# Patient Record
Sex: Female | Born: 1955 | Race: White | Hispanic: No | Smoking: Current some day smoker
Health system: Southern US, Community
[De-identification: ages and names within clinical notes are randomized; demographics above are authoritative.]

## PROBLEM LIST (undated history)

## (undated) DIAGNOSIS — I480 Paroxysmal atrial fibrillation: Secondary | ICD-10-CM

## (undated) DIAGNOSIS — K000 Anodontia: Secondary | ICD-10-CM

## (undated) DIAGNOSIS — I214 Non-ST elevation (NSTEMI) myocardial infarction: Secondary | ICD-10-CM

## (undated) DIAGNOSIS — J432 Centrilobular emphysema: Secondary | ICD-10-CM

## (undated) DIAGNOSIS — I10 Essential (primary) hypertension: Secondary | ICD-10-CM

## (undated) DIAGNOSIS — I255 Ischemic cardiomyopathy: Secondary | ICD-10-CM

## (undated) DIAGNOSIS — E785 Hyperlipidemia, unspecified: Secondary | ICD-10-CM

## (undated) DIAGNOSIS — IMO0002 Reserved for concepts with insufficient information to code with codable children: Secondary | ICD-10-CM

## (undated) DIAGNOSIS — K08109 Complete loss of teeth, unspecified cause, unspecified class: Secondary | ICD-10-CM

## (undated) DIAGNOSIS — M1990 Unspecified osteoarthritis, unspecified site: Secondary | ICD-10-CM

## (undated) HISTORY — PX: KNEE ARTHROSCOPY: SUR90

## (undated) HISTORY — DX: Non-ST elevation (NSTEMI) myocardial infarction: I21.4

## (undated) HISTORY — PX: TONSILLECTOMY: SUR1361

## (undated) HISTORY — DX: Paroxysmal atrial fibrillation: I48.0

## (undated) HISTORY — DX: Hyperlipidemia, unspecified: E78.5

## (undated) HISTORY — DX: Ischemic cardiomyopathy: I25.5

---

## 2015-11-15 ENCOUNTER — Other Ambulatory Visit (HOSPITAL_COMMUNITY): Payer: Self-pay | Admitting: *Deleted

## 2015-11-15 DIAGNOSIS — R221 Localized swelling, mass and lump, neck: Secondary | ICD-10-CM

## 2015-11-18 ENCOUNTER — Ambulatory Visit (HOSPITAL_COMMUNITY)
Admission: RE | Admit: 2015-11-18 | Discharge: 2015-11-18 | Disposition: A | Discharge status: Home / Self Care | Payer: Self-pay | Source: Ambulatory Visit | Attending: *Deleted | Admitting: *Deleted

## 2015-11-18 DIAGNOSIS — R221 Localized swelling, mass and lump, neck: Secondary | ICD-10-CM | POA: Insufficient documentation

## 2015-12-09 ENCOUNTER — Other Ambulatory Visit (INDEPENDENT_AMBULATORY_CARE_PROVIDER_SITE_OTHER): Payer: Self-pay | Admitting: Otolaryngology

## 2015-12-09 ENCOUNTER — Other Ambulatory Visit (HOSPITAL_COMMUNITY)
Admission: RE | Admit: 2015-12-09 | Discharge: 2015-12-09 | Disposition: A | Discharge status: Home / Self Care | Payer: Medicaid Other | Source: Ambulatory Visit | Attending: Otolaryngology | Admitting: Otolaryngology

## 2015-12-09 ENCOUNTER — Ambulatory Visit (INDEPENDENT_AMBULATORY_CARE_PROVIDER_SITE_OTHER): Payer: Medicaid Other | Admitting: Otolaryngology

## 2015-12-09 DIAGNOSIS — R221 Localized swelling, mass and lump, neck: Secondary | ICD-10-CM

## 2015-12-09 DIAGNOSIS — D487 Neoplasm of uncertain behavior of other specified sites: Secondary | ICD-10-CM

## 2015-12-16 ENCOUNTER — Ambulatory Visit (HOSPITAL_COMMUNITY): Payer: Self-pay

## 2015-12-17 ENCOUNTER — Encounter (HOSPITAL_BASED_OUTPATIENT_CLINIC_OR_DEPARTMENT_OTHER): Payer: Self-pay | Admitting: *Deleted

## 2015-12-17 ENCOUNTER — Other Ambulatory Visit: Payer: Self-pay | Admitting: Otolaryngology

## 2015-12-17 ENCOUNTER — Ambulatory Visit (HOSPITAL_COMMUNITY)
Admission: RE | Admit: 2015-12-17 | Discharge: 2015-12-17 | Disposition: A | Discharge status: Home / Self Care | Payer: Medicaid Other | Source: Ambulatory Visit | Attending: Otolaryngology | Admitting: Otolaryngology

## 2015-12-17 DIAGNOSIS — J432 Centrilobular emphysema: Secondary | ICD-10-CM | POA: Diagnosis not present

## 2015-12-17 DIAGNOSIS — R221 Localized swelling, mass and lump, neck: Secondary | ICD-10-CM | POA: Insufficient documentation

## 2015-12-17 DIAGNOSIS — R918 Other nonspecific abnormal finding of lung field: Secondary | ICD-10-CM | POA: Insufficient documentation

## 2015-12-17 DIAGNOSIS — I82C11 Acute embolism and thrombosis of right internal jugular vein: Secondary | ICD-10-CM | POA: Diagnosis not present

## 2015-12-17 MED ORDER — IOHEXOL 300 MG/ML  SOLN
75.0000 mL | Freq: Once | INTRAMUSCULAR | Status: AC | PRN
  Administered 2015-12-17: 75 mL via INTRAVENOUS

## 2015-12-20 ENCOUNTER — Ambulatory Visit (HOSPITAL_COMMUNITY): Payer: Self-pay

## 2015-12-21 ENCOUNTER — Encounter (HOSPITAL_BASED_OUTPATIENT_CLINIC_OR_DEPARTMENT_OTHER)
Admission: RE | Disposition: A | Discharge status: Home / Self Care | Payer: Self-pay | Source: Ambulatory Visit | Attending: Otolaryngology

## 2015-12-21 ENCOUNTER — Ambulatory Visit (HOSPITAL_BASED_OUTPATIENT_CLINIC_OR_DEPARTMENT_OTHER): Payer: Medicaid Other | Admitting: Anesthesiology

## 2015-12-21 ENCOUNTER — Encounter (HOSPITAL_BASED_OUTPATIENT_CLINIC_OR_DEPARTMENT_OTHER): Payer: Self-pay | Admitting: *Deleted

## 2015-12-21 ENCOUNTER — Ambulatory Visit (HOSPITAL_BASED_OUTPATIENT_CLINIC_OR_DEPARTMENT_OTHER)
Admission: RE | Admit: 2015-12-21 | Discharge: 2015-12-21 | Disposition: A | Discharge status: Home / Self Care | Payer: Medicaid Other | Source: Ambulatory Visit | Attending: Otolaryngology | Admitting: Otolaryngology

## 2015-12-21 DIAGNOSIS — F17210 Nicotine dependence, cigarettes, uncomplicated: Secondary | ICD-10-CM | POA: Insufficient documentation

## 2015-12-21 DIAGNOSIS — I10 Essential (primary) hypertension: Secondary | ICD-10-CM | POA: Diagnosis not present

## 2015-12-21 DIAGNOSIS — C760 Malignant neoplasm of head, face and neck: Secondary | ICD-10-CM | POA: Diagnosis not present

## 2015-12-21 DIAGNOSIS — C770 Secondary and unspecified malignant neoplasm of lymph nodes of head, face and neck: Secondary | ICD-10-CM | POA: Diagnosis not present

## 2015-12-21 DIAGNOSIS — E041 Nontoxic single thyroid nodule: Secondary | ICD-10-CM | POA: Insufficient documentation

## 2015-12-21 DIAGNOSIS — R221 Localized swelling, mass and lump, neck: Secondary | ICD-10-CM | POA: Diagnosis present

## 2015-12-21 HISTORY — DX: Unspecified osteoarthritis, unspecified site: M19.90

## 2015-12-21 HISTORY — PX: PANENDOSCOPY: SHX2159

## 2015-12-21 HISTORY — PX: MASS BIOPSY: SHX5445

## 2015-12-21 SURGERY — BIOPSY, MASS, NECK
Anesthesia: General | Site: Throat | Laterality: Right

## 2015-12-21 MED ORDER — SUCCINYLCHOLINE CHLORIDE 20 MG/ML IJ SOLN
INTRAMUSCULAR | Status: DC | PRN
  Administered 2015-12-21: 100 mg via INTRAVENOUS

## 2015-12-21 MED ORDER — EPINEPHRINE HCL 1 MG/ML IJ SOLN
INTRAMUSCULAR | Status: AC
  Filled 2015-12-21: qty 1

## 2015-12-21 MED ORDER — LIDOCAINE-EPINEPHRINE 1 %-1:100000 IJ SOLN
INTRAMUSCULAR | Status: DC | PRN
  Administered 2015-12-21: 2 mL

## 2015-12-21 MED ORDER — LACTATED RINGERS IV SOLN
INTRAVENOUS | Status: DC
  Administered 2015-12-21 (×2): via INTRAVENOUS

## 2015-12-21 MED ORDER — DEXAMETHASONE SODIUM PHOSPHATE 4 MG/ML IJ SOLN
INTRAMUSCULAR | Status: DC | PRN
  Administered 2015-12-21 (×2): 10 mg via INTRAVENOUS

## 2015-12-21 MED ORDER — PROPOFOL 10 MG/ML IV BOLUS
INTRAVENOUS | Status: AC
  Filled 2015-12-21: qty 20

## 2015-12-21 MED ORDER — HEMOSTATIC AGENTS (NO CHARGE) OPTIME
TOPICAL | Status: DC | PRN
  Administered 2015-12-21: 1 via TOPICAL

## 2015-12-21 MED ORDER — ONDANSETRON HCL 4 MG/2ML IJ SOLN
INTRAMUSCULAR | Status: AC
  Filled 2015-12-21: qty 2

## 2015-12-21 MED ORDER — ONDANSETRON HCL 4 MG/2ML IJ SOLN
INTRAMUSCULAR | Status: DC | PRN
  Administered 2015-12-21: 4 mg via INTRAVENOUS

## 2015-12-21 MED ORDER — LIDOCAINE HCL (CARDIAC) 20 MG/ML IV SOLN
INTRAVENOUS | Status: AC
  Filled 2015-12-21: qty 10

## 2015-12-21 MED ORDER — FENTANYL CITRATE (PF) 100 MCG/2ML IJ SOLN
50.0000 ug | INTRAMUSCULAR | Status: DC | PRN
  Administered 2015-12-21: 100 ug via INTRAVENOUS

## 2015-12-21 MED ORDER — CEFAZOLIN SODIUM-DEXTROSE 2-3 GM-% IV SOLR
INTRAVENOUS | Status: DC | PRN
  Administered 2015-12-21: 2 g via INTRAVENOUS

## 2015-12-21 MED ORDER — PROPOFOL 10 MG/ML IV BOLUS
INTRAVENOUS | Status: DC | PRN
  Administered 2015-12-21: 150 mg via INTRAVENOUS
  Administered 2015-12-21: 50 mg via INTRAVENOUS

## 2015-12-21 MED ORDER — FENTANYL CITRATE (PF) 100 MCG/2ML IJ SOLN
INTRAMUSCULAR | Status: AC
  Filled 2015-12-21: qty 2

## 2015-12-21 MED ORDER — LIDOCAINE HCL (CARDIAC) 20 MG/ML IV SOLN
INTRAVENOUS | Status: DC | PRN
  Administered 2015-12-21: 80 mg via INTRAVENOUS

## 2015-12-21 MED ORDER — HYDROCODONE-ACETAMINOPHEN 7.5-325 MG PO TABS: 1.0000 | ORAL_TABLET | Freq: Once | ORAL | Status: DC | PRN

## 2015-12-21 MED ORDER — DEXAMETHASONE SODIUM PHOSPHATE 10 MG/ML IJ SOLN
INTRAMUSCULAR | Status: AC
  Filled 2015-12-21: qty 1

## 2015-12-21 MED ORDER — PHENYLEPHRINE HCL 10 MG/ML IJ SOLN
INTRAMUSCULAR | Status: DC | PRN
  Administered 2015-12-21 (×4): 80 ug via INTRAVENOUS

## 2015-12-21 MED ORDER — MIDAZOLAM HCL 2 MG/2ML IJ SOLN
INTRAMUSCULAR | Status: AC
  Filled 2015-12-21: qty 2

## 2015-12-21 MED ORDER — OXYCODONE-ACETAMINOPHEN 5-325 MG PO TABS: 1.0000 | ORAL_TABLET | ORAL | Status: DC | PRN

## 2015-12-21 MED ORDER — GLYCOPYRROLATE 0.2 MG/ML IJ SOLN: 0.2000 mg | Freq: Once | INTRAMUSCULAR | Status: DC | PRN

## 2015-12-21 MED ORDER — EPHEDRINE SULFATE 50 MG/ML IJ SOLN
INTRAMUSCULAR | Status: DC | PRN
  Administered 2015-12-21 (×2): 10 mg via INTRAVENOUS

## 2015-12-21 MED ORDER — PROMETHAZINE HCL 25 MG/ML IJ SOLN: 6.2500 mg | INTRAMUSCULAR | Status: DC | PRN

## 2015-12-21 MED ORDER — SCOPOLAMINE 1 MG/3DAYS TD PT72: 1.0000 | MEDICATED_PATCH | Freq: Once | TRANSDERMAL | Status: DC | PRN

## 2015-12-21 MED ORDER — FENTANYL CITRATE (PF) 100 MCG/2ML IJ SOLN: 25.0000 ug | INTRAMUSCULAR | Status: DC | PRN

## 2015-12-21 MED ORDER — MIDAZOLAM HCL 2 MG/2ML IJ SOLN
1.0000 mg | INTRAMUSCULAR | Status: DC | PRN
  Administered 2015-12-21: 2 mg via INTRAVENOUS

## 2015-12-21 SURGICAL SUPPLY — 78 items
ADAPTER TUBE FLEX ULTRASET (MISCELLANEOUS) ×4 IMPLANT
BENZOIN TINCTURE PRP APPL 2/3 (GAUZE/BANDAGES/DRESSINGS) IMPLANT
BLADE SURG 15 STRL LF DISP TIS (BLADE) ×2 IMPLANT
BLADE SURG 15 STRL SS (BLADE) ×2
CANISTER SUCT 1200ML W/VALVE (MISCELLANEOUS) IMPLANT
CLEANER CAUTERY TIP 5X5 PAD (MISCELLANEOUS) IMPLANT
CLIP TI MEDIUM 6 (CLIP) IMPLANT
CLIP TI WIDE RED SMALL 6 (CLIP) IMPLANT
CLOSURE WOUND 1/4X4 (GAUZE/BANDAGES/DRESSINGS)
CORDS BIPOLAR (ELECTRODE) IMPLANT
COVER BACK TABLE 60X90IN (DRAPES) ×4 IMPLANT
COVER MAYO STAND STRL (DRAPES) ×4 IMPLANT
DECANTER SPIKE VIAL GLASS SM (MISCELLANEOUS) IMPLANT
DRAIN JACKSON RD 7FR 3/32 (WOUND CARE) IMPLANT
DRAIN PENROSE 1/4X12 LTX STRL (WOUND CARE) IMPLANT
DRAIN TLS ROUND 10FR (DRAIN) IMPLANT
DRAPE U-SHAPE 76X120 STRL (DRAPES) ×4 IMPLANT
ELECT COATED BLADE 2.86 ST (ELECTRODE) ×4 IMPLANT
ELECT NEEDLE BLADE 2-5/6 (NEEDLE) IMPLANT
ELECT PAIRED SUBDERMAL (MISCELLANEOUS)
ELECT REM PT RETURN 9FT ADLT (ELECTROSURGICAL) ×4
ELECTRODE PAIRED SUBDERMAL (MISCELLANEOUS) IMPLANT
ELECTRODE REM PT RTRN 9FT ADLT (ELECTROSURGICAL) ×2 IMPLANT
EVACUATOR SILICONE 100CC (DRAIN) IMPLANT
FORCEPS BIPOLAR SPETZLER 8 1.0 (NEUROSURGERY SUPPLIES) IMPLANT
GAUZE SPONGE 4X4 16PLY XRAY LF (GAUZE/BANDAGES/DRESSINGS) IMPLANT
GLOVE BIO SURGEON STRL SZ7.5 (GLOVE) ×4 IMPLANT
GLOVE BIOGEL PI IND STRL 7.5 (GLOVE) ×2 IMPLANT
GLOVE BIOGEL PI INDICATOR 7.5 (GLOVE) ×2
GLOVE SURG SS PI 7.0 STRL IVOR (GLOVE) ×12 IMPLANT
GLOVE SURG SS PI 7.5 STRL IVOR (GLOVE) ×4 IMPLANT
GOWN STRL REUS W/ TWL LRG LVL3 (GOWN DISPOSABLE) ×8 IMPLANT
GOWN STRL REUS W/TWL LRG LVL3 (GOWN DISPOSABLE) ×8
GUARD TEETH (MISCELLANEOUS) ×4 IMPLANT
HEMOSTAT SURGICEL .5X2 ABSORB (HEMOSTASIS) ×4 IMPLANT
LIQUID BAND (GAUZE/BANDAGES/DRESSINGS) ×4 IMPLANT
LOCATOR NERVE 3 VOLT (DISPOSABLE) IMPLANT
MARKER SKIN DUAL TIP RULER LAB (MISCELLANEOUS) ×4 IMPLANT
NEEDLE HYPO 18GX1.5 BLUNT FILL (NEEDLE) IMPLANT
NEEDLE HYPO 25X1 1.5 SAFETY (NEEDLE) ×4 IMPLANT
NEEDLE PRECISIONGLIDE 27X1.5 (NEEDLE) IMPLANT
NS IRRIG 1000ML POUR BTL (IV SOLUTION) ×4 IMPLANT
PACK BASIN DAY SURGERY FS (CUSTOM PROCEDURE TRAY) ×4 IMPLANT
PAD CLEANER CAUTERY TIP 5X5 (MISCELLANEOUS)
PATTIES SURGICAL .5 X3 (DISPOSABLE) IMPLANT
PENCIL BUTTON HOLSTER BLD 10FT (ELECTRODE) ×4 IMPLANT
PIN SAFETY STERILE (MISCELLANEOUS) IMPLANT
PROBE NERVBE PRASS .33 (MISCELLANEOUS) IMPLANT
SHEARS HARMONIC 9CM CVD (BLADE) IMPLANT
SHEET MEDIUM DRAPE 40X70 STRL (DRAPES) ×4 IMPLANT
SLEEVE SCD COMPRESS KNEE MED (MISCELLANEOUS) ×4 IMPLANT
SOLUTION BUTLER CLEAR DIP (MISCELLANEOUS) ×4 IMPLANT
SPONGE GAUZE 2X2 8PLY STER LF (GAUZE/BANDAGES/DRESSINGS)
SPONGE GAUZE 2X2 8PLY STRL LF (GAUZE/BANDAGES/DRESSINGS) IMPLANT
SPONGE GAUZE 4X4 12PLY STER LF (GAUZE/BANDAGES/DRESSINGS) ×4 IMPLANT
STAPLER VISISTAT 35W (STAPLE) IMPLANT
STRIP CLOSURE SKIN 1/4X4 (GAUZE/BANDAGES/DRESSINGS) IMPLANT
SUCTION FRAZIER HANDLE 10FR (MISCELLANEOUS)
SUCTION TUBE FRAZIER 10FR DISP (MISCELLANEOUS) IMPLANT
SUT ETHILON 3 0 PS 1 (SUTURE) IMPLANT
SUT ETHILON 4 0 PS 2 18 (SUTURE) IMPLANT
SUT PROLENE 5 0 P 3 (SUTURE) IMPLANT
SUT SILK 3 0 TIES 17X18 (SUTURE)
SUT SILK 3-0 18XBRD TIE BLK (SUTURE) IMPLANT
SUT SILK 4 0 TIES 17X18 (SUTURE) IMPLANT
SUT VIC AB 3-0 FS2 27 (SUTURE) IMPLANT
SUT VIC AB 4-0 P-3 18XBRD (SUTURE) IMPLANT
SUT VIC AB 4-0 P3 18 (SUTURE)
SUT VIC AB 4-0 RB1 27 (SUTURE)
SUT VIC AB 4-0 RB1 27X BRD (SUTURE) IMPLANT
SUT VICRYL 4-0 PS2 18IN ABS (SUTURE) ×4 IMPLANT
SYR BULB 3OZ (MISCELLANEOUS) ×4 IMPLANT
SYR CONTROL 10ML LL (SYRINGE) ×4 IMPLANT
SYR TB 1ML LL NO SAFETY (SYRINGE) IMPLANT
TOWEL OR 17X24 6PK STRL BLUE (TOWEL DISPOSABLE) ×8 IMPLANT
TRAY DSU PREP LF (CUSTOM PROCEDURE TRAY) ×4 IMPLANT
TUBE CONNECTING 20'X1/4 (TUBING) ×1
TUBE CONNECTING 20X1/4 (TUBING) ×3 IMPLANT

## 2015-12-21 NOTE — Anesthesia Postprocedure Evaluation (Signed)
Anesthesia Post Note  Patient: Gina Frederick  Procedure(s) Performed: Procedure(s) (LRB): NECK MASS BIOPSY (Right) PANENDOSCOPY (N/A)  Patient location during evaluation: PACU Anesthesia Type: General Level of consciousness: awake and alert Pain management: pain level controlled Vital Signs Assessment: post-procedure vital signs reviewed and stable Respiratory status: spontaneous breathing, nonlabored ventilation and respiratory function stable Cardiovascular status: blood pressure returned to baseline and stable Postop Assessment: no signs of nausea or vomiting Anesthetic complications: no    Last Vitals:  Filed Vitals:   12/21/15 1225 12/21/15 1248  BP:  140/86  Pulse: 99 109  Temp:  36.5 C  Resp: 22 20    Last Pain: There were no vitals filed for this visit.               Korah Hufstedler A

## 2015-12-21 NOTE — H&P (Signed)
Cc: Large neck mass  HPI: The patient is a 60 y/o female who presents today for evaluation of a large right neck mass. The patient is seen in consultation requested by [facility] Department. The patient first noticed swelling along the right side of neck in November. The area has continued to increase in size since that time. The patient denies any pain but states the area tingles. She notes extreme fatigue but denies any weight loss. She is eating and drinking without difficulty. The patient has a 10 year history of tobacco use. She previously had a tonsillectomy. The patient had a neck ultrasound which showed a multilobulated soft tissue mass measuring 7.3cm.  The patient's review of systems (constitutional, eyes, ENT, cardiovascular, respiratory, GI, musculoskeletal, skin, neurologic, psychiatric, endocrine, hematologic, allergic) is noted in the ROS questionnaire.  It is reviewed with the patient.    Family health history: None.   Major events: Tonsillectomy, C-section,    .   Ongoing medical problems: Thyroid nodule, hypertension.   Social history: The patient is single. She denies the use of alcohol or illegal drug.She smokes 3 cigarettes a day.  Exam General: Communicates without difficulty, well nourished, no acute distress.. Head: Normocephalic, no evidence injury, no tenderness, facial buttresses intact without stepoff.. Eyes: No scleral icterus, conjunctivae clear.. Neuro: CN II exam reveals vision grossly intact.  No nystagmus at any point of gaze.. Ears: Auricles well formed without lesions.  Ear canals are intact without mass or lesion.  No erythema or edema is appreciated.  The TM is intact without fluid.. Nose: External evaluation reveals normal support and skin without lesions.  Dorsum is intact.  Anterior rhinoscopy reveals healthy pink mucosa over anterior aspect of inferior turbinates and intact septum.  No purulence noted. Oral:  Oral cavity and oropharynx are  intact, symmetric, without erythema or edema. Multiple dental caries. Mucosa is moist without lesions. Neck: Full range of motion without pain.  Large firm right neck mass is noted. Thyroid bed within normal limits to palpation.  Parotid glands and submandibular glands equal bilaterally without mass.  Trachea is midline.Neuro:  CN 2-12 grossly intact. Gait normal. Vestibular: No nystagmus at any point of gaze. The cerebellar examination is unremarkable.   Procedure:  Flexible Fiberoptic Laryngoscopy -- Risks, benefits, and alternatives of flexible endoscopy were explained to the patient.  Specific mention was made of the risk of throat numbness with difficulty swallowing, possible bleeding from the nose and mouth, and pain from the procedure.  The patient gave oral consent to proceed.  The nasal cavities were decongested and anesthetised with a combination of oxymetazoline and 4% lidocaine solution.  The flexible scope was inserted into the right nasal cavity and advanced towards the nasopharynx.  Visualized mucosa over the turbinates and septum were as described above.  Prominence was noted along the right side of the submucosa of the pharynx.  Oropharyngeal walls were without lesion, ulcer, or edema.  Hypopharynx was also without  lesion or edema.  Larynx was mobile without lesions. Supraglottic structures were free of edema, mass, and asymmetry.  True vocal folds were white without mass or lesion.  Base of tongue was within normal limits.  The patient tolerated the procedure well.   Procedure: Fine needle aspiration biopsy of the right neck mass. Indication: To obtain biopsy of the patient's large right neck mass Anesthesia: Local anesthesia with 1% lidocaine. Description: The patient is placed supine on the operating table. An approximately  firm mass is noted at the  right lateral neck. 1% lidocaine with 1:100,000 epinephrine is infused subcutaneously over the mass. After adequate local anesthesia is  achieved, a 18 gauge needle is used to make multiple passes through the mass.  The aspirated specimen is spread over four glass slides.  The slides are sent to the pathology department for histologic identification. The patient tolerated the procedure well.  Assessment 1.  Large firm right neck mass  which is noted to extend into the submucosa of the pharynx on laryngoscopy exam.  2.  No other suspicious mass or lesion is noted on today's fiberoptic laryngoscopy exam.  Plan  1.  The physical exam and laryngoscopy findings are reviewed with the patient.  2.  Fine needle aspiration is performed without difficulty.  3.  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 Plan open biopsy for definitive tissue diagnosis. Will also perform pan-endoscopy in OR.

## 2015-12-21 NOTE — Anesthesia Procedure Notes (Signed)
Procedure Name: Intubation Date/Time: 12/21/2015 10:58 AM Performed by: Maryella Shivers Pre-anesthesia Checklist: Patient identified, Emergency Drugs available, Suction available and Patient being monitored Patient Re-evaluated:Patient Re-evaluated prior to inductionOxygen Delivery Method: Circle System Utilized Preoxygenation: Pre-oxygenation with 100% oxygen Intubation Type: IV induction Ventilation: Mask ventilation without difficulty Grade View: Grade III Tube type: Oral Tube size: 6.5 mm Number of attempts: 1 Airway Equipment and Method: Stylet and Oral airway Placement Confirmation: ETT inserted through vocal cords under direct vision,  positive ETCO2 and breath sounds checked- equal and bilateral Secured at: 20 cm Tube secured with: Tape Dental Injury: Teeth and Oropharynx as per pre-operative assessment  Comments: Trachea deviated left

## 2015-12-21 NOTE — Discharge Instructions (Addendum)
The patient may resume her previous activities and diet. The patient will follow up in my [place] office in one week.    Post Anesthesia Home Care Instructions  Activity: Get plenty of rest for the remainder of the day. A responsible adult should stay with you for 24 hours following the procedure.  For the next 24 hours, DO NOT: -Drive a car -Paediatric nurse -Drink alcoholic beverages -Take any medication unless instructed by your physician -Make any legal decisions or sign important papers.  Meals: Start with liquid foods such as gelatin or soup. Progress to regular foods as tolerated. Avoid greasy, spicy, heavy foods. If nausea and/or vomiting occur, drink only clear liquids until the nausea and/or vomiting subsides. Call your physician if vomiting continues.  Special Instructions/Symptoms: Your throat may feel dry or sore from the anesthesia or the breathing tube placed in your throat during surgery. If this causes discomfort, gargle with warm salt water. The discomfort should disappear within 24 hours.  If you had a scopolamine patch placed behind your ear for the management of post- operative nausea and/or vomiting:  1. The medication in the patch is effective for 72 hours, after which it should be removed.  Wrap patch in a tissue and discard in the trash. Wash hands thoroughly with soap and water. 2. You may remove the patch earlier than 72 hours if you experience unpleasant side effects which may include dry mouth, dizziness or visual disturbances. 3. Avoid touching the patch. Wash your hands with soap and water after contact with the patch.

## 2015-12-21 NOTE — Transfer of Care (Signed)
Immediate Anesthesia Transfer of Care Note  Patient: Gina Frederick  Procedure(s) Performed: Procedure(s): NECK MASS BIOPSY (Right) PANENDOSCOPY (N/A)  Patient Location: PACU  Anesthesia Type:General  Level of Consciousness: awake, alert  and oriented  Airway & Oxygen Therapy: Patient Spontanous Breathing and Patient connected to face mask oxygen  Post-op Assessment: Report given to RN and Post -op Vital signs reviewed and stable  Post vital signs: Reviewed and stable  Last Vitals:  Filed Vitals:   12/21/15 0932  BP: 132/76  Pulse: 107  Temp: 36.7 C  Resp: 20    Complications: No apparent anesthesia complications

## 2015-12-21 NOTE — Anesthesia Preprocedure Evaluation (Addendum)
Anesthesia Evaluation  Patient identified by MRN, date of birth, ID band Patient awake    Reviewed: Allergy & Precautions, H&P , NPO status , Patient's Chart, lab work & pertinent test results  History of Anesthesia Complications Negative for: history of anesthetic complications  Airway Mallampati: II  TM Distance: >3 FB Neck ROM: full    Dental no notable dental hx. (+) Poor Dentition, Dental Advisory Given   Pulmonary Current Smoker,    Pulmonary exam normal breath sounds clear to auscultation       Cardiovascular hypertension, Pt. on medications Normal cardiovascular exam Rhythm:regular Rate:Normal     Neuro/Psych negative neurological ROS     GI/Hepatic negative GI ROS, Neg liver ROS,   Endo/Other  negative endocrine ROS  Renal/GU negative Renal ROS     Musculoskeletal  (+) Arthritis ,   Abdominal   Peds  Hematology negative hematology ROS (+)   Anesthesia Other Findings   Reproductive/Obstetrics negative OB ROS                            Anesthesia Physical Anesthesia Plan  ASA: II  Anesthesia Plan: General   Post-op Pain Management:    Induction: Intravenous  Airway Management Planned: Oral ETT  Additional Equipment:   Intra-op Plan:   Post-operative Plan: Extubation in OR  Informed Consent: I have reviewed the patients History and Physical, chart, labs and discussed the procedure including the risks, benefits and alternatives for the proposed anesthesia with the patient or authorized representative who has indicated his/her understanding and acceptance.   Dental Advisory Given  Plan Discussed with: Anesthesiologist, CRNA and Surgeon  Anesthesia Plan Comments:         Anesthesia Quick Evaluation

## 2015-12-21 NOTE — Op Note (Signed)
DATE OF PROCEDURE:  12/21/2015                              OPERATIVE REPORT  SURGEON:  Leta Baptist, MD  PREOPERATIVE DIAGNOSES: 1. Malignant right neck mass  POSTOPERATIVE DIAGNOSES: 1. Malignant right neck mass  PROCEDURE PERFORMED:   1. Open biopsy of a large right malignant neck mass 2. Direct laryngoscopy. 3. Rigid esophagoscopy 4. Bronchoscopy                                                     ANESTHESIA:  General endotracheal tube anesthesia.  COMPLICATIONS:  None.  ESTIMATED BLOOD LOSS:  Minimal.  INDICATION FOR PROCEDURE:  Candence Berges is a 60 y.o. female with a history of a rapidly enlarging right neck mass. She underwent fine-needle aspiration biopsy in my office. The result was consistent with malignancy. Open biopsy was recommended to obtain tissue diagnosis. Her CT scan showed no obvious primary lesion. Based on the above findings, the decision was made for patient to undergo the above-stated procedures. The risks, benefits, alternatives, and details of the procedure were discussed with the mother.  Questions were invited and answered.  Informed consent was obtained.  DESCRIPTION:  The patient was taken to the operating room and placed supine on the operating table.  General endotracheal tube anesthesia was administered by the anesthesiologist.  The patient was positioned and prepped and draped in a standard fashion for panendoscopy.   A Dedo laryngoscope was used for the laryngoscopy examination. The laryngoscope was inserted via the oral cavity into the pharynx. The vallecula, piriform sinuses, arytenoids, and true and false vocal cords were all free of lesions or ulceration. Moderate diffuse edema was noted within the hypopharynx and larynx. The Dedo laryngoscope was withdrawn.  A rigid esophagoscope was then inserted via the oral cavity into the esophageal inlet. Examination of the esophageal mucosa showed no obvious lesion. The rigid esophagoscope was removed.  A  flexible bronchoscope was inserted through the endotracheal tube into the trachea. Examination of the trachea, carina, mainstem bronchi, and segmental takeoffs were all normal.  Attention was then focused on the large right neck mass. 1% lidocaine with 1-100,000 epinephrine was infiltrated at the planned site of incision. A transverse incision was made over the right level II neck mass. The incision was carried down to the level of the large neck mass. 3 large specimens were obtained. The specimens were sent to the pathology department for histologic identification. The surgical site was copiously irrigated. Hemostasis was achieved. The incision was closed in layers with 4-0 Vicryl and Dermabond.  The care of the patient was turned over to the anesthesiologist.  The patient was awakened from anesthesia without difficulty.  She was extubated and transferred to the recovery room in good condition.  OPERATIVE FINDINGS:  A large 7 cm right neck mass was noted. No obvious primary lesion was noted on today's panendoscopy examination.  SPECIMEN:  Right neck mass  FOLLOWUP CARE:  The patient will be discharged home once awake and alert. The patient will follow up in my office in approximately 1 week.  Ascencion Dike 12/21/2015 11:55 AM

## 2015-12-22 ENCOUNTER — Encounter (HOSPITAL_BASED_OUTPATIENT_CLINIC_OR_DEPARTMENT_OTHER): Payer: Self-pay | Admitting: Otolaryngology

## 2015-12-28 ENCOUNTER — Encounter (HOSPITAL_COMMUNITY): Payer: Medicaid Other | Attending: Hematology & Oncology | Admitting: Hematology & Oncology

## 2015-12-28 ENCOUNTER — Encounter (HOSPITAL_COMMUNITY): Payer: Self-pay | Admitting: Hematology & Oncology

## 2015-12-28 VITALS — BP 133/80 | HR 81 | Temp 98.4°F | Resp 18 | Ht 64.0 in | Wt 153.6 lb

## 2015-12-28 DIAGNOSIS — Z23 Encounter for immunization: Secondary | ICD-10-CM

## 2015-12-28 DIAGNOSIS — I82C11 Acute embolism and thrombosis of right internal jugular vein: Secondary | ICD-10-CM

## 2015-12-28 DIAGNOSIS — J432 Centrilobular emphysema: Secondary | ICD-10-CM | POA: Diagnosis not present

## 2015-12-28 DIAGNOSIS — C760 Malignant neoplasm of head, face and neck: Secondary | ICD-10-CM | POA: Diagnosis not present

## 2015-12-28 DIAGNOSIS — Z720 Tobacco use: Secondary | ICD-10-CM | POA: Insufficient documentation

## 2015-12-28 DIAGNOSIS — I10 Essential (primary) hypertension: Secondary | ICD-10-CM | POA: Insufficient documentation

## 2015-12-28 DIAGNOSIS — C4442 Squamous cell carcinoma of skin of scalp and neck: Secondary | ICD-10-CM | POA: Insufficient documentation

## 2015-12-28 MED ORDER — INFLUENZA VAC SPLIT QUAD 0.5 ML IM SUSY
PREFILLED_SYRINGE | INTRAMUSCULAR | Status: AC
  Filled 2015-12-28: qty 0.5

## 2015-12-28 MED ORDER — INFLUENZA VAC SPLIT QUAD 0.5 ML IM SUSY
0.5000 mL | PREFILLED_SYRINGE | Freq: Once | INTRAMUSCULAR | Status: AC
  Administered 2015-12-28: 0.5 mL via INTRAMUSCULAR

## 2015-12-28 NOTE — Progress Notes (Signed)
Doral Njoku had flu vaccine today

## 2015-12-28 NOTE — Progress Notes (Signed)
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 Benjamine Mola 12/21/2015 No obvious primary lesion noted on panendoscopy examination CT neck 12/17/2015 large infiltrative tumor mass extends to skin surface, infiltrates R patorid, R submandibular, R carotid sheath,Thrombosis of R IJ vein, multiple satellite nodules Centrilobular emphysema  HISTORY OF PRESENTING ILLNESS:  Gina Frederick 60 y.o. female is here on referral from Dr. 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   She is here today with a man named Herbie Baltimore. 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 Ms. Perlberg lives with the family she cares for.  She notes that she was sent to Dr. Benjamine Mola on [facility].  Ms. Usrey comments that Dr. Benjamine Mola "had taken tissue samples." After that, she had her CT done, and had "day surgery" at [facility] in [place]. She is aware that she had scopes done, and is aware that she has cancer. She notes that Dr. Benjamine Mola "basically said she'd be coming here," to [facility], and that "he thought it would be radiology or radiation type treatment." He did tell her it was cancer. She also notes that Dr. Benjamine Mola "did mention something about a lymph node," and knows that her mass can't be cut out because it has wrapped itself around vital structures in her neck.  When asked about recent symptoms, she says she feels like she's lost weight, though "there are no scales in the house." She says that ever since before Christmas, she feels she's dropped about ten pounds, gesturing to herself and noting "I can just tell it through here." Regarding appetite and eating habits, she notes that she has bad teeth, which makes it hard  for her to eat. She adds that she's also been nauseous, and often doesn't have an appetite, describing this as "one day I will have an appetite, but then the next couple of days, I won't want to eat."  In terms of pain in her neck mass, she remarks "It's really not that painful, it's just more awkward," commenting "it's just so hard to sleep" because "it's just awkward, and it's just there."  She denies noticing any unusual pain. When asked about any new cough, she notes a "light cough," which she says she was advised would happen with her blood pressure medication. This cough began in January.  Ms. Vanhoozer denies any trouble urinating, denies any trouble with her bowels, and denies any problems with tingling or numbness in her hands or feet.  In terms of headaches and dizziness, she says that she was experiencing some light-headedness back around when she discovered that her blood pressure was elevated.  She says she called the [facility] at the first of December, but they couldn't see any new patients until January 9th, which was the day she finally got in. She confirms that she hasn't had long-term healthcare in a while, indicating that this is due to insurance issues. She cannot remember the last time she had a mammogram.  When asked how she's handling everything, she confirms she's overwhelmed; that "it hits her sometimes," but she's trying to keep her chin up. She says "I'd do better if my sister wouldn't keep crying every time she talks to me."  She confirms that she  is okay with her sister or her daughter calling up to the clinic and wanting to talk to anyone here.  She presents today for additional discussion, recommendations and treatment options.   MEDICAL HISTORY:  Past Medical History  Diagnosis Date  . Hypertension   . Arthritis     hands  . Neck mass     right    SURGICAL HISTORY: Past Surgical History  Procedure Laterality Date  . Tonsillectomy    . Knee  arthroscopy      right  . Cesarean section    . Mass biopsy Right 12/21/2015    Procedure: NECK MASS BIOPSY;  Surgeon: Leta Baptist, MD;  Location: [place];  Service: ENT;  Laterality: Right;  . Panendoscopy N/A 12/21/2015    Procedure: PANENDOSCOPY;  Surgeon: Leta Baptist, MD;  Location: [place];  Service: ENT;  Laterality: N/A;    SOCIAL HISTORY: Social History   Social History  . Marital Status: Divorced    Spouse Name: N/A  . Number of Children: N/A  . Years of Education: N/A   Occupational History  . Not on file.   Social History Main Topics  . Smoking status: Current Every Day Smoker -- 0.25 packs/day    Types: Cigarettes  . Smokeless tobacco: Not on file  . Alcohol Use: No  . Drug Use: No  . Sexual Activity: No   Other Topics Concern  . Not on file   Social History Narrative   She is divorced. One daughter, no grandchildren Smokes, notes that she is down to 3 a day. Began smoking "probably in her 74's." She says she has quit "on and off through the years." Denies problems with EtOH. 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 Quit working when her daughter was "of age to go to school," then stayed home with her daughter. For hobbies, she says she likes to read.  Is currently the caregiver for a lady with diabetes.  She does light housekeeping and keeps her on her medications, and cooks. The son of the woman she cares for, Herbie Baltimore, notes that her good cooking has brought his momma's blood pressure and blood sugar down.  She notes she has a good support system with her sister, and with the son of the woman she takes care of.  FAMILY HISTORY: Family History  Problem Relation Age of Onset  . Alzheimer's disease Mother   . Congestive Heart Failure Father    indicated that her mother is deceased. She indicated that her father is deceased.   Mother and father are  deceased. Mother was 50; died of Alzheimer's. Father was 66; congestive heart failure. Has a sister, age 23 this year. Had a brother, deceased. He took his own life. 4 nieces and nephews.  ALLERGIES:  is allergic to codeine.  MEDICATIONS:  Current Outpatient Prescriptions  Medication Sig Dispense Refill  . lisinopril (PRINIVIL,ZESTRIL) 20 MG tablet Take 20 mg by mouth daily.    Marland Kitchen oxyCODONE-acetaminophen (ROXICET) 5-325 MG tablet Take 1 tablet by mouth every 4 (four) hours as needed for severe pain. 20 tablet 0   No current facility-administered medications for this visit.    Review of Systems  Constitutional: Positive for weight loss.       Lost about 10 pounds since Christmas, by her reckoning.  HENT: Negative.   Eyes: Negative.   Respiratory: Positive for cough.        "  Light cough" since starting her blood pressure medication.  Cardiovascular: Negative.   Gastrointestinal: Negative.   Genitourinary: Negative.   Musculoskeletal: Negative.   Skin: Negative.   Neurological: Negative.   Endo/Heme/Allergies: Negative.   Psychiatric/Behavioral: Negative.   All other systems reviewed and are negative.  14 point ROS was done and is otherwise as detailed above or in HPI    PHYSICAL EXAMINATION: ECOG PERFORMANCE STATUS: 1 - Symptomatic but completely ambulatory  Filed Vitals:   12/28/15 1435  BP: 133/80  Pulse: 81  Temp: 98.4 F (36.9 C)  Resp: 18   Filed Weights   12/28/15 1435  Weight: 153 lb 9.6 oz (69.673 kg)    Physical Exam  Constitutional: She is oriented to person, place, and time and well-developed, well-nourished, and in no distress.  HENT:  Head: Normocephalic and atraumatic.  Nose: Nose normal.  Mouth/Throat: Oropharynx is clear and moist. No oropharyngeal exudate.  Limited as to how far she can open her mouth, due to her mass.  Eyes: Conjunctivae and EOM are normal. Pupils are equal, round, and reactive to light. Right eye exhibits no discharge.  Left eye exhibits no discharge. No scleral icterus.  Neck: Normal range of motion. Neck supple. No tracheal deviation present. No thyromegaly present.  Large R sided neck mass that begins just lateral to the thyroid cartilage, approximately 8 cm in greatest dimension, small tumor extension through the skin, well healed surgical incision site noted. Tumor extends to the base of the mandible  Cardiovascular: Normal rate, regular rhythm and normal heart sounds.  Exam reveals no gallop and no friction rub.   No murmur heard. Pulmonary/Chest: Effort normal and breath sounds normal. She has no wheezes. She has no rales.  Abdominal: Soft. Bowel sounds are normal. She exhibits no distension and no mass. There is no tenderness. There is no rebound and no guarding.  Musculoskeletal: Normal range of motion. She exhibits no edema.  Lymphadenopathy:    She has no cervical adenopathy.  Neurological: She is alert and oriented to person, place, and time. She has normal reflexes. No cranial nerve deficit. Gait normal. Coordination normal.  Skin: Skin is warm and dry. No rash noted.  Psychiatric: Mood, memory, affect and judgment normal.  Nursing note and vitals reviewed.   LABORATORY DATA:  I have reviewed the data as listed  No results found for: WBC, HGB, HCT, MCV, PLT CMP  No results found for: NA, K, CL, CO2, GLUCOSE, BUN, CREATININE, CALCIUM, PROT, ALBUMIN, AST, ALT, ALKPHOS, BILITOT, GFRNONAA, GFRAA   RADIOGRAPHIC STUDIES: I have personally reviewed the radiological images as listed and agreed with the findings in the report.  Ct Soft Tissue Neck W Contrast  12/17/2015  CLINICAL DATA:  Right-sided neck mass. Poorly differentiated cancer. EXAM: CT NECK WITH CONTRAST TECHNIQUE: Multidetector CT imaging of the neck was performed using the standard protocol following the bolus administration of intravenous contrast. CONTRAST:  6mL OMNIPAQUE IOHEXOL 300 MG/ML  SOLN COMPARISON:  Thyroid ultrasound  11/18/2015. FINDINGS: Pharynx and larynx: Mild diffuse edema is present within the hypo pharyngeal mucosa. No discrete mass lesion is evident. The tongue base is within normal limits. The vocal cords are midline and symmetric. Salivary glands: The left submandibular and parotid gland are within normal limits. The right submandibular and parotid glands are inflated by a very large heterogeneous in peripherally enhancing infiltrative mass lesion of the right neck. Thyroid: A multinodular goiter is again seen. Lymph nodes: There is a peripherally enhancing infiltrative right neck mass  measures 7.2 x 5.4 x 7.1 cm. The right internal jugular vein is thrombosed. Multiple additional satellite nodules are noted. A right level 4 nodule measures 1.7 cm. A supra clavicular nodule on the right measures 1.6 cm. A lateral right level 2 node measures 1.1 cm. A right sub mandibular nodule measures 1.8 cm. Bilateral submental rounded nodes are present. There is a hyperdense left submandibular node measuring 1.7 x 7 1.7 x 0.7 cm. A benign appearing jugulodigastric lymph node is present on the left. A rounded left level 4 node measures 11 mm. Vascular: The right internal jugular vein is thrombosed below the level of the mass. The right internal carotid artery is deviated medially by the mass. There is tumor infiltration of the carotid sheath. Atherosclerotic changes are present bilaterally without significant stenosis. Limited intracranial: Unremarkable Visualized orbits: Within normal limits. Mastoids and visualized paranasal sinuses: Mild mucosal thickening is present in the inferior maxillary sinuses bilaterally, right greater than left. The remaining paranasal sinuses and the mastoid air cells are clear. Skeleton: Typical degenerative changes are present in the cervical spine from C3-4 through C6-7. Uncovertebral spurring is present bilaterally, greatest at C5-6. No focal lytic or blastic lesions are present. Upper chest:  Centrilobular emphysematous changes are noted at the lung apices bilaterally. An ill-defined nodule at the right lung apex measures 8 mm the left lung is clear. The upper mediastinum is unremarkable. IMPRESSION: 1. Large infiltrative tumor mass extends to the skin surface with dermal thickening. The lesion infiltrates the right parotid gland, the right submandibular gland, the right carotid sheath, the right submandibular space, and extends to the thyroid cartilage on the right. 2. Thrombosis of the right internal jugular vein. 3. Multiple satellite nodules extend from the supraclavicular space to the right submandibular space. 4. Bilateral submental lymph nodes are concerning for tumor. 5. Rounded left level 4 lymph node is concerning for tumor. 6. Centrilobular emphysema. 7. Indeterminate 8 mm ground-glass nodule at the right lung apex. CT the chest with contrast could be utilized for evaluation of additional lung nodules if clinically indicated. Electronically Signed   By: San Morelle M.D.   On: 12/17/2015 21:03     11/18/2015   Study Result     CLINICAL DATA: RIGHT neck mass enlarging since November 2016  EXAM: ULTRASOUND OF HEAD/NECK SOFT TISSUES  TECHNIQUE: Ultrasound examination of the head and neck soft tissues was performed in the area of clinical concern.  COMPARISON: None  FINDINGS: RIGHT thyroid lobe 4.2 x 2.4 x 1.3 cm.  LEFT thyroid lobe 4.6 x 1.6 x 1.4 cm.  Both thyroid lobes demonstrate heterogeneous echogenicity.  Several tiny nodules are identified within both thyroid lobes, largest 11 x 10 x 6 mm at upper pole LEFT lobe.  Thyroid lobes appear unrelated to the large palpable mass at the superolateral RIGHT neck.  At the site of clinical concern, a multilobulated soft tissue mass is identified measuring 7.3 x 5.2 x 4.7 cm in size, located laterally anterior to the RIGHT carotid artery and jugular vein.  Mass demonstrates heterogeneous internal  echogenicity, lobulated irregular borders, and lacks definite calcifications.  Internal blood flow present within the mass on color Doppler imaging.  Multiple additional hypoechoic nodules are seen regionally in the upper lateral RIGHT neck most consistent with adenopathy, measuring up to 17 mm short axis.  Several of these nodes contain fatty hila but additional nodules lack normal nodal architecture.  IMPRESSION: Multilobulated irregular mass in the superolateral RIGHT neck corresponding to palpable abnormality, 7.3  x 5.2 x 4.7 cm in size with additional adjacent enlarged lymph nodes.  Finding may represent tumor, enlarged abnormal lymph nodes such as from lymphoma or metastatic disease, less likely be related to a salivary origin.  Tissue diagnosis recommended.  Nonspecific thyroid lobes with small thyroid nodules, recommendation below.  Findings do not meet current SRU consensus criteria for biopsy. Follow-up by clinical exam is recommended. If patient has known risk factors for thyroid carcinoma, consider follow-up ultrasound in 12 months. If patient is clinically hyperthyroid, consider nuclear medicine thyroid uptake and scan.Reference: Management of Thyroid Nodules Detected at [place]: Society of Radiologists in [place]. Radiology 2005; [id number].   Electronically Signed  By: Lavonia Dana M.D.  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 Benjamine Mola 12/21/2015 No obvious primary lesion noted on panendoscopy examination CT neck 12/17/2015 large infiltrative tumor mass extends to skin surface, infiltrates R patorid, R submandibular, R carotid sheath,Thrombosis of R IJ vein, multiple satellite nodules Centrilobular emphysema  Today, the patient was advised about her squamous cell cancer.  I have recommended additional imaging given the advanced nature of her disease including a CT of the chest/abdomen/pelvis and a bone scan.  Transportation is an issue and we will have to address this moving forward as she will need palliative XRT to the neck mass. I have contacted Dr. Isidore Moos and will have her review the patient's CT scan and arrange for consultation.  I suspect this is a head and neck primary, if additional imaging confuses the issue I may consider Cancer Type ID or Next Gen testing.  We will have Ms. Totten meet with Hildred Alamin today for teaching. She will meet Abby Potash next time she's in. At some point, she will need to meet Ovid Curd to consult about nutrition.  She will return as soon as the results of her imaging are back. We will schedule her follow-up appointment for mid-morning, as per her transportation's request.  Additional recommendations to follow.   Orders Placed This Encounter  Procedures  . CT Abdomen Pelvis W Contrast    Standing Status: Future     Number of Occurrences:      Standing Expiration Date: 12/27/2016    Order Specific Question:  If indicated for the ordered procedure, I authorize the administration of contrast media per Radiology protocol    Answer:  Yes    Order Specific Question:  Reason for Exam (SYMPTOM  OR DIAGNOSIS REQUIRED)    Answer:  squamous cell carcinoma staging    Order Specific Question:  Is the patient pregnant?    Answer:  No    Order Specific Question:  Preferred imaging location?    Answer:  [facility]  . CT Chest W Contrast    Standing Status: Future     Number of Occurrences:      Standing Expiration Date: 12/27/2016    Order Specific Question:  If indicated for the ordered procedure, I authorize the administration of contrast media per Radiology protocol    Answer:  Yes    Order Specific Question:  Reason for Exam (SYMPTOM  OR DIAGNOSIS REQUIRED)    Answer:  squamous cell carcinoma staging    Order Specific Question:  Is  the patient pregnant?    Answer:  No    Order Specific Question:  Preferred imaging location?    Answer:  [facility] [address] [address]  .  NM Bone Scan Whole Body    Standing Status: Future     Number of Occurrences:      Standing Expiration Date: 12/27/2016    Order Specific Question:  Reason for Exam (SYMPTOM  OR DIAGNOSIS REQUIRED)    Answer:  squamous cell carcinoma staging    Order Specific Question:  Is the patient pregnant?    Answer:  No    Order Specific Question:  Preferred imaging location?    Answer:  [facility]    Order Specific Question:  If indicated for the ordered procedure, I authorize the administration of a radiopharmaceutical per Radiology protocol    Answer:  Yes   All questions were answered. The patient knows to call the clinic with any problems, questions or concerns.  This document serves as a record of services personally performed by Ancil Linsey, MD. It was created on her behalf by Toni Amend, a trained medical scribe. The creation of this record is based on the scribe's personal observations and the provider's statements to them. This document has been checked and approved by the attending provider.  I have reviewed the above documentation for accuracy and completeness, and I agree with the above.  This note was electronically signed.    Molli Hazard, MD  12/28/2015 4:18 PM

## 2015-12-28 NOTE — Patient Instructions (Addendum)
[place] at [facility] Discharge Instructions  RECOMMENDATIONS MADE BY THE CONSULTANT AND ANY TEST RESULTS WILL BE SENT TO YOUR REFERRING PHYSICIAN.   Exam and discussion by Dr Whitney Muse today. Squamous cell cancer.  We need to more pictures to get a better look at your body. We need to make sure that this is not anywhere else. You will need to be seen by radiation oncology.  Dr Isidore Moos is the radiation oncologist that does the head/neck region.  Loren Racer is our Education officer, museum. She is here on Tuesdays. Burtis Junes is our nutritionist here at [facility]. Lupita Raider is our nurse navigator.  She is a good resource if you have any questions when you start your journey here in our dept.  Her number is [phone]. Return to see the doctor in after your scans Please call the clinic if you have any questions or concerns     Thank you for choosing [place] at [facility] to provide your oncology and hematology care.  To afford each patient quality time with our provider, please arrive at least 15 minutes before your scheduled appointment time.   Beginning January 23rd 2017 lab work for the [name] will be done in the  Main lab at [facility] on 1st floor. If you have a lab appointment with the [facility] please come in thru the  Main Entrance and check in at the main information desk  You need to re-schedule your appointment should you arrive 10 or more minutes late.  We strive to give you quality time with our providers, and arriving late affects you and other patients whose appointments are after yours.  Also, if you no show three or more times for appointments you may be dismissed from the clinic at the providers discretion.     Again, thank you for choosing [facility].  Our hope is that these requests will decrease the amount of time that you wait before being seen by our physicians.        _____________________________________________________________  Should you have questions after your visit to [facility], please contact our office at [phone] between the hours of 8:30 a.m. and 4:30 p.m.  Voicemails left after 4:30 p.m. will not be returned until the following business day.  For prescription refill requests, have your pharmacy contact our office.

## 2015-12-30 ENCOUNTER — Ambulatory Visit (INDEPENDENT_AMBULATORY_CARE_PROVIDER_SITE_OTHER): Payer: Self-pay | Admitting: Otolaryngology

## 2016-01-03 ENCOUNTER — Ambulatory Visit (HOSPITAL_COMMUNITY)
Admission: RE | Admit: 2016-01-03 | Discharge: 2016-01-03 | Disposition: A | Discharge status: Home / Self Care | Payer: Medicaid Other | Source: Ambulatory Visit | Attending: Hematology & Oncology | Admitting: Hematology & Oncology

## 2016-01-03 DIAGNOSIS — K8020 Calculus of gallbladder without cholecystitis without obstruction: Secondary | ICD-10-CM | POA: Insufficient documentation

## 2016-01-03 DIAGNOSIS — C760 Malignant neoplasm of head, face and neck: Secondary | ICD-10-CM | POA: Insufficient documentation

## 2016-01-03 DIAGNOSIS — J439 Emphysema, unspecified: Secondary | ICD-10-CM | POA: Diagnosis not present

## 2016-01-03 DIAGNOSIS — R221 Localized swelling, mass and lump, neck: Secondary | ICD-10-CM | POA: Insufficient documentation

## 2016-01-03 DIAGNOSIS — I700 Atherosclerosis of aorta: Secondary | ICD-10-CM | POA: Insufficient documentation

## 2016-01-03 DIAGNOSIS — R911 Solitary pulmonary nodule: Secondary | ICD-10-CM | POA: Diagnosis not present

## 2016-01-03 MED ORDER — IOHEXOL 300 MG/ML  SOLN
100.0000 mL | Freq: Once | INTRAMUSCULAR | Status: AC | PRN
  Administered 2016-01-03: 100 mL via INTRAVENOUS

## 2016-01-04 ENCOUNTER — Encounter (HOSPITAL_COMMUNITY)
Admission: RE | Admit: 2016-01-04 | Discharge: 2016-01-04 | Disposition: A | Discharge status: Home / Self Care | Payer: Medicaid Other | Source: Ambulatory Visit | Attending: Hematology & Oncology | Admitting: Hematology & Oncology

## 2016-01-04 ENCOUNTER — Encounter: Payer: Self-pay | Admitting: *Deleted

## 2016-01-04 ENCOUNTER — Encounter (HOSPITAL_COMMUNITY): Payer: Self-pay

## 2016-01-04 DIAGNOSIS — C760 Malignant neoplasm of head, face and neck: Secondary | ICD-10-CM | POA: Diagnosis not present

## 2016-01-04 DIAGNOSIS — C4442 Squamous cell carcinoma of skin of scalp and neck: Secondary | ICD-10-CM

## 2016-01-04 MED ORDER — TECHNETIUM TC 99M MEDRONATE IV KIT
25.0000 | PACK | Freq: Once | INTRAVENOUS | Status: AC | PRN
  Administered 2016-01-04: 25 via INTRAVENOUS

## 2016-01-04 NOTE — Progress Notes (Signed)
[place] Clinical Social Work  Clinical Social Work was referred by Futures trader for assessment of psychosocial needs due to possible transportation concerns. Pt also does not appear to have insurance and will need assistance with connecting to community resources. Clinical Social Worker left a message for pt to return her call. Pt really needs to see financial counselor at next appointment.    Loren Racer, [place] Tuesdays   [phone]

## 2016-01-05 ENCOUNTER — Encounter (HOSPITAL_COMMUNITY): Payer: Self-pay | Admitting: Hematology & Oncology

## 2016-01-05 ENCOUNTER — Encounter (HOSPITAL_COMMUNITY): Payer: Self-pay | Admitting: Lab

## 2016-01-05 ENCOUNTER — Encounter (HOSPITAL_COMMUNITY): Payer: Medicaid Other | Attending: Hematology & Oncology | Admitting: Hematology & Oncology

## 2016-01-05 ENCOUNTER — Ambulatory Visit (HOSPITAL_COMMUNITY): Payer: Self-pay

## 2016-01-05 VITALS — BP 142/95 | HR 82 | Temp 98.6°F | Resp 20 | Wt 153.0 lb

## 2016-01-05 DIAGNOSIS — I82C11 Acute embolism and thrombosis of right internal jugular vein: Secondary | ICD-10-CM | POA: Diagnosis not present

## 2016-01-05 DIAGNOSIS — C760 Malignant neoplasm of head, face and neck: Secondary | ICD-10-CM

## 2016-01-05 DIAGNOSIS — C539 Malignant neoplasm of cervix uteri, unspecified: Secondary | ICD-10-CM | POA: Insufficient documentation

## 2016-01-05 DIAGNOSIS — I10 Essential (primary) hypertension: Secondary | ICD-10-CM | POA: Insufficient documentation

## 2016-01-05 DIAGNOSIS — R221 Localized swelling, mass and lump, neck: Secondary | ICD-10-CM | POA: Insufficient documentation

## 2016-01-05 DIAGNOSIS — Z9889 Other specified postprocedural states: Secondary | ICD-10-CM | POA: Insufficient documentation

## 2016-01-05 DIAGNOSIS — F17210 Nicotine dependence, cigarettes, uncomplicated: Secondary | ICD-10-CM | POA: Insufficient documentation

## 2016-01-05 DIAGNOSIS — Z720 Tobacco use: Secondary | ICD-10-CM | POA: Diagnosis not present

## 2016-01-05 DIAGNOSIS — E785 Hyperlipidemia, unspecified: Secondary | ICD-10-CM | POA: Insufficient documentation

## 2016-01-05 DIAGNOSIS — Z79899 Other long term (current) drug therapy: Secondary | ICD-10-CM | POA: Insufficient documentation

## 2016-01-05 DIAGNOSIS — M1990 Unspecified osteoarthritis, unspecified site: Secondary | ICD-10-CM | POA: Insufficient documentation

## 2016-01-05 DIAGNOSIS — Z8489 Family history of other specified conditions: Secondary | ICD-10-CM | POA: Insufficient documentation

## 2016-01-05 DIAGNOSIS — M542 Cervicalgia: Secondary | ICD-10-CM | POA: Insufficient documentation

## 2016-01-05 DIAGNOSIS — J432 Centrilobular emphysema: Secondary | ICD-10-CM | POA: Insufficient documentation

## 2016-01-05 NOTE — Progress Notes (Signed)
[facility] Progress Note  Patient Care Team: Noel Journey. 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 Benjamine Mola 12/21/2015 No obvious primary lesion noted on panendoscopy examination CT neck 12/17/2015 large infiltrative tumor mass extends to skin surface, infiltrates R patorid, R submandibular, R carotid sheath,Thrombosis of R IJ vein, multiple satellite nodules Centrilobular emphysema P16 negative Tobacco Abuse  HISTORY OF PRESENTING ILLNESS:  Gina Frederick 60 y.o. female is here on referral from Dr. 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   She is here today with a man named Herbie Baltimore. 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 Ms. Kreisel lives with the family she cares for.  She notes that she was sent to Dr. Benjamine Mola on [facility]  I have personally reviewed and went over imaging studies with the patient.  She is concerned that she will lose her hair with chemotherapy treatment. Herbie Baltimore is concerned if Ms. Lamia would be able to continue to act as caretaker for his mother while receiving treatment. He is willing to help her with transportation to and from treatment.  She presents today for discussion of recent imaging results and recommendations for treatment options.  She denies pain. She continues to smoke although she notes she is cutting back. She is anxious to proceed with therapy.    MEDICAL HISTORY:  Past Medical History  Diagnosis Date  . Hypertension   . Arthritis     hands  . Neck mass     right    SURGICAL HISTORY: Past Surgical History  Procedure Laterality Date  . Tonsillectomy    . Knee arthroscopy      right  . Cesarean section    . Mass biopsy Right 12/21/2015    Procedure: NECK MASS BIOPSY;  Surgeon: Leta Baptist, MD;   Location: [place];  Service: ENT;  Laterality: Right;  . Panendoscopy N/A 12/21/2015    Procedure: PANENDOSCOPY;  Surgeon: Leta Baptist, MD;  Location: [place];  Service: ENT;  Laterality: N/A;    SOCIAL HISTORY: Social History   Social History  . Marital Status: Divorced    Spouse Name: N/A  . Number of Children: N/A  . Years of Education: N/A   Occupational History  . Not on file.   Social History Main Topics  . Smoking status: Current Every Day Smoker -- 0.25 packs/day    Types: Cigarettes  . Smokeless tobacco: Not on file  . Alcohol Use: No  . Drug Use: No  . Sexual Activity: No   Other Topics Concern  . Not on file   Social History Narrative   She is divorced. One daughter, no grandchildren Smokes, notes that she is down to 3 a day. Began smoking "probably in her 70's." She says she has quit "on and off through the years." Denies problems with EtOH. 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 Quit working when her daughter was "of age to go to school," then stayed home with her daughter. For hobbies, she says she likes to read.  Is currently the caregiver for a lady with diabetes.  She does light housekeeping and keeps her on her medications, and cooks. The son of the woman she cares for, Herbie Baltimore, notes  that her good cooking has brought his momma's blood pressure and blood sugar down.  She notes she has a good support system with her sister, and with the son of the woman she takes care of.  FAMILY HISTORY: Family History  Problem Relation Age of Onset  . Alzheimer's disease Mother   . Congestive Heart Failure Father    indicated that her mother is deceased. She indicated that her father is deceased.   Mother and father are deceased. Mother was 77; died of Alzheimer's. Father was 1; congestive heart failure. Has a sister, age 78 this year. Had a  brother, deceased. He took his own life. 4 nieces and nephews.  ALLERGIES:  is allergic to codeine.  MEDICATIONS:  Current Outpatient Prescriptions  Medication Sig Dispense Refill  . lisinopril-hydrochlorothiazide (PRINZIDE,ZESTORETIC) 20-12.5 MG tablet Take 1 tablet by mouth daily.    Marland Kitchen lovastatin (MEVACOR) 20 MG tablet Take 20 mg by mouth at bedtime.    Marland Kitchen oxyCODONE-acetaminophen (ROXICET) 5-325 MG tablet Take 1 tablet by mouth every 4 (four) hours as needed for severe pain. 20 tablet 0   No current facility-administered medications for this visit.    Review of Systems  Constitutional: Positive for weight loss.       Lost about 10 pounds since Christmas, by her reckoning.  HENT: Negative.   Eyes: Negative.   Respiratory: Positive for cough.        "Light cough" since starting her blood pressure medication.  Cardiovascular: Negative.   Gastrointestinal: Negative.   Genitourinary: Negative.   Musculoskeletal: Negative.   Skin: Negative.   Neurological: Negative.   Endo/Heme/Allergies: Negative.   Psychiatric/Behavioral: Negative.   All other systems reviewed and are negative.  14 point ROS was done and is otherwise as detailed above or in HPI   PHYSICAL EXAMINATION: ECOG PERFORMANCE STATUS: 1 - Symptomatic but completely ambulatory  Filed Vitals:   01/05/16 1016  BP: 142/95  Pulse: 82  Temp: 98.6 F (37 C)  Resp: 20   Filed Weights   01/05/16 1016  Weight: 153 lb (69.4 kg)    Physical Exam  Constitutional: She is oriented to person, place, and time and well-developed, well-nourished, and in no distress.  Wears glasses  HENT:  Head: Normocephalic and atraumatic.  Nose: Nose normal.  Mouth/Throat: Oropharynx is clear and moist. No oropharyngeal exudate.  Limited as to how far she can open her mouth, due to her mass.  Eyes: Conjunctivae and EOM are normal. Pupils are equal, round, and reactive to light. Right eye exhibits no discharge. Left eye exhibits no  discharge. No scleral icterus.  Neck: Normal range of motion. Neck supple. No tracheal deviation present. No thyromegaly present.  Large R sided neck mass that begins just lateral to the thyroid cartilage, approximately 8 cm in greatest dimension, small tumor extension through the skin, well healed surgical incision site noted. Tumor extends to the base of the mandible  Cardiovascular: Normal rate, regular rhythm and normal heart sounds.  Exam reveals no gallop and no friction rub.   No murmur heard. Pulmonary/Chest: Effort normal and breath sounds normal. She has no wheezes. She has no rales.  Abdominal: Soft. Bowel sounds are normal. She exhibits no distension and no mass. There is no tenderness. There is no rebound and no guarding.  Musculoskeletal: Normal range of motion. She exhibits no edema.  Lymphadenopathy:    She has no cervical adenopathy.  Neurological: She is alert and oriented to person, place, and time. She has  normal reflexes. No cranial nerve deficit. Gait normal. Coordination normal.  Skin: Skin is warm and dry. No rash noted.  Psychiatric: Mood, memory, affect and judgment normal.  Nursing note and vitals reviewed.   LABORATORY DATA:  I have reviewed the data as listed  No results found for: WBC, HGB, HCT, MCV, PLT CMP  No results found for: NA, K, CL, CO2, GLUCOSE, BUN, CREATININE, CALCIUM, PROT, ALBUMIN, AST, ALT, ALKPHOS, BILITOT, GFRNONAA, GFRAA   RADIOGRAPHIC STUDIES: I have personally reviewed the radiological images as listed and agreed with the findings in the report.  Ct Soft Tissue Neck W Contrast  12/17/2015  CLINICAL DATA:  Right-sided neck mass. Poorly differentiated cancer. EXAM: CT NECK WITH CONTRAST TECHNIQUE: Multidetector CT imaging of the neck was performed using the standard protocol following the bolus administration of intravenous contrast. CONTRAST:  49mL OMNIPAQUE IOHEXOL 300 MG/ML  SOLN COMPARISON:  Thyroid ultrasound 11/18/2015. FINDINGS:  Pharynx and larynx: Mild diffuse edema is present within the hypo pharyngeal mucosa. No discrete mass lesion is evident. The tongue base is within normal limits. The vocal cords are midline and symmetric. Salivary glands: The left submandibular and parotid gland are within normal limits. The right submandibular and parotid glands are inflated by a very large heterogeneous in peripherally enhancing infiltrative mass lesion of the right neck. Thyroid: A multinodular goiter is again seen. Lymph nodes: There is a peripherally enhancing infiltrative right neck mass measures 7.2 x 5.4 x 7.1 cm. The right internal jugular vein is thrombosed. Multiple additional satellite nodules are noted. A right level 4 nodule measures 1.7 cm. A supra clavicular nodule on the right measures 1.6 cm. A lateral right level 2 node measures 1.1 cm. A right sub mandibular nodule measures 1.8 cm. Bilateral submental rounded nodes are present. There is a hyperdense left submandibular node measuring 1.7 x 7 1.7 x 0.7 cm. A benign appearing jugulodigastric lymph node is present on the left. A rounded left level 4 node measures 11 mm. Vascular: The right internal jugular vein is thrombosed below the level of the mass. The right internal carotid artery is deviated medially by the mass. There is tumor infiltration of the carotid sheath. Atherosclerotic changes are present bilaterally without significant stenosis. Limited intracranial: Unremarkable Visualized orbits: Within normal limits. Mastoids and visualized paranasal sinuses: Mild mucosal thickening is present in the inferior maxillary sinuses bilaterally, right greater than left. The remaining paranasal sinuses and the mastoid air cells are clear. Skeleton: Typical degenerative changes are present in the cervical spine from C3-4 through C6-7. Uncovertebral spurring is present bilaterally, greatest at C5-6. No focal lytic or blastic lesions are present. Upper chest: Centrilobular emphysematous  changes are noted at the lung apices bilaterally. An ill-defined nodule at the right lung apex measures 8 mm the left lung is clear. The upper mediastinum is unremarkable. IMPRESSION: 1. Large infiltrative tumor mass extends to the skin surface with dermal thickening. The lesion infiltrates the right parotid gland, the right submandibular gland, the right carotid sheath, the right submandibular space, and extends to the thyroid cartilage on the right. 2. Thrombosis of the right internal jugular vein. 3. Multiple satellite nodules extend from the supraclavicular space to the right submandibular space. 4. Bilateral submental lymph nodes are concerning for tumor. 5. Rounded left level 4 lymph node is concerning for tumor. 6. Centrilobular emphysema. 7. Indeterminate 8 mm ground-glass nodule at the right lung apex. CT the chest with contrast could be utilized for evaluation of additional lung nodules if clinically indicated.  Electronically Signed   By: San Morelle M.D.   On: 12/17/2015 21:03   Ct Chest W Contrast  01/03/2016  CLINICAL DATA:  Squamous cell cancer the head and neck. EXAM: CT CHEST, ABDOMEN, AND PELVIS WITH CONTRAST TECHNIQUE: Multidetector CT imaging of the chest, abdomen and pelvis was performed following the standard protocol during bolus administration of intravenous contrast. CONTRAST:  118mL OMNIPAQUE IOHEXOL 300 MG/ML  SOLN COMPARISON:  None. FINDINGS: CT CHEST FINDINGS Mediastinum/Lymph Nodes: There is no axillary lymphadenopathy. There is no hilar lymphadenopathy. No mediastinal lymphadenopathy. Heart size is normal The esophagus has normal imaging features. Lungs/Pleura: Centrilobular emphysema noted in the lung apices. 9 mm ground-glass nodule posterior right lung apex likely related to scarring. Musculoskeletal: 6.4 x 8.1 cm right cervical mass noted in the lower neck. Bone windows reveal no worrisome lytic or sclerotic osseous lesions. CT ABDOMEN PELVIS FINDINGS Hepatobiliary: No  focal abnormality within the liver parenchyma. Numerous gallstones measure up to 5 mm diameter. Common bile duct is 9 mm in diameter at the level of the pancreatic head. Pancreas: No focal mass lesion. No dilatation of the main duct. No intraparenchymal cyst. No peripancreatic edema. Spleen: No splenomegaly. No focal mass lesion. Adrenals/Urinary Tract: No adrenal nodule or mass. Adrenal thickening is noted bilaterally. Kidneys are unremarkable. No evidence for hydroureter. The urinary bladder appears normal for the degree of distention. Stomach/Bowel: Stomach is nondistended. No gastric wall thickening. No evidence of outlet obstruction. Duodenum is normally positioned as is the ligament of Treitz. No small bowel wall thickening. No small bowel dilatation. The terminal ileum is normal. The appendix is normal. Diverticular changes are noted in the left colon without evidence of diverticulitis. Vascular/Lymphatic: There is abdominal aortic atherosclerosis without aneurysm. There is no gastrohepatic or hepatoduodenal ligament lymphadenopathy. No intraperitoneal or retroperitoneal lymphadenopathy. No pelvic sidewall lymphadenopathy. Reproductive: Uterus is surgically absent. There is no adnexal mass. Other: No intraperitoneal free fluid. Musculoskeletal: Bone windows reveal no worrisome lytic or sclerotic osseous lesions. IMPRESSION: 1. Large right-sided neck mass. 2. No evidence for lymphadenopathy or metastatic disease in the chest, abdomen, or pelvis. 3. 9 mm ground-glass nodule in the posterior right lung apex is probably related to scarring given the underlying emphysema. Attention on follow-up is recommended. 4. Cholelithiasis. 5. Abdominal aortic atherosclerosis. Electronically Signed   By: Misty Stanley M.D.   On: 01/03/2016 08:45   Nm Bone Scan Whole Body  01/04/2016  CLINICAL DATA:  Head neck cancer. EXAM: NUCLEAR MEDICINE WHOLE BODY BONE SCAN TECHNIQUE: Whole body anterior and posterior images were  obtained approximately 3 hours after intravenous injection of radiopharmaceutical. RADIOPHARMACEUTICALS:  CTs 01/03/2016 . MCi Technetium-47m MDP IV COMPARISON:  None. FINDINGS: Bilateral renal function and excretion. No acute bony abnormality identified. Increased activity noted over the right foot, clinical correlation suggested. These changes could be degenerative. No evidence of metastatic disease. IMPRESSION: 1. No evidence of metastatic disease. 2. Increased activity noted over the right foot, clinical correlation suggested. These changes may be degenerative . Electronically Signed   By: Marcello Moores  Register   On: 01/04/2016 12:16   Ct Abdomen Pelvis W Contrast  01/03/2016  CLINICAL DATA:  Squamous cell cancer the head and neck. EXAM: CT CHEST, ABDOMEN, AND PELVIS WITH CONTRAST TECHNIQUE: Multidetector CT imaging of the chest, abdomen and pelvis was performed following the standard protocol during bolus administration of intravenous contrast. CONTRAST:  165mL OMNIPAQUE IOHEXOL 300 MG/ML  SOLN COMPARISON:  None. FINDINGS: CT CHEST FINDINGS Mediastinum/Lymph Nodes: There is no axillary lymphadenopathy. There  is no hilar lymphadenopathy. No mediastinal lymphadenopathy. Heart size is normal The esophagus has normal imaging features. Lungs/Pleura: Centrilobular emphysema noted in the lung apices. 9 mm ground-glass nodule posterior right lung apex likely related to scarring. Musculoskeletal: 6.4 x 8.1 cm right cervical mass noted in the lower neck. Bone windows reveal no worrisome lytic or sclerotic osseous lesions. CT ABDOMEN PELVIS FINDINGS Hepatobiliary: No focal abnormality within the liver parenchyma. Numerous gallstones measure up to 5 mm diameter. Common bile duct is 9 mm in diameter at the level of the pancreatic head. Pancreas: No focal mass lesion. No dilatation of the main duct. No intraparenchymal cyst. No peripancreatic edema. Spleen: No splenomegaly. No focal mass lesion. Adrenals/Urinary Tract: No  adrenal nodule or mass. Adrenal thickening is noted bilaterally. Kidneys are unremarkable. No evidence for hydroureter. The urinary bladder appears normal for the degree of distention. Stomach/Bowel: Stomach is nondistended. No gastric wall thickening. No evidence of outlet obstruction. Duodenum is normally positioned as is the ligament of Treitz. No small bowel wall thickening. No small bowel dilatation. The terminal ileum is normal. The appendix is normal. Diverticular changes are noted in the left colon without evidence of diverticulitis. Vascular/Lymphatic: There is abdominal aortic atherosclerosis without aneurysm. There is no gastrohepatic or hepatoduodenal ligament lymphadenopathy. No intraperitoneal or retroperitoneal lymphadenopathy. No pelvic sidewall lymphadenopathy. Reproductive: Uterus is surgically absent. There is no adnexal mass. Other: No intraperitoneal free fluid. Musculoskeletal: Bone windows reveal no worrisome lytic or sclerotic osseous lesions. IMPRESSION: 1. Large right-sided neck mass. 2. No evidence for lymphadenopathy or metastatic disease in the chest, abdomen, or pelvis. 3. 9 mm ground-glass nodule in the posterior right lung apex is probably related to scarring given the underlying emphysema. Attention on follow-up is recommended. 4. Cholelithiasis. 5. Abdominal aortic atherosclerosis. Electronically Signed   By: Misty Stanley M.D.   On: 01/03/2016 08:45   Study Result     CLINICAL DATA: Head neck cancer.  EXAM: NUCLEAR MEDICINE WHOLE BODY BONE SCAN  TECHNIQUE: Whole body anterior and posterior images were obtained approximately 3 hours after intravenous injection of radiopharmaceutical.  RADIOPHARMACEUTICALS: CTs 01/03/2016 . MCi Technetium-51m MDP IV  COMPARISON: None.  FINDINGS: Bilateral renal function and excretion. No acute bony abnormality identified. Increased activity noted over the right foot, clinical correlation suggested. These changes could be  degenerative. No evidence of metastatic disease.  IMPRESSION: 1. No evidence of metastatic disease.  2. Increased activity noted over the right foot, clinical correlation suggested. These changes may be degenerative .   Electronically Signed  By: Trimble  On: 01/04/2016 12:16   Study Result     CLINICAL DATA: Squamous cell cancer the head and neck.  EXAM: CT CHEST, ABDOMEN, AND PELVIS WITH CONTRAST  TECHNIQUE: Multidetector CT imaging of the chest, abdomen and pelvis was performed following the standard protocol during bolus administration of intravenous contrast.  CONTRAST: 182mL OMNIPAQUE IOHEXOL 300 MG/ML SOLN  COMPARISON: None.  FINDINGS: CT CHEST FINDINGS  Mediastinum/Lymph Nodes: There is no axillary lymphadenopathy. There is no hilar lymphadenopathy. No mediastinal lymphadenopathy. Heart size is normal The esophagus has normal imaging features.  Lungs/Pleura: Centrilobular emphysema noted in the lung apices. 9 mm ground-glass nodule posterior right lung apex likely related to scarring.  Musculoskeletal: 6.4 x 8.1 cm right cervical mass noted in the lower neck. Bone windows reveal no worrisome lytic or sclerotic osseous lesions.  CT ABDOMEN PELVIS FINDINGS  Hepatobiliary: No focal abnormality within the liver parenchyma. Numerous gallstones measure up to 5 mm diameter. Common bile  duct is 9 mm in diameter at the level of the pancreatic head.  Pancreas: No focal mass lesion. No dilatation of the main duct. No intraparenchymal cyst. No peripancreatic edema.  Spleen: No splenomegaly. No focal mass lesion.  Adrenals/Urinary Tract: No adrenal nodule or mass. Adrenal thickening is noted bilaterally. Kidneys are unremarkable. No evidence for hydroureter. The urinary bladder appears normal for the degree of distention.  Stomach/Bowel: Stomach is nondistended. No gastric wall thickening. No evidence of outlet obstruction.  Duodenum is normally positioned as is the ligament of Treitz. No small bowel wall thickening. No small bowel dilatation. The terminal ileum is normal. The appendix is normal. Diverticular changes are noted in the left colon without evidence of diverticulitis.  Vascular/Lymphatic: There is abdominal aortic atherosclerosis without aneurysm. There is no gastrohepatic or hepatoduodenal ligament lymphadenopathy. No intraperitoneal or retroperitoneal lymphadenopathy. No pelvic sidewall lymphadenopathy.  Reproductive: Uterus is surgically absent. There is no adnexal mass.  Other: No intraperitoneal free fluid.  Musculoskeletal: Bone windows reveal no worrisome lytic or sclerotic osseous lesions.  IMPRESSION: 1. Large right-sided neck mass. 2. No evidence for lymphadenopathy or metastatic disease in the chest, abdomen, or pelvis. 3. 9 mm ground-glass nodule in the posterior right lung apex is probably related to scarring given the underlying emphysema. Attention on follow-up is recommended. 4. Cholelithiasis. 5. Abdominal aortic atherosclerosis.   Electronically Signed  By: Misty Stanley M.D.  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 Benjamine Mola 12/21/2015 No obvious primary lesion noted on panendoscopy examination CT neck 12/17/2015 large infiltrative tumor mass extends to skin surface, infiltrates R patorid, R submandibular, R carotid sheath,Thrombosis of R IJ vein, multiple satellite nodules Centrilobular emphysema P16 negative Tobacco Use  I have personally reviewed and went over imaging studies and treatment options with the patient at length, including chemotherapy, port placement, radiotherapy, temporary feeding tube, nutrition, and speech therapy.  Our social worker, Loren Racer, contacted the patient, however she  was unable to get to the phone at the time. We will have Abby Potash contact the patient again in regards to her needs. I have spoken with Dr. Isidore Moos of radiation oncology about future radiation treatment. The patient will consult with Dr. Isidore Moos in [place]. The patient will meet with [facility] dentist before receiving radiotherapy.  The patient will receive a PET scan and discuss these results with Dr. Isidore Moos and myself.  She will receive chemotherapy teaching with Hildred Alamin, our patient navigator.  She will return for follow up after her port and FT placement.  Orders Placed This Encounter  Procedures  . NM PET Image Initial (PI) Skull Base To Thigh    Standing Status: Future     Number of Occurrences:      Standing Expiration Date: 01/04/2017    Order Specific Question:  Reason for Exam (SYMPTOM  OR DIAGNOSIS REQUIRED)    Answer:  head and neck cancer unknown primary staging    Order Specific Question:  Is the patient pregnant?    Answer:  No    Order Specific Question:  Preferred imaging location?    Answer:  [facility]    Order Specific Question:  If indicated for the ordered procedure, I authorize the administration of a radiopharmaceutical per Radiology protocol    Answer:  Yes  . IR Gastrostomy Tube    Standing Status: Future     Number of  Occurrences:      Standing Expiration Date: 03/06/2017    Order Specific Question:  Reason for Exam (SYMPTOM  OR DIAGNOSIS REQUIRED)    Answer:  head and neck cancer, please consider doing same day as port, patient with transportation difficulties    Order Specific Question:  Is the patient pregnant?    Answer:  No    Order Specific Question:  Preferred Imaging Location?    Answer:  [facility]  . IR Fluoro Guide CV Line Left    Standing Status: Future     Number of Occurrences:      Standing Expiration Date: 03/06/2017    Order Specific Question:  Reason for Exam (SYMPTOM  OR DIAGNOSIS REQUIRED)    Answer:  chemotherapy for head  and neck cancer, please consider doing same day as FT, patient with transportation difficulties    Order Specific Question:  Preferred Imaging Location?    Answer:  [facility]    Order Specific Question:  Is the patient pregnant?    Answer:  No    All questions were answered. The patient knows to call the clinic with any problems, questions or concerns.  This document serves as a record of services personally performed by Ancil Linsey, MD. It was created on her behalf by Arlyce Harman, a trained medical scribe. The creation of this record is based on the scribe's personal observations and the provider's statements to them. This document has been checked and approved by the attending provider.  I have reviewed the above documentation for accuracy and completeness, and I agree with the above.  This note was electronically signed.    Molli Hazard, MD  01/05/2016 10:50 AM

## 2016-01-05 NOTE — Progress Notes (Signed)
Referral sent to [facility].  Records faxed on 3/1

## 2016-01-05 NOTE — Patient Instructions (Addendum)
[place] at [facility] Discharge Instructions  RECOMMENDATIONS MADE BY THE CONSULTANT AND ANY TEST RESULTS WILL BE SENT TO YOUR REFERRING PHYSICIAN.   Exam and discussion by Dr Whitney Muse today Your scans just shows that you have a mass on the right side, small area in the lung that we will continue to watch. PET scan  See Dr Enrique Sack, dentist hopefully same day as your PET scan Need a feeding tube placed, and port-a-cath placed hopefully we can do that on the same day. You will meet with Dr. Isidore Moos (radiation oncology) after your PET scan  Chemotherapy is once every 3 weeks Radiation is every day for about 6-7 weeks. Burtis Junes is our nutritionist, he will be in contact with you throughout your treatment   Return to see the doctor after you have your port-a-cath placed Please call the clinic if you have any questions or concerns    Thank you for choosing [place] at [facility] to provide your oncology and hematology care.  To afford each patient quality time with our provider, please arrive at least 15 minutes before your scheduled appointment time.   Beginning January 23rd 2017 lab work for the [name] will be done in the  Main lab at [facility] on 1st floor. If you have a lab appointment with the [facility] please come in thru the  Main Entrance and check in at the main information desk  You need to re-schedule your appointment should you arrive 10 or more minutes late.  We strive to give you quality time with our providers, and arriving late affects you and other patients whose appointments are after yours.  Also, if you no show three or more times for appointments you may be dismissed from the clinic at the providers discretion.     Again, thank you for choosing [facility].  Our hope is that these requests will decrease the amount of time that you wait before being seen by our physicians.        _____________________________________________________________  Should you have questions after your visit to [facility], please contact our office at [phone] between the hours of 8:30 a.m. and 4:30 p.m.  Voicemails left after 4:30 p.m. will not be returned until the following business day.  For prescription refill requests, have your pharmacy contact our office.

## 2016-01-06 ENCOUNTER — Ambulatory Visit (INDEPENDENT_AMBULATORY_CARE_PROVIDER_SITE_OTHER): Payer: Medicaid Other | Admitting: Otolaryngology

## 2016-01-06 DIAGNOSIS — C760 Malignant neoplasm of head, face and neck: Secondary | ICD-10-CM

## 2016-01-07 ENCOUNTER — Ambulatory Visit (HOSPITAL_COMMUNITY): Payer: Self-pay | Admitting: Dentistry

## 2016-01-07 ENCOUNTER — Encounter (INDEPENDENT_AMBULATORY_CARE_PROVIDER_SITE_OTHER): Payer: Self-pay

## 2016-01-07 ENCOUNTER — Encounter (HOSPITAL_COMMUNITY): Payer: Self-pay | Admitting: Dentistry

## 2016-01-07 VITALS — BP 111/79 | HR 89 | Temp 98.5°F

## 2016-01-07 DIAGNOSIS — K029 Dental caries, unspecified: Secondary | ICD-10-CM

## 2016-01-07 DIAGNOSIS — K0530 Chronic periodontitis, unspecified: Secondary | ICD-10-CM

## 2016-01-07 DIAGNOSIS — M264 Malocclusion, unspecified: Secondary | ICD-10-CM

## 2016-01-07 DIAGNOSIS — K060 Gingival recession: Secondary | ICD-10-CM

## 2016-01-07 DIAGNOSIS — M2607 Excessive tuberosity of jaw: Secondary | ICD-10-CM

## 2016-01-07 DIAGNOSIS — K036 Deposits [accretions] on teeth: Secondary | ICD-10-CM

## 2016-01-07 DIAGNOSIS — Z01818 Encounter for other preprocedural examination: Secondary | ICD-10-CM | POA: Insufficient documentation

## 2016-01-07 DIAGNOSIS — K045 Chronic apical periodontitis: Secondary | ICD-10-CM

## 2016-01-07 DIAGNOSIS — K083 Retained dental root: Secondary | ICD-10-CM

## 2016-01-07 DIAGNOSIS — K0889 Other specified disorders of teeth and supporting structures: Secondary | ICD-10-CM | POA: Insufficient documentation

## 2016-01-07 DIAGNOSIS — C760 Malignant neoplasm of head, face and neck: Secondary | ICD-10-CM

## 2016-01-07 DIAGNOSIS — IMO0002 Reserved for concepts with insufficient information to code with codable children: Secondary | ICD-10-CM

## 2016-01-07 NOTE — Progress Notes (Signed)
DENTAL CONSULTATION  Date of Consultation:  01/07/2016 Patient Name:   Gina Frederick Date of Birth:   [birth year] Medical Record Number: [id number]  VITALS: BP 111/79 mmHg  Pulse 89  Temp(Src) 98.5 F (36.9 C) (Oral)  CHIEF COMPLAINT: Patient was referred by Dr. Whitney Muse for a medically necessary dental consultation.   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  Patient with anticipated chemoradiation therapy. Patient is now seen as part of a medically necessary pre-chemoradiation therapy dental protocol examination.  Patient currently denies any acute toothaches, swellings, or abscesses. Patient has not seen a dentist since approximately 2004. Patient had a filling placed at that time by Dr. Garwin Brothers in [place], [place]. Patient denies having any partial dentures. Patient is interested in having all remaining teeth extracted at this time with subsequent fabrication of upper and lower complete dentures.  PROBLEM LIST: Patient Active Problem List   Diagnosis Date Noted  . Squamous cell carcinoma of head and neck ([place]) 12/28/2015    Priority: High  . Tobacco use 12/28/2015  . HTN (hypertension) 12/28/2015    PMH: Past Medical History  Diagnosis Date  . Hypertension   . Arthritis     hands  . Neck mass     right  . Hyperlipidemia     PSH: Past Surgical History  Procedure Laterality Date  . Tonsillectomy    . Knee arthroscopy      right  . Cesarean section    . Mass biopsy Right 12/21/2015    Procedure: NECK MASS BIOPSY;  Surgeon: Leta Baptist, MD;  Location: [facility];  Service: ENT;  Laterality: Right;  . Panendoscopy N/A 12/21/2015    Procedure: PANENDOSCOPY;  Surgeon: Leta Baptist, MD;  Location: [facility];  Service: ENT;  Laterality: N/A;    ALLERGIES: Allergies  Allergen Reactions  . Codeine Nausea Only    MEDICATIONS: Current Outpatient Prescriptions   Medication Sig Dispense Refill  . lisinopril-hydrochlorothiazide (PRINZIDE,ZESTORETIC) 20-12.5 MG tablet Take 1 tablet by mouth daily.    Marland Kitchen lovastatin (MEVACOR) 20 MG tablet Take 20 mg by mouth at bedtime.    Marland Kitchen oxyCODONE-acetaminophen (ROXICET) 5-325 MG tablet Take 1 tablet by mouth every 4 (four) hours as needed for severe pain. (Patient not taking: Reported on 01/07/2016) 20 tablet 0   No current facility-administered medications for this visit.    LABS: No results found for: WBC, HGB, HCT, MCV, PLT No results found for: NA, K, CL, CO2, GLUCOSE, BUN, CREATININE, CALCIUM, GFRNONAA, GFRAA No results found for: INR, PROTIME No results found for: PTT  SOCIAL HISTORY: Social History   Social History  . Marital Status: Divorced    Spouse Name: N/A  . Number of Children: 1  . Years of Education: N/A   Occupational History  . Not on file.   Social History Main Topics  . Smoking status: Current Every Day Smoker -- 0.25 packs/day    Types: Cigarettes  . Smokeless tobacco: Never Used  . Alcohol Use: No  . Drug Use: No  . Sexual Activity: No   Other Topics Concern  . Not on file   Social History Narrative    FAMILY HISTORY: Family History  Problem Relation Age of Onset  . Alzheimer's disease Mother   . Congestive Heart Failure Father     REVIEW OF SYSTEMS: Reviewed the ROS below from Dr. Donald Pore note on 01/05/16 with the patient. No new changes noted. Constitutional:  Positive for weight loss.  Lost about 10 pounds since Christmas, by her reckoning.  HENT: Negative.  Eyes: Negative.  Respiratory: Positive for cough."Light cough" since starting her blood pressure medication.  Cardiovascular: Negative.  Gastrointestinal: Negative.  Genitourinary: Negative.  Musculoskeletal: Negative.  Skin: Negative.  Neurological: Negative.  Endo/Heme/Allergies: Negative.  Psychiatric/Behavioral: Negative. Denies dental phobia. All other systems reviewed and are  negative.  DENTAL HISTORY: CHIEF COMPLAINT: Patient was referred by Dr. Whitney Muse for a medically necessary dental consultation.   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  Patient with anticipated chemoradiation therapy. Patient is now seen as part of a medically necessary pre-chemoradiation there be dental protocol examination.  Patient currently denies any acute toothaches, swellings, or abscesses. Patient has not seen a dentist since approximately 2004. Patient had a filling placed at that time by Dr. Garwin Brothers in [place], [place]. Patient denies having any partial dentures. Patient is interested in having all remaining teeth extracted at this time with subsequent fabrication of upper and lower complete dentures.  DENTAL EXAMINATION: GENERAL: The patient is a well-developed, well-nourished female in no acute distress. HEAD AND NECK: There is a significant right neck mass consistent with squamous cell carcinoma diagnosis of the head and neck area. Patient denies acute TMJ symptoms. INTRAORAL EXAM: Patient has normal saliva. There is no evidence of oral abscess formation. Patient has bilateral excessive maxillary tuberosities. There is atrophy of the edentulous alveolar ridges. DENTITION: Patient is missing tooth numbers 1 through 5, 13, 14, 16, 17, 18, 29, 31, and 32. There are retained root segments in the area tooth numbers 6, 7, 8, 9, 11, 15, 22, 23, 24, 25, 26, 27, 28. PERIODONTAL: Patient has chronic, advanced periodontal disease with plaque and calculus accumulations, generalized gingival recession, and generalized tooth mobility.  DENTAL CARIES/SUBOPTIMAL RESTORATIONS: There are multiple dental caries noted as per dental charting form.  ENDODONTIC: Patient currently denies acute pulpitis symptoms. Patient has periapical pathology associated with tooth numbers 6, 7, 8, 10, 15, 19, 27, 28, and 30. CROWN AND  BRIDGE: Patient has PFM crowns on tooth numbers 10, 12, 19, 20, 21, and 30. There are recurrent caries associated with all crown restorations. PROSTHODONTIC: Patient denies having any partial dentures.  OCCLUSION: Patient has a poor occlusal scheme secondary to multiple missing teeth, multiple retained root segments, supra-eruption and drifting of the unopposed teeth into the edentulous areas, and lack of replacement of missing teeth with dental prostheses.  RADIOGRAPHIC INTERPRETATION: An orthopantogram was taken and supplemented with 14 periapical radiographs. There are multiple missing teeth. There are multiple retained root segments. There is supra-eruption and drifting of the unopposed teeth into the edentulous areas. There is moderate to severe bone loss. There multiple areas of periapical pathology and radiolucency. Multiple dental caries are noted. Multiple diastemas are noted. There is radiographic appearance of excessive maxillary osseous tuberosities.  ASSESSMENTS: 1. Squamous cell carcinoma of the right neck.  2. Pre-chemoradiation therapy dental protocol 3. Chronic apical periodontitis 4. Rampant dental caries 5. Multiple retained root segments 6. Chronic periodontitis with bone loss 7. Gingival recession 8. Accretions 9. Tooth mobility 10. Multiple missing teeth 11. Supra-eruption and drifting of the unopposed teeth into the edentulous areas 12. Bilateral excessive maxillary tuberosities 13. Malocclusion  PLAN/RECOMMENDATIONS: 1. I discussed the risks, benefits, and complications of various treatment options with the patient in relationship to her medical and dental conditions, anticipated chemoradiation therapy, and chemoradiation therapy side effects to include  xerostomia, radiation caries, trismus, mucositis, taste changes, gum and jawbone changes, and risk for infection, bleeding, and osteoradionecrosis. We discussed various treatment options to include no treatment,  multiple extractions with alveoloplasty, pre-prosthetic surgery as indicated, periodontal therapy, dental restorations, root canal therapy, crown and bridge therapy, implant therapy, and replacement of missing teeth as indicated. The patient currently wishes to proceed with extraction of all remaining teeth with alveoloplasty and pre-prosthetic surgery as indicated in the operating room with general anesthesia. This has been scheduled for Thursday, 01/13/2016 at 10 AM at [facility] in the operating room. Patient has been previously scheduled for placement of a feeding tube and Port-A-Cath on Wednesday, however, this most likely will need to be canceled to prevent infection of the Port-A-Cath from the anticipated dental extraction bacteremia. The patient will then proceed with chemoradiation therapy after adequate healing which is approximately 2-3 weeks. Patient will then follow-up with the general dentist of her choice for fabrication of upper lower complete dentures approximately 3 months after the last radiation therapy has been provided.   2. Discussion of findings with medical team and coordination of future medical and dental care as needed.  I spent in excess of  120 minutes during the conduct of this consultation and >50% of this time involved direct face-to-face encounter for counseling and/or coordination of the patient's care.    Lenn Cal, DDS

## 2016-01-07 NOTE — Patient Instructions (Signed)

## 2016-01-10 ENCOUNTER — Encounter (HOSPITAL_COMMUNITY): Payer: Self-pay

## 2016-01-10 ENCOUNTER — Encounter (HOSPITAL_COMMUNITY)
Admission: RE | Admit: 2016-01-10 | Discharge: 2016-01-10 | Disposition: A | Discharge status: Home / Self Care | Payer: Medicaid Other | Source: Ambulatory Visit | Attending: Dentistry | Admitting: Dentistry

## 2016-01-10 ENCOUNTER — Encounter (INDEPENDENT_AMBULATORY_CARE_PROVIDER_SITE_OTHER): Payer: Self-pay

## 2016-01-10 DIAGNOSIS — C760 Malignant neoplasm of head, face and neck: Secondary | ICD-10-CM | POA: Diagnosis not present

## 2016-01-10 DIAGNOSIS — Z01812 Encounter for preprocedural laboratory examination: Secondary | ICD-10-CM | POA: Insufficient documentation

## 2016-01-10 DIAGNOSIS — K045 Chronic apical periodontitis: Secondary | ICD-10-CM | POA: Insufficient documentation

## 2016-01-10 DIAGNOSIS — K029 Dental caries, unspecified: Secondary | ICD-10-CM | POA: Diagnosis not present

## 2016-01-10 DIAGNOSIS — K0889 Other specified disorders of teeth and supporting structures: Secondary | ICD-10-CM | POA: Diagnosis not present

## 2016-01-10 LAB — CBC
HCT: 37.4 % (ref 36.0–46.0)
HEMOGLOBIN: 12.2 g/dL (ref 12.0–15.0)
MCH: 30.1 pg (ref 26.0–34.0)
MCHC: 32.6 g/dL (ref 30.0–36.0)
MCV: 92.3 fL (ref 78.0–100.0)
Platelets: 435 10*3/uL — ABNORMAL HIGH (ref 150–400)
RBC: 4.05 MIL/uL (ref 3.87–5.11)
RDW: 13.2 % (ref 11.5–15.5)
WBC: 8.3 10*3/uL (ref 4.0–10.5)

## 2016-01-10 NOTE — Pre-Procedure Instructions (Deleted)
Gina Frederick  01/10/2016   Your procedure is scheduled on: 01-13-16  Report to [facility] Main  Entrance take [facility]  elevators to 3rd floor to  [place] at  0800 AM.  Call this number if you have problems the morning of surgery [phone]   Remember: ONLY 1 PERSON MAY GO WITH YOU TO SHORT STAY TO GET  READY MORNING OF [place].  Do not eat food or drink liquids :After Midnight.     Take these medicines the morning of surgery with A SIP OF WATER:  DO NOT TAKE ANY DIABETIC MEDICATIONS DAY OF YOUR SURGERY                               You may not have any metal on your body including hair pins and              piercings  Do not wear jewelry, make-up, lotions, powders or perfumes, deodorant             Do not wear nail polish.  Do not shave  48 hours prior to surgery.              Men may shave face and neck.   Do not bring valuables to the hospital. [place].  Contacts, dentures or bridgework may not be worn into surgery.  Leave suitcase in the car. After surgery it may be brought to your room.     Patients discharged the day of surgery will not be allowed to drive home.  Name and phone number of your driver:  Special Instructions: N/A              Please read over the following fact sheets you were given: _____________________________________________________________________             [facility] - Preparing for Surgery Before surgery, you can play an important role.  Because skin is not sterile, your skin needs to be as free of germs as possible.  You can reduce the number of germs on your skin by washing with CHG (chlorahexidine gluconate) soap before surgery.  CHG is an antiseptic cleaner which kills germs and bonds with the skin to continue killing germs even after washing. Please DO NOT use if you have an allergy to CHG or antibacterial soaps.  If your skin becomes reddened/irritated  stop using the CHG and inform your nurse when you arrive at Short Stay. Do not shave (including legs and underarms) for at least 48 hours prior to the first CHG shower.  You may shave your face/neck. Please follow these instructions carefully:  1.  Shower with CHG Soap the night before surgery and the  morning of Surgery.  2.  If you choose to wash your hair, wash your hair first as usual with your  normal  shampoo.  3.  After you shampoo, rinse your hair and body thoroughly to remove the  shampoo.                           4.  Use CHG as you would any other liquid soap.  You can apply chg directly  to the skin and wash  Gently with a scrungie or clean washcloth.  5.  Apply the CHG Soap to your body ONLY FROM THE NECK DOWN.   Do not use on face/ open                           Wound or open sores. Avoid contact with eyes, ears mouth and genitals (private parts).                       Wash face,  Genitals (private parts) with your normal soap.             6.  Wash thoroughly, paying special attention to the area where your surgery  will be performed.  7.  Thoroughly rinse your body with warm water from the neck down.  8.  DO NOT shower/wash with your normal soap after using and rinsing off  the CHG Soap.                9.  Pat yourself dry with a clean towel.            10.  Wear clean pajamas.            11.  Place clean sheets on your bed the night of your first shower and do not  sleep with pets. Day of Surgery : Do not apply any lotions/deodorants the morning of surgery.  Please wear clean clothes to the hospital/surgery center.  FAILURE TO FOLLOW THESE INSTRUCTIONS MAY RESULT IN THE CANCELLATION OF YOUR SURGERY PATIENT SIGNATURE_________________________________  NURSE SIGNATURE__________________________________  ________________________________________________________________________

## 2016-01-10 NOTE — Patient Instructions (Signed)
Gina Frederick  01/10/2016   Your procedure is scheduled on: 01-13-16  Report to [facility] Main  Entrance take [facility]  elevators to 3rd floor to  [place] at   0800 AM.  Call this number if you have problems the morning of surgery [phone]   Remember: ONLY 1 PERSON MAY GO WITH YOU TO SHORT STAY TO GET  READY MORNING OF [place].  Do not eat food or drink liquids :After Midnight.     Take these medicines the morning of surgery with A SIP OF WATER: None. DO NOT TAKE ANY DIABETIC MEDICATIONS DAY OF YOUR SURGERY                               You may not have any metal on your body including hair pins and              piercings  Do not wear jewelry, make-up, lotions, powders or perfumes, deodorant             Do not wear nail polish.  Do not shave  48 hours prior to surgery.              Men may shave face and neck.   Do not bring valuables to the hospital. [place].  Contacts, dentures or bridgework may not be worn into surgery.  Leave suitcase in the car. After surgery it may be brought to your room.     Patients discharged the day of surgery will not be allowed to drive home.  Name and phone number of your driver:Jamie Trinna Balloon , friend- [phone]   Special Instructions: N/A              Please read over the following fact sheets you were given: _____________________________________________________________________             [facility] - Preparing for Surgery Before surgery, you can play an important role.  Because skin is not sterile, your skin needs to be as free of germs as possible.  You can reduce the number of germs on your skin by washing with CHG (chlorahexidine gluconate) soap before surgery.  CHG is an antiseptic cleaner which kills germs and bonds with the skin to continue killing germs even after washing. Please DO NOT use if you have an allergy to CHG or antibacterial soaps.   If your skin becomes reddened/irritated stop using the CHG and inform your nurse when you arrive at Short Stay. Do not shave (including legs and underarms) for at least 48 hours prior to the first CHG shower.  You may shave your face/neck. Please follow these instructions carefully:  1.  Shower with CHG Soap the night before surgery and the  morning of Surgery.  2.  If you choose to wash your hair, wash your hair first as usual with your  normal  shampoo.  3.  After you shampoo, rinse your hair and body thoroughly to remove the  shampoo.                           4.  Use CHG as you would any other liquid soap.  You can apply chg directly  to  the skin and wash                       Gently with a scrungie or clean washcloth.  5.  Apply the CHG Soap to your body ONLY FROM THE NECK DOWN.   Do not use on face/ open                           Wound or open sores. Avoid contact with eyes, ears mouth and genitals (private parts).                       Wash face,  Genitals (private parts) with your normal soap.             6.  Wash thoroughly, paying special attention to the area where your surgery  will be performed.  7.  Thoroughly rinse your body with warm water from the neck down.  8.  DO NOT shower/wash with your normal soap after using and rinsing off  the CHG Soap.                9.  Pat yourself dry with a clean towel.            10.  Wear clean pajamas.            11.  Place clean sheets on your bed the night of your first shower and do not  sleep with pets. Day of Surgery : Do not apply any lotions/deodorants the morning of surgery.  Please wear clean clothes to the hospital/surgery center.  FAILURE TO FOLLOW THESE INSTRUCTIONS MAY RESULT IN THE CANCELLATION OF YOUR SURGERY PATIENT SIGNATURE_________________________________  NURSE SIGNATURE__________________________________  ________________________________________________________________________

## 2016-01-10 NOTE — Pre-Procedure Instructions (Signed)
01-10-16 [id number] Spoke with Dr. Ermalene Postin- "will see pt preop AM of  -for anesthesia consult"

## 2016-01-11 ENCOUNTER — Ambulatory Visit (HOSPITAL_COMMUNITY): Payer: Self-pay

## 2016-01-11 ENCOUNTER — Encounter: Payer: Self-pay | Admitting: *Deleted

## 2016-01-11 ENCOUNTER — Other Ambulatory Visit (HOSPITAL_COMMUNITY): Payer: Self-pay | Admitting: *Deleted

## 2016-01-11 ENCOUNTER — Telehealth (HOSPITAL_COMMUNITY): Payer: Self-pay | Admitting: Hematology & Oncology

## 2016-01-11 NOTE — Progress Notes (Signed)
[facility]  Clinical Social Work was referred by Futures trader for assessment of psychosocial needs due to possible support/resource needs. Pt had returned CSW call. Clinical Social Worker contacted patient at home to offer support and assess for needs.  CSW introduced self, explained role of CSW and shared upcoming support programs. Pt shared things were going well currently and denied concerns. Pt stated she was busy with all her appointments, was doing well currently. She agreed to reach out if needs changed.     Clinical Social Work interventions: Supportive listening Resource education  Loren Racer, [place] Tuesdays   [phone]

## 2016-01-11 NOTE — Telephone Encounter (Signed)
Pc to pt to introduce myself and to see if she needs help applying for medicaid. Pt applied for medicaid About two weeks ago and seems to be aware of her status. She stated she is not having any transportation issues at This time. Advised her to contact me if she needed my assistance with anything.  [facility] [phone]

## 2016-01-12 ENCOUNTER — Ambulatory Visit (HOSPITAL_COMMUNITY): Payer: Self-pay

## 2016-01-12 ENCOUNTER — Other Ambulatory Visit (HOSPITAL_COMMUNITY): Payer: Self-pay

## 2016-01-13 ENCOUNTER — Ambulatory Visit (HOSPITAL_COMMUNITY): Payer: Medicaid Other | Admitting: Anesthesiology

## 2016-01-13 ENCOUNTER — Ambulatory Visit (HOSPITAL_COMMUNITY)
Admission: RE | Admit: 2016-01-13 | Discharge: 2016-01-13 | Disposition: A | Discharge status: Home / Self Care | Payer: Medicaid Other | Source: Ambulatory Visit | Attending: Dentistry | Admitting: Dentistry

## 2016-01-13 ENCOUNTER — Encounter (HOSPITAL_COMMUNITY): Payer: Self-pay | Admitting: *Deleted

## 2016-01-13 ENCOUNTER — Encounter (HOSPITAL_COMMUNITY)
Admission: RE | Disposition: A | Discharge status: Home / Self Care | Payer: Self-pay | Source: Ambulatory Visit | Attending: Dentistry

## 2016-01-13 DIAGNOSIS — Z79891 Long term (current) use of opiate analgesic: Secondary | ICD-10-CM | POA: Diagnosis not present

## 2016-01-13 DIAGNOSIS — K0889 Other specified disorders of teeth and supporting structures: Secondary | ICD-10-CM | POA: Insufficient documentation

## 2016-01-13 DIAGNOSIS — M264 Malocclusion, unspecified: Secondary | ICD-10-CM | POA: Diagnosis not present

## 2016-01-13 DIAGNOSIS — C760 Malignant neoplasm of head, face and neck: Secondary | ICD-10-CM | POA: Insufficient documentation

## 2016-01-13 DIAGNOSIS — K045 Chronic apical periodontitis: Secondary | ICD-10-CM | POA: Insufficient documentation

## 2016-01-13 DIAGNOSIS — R911 Solitary pulmonary nodule: Secondary | ICD-10-CM | POA: Insufficient documentation

## 2016-01-13 DIAGNOSIS — M19041 Primary osteoarthritis, right hand: Secondary | ICD-10-CM | POA: Diagnosis not present

## 2016-01-13 DIAGNOSIS — M2607 Excessive tuberosity of jaw: Secondary | ICD-10-CM | POA: Insufficient documentation

## 2016-01-13 DIAGNOSIS — K029 Dental caries, unspecified: Secondary | ICD-10-CM | POA: Diagnosis not present

## 2016-01-13 DIAGNOSIS — I10 Essential (primary) hypertension: Secondary | ICD-10-CM | POA: Diagnosis not present

## 2016-01-13 DIAGNOSIS — Z79899 Other long term (current) drug therapy: Secondary | ICD-10-CM | POA: Diagnosis not present

## 2016-01-13 DIAGNOSIS — K0530 Chronic periodontitis, unspecified: Secondary | ICD-10-CM

## 2016-01-13 DIAGNOSIS — K083 Retained dental root: Secondary | ICD-10-CM | POA: Insufficient documentation

## 2016-01-13 DIAGNOSIS — M19042 Primary osteoarthritis, left hand: Secondary | ICD-10-CM | POA: Insufficient documentation

## 2016-01-13 DIAGNOSIS — F17210 Nicotine dependence, cigarettes, uncomplicated: Secondary | ICD-10-CM | POA: Diagnosis not present

## 2016-01-13 DIAGNOSIS — I700 Atherosclerosis of aorta: Secondary | ICD-10-CM | POA: Diagnosis not present

## 2016-01-13 DIAGNOSIS — J432 Centrilobular emphysema: Secondary | ICD-10-CM | POA: Diagnosis not present

## 2016-01-13 HISTORY — PX: MULTIPLE EXTRACTIONS WITH ALVEOLOPLASTY: SHX5342

## 2016-01-13 SURGERY — MULTIPLE EXTRACTION WITH ALVEOLOPLASTY
Anesthesia: General | Site: Mouth

## 2016-01-13 MED ORDER — BUPIVACAINE-EPINEPHRINE (PF) 0.5% -1:200000 IJ SOLN
INTRAMUSCULAR | Status: AC
  Filled 2016-01-13: qty 3.6

## 2016-01-13 MED ORDER — LACTATED RINGERS IV SOLN: INTRAVENOUS | Status: DC

## 2016-01-13 MED ORDER — 0.9 % SODIUM CHLORIDE (POUR BTL) OPTIME
TOPICAL | Status: DC | PRN
  Administered 2016-01-13: 1000 mL

## 2016-01-13 MED ORDER — CEFAZOLIN SODIUM-DEXTROSE 2-3 GM-% IV SOLR
INTRAVENOUS | Status: AC
  Filled 2016-01-13: qty 50

## 2016-01-13 MED ORDER — LIDOCAINE HCL (CARDIAC) 20 MG/ML IV SOLN
INTRAVENOUS | Status: AC
  Filled 2016-01-13: qty 5

## 2016-01-13 MED ORDER — SUCCINYLCHOLINE CHLORIDE 20 MG/ML IJ SOLN
INTRAMUSCULAR | Status: DC | PRN
  Administered 2016-01-13: 100 mg via INTRAVENOUS

## 2016-01-13 MED ORDER — PHENYLEPHRINE HCL 10 MG/ML IJ SOLN
INTRAMUSCULAR | Status: DC | PRN
  Administered 2016-01-13 (×2): 80 ug via INTRAVENOUS
  Administered 2016-01-13: 40 ug via INTRAVENOUS
  Administered 2016-01-13: 80 ug via INTRAVENOUS

## 2016-01-13 MED ORDER — OXYCODONE-ACETAMINOPHEN 5-325 MG PO TABS
1.0000 | ORAL_TABLET | Freq: Four times a day (QID) | ORAL | Status: DC | PRN
  Administered 2016-01-13: 1 via ORAL
  Filled 2016-01-13: qty 1

## 2016-01-13 MED ORDER — ONDANSETRON HCL 4 MG/2ML IJ SOLN
INTRAMUSCULAR | Status: AC
  Filled 2016-01-13: qty 2

## 2016-01-13 MED ORDER — LIDOCAINE-EPINEPHRINE 2 %-1:100000 IJ SOLN
INTRAMUSCULAR | Status: DC | PRN
  Administered 2016-01-13: 10.2 mL

## 2016-01-13 MED ORDER — PROMETHAZINE HCL 25 MG/ML IJ SOLN: 6.2500 mg | INTRAMUSCULAR | Status: DC | PRN

## 2016-01-13 MED ORDER — LACTATED RINGERS IV SOLN
INTRAVENOUS | Status: DC
  Administered 2016-01-13 (×2): 1000 mL via INTRAVENOUS

## 2016-01-13 MED ORDER — DEXAMETHASONE SODIUM PHOSPHATE 10 MG/ML IJ SOLN
INTRAMUSCULAR | Status: AC
  Filled 2016-01-13: qty 1

## 2016-01-13 MED ORDER — OXYMETAZOLINE HCL 0.05 % NA SOLN
NASAL | Status: AC
  Filled 2016-01-13: qty 15

## 2016-01-13 MED ORDER — OXYMETAZOLINE HCL 0.05 % NA SOLN
NASAL | Status: DC | PRN
  Administered 2016-01-13: 1 via NASAL

## 2016-01-13 MED ORDER — SODIUM CHLORIDE 0.9 % IJ SOLN
INTRAMUSCULAR | Status: AC
  Filled 2016-01-13: qty 10

## 2016-01-13 MED ORDER — EPHEDRINE SULFATE 50 MG/ML IJ SOLN
INTRAMUSCULAR | Status: AC
  Filled 2016-01-13: qty 1

## 2016-01-13 MED ORDER — SUGAMMADEX SODIUM 200 MG/2ML IV SOLN
INTRAVENOUS | Status: AC
  Filled 2016-01-13: qty 2

## 2016-01-13 MED ORDER — LIDOCAINE HCL (CARDIAC) 20 MG/ML IV SOLN
INTRAVENOUS | Status: DC | PRN
  Administered 2016-01-13: 60 mg via INTRAVENOUS

## 2016-01-13 MED ORDER — PROPOFOL 10 MG/ML IV BOLUS
INTRAVENOUS | Status: DC | PRN
  Administered 2016-01-13: 140 mg via INTRAVENOUS
  Administered 2016-01-13: 60 mg via INTRAVENOUS

## 2016-01-13 MED ORDER — CEFAZOLIN SODIUM-DEXTROSE 2-3 GM-% IV SOLR
2.0000 g | Freq: Once | INTRAVENOUS | Status: AC
  Administered 2016-01-13: 2 g via INTRAVENOUS

## 2016-01-13 MED ORDER — LIDOCAINE-EPINEPHRINE 2 %-1:100000 IJ SOLN
INTRAMUSCULAR | Status: AC
  Filled 2016-01-13: qty 10.2

## 2016-01-13 MED ORDER — ISOPROPYL ALCOHOL 70 % SOLN
Status: DC | PRN
  Administered 2016-01-13: 1 via TOPICAL

## 2016-01-13 MED ORDER — MIDAZOLAM HCL 2 MG/2ML IJ SOLN
INTRAMUSCULAR | Status: AC
  Filled 2016-01-13: qty 2

## 2016-01-13 MED ORDER — BUPIVACAINE-EPINEPHRINE (PF) 0.5% -1:200000 IJ SOLN
INTRAMUSCULAR | Status: DC | PRN
  Administered 2016-01-13: 3.6 mL

## 2016-01-13 MED ORDER — STERILE WATER FOR IRRIGATION IR SOLN
Status: DC | PRN
  Administered 2016-01-13: 1000 mL

## 2016-01-13 MED ORDER — HYDROMORPHONE HCL 1 MG/ML IJ SOLN: 0.2500 mg | INTRAMUSCULAR | Status: DC | PRN

## 2016-01-13 MED ORDER — ROCURONIUM BROMIDE 100 MG/10ML IV SOLN
INTRAVENOUS | Status: AC
  Filled 2016-01-13: qty 1

## 2016-01-13 MED ORDER — PROPOFOL 10 MG/ML IV BOLUS
INTRAVENOUS | Status: AC
  Filled 2016-01-13: qty 20

## 2016-01-13 MED ORDER — ROCURONIUM BROMIDE 100 MG/10ML IV SOLN
INTRAVENOUS | Status: DC | PRN
  Administered 2016-01-13 (×2): 10 mg via INTRAVENOUS
  Administered 2016-01-13: 20 mg via INTRAVENOUS

## 2016-01-13 MED ORDER — OXYCODONE-ACETAMINOPHEN 5-325 MG PO TABS: ORAL_TABLET | ORAL | Status: DC

## 2016-01-13 MED ORDER — ONDANSETRON HCL 4 MG/2ML IJ SOLN
INTRAMUSCULAR | Status: DC | PRN
  Administered 2016-01-13: 4 mg via INTRAVENOUS

## 2016-01-13 MED ORDER — DEXAMETHASONE SODIUM PHOSPHATE 10 MG/ML IJ SOLN
INTRAMUSCULAR | Status: DC | PRN
  Administered 2016-01-13: 10 mg via INTRAVENOUS

## 2016-01-13 MED ORDER — FENTANYL CITRATE (PF) 250 MCG/5ML IJ SOLN
INTRAMUSCULAR | Status: AC
  Filled 2016-01-13: qty 5

## 2016-01-13 MED ORDER — ISOPROPYL ALCOHOL 70 % SOLN
Status: AC
  Filled 2016-01-13: qty 480

## 2016-01-13 MED ORDER — MIDAZOLAM HCL 5 MG/5ML IJ SOLN
INTRAMUSCULAR | Status: DC | PRN
  Administered 2016-01-13: 2 mg via INTRAVENOUS

## 2016-01-13 MED ORDER — SUGAMMADEX SODIUM 200 MG/2ML IV SOLN
INTRAVENOUS | Status: DC | PRN
  Administered 2016-01-13: 150 mg via INTRAVENOUS

## 2016-01-13 MED ORDER — FENTANYL CITRATE (PF) 100 MCG/2ML IJ SOLN
INTRAMUSCULAR | Status: DC | PRN
  Administered 2016-01-13: 25 ug via INTRAVENOUS
  Administered 2016-01-13: 50 ug via INTRAVENOUS
  Administered 2016-01-13: 25 ug via INTRAVENOUS

## 2016-01-13 SURGICAL SUPPLY — 25 items
ATTRACTOMAT 16X20 MAGNETIC DRP (DRAPES) ×3 IMPLANT
BLADE SURG 15 STRL LF DISP TIS (BLADE) ×2 IMPLANT
BLADE SURG 15 STRL SS (BLADE) ×4
CANNULA VESSEL W/WING WO/VALVE (CANNULA) ×3 IMPLANT
GAUZE SPONGE 4X4 12PLY STRL (GAUZE/BANDAGES/DRESSINGS) ×9 IMPLANT
GAUZE SPONGE 4X4 16PLY XRAY LF (GAUZE/BANDAGES/DRESSINGS) ×3 IMPLANT
GLOVE BIOGEL PI IND STRL 6 (GLOVE) ×1 IMPLANT
GLOVE BIOGEL PI INDICATOR 6 (GLOVE) ×2
GLOVE SURG ORTHO 8.0 STRL STRW (GLOVE) ×3 IMPLANT
GLOVE SURG SS PI 6.0 STRL IVOR (GLOVE) ×3 IMPLANT
GOWN STRL REUS W/TWL 2XL LVL3 (GOWN DISPOSABLE) ×3 IMPLANT
GOWN STRL REUS W/TWL LRG LVL3 (GOWN DISPOSABLE) ×3 IMPLANT
KIT BASIN OR (CUSTOM PROCEDURE TRAY) ×3 IMPLANT
NEEDLE DENTAL 27 LONG (NEEDLE) ×6 IMPLANT
PACK EENT SPLIT (PACKS) ×3 IMPLANT
PACKING VAGINAL (PACKING) ×3 IMPLANT
SUCTION FRAZIER HANDLE 12FR (TUBING) ×2
SUCTION TUBE FRAZIER 12FR DISP (TUBING) ×1 IMPLANT
SUT CHROMIC 3 0 PS 2 (SUTURE) ×9 IMPLANT
SUT CHROMIC 4 0 P 3 18 (SUTURE) IMPLANT
SYR 50ML LL SCALE MARK (SYRINGE) ×3 IMPLANT
TOWEL OR 17X26 10 PK STRL BLUE (TOWEL DISPOSABLE) ×3 IMPLANT
TUBING CONNECTING 10 (TUBING) ×2 IMPLANT
TUBING CONNECTING 10' (TUBING) ×1
YANKAUER SUCT BULB TIP NO VENT (SUCTIONS) ×3 IMPLANT

## 2016-01-13 NOTE — Discharge Instructions (Signed)
MOUTH CARE AFTER SURGERY ° °FACTS: °· Ice used in ice bag helps keep the swelling down, and can help lessen the pain. °· It is easier to treat pain BEFORE it happens. °· Spitting disturbs the clot and may cause bleeding to start again, or to get worse. °· Smoking delays healing and can cause complications. °· Sharing prescriptions can be dangerous.  Do not take medications not recently prescribed for you. °· Antibiotics may stop birth control pills from working.  Use other means of birth control while on antibiotics. °· Warm salt water rinses after the first 24 hours will help lessen the swelling:  Use 1/2 teaspoonful of table salt per oz.of water. ° °DO NOT: °· Do not spit.  Do not drink through a straw. °· Strongly advised not to smoke, dip snuff or chew tobacco at least for 3 days. °· Do not eat sharp or crunchy foods.  Avoid the area of surgery when chewing. °· Do not stop your antibiotics before your instructions say to do so. °· Do not eat hot foods until bleeding has stopped.  If you need to, let your food cool down to room temperature. ° °EXPECT: °· Some swelling, especially first 2-3 days. °· Soreness or discomfort in varying degrees.  Follow your dentist's instructions about how to handle pain before it starts. °· Pinkish saliva or light blood in saliva, or on your pillow in the morning.  This can last around 24 hours. °· Bruising inside or outside the mouth.  This may not show up until 2-3 days after surgery.  Don't worry, it will go away in time. °· Pieces of "bone" may work themselves loose.  It's OK.  If they bother you, let us know. ° °WHAT TO DO IMMEDIATELY AFTER SURGERY: °· Bite on the gauze with steady pressure for 1-2 hours.  Don't chew on the gauze. °· Do not lie down flat.  Raise your head support especially for the first 24 hours. °· Apply ice to your face on the side of the surgery.  You may apply it 20 minutes on and a few minutes off.  Ice for 8-12 hours.  You may use ice up to 24  hours. °· Before the numbness wears off, take a pain pill as instructed. °· Prescription pain medication is not always required. ° °SWELLING: °· Expect swelling for the first couple of days.  It should get better after that. °· If swelling increases 3 days or so after surgery; let us know as soon as possible. ° °FEVER: °· Take Tylenol every 4 hours if needed to lower your temperature, especially if it is at 100F or higher. °· Drink lots of fluids. °· If the fever does not go away, let us know. ° °BREATHING TROUBLE: °· Any unusual difficulty breathing means you have to have someone bring you to the emergency room ASAP ° °BLEEDING: °· Light oozing is expected for 24 hours or so. °· Prop head up with pillows °· Avoid spitting °· Do not confuse bright red fresh flowing blood with lots of saliva colored with a little bit of blood. °· If you notice some bleeding, place gauze or a tea bag where it is bleeding and apply CONSTANT pressure by biting down for 1 hour.  Avoid talking during this time.  Do not remove the gauze or tea bag during this hour to "check" the bleeding. °· If you notice bright RED bleeding FLOWING out of particular area, and filling the floor of your mouth, put   a wad of gauze on that area, bite down firmly and constantly.  Call us immediately.  If we're closed, have someone bring you to the emergency room. ° °ORAL HYGIENE: °· Brush your teeth as usual after meals and before bedtime. °· Use a soft toothbrush around the area of surgery. °· DO NOT AVOID BRUSHING.  Otherwise bacteria(germs) will grow and may delay healing or encourage infection. °· Since you cannot spit, just gently rinse and let the water flow out of your mouth. °· DO NOT SWISH HARD. ° °EATING: °· Cool liquids are a good point to start.  Increase to soft foods as tolerated. ° °PRESCRIPTIONS: °· Follow the directions for your prescriptions exactly as written. °· If Dr. Kulinski gave you a narcotic pain medication, do not drive, operate  machinery or drink alcohol when on that medication. ° °QUESTIONS: °Call our office during office hours [phone] or call the Emergency Room at [phone]. ° ° ° ° ° ° °General Anesthesia, Adult, Care After °Refer to this sheet in the next few weeks. These instructions provide you with information on caring for yourself after your procedure. Your health care provider may also give you more specific instructions. Your treatment has been planned according to current medical practices, but problems sometimes occur. Call your health care provider if you have any problems or questions after your procedure. °WHAT TO EXPECT AFTER THE PROCEDURE °After the procedure, it is typical to experience: °Sleepiness. °Nausea and vomiting. °HOME CARE INSTRUCTIONS °For the first 24 hours after general anesthesia: °Have a responsible person with you. °Do not drive a car. If you are alone, do not take public transportation. °Do not drink alcohol. °Do not take medicine that has not been prescribed by your health care provider. °Do not sign important papers or make important decisions. °You may resume a normal diet and activities as directed by your health care provider. °Change bandages (dressings) as directed. °If you have questions or problems that seem related to general anesthesia, call the hospital and ask for the anesthetist or anesthesiologist on call. °SEEK MEDICAL CARE IF: °You have nausea and vomiting that continue the day after anesthesia. °You develop a rash. °SEEK IMMEDIATE MEDICAL CARE IF:  °You have difficulty breathing. °You have chest pain. °You have any allergic problems. °  °This information is not intended to replace advice given to you by your health care provider. Make sure you discuss any questions you have with your health care provider. °  °Document Released: 01/29/2001 Document Revised: 11/13/2014 Document Reviewed: 02/21/2012 °[employer] Interactive Patient Education ©2016 [employer] °·  °

## 2016-01-13 NOTE — Anesthesia Preprocedure Evaluation (Addendum)
Anesthesia Evaluation  Patient identified by MRN, date of birth, ID band Patient awake  General Assessment Comment:dx. squamous cell carcinoma-right neck  Reviewed: Allergy & Precautions, NPO status , Patient's Chart, lab work & pertinent test results  Airway Mallampati: III  TM Distance: <3 FB Neck ROM: Full  Mouth opening: Limited Mouth Opening Comment: Large mass R neck, impairs ability to open mouth Dental no notable dental hx.    Pulmonary Current Smoker,  Centrilobular emphysematous changes are noted at the lung apices bilaterally. An ill-defined nodule at the right lung apex measures 8 mm the left lung is clear. The upper mediastinum is unremarkable.   Pulmonary exam normal breath sounds clear to auscultation       Cardiovascular hypertension, Pt. on medications Normal cardiovascular exam Rhythm:Regular Rate:Normal     Neuro/Psych negative neurological ROS  negative psych ROS   GI/Hepatic negative GI ROS, Neg liver ROS,   Endo/Other  negative endocrine ROS  Renal/GU negative Renal ROS  negative genitourinary   Musculoskeletal negative musculoskeletal ROS (+)   Abdominal   Peds negative pediatric ROS (+)  Hematology negative hematology ROS (+)   Anesthesia Other Findings   Reproductive/Obstetrics negative OB ROS                            Anesthesia Physical Anesthesia Plan  ASA: III  Anesthesia Plan: General   Post-op Pain Management:    Induction: Intravenous  Airway Management Planned: Nasal ETT and Video Laryngoscope Planned  Additional Equipment:   Intra-op Plan:   Post-operative Plan: Extubation in OR  Informed Consent: I have reviewed the patients History and Physical, chart, labs and discussed the procedure including the risks, benefits and alternatives for the proposed anesthesia with the patient or authorized representative who has indicated his/her  understanding and acceptance.   Dental advisory given  Plan Discussed with: CRNA and Surgeon  Anesthesia Plan Comments:         Anesthesia Quick Evaluation

## 2016-01-13 NOTE — Anesthesia Postprocedure Evaluation (Signed)
Anesthesia Post Note  Patient: Gina Frederick  Procedure(s) Performed: Procedure(s) (LRB): EXTRACTION OF TOOTH #'S 6-12,15, 19-28, 30 WITH ALVEOLOPLASTY AND BILATERAL MAXILLARY TUBEROSITY REDUCTIONS (N/A)  Patient location during evaluation: PACU Anesthesia Type: General Level of consciousness: awake and alert Pain management: pain level controlled Vital Signs Assessment: post-procedure vital signs reviewed and stable Respiratory status: spontaneous breathing, nonlabored ventilation, respiratory function stable and patient connected to nasal cannula oxygen Cardiovascular status: blood pressure returned to baseline and stable Postop Assessment: no signs of nausea or vomiting Anesthetic complications: no    Last Vitals:  Filed Vitals:   01/13/16 1330 01/13/16 1400  BP: 127/84 140/74  Pulse: 84 93  Temp: 37.2 C 36.5 C  Resp: 16 16    Last Pain:  Filed Vitals:   01/13/16 1401  PainSc: 0-No pain                 Caelynn Marshman S

## 2016-01-13 NOTE — H&P (Signed)
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 Patient was seen as part of a medically necessary pre-chemoradiation there be dental protocol examination. Patient now presents for extraction remaining teeth with alveoloplasty and pre-prosthetic surgery as needed in the operating room with general anesthesia. Patient denies having any acute medical or dental changes. Please see note of Dr. Mcneil Sober dated 01/05/2016 to use as the H&P for the dental operating room procedure.  Lenn Cal, [place] at [facility] Progress Note  Patient Care Team: Noel Journey. 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 Benjamine Mola 12/21/2015 No obvious primary lesion noted on panendoscopy examination CT neck 12/17/2015 large infiltrative tumor mass extends to skin surface, infiltrates R patorid, R submandibular, R carotid sheath,Thrombosis of R IJ vein, multiple satellite nodules Centrilobular emphysema P16 negative Tobacco Abuse  HISTORY OF PRESENTING ILLNESS:  [name] 60 y.o. female is here on referral from Dr. 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   She is here today with a man named Herbie Baltimore. 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 Ms. Lunday lives with the family she cares for.  She notes that she was sent to Dr. Benjamine Mola on [facility]  I have personally reviewed and went over imaging studies with the patient.  She is concerned that she will lose her hair with chemotherapy treatment. Herbie Baltimore is concerned if [name] would be able to continue to act as caretaker for his mother while receiving  treatment. He is willing to help her with transportation to and from treatment.  She presents today for discussion of recent imaging results and recommendations for treatment options.  She denies pain. She continues to smoke although she notes she is cutting back. She is anxious to proceed with therapy.    MEDICAL HISTORY:  Past Medical History  Diagnosis Date  . Hypertension   . Arthritis     hands  . Neck mass     right    SURGICAL HISTORY: Past Surgical History  Procedure Laterality Date  . Tonsillectomy    . Knee arthroscopy      right  . Cesarean section    . Mass biopsy Right 12/21/2015    Procedure: NECK MASS BIOPSY; Surgeon: Leta Baptist, MD; Location: [place]; Service: ENT; Laterality: Right;  . Panendoscopy N/A 12/21/2015    Procedure: PANENDOSCOPY; Surgeon: Leta Baptist, MD; Location: [place]; Service: ENT; Laterality: N/A;    SOCIAL HISTORY: Social History   Social History  . Marital Status: Divorced    Spouse Name: N/A  . Number of Children: N/A  . Years of Education: N/A   Occupational History  . Not on file.   Social History Main Topics  . Smoking status: Current Every Day Smoker -- 0.25 packs/day    Types: Cigarettes  . Smokeless tobacco: Not on file  . Alcohol Use: No  . Drug Use: No  . Sexual Activity: No   Other Topics Concern  . Not on file   Social History Narrative  She is divorced. One daughter, no grandchildren Smokes, notes that she is down to 3 a day. Began smoking "probably in her 55's." She says she has quit "on and off through the years." Denies problems with EtOH. 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 Quit working when her daughter was "of age to go to school," then stayed home with her daughter. For hobbies,  she says she likes to read.  Is currently the caregiver for a lady with diabetes.  She does light housekeeping and keeps her on her medications, and cooks. The son of the woman she cares for, Herbie Baltimore, notes that her good cooking has brought his momma's blood pressure and blood sugar down.  She notes she has a good support system with her sister, and with the son of the woman she takes care of.  FAMILY HISTORY: Family History  Problem Relation Age of Onset  . Alzheimer's disease Mother   . Congestive Heart Failure Father    indicated that her mother is deceased. She indicated that her father is deceased.   Mother and father are deceased. Mother was 53; died of Alzheimer's. Father was 75; congestive heart failure. Has a sister, age 67 this year. Had a brother, deceased. He took his own life. 4 nieces and nephews.  ALLERGIES: is allergic to codeine.  MEDICATIONS:  Current Outpatient Prescriptions  Medication Sig Dispense Refill  . lisinopril-hydrochlorothiazide (PRINZIDE,ZESTORETIC) 20-12.5 MG tablet Take 1 tablet by mouth daily.    Marland Kitchen lovastatin (MEVACOR) 20 MG tablet Take 20 mg by mouth at bedtime.    Marland Kitchen oxyCODONE-acetaminophen (ROXICET) 5-325 MG tablet Take 1 tablet by mouth every 4 (four) hours as needed for severe pain. 20 tablet 0   No current facility-administered medications for this visit.    Review of Systems  Constitutional: Positive for weight loss.   Lost about 10 pounds since Christmas, by her reckoning.  HENT: Negative.  Eyes: Negative.  Respiratory: Positive for cough.   "Light cough" since starting her blood pressure medication.  Cardiovascular: Negative.  Gastrointestinal: Negative.  Genitourinary: Negative.  Musculoskeletal: Negative.  Skin: Negative.  Neurological: Negative.  Endo/Heme/Allergies: Negative.  Psychiatric/Behavioral: Negative.  All other systems reviewed and are negative.  14  point ROS was done and is otherwise as detailed above or in HPI   PHYSICAL EXAMINATION: ECOG PERFORMANCE STATUS: 1 - Symptomatic but completely ambulatory  Filed Vitals:   01/05/16 1016  BP: 142/95  Pulse: 82  Temp: 98.6 F (37 C)  Resp: 20   Filed Weights   01/05/16 1016  Weight: 153 lb (69.4 kg)    Physical Exam  Constitutional: She is oriented to person, place, and time and well-developed, well-nourished, and in no distress.  Wears glasses  HENT:  Head: Normocephalic and atraumatic.  Nose: Nose normal.  Mouth/Throat: Oropharynx is clear and moist. No oropharyngeal exudate.  Limited as to how far she can open her mouth, due to her mass.  Eyes: Conjunctivae and EOM are normal. Pupils are equal, round, and reactive to light. Right eye exhibits no discharge. Left eye exhibits no discharge. No scleral icterus.  Neck: Normal range of motion. Neck supple. No tracheal deviation present. No thyromegaly present.  Large R sided neck mass that begins just lateral to the thyroid cartilage, approximately 8 cm in greatest dimension, small tumor extension through the skin, well healed surgical incision site noted. Tumor extends to the base of the mandible  Cardiovascular:  Normal rate, regular rhythm and normal heart sounds. Exam reveals no gallop and no friction rub.  No murmur heard. Pulmonary/Chest: Effort normal and breath sounds normal. She has no wheezes. She has no rales.  Abdominal: Soft. Bowel sounds are normal. She exhibits no distension and no mass. There is no tenderness. There is no rebound and no guarding.  Musculoskeletal: Normal range of motion. She exhibits no edema.  Lymphadenopathy:   She has no cervical adenopathy.  Neurological: She is alert and oriented to person, place, and time. She has normal reflexes. No cranial nerve deficit. Gait normal. Coordination normal.  Skin: Skin is warm and dry. No rash noted.  Psychiatric: Mood, memory, affect and  judgment normal.  Nursing note and vitals reviewed.   LABORATORY DATA:  I have reviewed the data as listed   Recent Labs    No results found for: WBC, HGB, HCT, MCV, PLT   CMP  Labs (Brief)    No results found for: NA, K, CL, CO2, GLUCOSE, BUN, CREATININE, CALCIUM, PROT, ALBUMIN, AST, ALT, ALKPHOS, BILITOT, GFRNONAA, GFRAA     RADIOGRAPHIC STUDIES: I have personally reviewed the radiological images as listed and agreed with the findings in the report.   Imaging Results    Ct Soft Tissue Neck W Contrast  12/17/2015 CLINICAL DATA: Right-sided neck mass. Poorly differentiated cancer. EXAM: CT NECK WITH CONTRAST TECHNIQUE: Multidetector CT imaging of the neck was performed using the standard protocol following the bolus administration of intravenous contrast. CONTRAST: 45mL OMNIPAQUE IOHEXOL 300 MG/ML SOLN COMPARISON: Thyroid ultrasound 11/18/2015. FINDINGS: Pharynx and larynx: Mild diffuse edema is present within the hypo pharyngeal mucosa. No discrete mass lesion is evident. The tongue base is within normal limits. The vocal cords are midline and symmetric. Salivary glands: The left submandibular and parotid gland are within normal limits. The right submandibular and parotid glands are inflated by a very large heterogeneous in peripherally enhancing infiltrative mass lesion of the right neck. Thyroid: A multinodular goiter is again seen. Lymph nodes: There is a peripherally enhancing infiltrative right neck mass measures 7.2 x 5.4 x 7.1 cm. The right internal jugular vein is thrombosed. Multiple additional satellite nodules are noted. A right level 4 nodule measures 1.7 cm. A supra clavicular nodule on the right measures 1.6 cm. A lateral right level 2 node measures 1.1 cm. A right sub mandibular nodule measures 1.8 cm. Bilateral submental rounded nodes are present. There is a hyperdense left submandibular node measuring 1.7 x 7 1.7 x 0.7 cm. A benign appearing jugulodigastric lymph node  is present on the left. A rounded left level 4 node measures 11 mm. Vascular: The right internal jugular vein is thrombosed below the level of the mass. The right internal carotid artery is deviated medially by the mass. There is tumor infiltration of the carotid sheath. Atherosclerotic changes are present bilaterally without significant stenosis. Limited intracranial: Unremarkable Visualized orbits: Within normal limits. Mastoids and visualized paranasal sinuses: Mild mucosal thickening is present in the inferior maxillary sinuses bilaterally, right greater than left. The remaining paranasal sinuses and the mastoid air cells are clear. Skeleton: Typical degenerative changes are present in the cervical spine from C3-4 through C6-7. Uncovertebral spurring is present bilaterally, greatest at C5-6. No focal lytic or blastic lesions are present. Upper chest: Centrilobular emphysematous changes are noted at the lung apices bilaterally. An ill-defined nodule at the right lung apex measures 8 mm the left lung is clear. The upper mediastinum is unremarkable. IMPRESSION: 1. Large infiltrative tumor mass  extends to the skin surface with dermal thickening. The lesion infiltrates the right parotid gland, the right submandibular gland, the right carotid sheath, the right submandibular space, and extends to the thyroid cartilage on the right. 2. Thrombosis of the right internal jugular vein. 3. Multiple satellite nodules extend from the supraclavicular space to the right submandibular space. 4. Bilateral submental lymph nodes are concerning for tumor. 5. Rounded left level 4 lymph node is concerning for tumor. 6. Centrilobular emphysema. 7. Indeterminate 8 mm ground-glass nodule at the right lung apex. CT the chest with contrast could be utilized for evaluation of additional lung nodules if clinically indicated. Electronically Signed By: San Morelle M.D. On: 12/17/2015 21:03   Ct Chest W Contrast  01/03/2016  CLINICAL DATA: Squamous cell cancer the head and neck. EXAM: CT CHEST, ABDOMEN, AND PELVIS WITH CONTRAST TECHNIQUE: Multidetector CT imaging of the chest, abdomen and pelvis was performed following the standard protocol during bolus administration of intravenous contrast. CONTRAST: 172mL OMNIPAQUE IOHEXOL 300 MG/ML SOLN COMPARISON: None. FINDINGS: CT CHEST FINDINGS Mediastinum/Lymph Nodes: There is no axillary lymphadenopathy. There is no hilar lymphadenopathy. No mediastinal lymphadenopathy. Heart size is normal The esophagus has normal imaging features. Lungs/Pleura: Centrilobular emphysema noted in the lung apices. 9 mm ground-glass nodule posterior right lung apex likely related to scarring. Musculoskeletal: 6.4 x 8.1 cm right cervical mass noted in the lower neck. Bone windows reveal no worrisome lytic or sclerotic osseous lesions. CT ABDOMEN PELVIS FINDINGS Hepatobiliary: No focal abnormality within the liver parenchyma. Numerous gallstones measure up to 5 mm diameter. Common bile duct is 9 mm in diameter at the level of the pancreatic head. Pancreas: No focal mass lesion. No dilatation of the main duct. No intraparenchymal cyst. No peripancreatic edema. Spleen: No splenomegaly. No focal mass lesion. Adrenals/Urinary Tract: No adrenal nodule or mass. Adrenal thickening is noted bilaterally. Kidneys are unremarkable. No evidence for hydroureter. The urinary bladder appears normal for the degree of distention. Stomach/Bowel: Stomach is nondistended. No gastric wall thickening. No evidence of outlet obstruction. Duodenum is normally positioned as is the ligament of Treitz. No small bowel wall thickening. No small bowel dilatation. The terminal ileum is normal. The appendix is normal. Diverticular changes are noted in the left colon without evidence of diverticulitis. Vascular/Lymphatic: There is abdominal aortic atherosclerosis without aneurysm. There is no gastrohepatic or hepatoduodenal ligament  lymphadenopathy. No intraperitoneal or retroperitoneal lymphadenopathy. No pelvic sidewall lymphadenopathy. Reproductive: Uterus is surgically absent. There is no adnexal mass. Other: No intraperitoneal free fluid. Musculoskeletal: Bone windows reveal no worrisome lytic or sclerotic osseous lesions. IMPRESSION: 1. Large right-sided neck mass. 2. No evidence for lymphadenopathy or metastatic disease in the chest, abdomen, or pelvis. 3. 9 mm ground-glass nodule in the posterior right lung apex is probably related to scarring given the underlying emphysema. Attention on follow-up is recommended. 4. Cholelithiasis. 5. Abdominal aortic atherosclerosis. Electronically Signed By: Misty Stanley M.D. On: 01/03/2016 08:45   Nm Bone Scan Whole Body  01/04/2016 CLINICAL DATA: Head neck cancer. EXAM: NUCLEAR MEDICINE WHOLE BODY BONE SCAN TECHNIQUE: Whole body anterior and posterior images were obtained approximately 3 hours after intravenous injection of radiopharmaceutical. RADIOPHARMACEUTICALS: CTs 01/03/2016 . MCi Technetium-70m MDP IV COMPARISON: None. FINDINGS: Bilateral renal function and excretion. No acute bony abnormality identified. Increased activity noted over the right foot, clinical correlation suggested. These changes could be degenerative. No evidence of metastatic disease. IMPRESSION: 1. No evidence of metastatic disease. 2. Increased activity noted over the right foot, clinical correlation suggested. These changes may  be degenerative . Electronically Signed By: Marcello Moores Register On: 01/04/2016 12:16   Ct Abdomen Pelvis W Contrast  01/03/2016 CLINICAL DATA: Squamous cell cancer the head and neck. EXAM: CT CHEST, ABDOMEN, AND PELVIS WITH CONTRAST TECHNIQUE: Multidetector CT imaging of the chest, abdomen and pelvis was performed following the standard protocol during bolus administration of intravenous contrast. CONTRAST: 124mL OMNIPAQUE IOHEXOL 300 MG/ML SOLN COMPARISON: None. FINDINGS: CT  CHEST FINDINGS Mediastinum/Lymph Nodes: There is no axillary lymphadenopathy. There is no hilar lymphadenopathy. No mediastinal lymphadenopathy. Heart size is normal The esophagus has normal imaging features. Lungs/Pleura: Centrilobular emphysema noted in the lung apices. 9 mm ground-glass nodule posterior right lung apex likely related to scarring. Musculoskeletal: 6.4 x 8.1 cm right cervical mass noted in the lower neck. Bone windows reveal no worrisome lytic or sclerotic osseous lesions. CT ABDOMEN PELVIS FINDINGS Hepatobiliary: No focal abnormality within the liver parenchyma. Numerous gallstones measure up to 5 mm diameter. Common bile duct is 9 mm in diameter at the level of the pancreatic head. Pancreas: No focal mass lesion. No dilatation of the main duct. No intraparenchymal cyst. No peripancreatic edema. Spleen: No splenomegaly. No focal mass lesion. Adrenals/Urinary Tract: No adrenal nodule or mass. Adrenal thickening is noted bilaterally. Kidneys are unremarkable. No evidence for hydroureter. The urinary bladder appears normal for the degree of distention. Stomach/Bowel: Stomach is nondistended. No gastric wall thickening. No evidence of outlet obstruction. Duodenum is normally positioned as is the ligament of Treitz. No small bowel wall thickening. No small bowel dilatation. The terminal ileum is normal. The appendix is normal. Diverticular changes are noted in the left colon without evidence of diverticulitis. Vascular/Lymphatic: There is abdominal aortic atherosclerosis without aneurysm. There is no gastrohepatic or hepatoduodenal ligament lymphadenopathy. No intraperitoneal or retroperitoneal lymphadenopathy. No pelvic sidewall lymphadenopathy. Reproductive: Uterus is surgically absent. There is no adnexal mass. Other: No intraperitoneal free fluid. Musculoskeletal: Bone windows reveal no worrisome lytic or sclerotic osseous lesions. IMPRESSION: 1. Large right-sided neck mass. 2. No evidence for  lymphadenopathy or metastatic disease in the chest, abdomen, or pelvis. 3. 9 mm ground-glass nodule in the posterior right lung apex is probably related to scarring given the underlying emphysema. Attention on follow-up is recommended. 4. Cholelithiasis. 5. Abdominal aortic atherosclerosis. Electronically Signed By: Misty Stanley M.D. On: 01/03/2016 08:45    Study Result     CLINICAL DATA: Head neck cancer.  EXAM: NUCLEAR MEDICINE WHOLE BODY BONE SCAN  TECHNIQUE: Whole body anterior and posterior images were obtained approximately 3 hours after intravenous injection of radiopharmaceutical.  RADIOPHARMACEUTICALS: CTs 01/03/2016 . MCi Technetium-79m MDP IV  COMPARISON: None.  FINDINGS: Bilateral renal function and excretion. No acute bony abnormality identified. Increased activity noted over the right foot, clinical correlation suggested. These changes could be degenerative. No evidence of metastatic disease.  IMPRESSION: 1. No evidence of metastatic disease.  2. Increased activity noted over the right foot, clinical correlation suggested. These changes may be degenerative .   Electronically Signed  By: Roman Forest  On: 01/04/2016 12:16   Study Result     CLINICAL DATA: Squamous cell cancer the head and neck.  EXAM: CT CHEST, ABDOMEN, AND PELVIS WITH CONTRAST  TECHNIQUE: Multidetector CT imaging of the chest, abdomen and pelvis was performed following the standard protocol during bolus administration of intravenous contrast.  CONTRAST: 127mL OMNIPAQUE IOHEXOL 300 MG/ML SOLN  COMPARISON: None.  FINDINGS: CT CHEST FINDINGS  Mediastinum/Lymph Nodes: There is no axillary lymphadenopathy. There is no hilar lymphadenopathy. No mediastinal lymphadenopathy. Heart size is  normal The esophagus has normal imaging features.  Lungs/Pleura: Centrilobular emphysema noted in the lung apices. 9 mm ground-glass nodule posterior right  lung apex likely related to scarring.  Musculoskeletal: 6.4 x 8.1 cm right cervical mass noted in the lower neck. Bone windows reveal no worrisome lytic or sclerotic osseous lesions.  CT ABDOMEN PELVIS FINDINGS  Hepatobiliary: No focal abnormality within the liver parenchyma. Numerous gallstones measure up to 5 mm diameter. Common bile duct is 9 mm in diameter at the level of the pancreatic head.  Pancreas: No focal mass lesion. No dilatation of the main duct. No intraparenchymal cyst. No peripancreatic edema.  Spleen: No splenomegaly. No focal mass lesion.  Adrenals/Urinary Tract: No adrenal nodule or mass. Adrenal thickening is noted bilaterally. Kidneys are unremarkable. No evidence for hydroureter. The urinary bladder appears normal for the degree of distention.  Stomach/Bowel: Stomach is nondistended. No gastric wall thickening. No evidence of outlet obstruction. Duodenum is normally positioned as is the ligament of Treitz. No small bowel wall thickening. No small bowel dilatation. The terminal ileum is normal. The appendix is normal. Diverticular changes are noted in the left colon without evidence of diverticulitis.  Vascular/Lymphatic: There is abdominal aortic atherosclerosis without aneurysm. There is no gastrohepatic or hepatoduodenal ligament lymphadenopathy. No intraperitoneal or retroperitoneal lymphadenopathy. No pelvic sidewall lymphadenopathy.  Reproductive: Uterus is surgically absent. There is no adnexal mass.  Other: No intraperitoneal free fluid.  Musculoskeletal: Bone windows reveal no worrisome lytic or sclerotic osseous lesions.  IMPRESSION: 1. Large right-sided neck mass. 2. No evidence for lymphadenopathy or metastatic disease in the chest, abdomen, or pelvis. 3. 9 mm ground-glass nodule in the posterior right lung apex is probably related to scarring given the underlying emphysema. Attention on follow-up is recommended. 4.  Cholelithiasis. 5. Abdominal aortic atherosclerosis.   Electronically Signed  By: Misty Stanley M.D.  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 Benjamine Mola 12/21/2015 No obvious primary lesion noted on panendoscopy examination CT neck 12/17/2015 large infiltrative tumor mass extends to skin surface, infiltrates R patorid, R submandibular, R carotid sheath,Thrombosis of R IJ vein, multiple satellite nodules Centrilobular emphysema P16 negative Tobacco Use  I have personally reviewed and went over imaging studies and treatment options with the patient at length, including chemotherapy, port placement, radiotherapy, temporary feeding tube, nutrition, and speech therapy.  Our social worker, Loren Racer, contacted the patient, however she was unable to get to the phone at the time. We will have Abby Potash contact the patient again in regards to her needs. I have spoken with Dr. Isidore Moos of radiation oncology about future radiation treatment. The patient will consult with Dr. Isidore Moos in [place]. The patient will meet with [facility] dentist before receiving radiotherapy.  The patient will receive a PET scan and discuss these results with Dr. Isidore Moos and myself.  She will receive chemotherapy teaching with Hildred Alamin, our patient navigator.  She will return for follow up after her port and FT placement.  Orders Placed This Encounter  Procedures  . NM PET Image Initial (PI) Skull Base To Thigh    Standing Status: Future     Number of Occurrences:      Standing Expiration Date: 01/04/2017    Order Specific Question:  Reason for Exam (SYMPTOM OR DIAGNOSIS REQUIRED)    Answer:  head and neck cancer unknown primary staging    Order Specific Question:  Is the patient pregnant?    Answer:  No    Order Specific  Question:  Preferred imaging location?    Answer:  [facility]    Order Specific Question:  If indicated for the ordered procedure, I authorize the administration of a radiopharmaceutical per Radiology protocol    Answer:  Yes  . IR Gastrostomy Tube    Standing Status: Future     Number of Occurrences:      Standing Expiration Date: 03/06/2017    Order Specific Question:  Reason for Exam (SYMPTOM OR DIAGNOSIS REQUIRED)    Answer:  head and neck cancer, please consider doing same day as port, patient with transportation difficulties    Order Specific Question:  Is the patient pregnant?    Answer:  No    Order Specific Question:  Preferred Imaging Location?    Answer:  [name]  . IR Fluoro Guide CV Line Left    Standing Status: Future     Number of Occurrences:      Standing Expiration Date: 03/06/2017    Order Specific Question:  Reason for Exam (SYMPTOM OR DIAGNOSIS REQUIRED)    Answer:  chemotherapy for head and neck cancer, please consider doing same day as FT, patient with transportation difficulties    Order Specific Question:  Preferred Imaging Location?    Answer:  [facility]    Order Specific Question:  Is the patient pregnant?    Answer:  No    All questions were answered. The patient knows to call the clinic with any problems, questions or concerns.  This document serves as a record of services personally performed by Ancil Linsey, MD. It was created on her behalf by Arlyce Harman, a trained medical scribe. The creation of this record is based on the scribe's personal observations and the provider's statements to them. This document has been checked and approved by the attending provider.  I have reviewed the above documentation for accuracy and completeness, and I agree with the above.  This note was electronically signed.   Molli Hazard, MD  01/05/2016 10:50 AM

## 2016-01-13 NOTE — Transfer of Care (Signed)
Immediate Anesthesia Transfer of Care Note  Patient: Nyaja Barreau  Procedure(s) Performed: Procedure(s): EXTRACTION OF TOOTH #'S 6-12,15, 19-28, 30 WITH ALVEOLOPLASTY AND BILATERAL MAXILLARY TUBEROSITY REDUCTIONS (N/A)  Patient Location: PACU  Anesthesia Type:General  Level of Consciousness: awake, alert  and oriented  Airway & Oxygen Therapy: Patient Spontanous Breathing and Patient connected to face mask oxygen  Post-op Assessment: Report given to RN and Post -op Vital signs reviewed and stable  Post vital signs: Reviewed and stable  Last Vitals:  Filed Vitals:   01/13/16 0801  BP: 141/80  Pulse: 103  Temp: 36.9 C  Resp: 18    Complications: No apparent anesthesia complications

## 2016-01-13 NOTE — Op Note (Signed)
OPERATIVE REPORT  Patient:            Gina Frederick Date of Birth:  13-Aug-1956 MRN:                [id number]   DATE OF PROCEDURE:  01/13/2016  PREOPERATIVE DIAGNOSES: 1. 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 Pre-chemoradiation therapy dental protocol 3. Chronic apical periodontitis 4. Dental caries 5. Retained root segments 6. Chronic periodontitis 7. Loose teeth 8. Excessive maxillary tuberosities   POSTOPERATIVE DIAGNOSES: 1. 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 Pre-chemoradiation therapy dental protocol 3. Chronic apical periodontitis 4. Dental caries 5. Retained root segments 6. Chronic periodontitis 7. Loose teeth 8. Excessive maxillary tuberosities   OPERATIONS: 1. Multiple extraction of tooth numbers 6-12, 15, 19-28, and 30 2. 4 Quadrants of alveoloplasty 3. Bilateral maxillary osseus tuberosity reductions   SURGEON: Lenn Cal, DDS  ASSISTANT: Camie Patience, (dental assistant)  ANESTHESIA: General anesthesia via nasoendotracheal tube.  MEDICATIONS: 1. Ancef 2 g IV prior to invasive dental procedures. 2. Local anesthesia with a total utilization of 6 carpules each containing 34 mg of lidocaine with 0.017 mg of epinephrine as well as 2 carpules each containing 9 mg of bupivacaine with 0.009 mg of epinephrine.  SPECIMENS: There are 19 teeth that were discarded.  DRAINS: None  CULTURES: None  COMPLICATIONS: None   ESTIMATED BLOOD LOSS: 125 mLs.  INTRAVENOUS FLUIDS: 750 mLs of Lactated ringers solution.  INDICATIONS: The patient was recently diagnosed with squamous cell carcinoma of the right neck.  A medically necessary dental consultation was then requested to evaluate poor dentition prior to anticipated chemoradiation therapy.  The patient was examined and treatment planned for extraction of remaining teeth with alveoloplasty and pre-prosthetic surgery as needed in the operating room with general anesthesia.  This treatment plan  was formulated to decrease the risks and complications associated with dental infection from affecting the patient's systemic health and to prevent future complications such as osteoradionecrosis.  OPERATIVE FINDINGS: Patient was examined operating room number 7.  The teeth were identified for extraction. The patient was noted be affected by chronic periodontitis, loose teeth, chronic apical periodontitis, dental caries, multiple retained root segments, bilateral maxillary excessive tuberosities, and malocclusion.   DESCRIPTION OF PROCEDURE: Patient was brought to the main operating room number 7. Patient was then placed in the supine position on the operating table. General anesthesia was then induced per the anesthesia team. The patient was then prepped and draped in the usual manner for dental medicine procedure. A timeout was performed. The patient was identified and procedures were verified. A throat pack was placed at this time. The oral cavity was then thoroughly examined with the findings noted above. The patient was then ready for dental medicine procedure as follows:  Local anesthesia was then administered sequentially with a total utilization of 6 carpules each containing 34 mg of lidocaine with 0.017 mg of epinephrine as well as 2 carpules  each containing 9 mg bupivacaine with 0.009 mg of epinephrine.  The Maxillary left and right quadrants first approached. Anesthesia was then delivered utilizing infiltration with lidocaine with epinephrine. A #15 blade incision was then made from the maxillary right tuberosity and extended to the maxillary left tuberosity.  A  surgical flap was then carefully reflected. The maxillary teeth were then subluxated with a series of straight elevators. Tooth numbers 6, 7, 8, 9, 10, 11, 12, and retained roots of tooth #15 were then removed with a 150 forceps without  complications. Alveoloplasty was then performed utilizing a ronguers and bone file. At this point  time the maxillary right and maxillary left tuberosities were approached. Multiple 15 blade incisions were made to remove redundant tissue as indicated.  Osseus tuberosity reductions were then performed utilizing a rongeur and bone file. The surgical site was then irrigated with copious amounts of sterile saline. The tissues were approximated and trimmed appropriately. The maxillary right surgical site was then closed from the maxillary right tuberosity and extended to the mesial of #8 utilizing 3-0 chromic gut suture in a continuous interrupted suture technique 1. The maxillary left surgical site was then closed from the maxillary left tuberosity and extended to the mesial of #9 utilizing 3-0 chromic gut suture in a continuous interrupted suture technique 1. 3 interrupted sutures were then placed utilizing 3-0 chromic material to further close the surgical site as needed.   At this point time, the mandibular quadrants were approached. The patient was given bilateral inferior alveolar nerve blocks and long buccal nerve blocks utilizing the bupivacaine with epinephrine. Further infiltration was then achieved utilizing the lidocaine with epinephrine. A 15 blade incision was then made from the distal of number 18 and extended to the distal of #31.  A surgical flap was then carefully reflected.  The lower teeth were then subluxated with a series of straight elevators. Tooth numbers 19, 20, 21, 22, 23, 24, 25, 26, 27, 28, and 30 were then removed utilizing a 151 forceps without complication. Alveoloplasty was then performed utilizing a rongeurs and bone file. The tissues were approximated and trimmed appropriately. The surgical sites were then irrigated with copious amounts of sterile saline 4. The mandibular left surgical site was then closed from the distal of #18 and extended the mesial #24 utilizing 3-0 chromic gut suture in a continuous interrupted fashion.  The mandibular right surgical site was then closed  from the distal of 31 and extended to the mesial of #25 utilizing 3-0 chromic gut suture in a continuous interrupted suture technique x1. 2 interrupted sutures are then placed to further closed surgical site as needed with 3-0 chromic gut material.  At this point time, the entire mouth was irrigated with copious amounts of sterile saline. The patient was examined for complications, seeing none, the dental medicine procedure was deemed to be complete. The throat pack was removed at this time. An oral airway was then placed at the request of the anesthesia team. A series of 4 x 4 gauze were placed in the mouth to aid hemostasis. The patient was then handed over to the anesthesia team for final disposition. After an appropriate amount of time, the patient was extubated and taken to the postanesthsia care unit in good condition. All counts were correct for the dental medicine procedure. Patient will be seen in approximately 7-10 days for evaluation for suture removal.  The patient should be ready for start of chemoradition therapy in two weeks.  Lenn Cal, DDS.

## 2016-01-13 NOTE — Progress Notes (Signed)
Sats 90%, deep breathing sats 92% to 95%. Encourage Pt to continue deep breathing and coughing often.Pt and friends verbalize understanding.   Report Pt status to Dr Kalman Shan to confirm discharge.

## 2016-01-13 NOTE — Anesthesia Procedure Notes (Addendum)
Procedure Name: Intubation Date/Time: 01/13/2016 10:26 AM Performed by: Glory Buff Pre-anesthesia Checklist: Patient identified, Emergency Drugs available, Suction available, Patient being monitored and Timeout performed Patient Re-evaluated:Patient Re-evaluated prior to inductionOxygen Delivery Method: Circle system utilized Preoxygenation: Pre-oxygenation with 100% oxygen Intubation Type: IV induction Ventilation: Two handed mask ventilation required, Oral airway inserted - appropriate to patient size and Nasal airway inserted- appropriate to patient size Laryngoscope Size: Glidescope and 3 Grade View: Grade I Nasal Tubes: Left, Nasal prep performed and Nasal Rae Tube size: 6.5 mm Number of attempts: 1 Airway Equipment and Method: Video-laryngoscopy Placement Confirmation: ETT inserted through vocal cords under direct vision,  positive ETCO2,  CO2 detector and breath sounds checked- equal and bilateral Secured at: 25 cm Tube secured with: Tape Dental Injury: Teeth and Oropharynx as per pre-operative assessment

## 2016-01-13 NOTE — Progress Notes (Signed)
PRE-OPERATIVE NOTE:  01/13/2016 Gina Frederick [id number]  VITALS: BP 141/80 mmHg  Pulse 103  Temp(Src) 98.4 F (36.9 C) (Oral)  Resp 18  Ht 5\' 4"  (1.626 m)  Wt 153 lb 2 oz (69.457 kg)  BMI 26.27 kg/m2  SpO2 95%  Lab Results  Component Value Date   WBC 8.3 01/10/2016   HGB 12.2 01/10/2016   HCT 37.4 01/10/2016   MCV 92.3 01/10/2016   PLT 435* 01/10/2016   BMET No results found for: NA, K, CL, CO2, GLUCOSE, BUN, CREATININE, CALCIUM, GFRNONAA, GFRAA  No results found for: INR, PROTIME No results found for: PTT   Gina Frederick presents for extraction of remaining teeth with alveoloplasty and pre-prosthetic surgery as needed in the operating room with general anesthesia. .   SUBJECTIVE: The patient denies any acute medical or dental changes and agrees to proceed with treatment as planned.  EXAM: No sign of acute dental changes.  ASSESSMENT: Patient is affected by chronic apical periodontitis, dental caries, retained root segments, chronic periodontitis, loose teeth, excessive maxillary tuberosities, and malocclusion.  PLAN: Patient agrees to proceed with treatment as planned in the operating room as previously discussed and accepts the risks, benefits, and complications of the proposed treatment. Patient is aware of the risk for bleeding, bruising, swelling, infection, pain, nerve damage, soft tissue damage, sinus involvement, root tip fracture, mandible fracture, and the risks of complications associated with the anesthesia. Patient also is aware of the potential for other complications up to and including death.   Lenn Cal, DDS

## 2016-01-14 ENCOUNTER — Other Ambulatory Visit (HOSPITAL_COMMUNITY): Payer: Self-pay | Admitting: *Deleted

## 2016-01-14 ENCOUNTER — Encounter: Payer: Self-pay | Admitting: Dietician

## 2016-01-14 ENCOUNTER — Other Ambulatory Visit: Payer: Self-pay | Admitting: Radiology

## 2016-01-14 ENCOUNTER — Other Ambulatory Visit: Payer: Self-pay | Admitting: Physician Assistant

## 2016-01-14 ENCOUNTER — Ambulatory Visit (HOSPITAL_COMMUNITY): Payer: Self-pay | Admitting: Hematology & Oncology

## 2016-01-14 DIAGNOSIS — C760 Malignant neoplasm of head, face and neck: Secondary | ICD-10-CM

## 2016-01-14 MED ORDER — ONDANSETRON HCL 8 MG PO TABS: 8.0000 mg | ORAL_TABLET | Freq: Three times a day (TID) | ORAL | Status: DC | PRN

## 2016-01-14 NOTE — Progress Notes (Signed)
60 year old female recently diagnosed with squamous cell carcinoma of the right neck. Other PMHx significant for HTN. She will receive chemoradiation and has PEG placement scheduled for 3/13. RD to assess for TF recs   Contacted Pt by Phone.   Wt Readings from Last 10 Encounters:  01/13/16 153 lb 2 oz (69.457 kg)  01/10/16 153 lb 2 oz (69.457 kg)  01/05/16 153 lb (69.4 kg)  12/28/15 153 lb 9.6 oz (69.673 kg)  12/21/15 153 lb (69.4 kg)   Patient weight has remained stable, though data is limited to 1 month.   Patient was very pleasant today. She seemed to be in a very good mood. She reports that she has been eating ice cream, pudding, ensure, yogurt, soup and other liquids since she had her teeth removed yesterday. She says her weight is stable at 153.   She states that she has no diarrhea, but has been having some constipation from the oxycodone. Given her constipation, would likely do best with fiber free formula.   RD went over his recommendations for formula (osmolite 1.5), daily amount (5 cans), and flush amount (150 ml before and after each feed). Expressed the high importance of feeding.   Pt states she doesn't know what home care agency she will be going with. RD notified [facility] of likelihood pt would become one of theirs.    Given that she is eating very well now, expressed that she can wean herself onto the tube feeding as her PO intake decrease over course of therapy. Suggested starting with 1 can a day and then adding an additional 2 cans for each meal she does not consume orally. When, she is unable to eat less than 2 meals, she should have fully transitioned to the 5 cans of TF each day.   Most recent anthropometrics:  5\' 4"  153 lbs BMI: 26.3  Estimated Needs while undergoing chemoradiation: 1700-1900 kcals (25-27 kcal/kg bw)  71-82 grams Protein (1.3-1.5 g/kg ibw) 1.8-2 liters fluid  TF recommendations: 5 cans of Osmolite 1.5/day. Split into 3 feeds. Flush w/ 150 ml free  water before and after each feeds.   TF to provide: 1778 kcals  75 g Pro 905 ml free water (+ 900 ml from flushes)  Mailed my contact info  and handouts titled "Feeding Tube Use and Care (Bolus/Syringe Feedings)".  Burtis Junes RD, LDN Nutrition Pager: [phone] 01/14/2016 2:11 PM

## 2016-01-17 ENCOUNTER — Ambulatory Visit (HOSPITAL_COMMUNITY)
Admission: RE | Admit: 2016-01-17 | Discharge: 2016-01-17 | Disposition: A | Discharge status: Home / Self Care | Payer: Medicaid Other | Source: Ambulatory Visit | Attending: Hematology & Oncology | Admitting: Hematology & Oncology

## 2016-01-17 ENCOUNTER — Telehealth: Payer: Self-pay | Admitting: *Deleted

## 2016-01-17 ENCOUNTER — Encounter (HOSPITAL_COMMUNITY): Payer: Self-pay

## 2016-01-17 ENCOUNTER — Other Ambulatory Visit (HOSPITAL_COMMUNITY): Payer: Self-pay | Admitting: Hematology & Oncology

## 2016-01-17 DIAGNOSIS — M19049 Primary osteoarthritis, unspecified hand: Secondary | ICD-10-CM | POA: Diagnosis not present

## 2016-01-17 DIAGNOSIS — Z79899 Other long term (current) drug therapy: Secondary | ICD-10-CM | POA: Diagnosis not present

## 2016-01-17 DIAGNOSIS — Z885 Allergy status to narcotic agent status: Secondary | ICD-10-CM | POA: Insufficient documentation

## 2016-01-17 DIAGNOSIS — C760 Malignant neoplasm of head, face and neck: Secondary | ICD-10-CM

## 2016-01-17 DIAGNOSIS — E785 Hyperlipidemia, unspecified: Secondary | ICD-10-CM | POA: Diagnosis not present

## 2016-01-17 DIAGNOSIS — I10 Essential (primary) hypertension: Secondary | ICD-10-CM | POA: Insufficient documentation

## 2016-01-17 DIAGNOSIS — F17210 Nicotine dependence, cigarettes, uncomplicated: Secondary | ICD-10-CM | POA: Insufficient documentation

## 2016-01-17 LAB — CBC WITH DIFFERENTIAL/PLATELET
BASOS PCT: 0 %
Basophils Absolute: 0 10*3/uL (ref 0.0–0.1)
EOS ABS: 0.1 10*3/uL (ref 0.0–0.7)
Eosinophils Relative: 1 %
HCT: 35.8 % — ABNORMAL LOW (ref 36.0–46.0)
HEMOGLOBIN: 11.5 g/dL — AB (ref 12.0–15.0)
Lymphocytes Relative: 13 %
Lymphs Abs: 1.3 10*3/uL (ref 0.7–4.0)
MCH: 30.7 pg (ref 26.0–34.0)
MCHC: 32.1 g/dL (ref 30.0–36.0)
MCV: 95.5 fL (ref 78.0–100.0)
MONOS PCT: 6 %
Monocytes Absolute: 0.6 10*3/uL (ref 0.1–1.0)
NEUTROS PCT: 80 %
Neutro Abs: 7.9 10*3/uL — ABNORMAL HIGH (ref 1.7–7.7)
PLATELETS: 462 10*3/uL — AB (ref 150–400)
RBC: 3.75 MIL/uL — ABNORMAL LOW (ref 3.87–5.11)
RDW: 13.7 % (ref 11.5–15.5)
WBC: 9.9 10*3/uL (ref 4.0–10.5)

## 2016-01-17 LAB — BASIC METABOLIC PANEL
Anion gap: 11 (ref 5–15)
BUN: 14 mg/dL (ref 6–20)
CALCIUM: 9.8 mg/dL (ref 8.9–10.3)
CO2: 28 mmol/L (ref 22–32)
CREATININE: 0.69 mg/dL (ref 0.44–1.00)
Chloride: 94 mmol/L — ABNORMAL LOW (ref 101–111)
GFR calc Af Amer: >60 mL/min (ref >60)
GFR calc non Af Amer: >60 mL/min (ref >60)
Glucose, Bld: 115 mg/dL — ABNORMAL HIGH (ref 65–99)
Potassium: 4.3 mmol/L (ref 3.5–5.1)
Sodium: 133 mmol/L — ABNORMAL LOW (ref 135–145)

## 2016-01-17 LAB — PROTIME-INR
INR: 1.01 (ref 0.00–1.49)
PROTHROMBIN TIME: 13.1 s (ref 11.6–15.2)

## 2016-01-17 MED ORDER — LIDOCAINE HCL 1 % IJ SOLN
INTRAMUSCULAR | Status: AC | PRN
  Administered 2016-01-17: 10 mL via INTRADERMAL

## 2016-01-17 MED ORDER — MIDAZOLAM HCL 2 MG/2ML IJ SOLN
INTRAMUSCULAR | Status: AC | PRN
  Administered 2016-01-17: 1 mg via INTRAVENOUS
  Administered 2016-01-17: 0.5 mg via INTRAVENOUS

## 2016-01-17 MED ORDER — FENTANYL CITRATE (PF) 100 MCG/2ML IJ SOLN
INTRAMUSCULAR | Status: AC | PRN
  Administered 2016-01-17 (×3): 25 ug via INTRAVENOUS
  Administered 2016-01-17: 50 ug via INTRAVENOUS

## 2016-01-17 MED ORDER — LIDOCAINE HCL 1 % IJ SOLN
INTRAMUSCULAR | Status: DC
Start: 2016-01-17 — End: 2016-01-18
  Filled 2016-01-17: qty 20

## 2016-01-17 MED ORDER — FENTANYL CITRATE (PF) 100 MCG/2ML IJ SOLN
INTRAMUSCULAR | Status: AC
  Filled 2016-01-17: qty 4

## 2016-01-17 MED ORDER — FENTANYL CITRATE (PF) 100 MCG/2ML IJ SOLN
INTRAMUSCULAR | Status: AC | PRN
  Administered 2016-01-17: 50 ug via INTRAVENOUS
  Administered 2016-01-17: 25 ug via INTRAVENOUS

## 2016-01-17 MED ORDER — MIDAZOLAM HCL 2 MG/2ML IJ SOLN
INTRAMUSCULAR | Status: AC
  Filled 2016-01-17: qty 6

## 2016-01-17 MED ORDER — GLUCAGON HCL RDNA (DIAGNOSTIC) 1 MG IJ SOLR
INTRAMUSCULAR | Status: AC
  Filled 2016-01-17: qty 1

## 2016-01-17 MED ORDER — LIDOCAINE-EPINEPHRINE 2 %-1:100000 IJ SOLN
INTRAMUSCULAR | Status: AC
  Filled 2016-01-17: qty 1

## 2016-01-17 MED ORDER — IOHEXOL 300 MG/ML  SOLN
50.0000 mL | Freq: Once | INTRAMUSCULAR | Status: AC | PRN
  Administered 2016-01-17: 12 mL

## 2016-01-17 MED ORDER — ACETAMINOPHEN 325 MG PO TABS
650.0000 mg | ORAL_TABLET | Freq: Once | ORAL | Status: AC
  Administered 2016-01-17: 650 mg via ORAL
  Filled 2016-01-17 (×2): qty 2

## 2016-01-17 MED ORDER — MIDAZOLAM HCL 2 MG/2ML IJ SOLN
INTRAMUSCULAR | Status: AC | PRN
  Administered 2016-01-17 (×5): 0.5 mg via INTRAVENOUS

## 2016-01-17 MED ORDER — SODIUM CHLORIDE 0.9 % IV SOLN: INTRAVENOUS | Status: DC

## 2016-01-17 MED ORDER — GLUCAGON HCL RDNA (DIAGNOSTIC) 1 MG IJ SOLR
INTRAMUSCULAR | Status: AC | PRN
  Administered 2016-01-17: 1 mg via INTRAVENOUS

## 2016-01-17 MED ORDER — CEFAZOLIN SODIUM-DEXTROSE 2-3 GM-% IV SOLR
INTRAVENOUS | Status: AC
  Filled 2016-01-17: qty 50

## 2016-01-17 MED ORDER — CEFAZOLIN SODIUM-DEXTROSE 2-3 GM-% IV SOLR
2.0000 g | INTRAVENOUS | Status: AC
  Administered 2016-01-17: 2 g via INTRAVENOUS

## 2016-01-17 MED ORDER — HEPARIN SOD (PORK) LOCK FLUSH 100 UNIT/ML IV SOLN
INTRAVENOUS | Status: AC
Start: 2016-01-17 — End: 2016-01-17
  Administered 2016-01-17: 500 [IU]
  Filled 2016-01-17: qty 5

## 2016-01-17 NOTE — Sedation Documentation (Signed)
Patient denies pain and is resting comfortably.  

## 2016-01-17 NOTE — H&P (Signed)
Chief Complaint: Patient was seen in consultation today for Port-A-Cath and percutaneous gastrostomy tube placements  Referring Physician(s): Penland,Shannon K  Supervising Physician: Daryll Brod  History of Present Illness:  Gina Frederick is a 60 y.o. 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  Past Medical History  Diagnosis Date  . Hypertension   . Arthritis     hands  . Neck mass     right neck(surfaced 11'16,then progressively enlarged"- denies swallwong or breathing difficulty.  . Hyperlipidemia   . Cancer ([place])     dx. squamous cell carcinoma-right neck    Past Surgical History  Procedure Laterality Date  . Tonsillectomy    . Knee arthroscopy      right  . Cesarean section    . Mass biopsy Right 12/21/2015    Procedure: NECK MASS BIOPSY;  Surgeon: Leta Baptist, MD;  Location: [facility];  Service: ENT;  Laterality: Right;  . Panendoscopy N/A 12/21/2015    Procedure: PANENDOSCOPY;  Surgeon: Leta Baptist, MD;  Location: [facility];  Service: ENT;  Laterality: N/A;  . Multiple extractions with alveoloplasty N/A 01/13/2016    Procedure: EXTRACTION OF TOOTH #'S 6-12,15, 19-28, 30 WITH ALVEOLOPLASTY AND BILATERAL MAXILLARY TUBEROSITY REDUCTIONS;  Surgeon: Lenn Cal, DDS;  Location: [facility];  Service: Oral Surgery;  Laterality: N/A;    Allergies: Codeine  Medications: Prior to Admission medications   Medication Sig Start Date End Date Taking? Authorizing Provider  acetaminophen (TYLENOL) 500 MG tablet Take 500 mg by mouth every 6 (six) hours as needed.   Yes Historical Provider, MD  diphenhydrAMINE (BENADRYL) 25 MG tablet Take 25 mg by mouth every 6 (six) hours as needed.   Yes Historical Provider, MD  lisinopril-hydrochlorothiazide (PRINZIDE,ZESTORETIC) 20-12.5 MG tablet Take 1  tablet by mouth daily.   Yes Historical Provider, MD  lovastatin (MEVACOR) 20 MG tablet Take 20 mg by mouth at bedtime.   Yes Historical Provider, MD  naproxen sodium (ANAPROX) 220 MG tablet Take 220 mg by mouth 2 (two) times daily as needed (Pain).   Yes Historical Provider, MD  ondansetron (ZOFRAN) 8 MG tablet Take 1 tablet (8 mg total) by mouth every 8 (eight) hours as needed for nausea or vomiting. 01/14/16  Yes Patrici Ranks, MD  oxyCODONE-acetaminophen (PERCOCET) 5-325 MG tablet Take one or two tablets by mouth every 6 hours as needed for pain. 01/13/16  Yes Lenn Cal, DDS  Tetrahydrozoline-Zn Sulfate (EYE DROPS ALLERGY RELIEF OP) Apply 1 drop to eye 2 (two) times daily as needed (Itching eyes).   Yes Historical Provider, MD     Family History  Problem Relation Age of Onset  . Alzheimer's disease Mother   . Congestive Heart Failure Father     Social History   Social History  . Marital Status: Divorced    Spouse Name: N/A  . Number of Children: 1  . Years of Education: N/A   Social History Main Topics  . Smoking status: Current Every Day Smoker -- 0.25 packs/day    Types: Cigarettes  . Smokeless tobacco: Never Used     Comment: cut back to 1 cigarette per day  . Alcohol Use: No  . Drug Use: No  . Sexual Activity: No   Other Topics Concern  . None   Social History Narrative  Review of Systems  Constitutional: Negative for fever and chills.  HENT: Positive for trouble swallowing.   Respiratory: Positive for cough. Negative for shortness of breath.   Cardiovascular: Negative for chest pain.  Gastrointestinal: Negative for nausea, vomiting, abdominal pain and blood in stool.  Genitourinary: Negative for dysuria and hematuria.  Musculoskeletal: Positive for neck pain. Negative for back pain.  Neurological:       Occ HA's    Vital Signs: BP 134/78 mmHg  Pulse 104  Temp(Src) 98.7 F (37.1 C) (Oral)  Resp 16  SpO2 94%  Physical Exam    Constitutional: She is oriented to person, place, and time. She appears well-developed and well-nourished.  Neck:  Large firm rt neck mass, tender to palpation  Cardiovascular: Normal rate and regular rhythm.   Pulmonary/Chest: Effort normal.  Distant BS bilat  Abdominal: Soft. Bowel sounds are normal. There is no tenderness.  Musculoskeletal: Normal range of motion. She exhibits no edema.  Neurological: She is alert and oriented to person, place, and time.    Mallampati Score:     Imaging: Ct Chest W Contrast  01/03/2016  CLINICAL DATA:  Squamous cell cancer the head and neck. EXAM: CT CHEST, ABDOMEN, AND PELVIS WITH CONTRAST TECHNIQUE: Multidetector CT imaging of the chest, abdomen and pelvis was performed following the standard protocol during bolus administration of intravenous contrast. CONTRAST:  173mL OMNIPAQUE IOHEXOL 300 MG/ML  SOLN COMPARISON:  None. FINDINGS: CT CHEST FINDINGS Mediastinum/Lymph Nodes: There is no axillary lymphadenopathy. There is no hilar lymphadenopathy. No mediastinal lymphadenopathy. Heart size is normal The esophagus has normal imaging features. Lungs/Pleura: Centrilobular emphysema noted in the lung apices. 9 mm ground-glass nodule posterior right lung apex likely related to scarring. Musculoskeletal: 6.4 x 8.1 cm right cervical mass noted in the lower neck. Bone windows reveal no worrisome lytic or sclerotic osseous lesions. CT ABDOMEN PELVIS FINDINGS Hepatobiliary: No focal abnormality within the liver parenchyma. Numerous gallstones measure up to 5 mm diameter. Common bile duct is 9 mm in diameter at the level of the pancreatic head. Pancreas: No focal mass lesion. No dilatation of the main duct. No intraparenchymal cyst. No peripancreatic edema. Spleen: No splenomegaly. No focal mass lesion. Adrenals/Urinary Tract: No adrenal nodule or mass. Adrenal thickening is noted bilaterally. Kidneys are unremarkable. No evidence for hydroureter. The urinary bladder  appears normal for the degree of distention. Stomach/Bowel: Stomach is nondistended. No gastric wall thickening. No evidence of outlet obstruction. Duodenum is normally positioned as is the ligament of Treitz. No small bowel wall thickening. No small bowel dilatation. The terminal ileum is normal. The appendix is normal. Diverticular changes are noted in the left colon without evidence of diverticulitis. Vascular/Lymphatic: There is abdominal aortic atherosclerosis without aneurysm. There is no gastrohepatic or hepatoduodenal ligament lymphadenopathy. No intraperitoneal or retroperitoneal lymphadenopathy. No pelvic sidewall lymphadenopathy. Reproductive: Uterus is surgically absent. There is no adnexal mass. Other: No intraperitoneal free fluid. Musculoskeletal: Bone windows reveal no worrisome lytic or sclerotic osseous lesions. IMPRESSION: 1. Large right-sided neck mass. 2. No evidence for lymphadenopathy or metastatic disease in the chest, abdomen, or pelvis. 3. 9 mm ground-glass nodule in the posterior right lung apex is probably related to scarring given the underlying emphysema. Attention on follow-up is recommended. 4. Cholelithiasis. 5. Abdominal aortic atherosclerosis. Electronically Signed   By: Misty Stanley M.D.   On: 01/03/2016 08:45   Nm Bone Scan Whole Body  01/04/2016  CLINICAL DATA:  Head neck cancer. EXAM: NUCLEAR MEDICINE WHOLE BODY BONE SCAN TECHNIQUE:  Whole body anterior and posterior images were obtained approximately 3 hours after intravenous injection of radiopharmaceutical. RADIOPHARMACEUTICALS:  CTs 01/03/2016 . MCi Technetium-37m MDP IV COMPARISON:  None. FINDINGS: Bilateral renal function and excretion. No acute bony abnormality identified. Increased activity noted over the right foot, clinical correlation suggested. These changes could be degenerative. No evidence of metastatic disease. IMPRESSION: 1. No evidence of metastatic disease. 2. Increased activity noted over the right foot,  clinical correlation suggested. These changes may be degenerative . Electronically Signed   By: Marcello Moores  Register   On: 01/04/2016 12:16   Ct Abdomen Pelvis W Contrast  01/03/2016  CLINICAL DATA:  Squamous cell cancer the head and neck. EXAM: CT CHEST, ABDOMEN, AND PELVIS WITH CONTRAST TECHNIQUE: Multidetector CT imaging of the chest, abdomen and pelvis was performed following the standard protocol during bolus administration of intravenous contrast. CONTRAST:  122mL OMNIPAQUE IOHEXOL 300 MG/ML  SOLN COMPARISON:  None. FINDINGS: CT CHEST FINDINGS Mediastinum/Lymph Nodes: There is no axillary lymphadenopathy. There is no hilar lymphadenopathy. No mediastinal lymphadenopathy. Heart size is normal The esophagus has normal imaging features. Lungs/Pleura: Centrilobular emphysema noted in the lung apices. 9 mm ground-glass nodule posterior right lung apex likely related to scarring. Musculoskeletal: 6.4 x 8.1 cm right cervical mass noted in the lower neck. Bone windows reveal no worrisome lytic or sclerotic osseous lesions. CT ABDOMEN PELVIS FINDINGS Hepatobiliary: No focal abnormality within the liver parenchyma. Numerous gallstones measure up to 5 mm diameter. Common bile duct is 9 mm in diameter at the level of the pancreatic head. Pancreas: No focal mass lesion. No dilatation of the main duct. No intraparenchymal cyst. No peripancreatic edema. Spleen: No splenomegaly. No focal mass lesion. Adrenals/Urinary Tract: No adrenal nodule or mass. Adrenal thickening is noted bilaterally. Kidneys are unremarkable. No evidence for hydroureter. The urinary bladder appears normal for the degree of distention. Stomach/Bowel: Stomach is nondistended. No gastric wall thickening. No evidence of outlet obstruction. Duodenum is normally positioned as is the ligament of Treitz. No small bowel wall thickening. No small bowel dilatation. The terminal ileum is normal. The appendix is normal. Diverticular changes are noted in the left  colon without evidence of diverticulitis. Vascular/Lymphatic: There is abdominal aortic atherosclerosis without aneurysm. There is no gastrohepatic or hepatoduodenal ligament lymphadenopathy. No intraperitoneal or retroperitoneal lymphadenopathy. No pelvic sidewall lymphadenopathy. Reproductive: Uterus is surgically absent. There is no adnexal mass. Other: No intraperitoneal free fluid. Musculoskeletal: Bone windows reveal no worrisome lytic or sclerotic osseous lesions. IMPRESSION: 1. Large right-sided neck mass. 2. No evidence for lymphadenopathy or metastatic disease in the chest, abdomen, or pelvis. 3. 9 mm ground-glass nodule in the posterior right lung apex is probably related to scarring given the underlying emphysema. Attention on follow-up is recommended. 4. Cholelithiasis. 5. Abdominal aortic atherosclerosis. Electronically Signed   By: Misty Stanley M.D.   On: 01/03/2016 08:45    Labs:  CBC:  Recent Labs  01/10/16 0910 01/17/16 1214  WBC 8.3 9.9  HGB 12.2 11.5*  HCT 37.4 35.8*  PLT 435* 462*    COAGS:  Recent Labs  01/17/16 1214  INR 1.01    BMP: No results for input(s): NA, K, CL, CO2, GLUCOSE, BUN, CALCIUM, CREATININE, GFRNONAA, GFRAA in the last 8760 hours.  Invalid input(s): CMP  LIVER FUNCTION TESTS: No results for input(s): BILITOT, AST, ALT, ALKPHOS, PROT, ALBUMIN in the last 8760 hours.  TUMOR MARKERS: No results for input(s): AFPTM, CEA, CA199, CHROMGRNA in the last 8760 hours.  Assessment and Plan: 60 y.o.  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 Details/risks of procedures, including not limited to, internal bleeding, infection, injury to adjacent organs, discussed with patient/friends with her understanding and consent.   Thank you for this interesting consult.  I greatly enjoyed meeting  Gina Frederick and look forward to participating in their care.  A copy of this report was sent to the requesting provider on this date.  Electronically Signed: D. Rowe Robert 01/17/2016, 12:50 PM   I spent a total of 15 minutes in face to face in clinical consultation, greater than 50% of which was counseling/coordinating care for Port-A-Cath and percutaneous gastrostomy tube placements

## 2016-01-17 NOTE — Procedures (Signed)
Successful 20 FR PULL THRU GTUBE No comp Stable Ready for use Full report in PACS

## 2016-01-17 NOTE — Discharge Instructions (Signed)
Care of a Feeding Tube People who have trouble swallowing or cannot take food or medicine by mouth are sometimes given feeding tubes. A feeding tube can go into the nose and down to the stomach or through the skin in the abdomen and into the stomach or small bowel. Some of the names of these feeding tubes are gastrostomy tubes, PEG lines, nasogastric tubes, and gastrojejunostomy tubes.  SUPPLIES NEEDED TO CARE FOR THE TUBE SITE  Clean gloves.  Clean wash cloth, gauze pads, or soft paper towel.  Cotton swabs.  Skin barrier ointment or cream.  Soap and water.  Pre-cut foam pads or gauze (that go around the tube).  Tube tape. TUBE SITE CARE 1. Have all supplies ready and available. 2. Wash hands well. 3. Put on clean gloves. 4. Remove the soiled foam pad or gauze, if present, that is found under the tube stabilizer. Change the foam pad or gauze daily or when soiled or moist. 5. Check the skin around the tube site for redness, rash, swelling, drainage, or extra tissue growth. If you notice any of these, call your caregiver. 6. Moisten gauze and cotton swabs with water and soap. 7. Wipe the area closest to the tube (right near the stoma) with cotton swabs. Wipe the surrounding skin with moistened gauze. Rinse with water. 8. Dry the skin and stoma site with a dry gauze pad or soft paper towel. Do not use antibiotic ointments at the tube site. 9. If the skin is red, apply a skin barrier cream or ointment (such as petroleum jelly) in a circular motion, using a cotton swab. The cream or ointment will provide a moisture barrier for the skin and helps with wound healing. 10. Apply a new pre-cut foam pad or gauze around the tube. Secure it with tape around the edges. If no drainage is present, foam pads or gauze may be left off. 11. Use tape or an anchoring device to fasten the feeding tube to the skin for comfort or as directed. Rotate where you tape the tube to avoid skin damage from the  adhesive. 12. Position the person in a semi-upright position (30-45 degree angle). 13. Throw away used supplies. 14. Remove gloves. 15. Wash hands. SUPPLIES NEEDED TO FLUSH A FEEDING TUBE  Clean gloves.  60 mL syringe (that connects to the feeding tube).  Towel.  Water. FLUSHING A FEEDING TUBE  1. Have all supplies ready and available. 2. Wash hands well. 3. Put on clean gloves. 4. Draw up 30 mL of water in the syringe. 5. Kink the feeding tube while disconnecting it from the feeding-bag tubing or while removing the plug at the end of the tube. Kinking closes the tube and prevents secretions in the tube from spilling out. 6. Insert the tip of the syringe into the end of the feeding tube. Release the kink. Slowly inject the water. 7. If unable to inject the water, the person with the feeding tube should lay on his or her left side. The tip of the tube may be against the stomach wall, blocking fluid flow. Changing positions may move the tip away from the stomach wall. After repositioning, try injecting the water again. 8. After injecting the water, remove the syringe. 9. Always flush before giving the first medicine, between medicines, and after the final medicine before starting a feeding. This prevents medicines from clogging the tube. 10. Throw away used supplies. 11. Remove gloves. 12. Wash hands.   This information is not intended to replace   advice given to you by your health care provider. Make sure you discuss any questions you have with your health care provider.   Document Released: 10/23/2005 Document Revised: 10/09/2012 Document Reviewed: 06/06/2012 Elsevier Interactive Patient Education 2016 [place]. Gastrostomy Tube Home Guide, Adult A gastrostomy tube is a tube that is surgically placed into the stomach. It is also called a "G-tube." G-tubes are used when a person is unable to eat and drink enough on their own to stay healthy. The tube is inserted into the stomach  through a small cut (incision) in the skin. This tube is used for:  Feeding.  Giving medication. GASTROSTOMY TUBE CARE 16. Wash your hands with soap and water. 23. Remove the old dressing (if any). Some styles of G-tubes may need a dressing inserted between the skin and the G-tube. Other types of G-tubes do not require a dressing. Ask your health care provider if a dressing is needed. 18. Check the area where the tube enters the skin (insertion site) for redness, swelling, or pus-like (purulent) drainage. A small amount of clear or tan liquid drainage is normal. Check to make sure scar tissue (skin) is not growing around the insertion site. This could have a raised, bumpy appearance. 19. A cotton swab can be used to clean the skin around the tube: 1. When the G-tube is first put in, a normal saline solution or water can be used to clean the skin. 2. Mild soap and warm water can be used when the skin around the G-tube site has healed. 3. Roll the cotton swab around the G-tube insertion site to remove any drainage or crusting at the insertion site. STOMACH RESIDUALS Feeding tube residuals are the amount of liquids that are in the stomach at any given time. Residuals may be checked before giving feedings, medications, or as instructed by your health care provider.  Ask your health care provider if there are instances when you would not start tube feedings depending on the amount or type of contents withdrawn from the stomach.  Check residuals by attaching a syringe to the G-tube and pulling back on the syringe plunger. Note the amount, and return the residual back into the stomach. FLUSHING THE G-TUBE 13. The G-tube should be periodically flushed with clean warm water to keep it from clogging.  Flush the G-tube after feedings or medications. Draw up 30 mL of warm water in a syringe. Connect the syringe to the G-tube and slowly push the water into the tube.  Do not push feedings, medications, or  flushes rapidly. Flush the G-tube gently and slowly.  Only use syringes made for G-tubes to flush medications or feedings.  Your health care provider may want the G-tube flushed more often or with more water. If this is the case, follow your health care provider's instructions. FEEDINGS Your health care provider will determine whether feedings are given as a bolus (a certain amount given at one time and at scheduled times) or whether feedings will be given continuously on a feeding pump.   Formulas should be given at room temperature.  If feedings are continuous, no more than 4 hours worth of feedings should be placed in the feeding bag. This helps prevent spoilage or accidental excess infusion.  Cover and place unused formula in the refrigerator.  If feedings are continuous, stop the feedings when medications or flushes are given. Be sure to restart the feedings.  Feeding bags and syringes should be replaced as instructed by your health care provider.  GIVING MEDICATION   In general, it is best if all medications are in a liquid form for G-tube administration. Liquid medications are less likely to clog the G-tube.  Mix the liquid medication with 30 mL (or amount recommended by your health care provider) of warm water.  Draw up the medication into the syringe.  Attach the syringe to the G-tube and slowly push the mixture into the G-tube.  After giving the medication, draw up 30 mL of warm water in the syringe and slowly flush the G-tube.  For pills or capsules, check with your health care provider first before crushing medications. Some pills are not effective if they are crushed. Some capsules are sustained-release medications.  If appropriate, crush the pill or capsule and mix with 30 mL of warm water. Using the syringe, slowly push the medication through the tube, then flush the tube with another 30 mL of tap water. G-TUBE PROBLEMS G-tube was pulled out.  Cause: May have been  pulled out accidentally.  Solutions: Cover the opening with clean dressing and tape. Call your health care provider right away. The G-tube should be put in as soon as possible (within 4 hours) so the G-tube opening (tract) does not close. The G-tube needs to be put in at a health care setting. An X-ray needs to be done to confirm placement before the G-tube can be used again. Redness, irritation, soreness, or foul odor around the gastrostomy site.  Cause: May be caused by leakage or infection.  Solutions: Call your health care provider right away. Large amount of leakage of fluid or mucus-like liquid present (a large amount means it soaks clothing).  Cause: Many reasons could cause the G-tube to leak.  Solutions: Call your health care provider to discuss the amount of leakage. Skin or scar tissue appears to be growing where tube enters skin.   Cause: Tissue growth may develop around the insertion site if the G-tube is moved or pulled on excessively.  Solutions: Secure tube with tape so that excess movement does not occur. Call your health care provider. G-tube is clogged.  Cause: Thick formula or medication.  Solutions: Try to slowly push warm water into the tube with a large syringe. Never try to push any object into the tube to unclog it. Do not force fluid into the G-tube. If you are unable to unclog the tube, call your health care provider right away. TIPS  Head of bed (HOB) position refers to the upright position of a person's upper body.  When giving medications or a feeding bolus, keep the [facility] up as told by your health care provider. Do this during the feeding and for 1 hour after the feeding or medication administration.  If continuous feedings are being given, it is best to keep the [facility] up as told by your health care provider. When ADLs (activities of daily living) are performed and the [facility] needs to be flat, be sure to turn the feeding pump off. Restart the feeding pump when the  [facility] is returned to the recommended height.  Do not pull or put tension on the tube.  To prevent fluid backflow, kink the G-tube before removing the cap or disconnecting a syringe.  Check the G-tube length every day. Measure from the insertion site to the end of the G-tube. If the length is longer than previous measurements, the tube may be coming out. Call your health care provider if you notice increasing G-tube length.  Oral care, such as brushing teeth,  must be continued.  You may need to remove excess air (vent) from the G-tube. Your health care provider will tell you if this is needed.  Always call your health care provider if you have questions or problems with the G-tube. SEEK IMMEDIATE MEDICAL CARE IF:   You have severe abdominal pain, tenderness, or abdominal bloating (distension).  You have nausea or vomiting.  You are constipated or have problems moving your bowels.  The G-tube insertion site is red, swollen, has a foul smell, or has yellow or brown drainage.  You have difficulty breathing or shortness of breath.  You have a fever.  You have a large amount of feeding tube residuals.  The G-tube is clogged and cannot be flushed. MAKE SURE YOU:   Understand these instructions.  Will watch your condition.  Will get help right away if you are not doing well or get worse.   This information is not intended to replace advice given to you by your health care provider. Make sure you discuss any questions you have with your health care provider.   Document Released: 01/01/2002 Document Revised: 03/09/2015 Document Reviewed: 06/30/2013 Elsevier Interactive Patient Education 2016 [place] Insertion, Care After Refer to this sheet in the next few weeks. These instructions provide you with information on caring for yourself after your procedure. Your health care provider may also give you more specific instructions. Your treatment has been planned  according to current medical practices, but problems sometimes occur. Call your health care provider if you have any problems or questions after your procedure. WHAT TO EXPECT AFTER THE PROCEDURE After your procedure, it is typical to have the following:   Discomfort at the port insertion site. Ice packs to the area will help.  Bruising on the skin over the port. This will subside in 3-4 days. HOME CARE INSTRUCTIONS 20. After your port is placed, you will get a manufacturer's information card. The card has information about your port. Keep this card with you at all times.  21. Know what kind of port you have. There are many types of ports available.  72. Wear a medical alert bracelet in case of an emergency. This can help alert health care workers that you have a port.  23. The port can stay in for as long as your health care provider believes it is necessary.  24. A home health care nurse may give medicines and take care of the port.  76. You or a family member can get special training and directions for giving medicine and taking care of the port at home.  SEEK MEDICAL CARE IF:   Your port does not flush or you are unable to get a blood return.   You have a fever or chills. SEEK IMMEDIATE MEDICAL CARE IF: 14. You have new fluid or pus coming from your incision.  15. You notice a bad smell coming from your incision site.  16. You have swelling, pain, or more redness at the incision or port site.  17. You have chest pain or shortness of breath.   This information is not intended to replace advice given to you by your health care provider. Make sure you discuss any questions you have with your health care provider.   Document Released: 08/13/2013 Document Revised: 10/28/2013 Document Reviewed: 08/13/2013 Elsevier Interactive Patient Education 2016 [place] An implanted port is a type of central line that is placed under the skin. Central lines  are  used to provide IV access when treatment or nutrition needs to be given through a person's veins. Implanted ports are used for long-term IV access. An implanted port may be placed because:   You need IV medicine that would be irritating to the small veins in your hands or arms.   You need long-term IV medicines, such as antibiotics.   You need IV nutrition for a long period.   You need frequent blood draws for lab tests.   You need dialysis.  Implanted ports are usually placed in the chest area, but they can also be placed in the upper arm, the abdomen, or the leg. An implanted port has two main parts:  26. Reservoir. The reservoir is round and will appear as a small, raised area under your skin. The reservoir is the part where a needle is inserted to give medicines or draw blood.  27. Catheter. The catheter is a thin, flexible tube that extends from the reservoir. The catheter is placed into a large vein. Medicine that is inserted into the reservoir goes into the catheter and then into the vein.  HOW WILL I CARE FOR MY INCISION SITE? Do not get the incision site wet. Bathe or shower as directed by your health care provider.  HOW IS MY PORT ACCESSED? Special steps must be taken to access the port:   Before the port is accessed, a numbing cream can be placed on the skin. This helps numb the skin over the port site.   Your health care provider uses a sterile technique to access the port.  Your health care provider must put on a mask and sterile gloves.  The skin over your port is cleaned carefully with an antiseptic and allowed to dry.  The port is gently pinched between sterile gloves, and a needle is inserted into the port.  Only "non-coring" port needles should be used to access the port. Once the port is accessed, a blood return should be checked. This helps ensure that the port is in the vein and is not clogged.   If your port needs to remain accessed for a constant  infusion, a clear (transparent) bandage will be placed over the needle site. The bandage and needle will need to be changed every week, or as directed by your health care provider.   Keep the bandage covering the needle clean and dry. Do not get it wet. Follow your health care provider's instructions on how to take a shower or bath while the port is accessed.   If your port does not need to stay accessed, no bandage is needed over the port.  WHAT IS FLUSHING? Flushing helps keep the port from getting clogged. Follow your health care provider's instructions on how and when to flush the port. Ports are usually flushed with saline solution or a medicine called heparin. The need for flushing will depend on how the port is used.  18. If the port is used for intermittent medicines or blood draws, the port will need to be flushed:   After medicines have been given.   After blood has been drawn.   As part of routine maintenance.  19. If a constant infusion is running, the port may not need to be flushed.  HOW LONG WILL MY PORT STAY IMPLANTED? The port can stay in for as long as your health care provider thinks it is needed. When it is time for the port to come out, surgery will be done to remove it.  The procedure is similar to the one performed when the port was put in.  WHEN SHOULD I SEEK IMMEDIATE MEDICAL CARE? When you have an implanted port, you should seek immediate medical care if:   You notice a bad smell coming from the incision site.   You have swelling, redness, or drainage at the incision site.   You have more swelling or pain at the port site or the surrounding area.   You have a fever that is not controlled with medicine.   This information is not intended to replace advice given to you by your health care provider. Make sure you discuss any questions you have with your health care provider.   Document Released: 10/23/2005 Document Revised: 08/13/2013 Document Reviewed:  06/30/2013 Elsevier Interactive Patient Education 2016 [employer] Moderate Conscious Sedation, Adult, Care After Refer to this sheet in the next few weeks. These instructions provide you with information on caring for yourself after your procedure. Your health care provider may also give you more specific instructions. Your treatment has been planned according to current medical practices, but problems sometimes occur. Call your health care provider if you have any problems or questions after your procedure. WHAT TO EXPECT AFTER THE PROCEDURE  After your procedure:  You may feel sleepy, clumsy, and have poor balance for several hours.  Vomiting may occur if you eat too soon after the procedure. HOME CARE INSTRUCTIONS 28. Do not participate in any activities where you could become injured for at least 24 hours. Do not: 1. Drive. 2. Swim. 3. Ride a bicycle. 4. Operate heavy machinery. [place]. 6. Use power tools. 7. Climb ladders. 8. Work from a high place. 61. Do not make important decisions or sign legal documents until you are improved. 30. If you vomit, drink water, juice, or soup when you can drink without vomiting. Make sure you have little or no nausea before eating solid foods. 31. Only take over-the-counter or prescription medicines for pain, discomfort, or fever as directed by your health care provider. 48. Make sure you and your family fully understand everything about the medicines given to you, including what side effects may occur. 33. You should not drink alcohol, take sleeping pills, or take medicines that cause drowsiness for at least 24 hours. 34. If you smoke, do not smoke without supervision. 35. If you are feeling better, you may resume normal activities 24 hours after you were sedated. 1. Keep all appointments with your health care provider. SEEK MEDICAL CARE IF:  Your skin is pale or bluish in color.  You continue to feel nauseous or vomit.  Your pain is  getting worse and is not helped by medicine.  You have bleeding or swelling.  You are still sleepy or feeling clumsy after 24 hours. SEEK IMMEDIATE MEDICAL CARE IF: 20. You develop a rash. 21. You have difficulty breathing. 22. You develop any type of allergic problem. 23. You have a fever. MAKE SURE YOU:  Understand these instructions.  Will watch your condition.  Will get help right away if you are not doing well or get worse.   This information is not intended to replace advice given to you by your health care provider. Make sure you discuss any questions you have with your health care provider.   Document Released: 08/13/2013 Document Revised: 11/13/2014 Document Reviewed: 08/13/2013 Elsevier Interactive Patient Education [employer].

## 2016-01-17 NOTE — Progress Notes (Signed)
Per previous nurse Davy Pique, [facility] (nurse Romualdo Bolk, RN) is aware of the referral for this patient for the GTube maintenance and tube feedings.

## 2016-01-17 NOTE — Procedures (Signed)
Successful LT IJ POWER PORT TIP SVC/RA NO COMP STABLE READY FOR USE  

## 2016-01-17 NOTE — Discharge Instructions (Signed)
Moderate Conscious Sedation, Adult, Care After °Refer to this sheet in the next few weeks. These instructions provide you with information on caring for yourself after your procedure. Your health care provider may also give you more specific instructions. Your treatment has been planned according to current medical practices, but problems sometimes occur. Call your health care provider if you have any problems or questions after your procedure. °WHAT TO EXPECT AFTER THE PROCEDURE  °After your procedure: °· You may feel sleepy, clumsy, and have poor balance for several hours. °· Vomiting may occur if you eat too soon after the procedure. °HOME CARE INSTRUCTIONS °· Do not participate in any activities where you could become injured for at least 24 hours. Do not: °¨ Drive. °¨ Swim. °¨ Ride a bicycle. °¨ Operate heavy machinery. °¨ Cook. °¨ Use power tools. °¨ Climb ladders. °¨ Work from a high place. °· Do not make important decisions or sign legal documents until you are improved. °· If you vomit, drink water, juice, or soup when you can drink without vomiting. Make sure you have little or no nausea before eating solid foods. °· Only take over-the-counter or prescription medicines for pain, discomfort, or fever as directed by your health care provider. °· Make sure you and your family fully understand everything about the medicines given to you, including what side effects may occur. °· You should not drink alcohol, take sleeping pills, or take medicines that cause drowsiness for at least 24 hours. °· If you smoke, do not smoke without supervision. °· If you are feeling better, you may resume normal activities 24 hours after you were sedated. °· Keep all appointments with your health care provider. °SEEK MEDICAL CARE IF: °· Your skin is pale or bluish in color. °· You continue to feel nauseous or vomit. °· Your pain is getting worse and is not helped by medicine. °· You have bleeding or swelling. °· You are still  sleepy or feeling clumsy after 24 hours. °SEEK IMMEDIATE MEDICAL CARE IF: °· You develop a rash. °· You have difficulty breathing. °· You develop any type of allergic problem. °· You have a fever. °MAKE SURE YOU: °· Understand these instructions. °· Will watch your condition. °· Will get help right away if you are not doing well or get worse. °  °This information is not intended to replace advice given to you by your health care provider. Make sure you discuss any questions you have with your health care provider. °  °Document Released: 08/13/2013 Document Revised: 11/13/2014 Document Reviewed: 08/13/2013 °Elsevier Interactive Patient Education ©2016 [employer] °Implanted Port Insertion, Care After °Refer to this sheet in the next few weeks. These instructions provide you with information on caring for yourself after your procedure. Your health care provider may also give you more specific instructions. Your treatment has been planned according to current medical practices, but problems sometimes occur. Call your health care provider if you have any problems or questions after your procedure. °WHAT TO EXPECT AFTER THE PROCEDURE °After your procedure, it is typical to have the following:  °· Discomfort at the port insertion site. Ice packs to the area will help. °· Bruising on the skin over the port. This will subside in 3-4 days. °HOME CARE INSTRUCTIONS °· After your port is placed, you will get a manufacturer's information card. The card has information about your port. Keep this card with you at all times.   °· Know what kind of port you have. There are many types   of ports available.   Wear a medical alert bracelet in case of an emergency. This can help alert health care workers that you have a port.   The port can stay in for as long as your health care provider believes it is necessary.   A home health care nurse may give medicines and take care of the port.   You or a family member can get  special training and directions for giving medicine and taking care of the port at home.  SEEK MEDICAL CARE IF:   Your port does not flush or you are unable to get a blood return.   You have a fever or chills. SEEK IMMEDIATE MEDICAL CARE IF:  You have new fluid or pus coming from your incision.   You notice a bad smell coming from your incision site.   You have swelling, pain, or more redness at the incision or port site.   You have chest pain or shortness of breath.   This information is not intended to replace advice given to you by your health care provider. Make sure you discuss any questions you have with your health care provider.   Document Released: 08/13/2013 Document Revised: 10/28/2013 Document Reviewed: 08/13/2013 Elsevier Interactive Patient Education 2016 [place]. Gastrostomy Tube Replacement, Care After Refer to this sheet in the next few weeks. These instructions provide you with information on caring for yourself after your procedure. Your health care provider may also give you more specific instructions. Your treatment has been planned according to current medical practices, but problems sometimes occur. Call your health care provider if you have any problems or questions after your procedure. WHAT TO EXPECT AFTER THE PROCEDURE  After your procedure, it is typical to have the following:   Mild abdominal pain.  A small amount of blood-tinged fluid leaking from the replacement site. HOME CARE INSTRUCTIONS  You may resume your normal level of activity.  You may resume your normal feedings.  Care for your gastrostomy tube as you did before, or as directed by your health care provider. SEEK MEDICAL CARE IF:  You have a fever or chills.  You have redness or irritation near the insertion site.  You continue to have abdominal pain or leaking around your gastrostomy tube. SEEK IMMEDIATE MEDICAL CARE IF:   You develop bleeding or significant discharge  around the tube.  You have severe abdominal pain.  Your new tube does not seem to be working properly.  You are unable to get feedings into the tube.  Your tube comes out for any reason.    For the next 7 days clean gtube site with soap and water daily.  Apply triple antibiotic ointment around gtube site and apply dry gauze around site.  Change daily.

## 2016-01-18 NOTE — Telephone Encounter (Signed)
  Oncology Nurse Navigator Documentation  Navigator Location: [facility] (01/17/16 [id number]) Navigator Encounter Type: Telephone (01/17/16 0843)               Barriers/Navigation Needs: Coordination of Care (01/17/16 [id number])   Interventions: Coordination of Care (01/17/16 0843)      Collin, [facility] Oncology Navigator, confirmed that she is coordinating home health referral for Ms Mazure following today's PEG/PAC placements.  Gayleen Orem, RN, BSN, [facility] [phone]                   Time Spent with Patient: 15 (01/17/16 0843)

## 2016-01-19 ENCOUNTER — Other Ambulatory Visit (HOSPITAL_COMMUNITY): Payer: Self-pay | Admitting: Hematology & Oncology

## 2016-01-19 ENCOUNTER — Ambulatory Visit (HOSPITAL_COMMUNITY)
Admission: RE | Admit: 2016-01-19 | Discharge: 2016-01-19 | Disposition: A | Discharge status: Home / Self Care | Payer: Medicaid Other | Source: Ambulatory Visit | Attending: Hematology & Oncology | Admitting: Hematology & Oncology

## 2016-01-19 DIAGNOSIS — I700 Atherosclerosis of aorta: Secondary | ICD-10-CM | POA: Diagnosis not present

## 2016-01-19 DIAGNOSIS — K5730 Diverticulosis of large intestine without perforation or abscess without bleeding: Secondary | ICD-10-CM | POA: Insufficient documentation

## 2016-01-19 DIAGNOSIS — Z931 Gastrostomy status: Secondary | ICD-10-CM | POA: Diagnosis not present

## 2016-01-19 DIAGNOSIS — E041 Nontoxic single thyroid nodule: Secondary | ICD-10-CM | POA: Diagnosis not present

## 2016-01-19 DIAGNOSIS — K8020 Calculus of gallbladder without cholecystitis without obstruction: Secondary | ICD-10-CM | POA: Insufficient documentation

## 2016-01-19 DIAGNOSIS — C760 Malignant neoplasm of head, face and neck: Secondary | ICD-10-CM | POA: Insufficient documentation

## 2016-01-19 LAB — GLUCOSE, CAPILLARY: Glucose-Capillary: 98 mg/dL (ref 65–99)

## 2016-01-19 MED ORDER — FLUDEOXYGLUCOSE F - 18 (FDG) INJECTION
7.6100 | Freq: Once | INTRAVENOUS | Status: AC | PRN
  Administered 2016-01-19: 7.61 via INTRAVENOUS

## 2016-01-20 ENCOUNTER — Other Ambulatory Visit (HOSPITAL_COMMUNITY): Payer: Self-pay | Admitting: Oncology

## 2016-01-20 DIAGNOSIS — C760 Malignant neoplasm of head, face and neck: Secondary | ICD-10-CM

## 2016-01-20 MED ORDER — LIDOCAINE-PRILOCAINE 2.5-2.5 % EX CREA: TOPICAL_CREAM | CUTANEOUS | Status: AC

## 2016-01-20 MED ORDER — PROCHLORPERAZINE MALEATE 10 MG PO TABS: 10.0000 mg | ORAL_TABLET | Freq: Four times a day (QID) | ORAL | Status: DC | PRN

## 2016-01-20 NOTE — Patient Instructions (Addendum)
Gina Frederick   CHEMOTHERAPY INSTRUCTIONS  Premeds:  Aloxi - for nausea/vomiting prevention/reduction. Emend - for nausea/vomiting prevention/reduction. Dexamethasone - steroid - given to also decrease nausea/vomiting. Dex can cause you to feel energized, nervous/anxious/jittery, make you have trouble sleeping, and/or make you feel hot/flushed in the face/neck and/or look pink/red in the face/neck. These side effects will pass as the Dex wears off. (takes 30 minutes to infuse)  Cisplatin - this medication is hard on your kidneys. This is why we give you a large amount of fluids through your IV while you are here getting chemo. We also need you drinking 64 oz of fluid (preferably water/decaff fluids) 2 days prior to chemo and for up to 4-5 days after chemo. Drink more if you can. This will help keep your kidneys flushed. This can also cause acute and/or delayed nausea/vomiting. You must take your nausea meds as prescribed if you get nauseated. Do not wait until you start vomiting. This med can also cause peripheral neuropathy (numbness/tingling/burning in hands/fingers/feet/toes). Let us know if this develops so that we can monitor it and treat if necessary. (Takes 1 hour to infuse)   Neulasta - this medication is not chemo but being given because you have had chemo. It is usually given 27 hours after the completion of chemotherapy. This medication works by boosting your bone marrow's supply of white blood cells. White blood cells are what protect our bodies against infection. The medication is given in the form of a subcutaneous injection. It is given in the fatty tissue of your abdomen. It is a short needle. The major side effect of this medication is bone or muscle pain. The drug of choice to relieve or lessen the pain is Aleve or Ibuprofen. If a physician has ever told you not to take Aleve or Ibuprofen - then don't take it. You should then take Tylenol/acetaminophen.  Take either medication as the bottle directs you to.  The level of pain you experience as a result of this injection can range from none, to mild or moderate, or severe. Please let us know if you develop moderate or severe bone pain.     POTENTIAL SIDE EFFECTS OF TREATMENT: Increased Susceptibility to Infection/Bone Marrow Suppression, Nausea/Vomiting/Constipation/Diarrhea/Hiccups/Acid Reflux/Mouth sores, Hair Thinning/Hair Loss and Changes in Character of Skin and Nails (brittleness, dryness,etc.), Fatigue   EDUCATIONAL MATERIALS GIVEN AND REVIEWED: Chemotherapy and You booklet    SELF CARE ACTIVITIES WHILE ON CHEMOTHERAPY:  Increase your fluid intake 48 hours prior to treatment and drink at least 2 quarts per day after treatment., No alcohol intake., No aspirin or other medications unless approved by your oncologist., Eat foods that are light and easy to digest., Eat foods at cold or room temperature., No fried, fatty, or spicy foods immediately before or after treatment., Have teeth cleaned professionally before starting treatment. Keep dentures and partial plates clean., Use soft toothbrush and do not use mouthwashes that contain alcohol. Biotene is a good mouthwash that is available at most pharmacies or may be ordered by calling [phone]., Use warm salt water gargles (1 teaspoon salt per 1 quart warm water) before and after meals and at bedtime. Or you may rinse with 2 tablespoons of three -percent hydrogen peroxide mixed in eight ounces of water., Always use sunscreen with SPF (Sun Protection Factor) of 30 or higher., Use your nausea medication as directed to prevent nausea., Use your stool softener or laxative as directed to prevent constipation. and Use your anti-diarrheal  medication as directed to stop diarrhea.  Please wash your hands for at least 30 seconds using warm soapy water. Handwashing is the #1 way to prevent the spread of germs. Stay away from sick people or people who are  getting over a cold. If you develop respiratory systems such as green/yellow mucus production or productive cough or persistent cough let us know and we will see if you need an antibiotic. It is a good idea to keep a pair of gloves on when going into grocery stores/[employer] to decrease your risk of coming into contact with germs on the carts, etc. Carry alcohol hand gel with you at all times and use it frequently if out in public. All foods need to be cooked thoroughly. No raw foods. No medium or undercooked meats, eggs. If your food is cooked medium well, it does not need to be hot pink or saturated with bloody liquid at all. Vegetables and fruits need to be washed/rinsed under the faucet with a dish detergent before being consumed. You can eat raw fruits and vegetables unless we tell you otherwise but it would be best if you cooked them or bought frozen. Do not eat off of salad bars or hot bars unless you really trust the cleanliness of the restaurant. If you need dental work, please let Dr. Whitney Muse know before you go for your appointment so that we can coordinate the best possible time for you in regards to your chemo regimen. You need to also let your dentist know that you are actively taking chemo. We may need to do labs prior to your dental appointment. We also want your bowels moving at least every other day. If this is not happening, we need to know so that we can get you on a bowel regimen to help you go.    MEDICATIONS: You have been given prescriptions for the following medications:  Zofran 8mg  tablet. Take 1 tablet every 8 hours as needed for nausea/vomiting. (#1 nausea med to take, this can constipate)  Compazine 10mg  tablet. Take 1 tablet every 6 hours as needed for nausea/vomiting. (#2 nausea med to take, this can make you sleepy)   EMLA cream. Apply a quarter size amount to port site 1 hour prior to chemo. Do not rub in. Cover with plastic wrap.   Over-the-Counter Meds:  Miralax 1  capful in 8 oz of fluid daily. May increase to two times a day if needed. This is a stool softener. If this doesn't work proceed you can add:  Senokot S  - start with 1 tablet two times a day and increase to 4 tablets two times a day if needed. (total of 8 tablets in a 24 hour period). This is a stimulant laxative.   Call us if this does not help your bowels move.   Imodium 2mg  capsule. Take 2 capsules after the 1st loose stool and then 1 capsule every 2 hours until you go a total of 12 hours without having a loose stool. Call the [facility] if loose stools continue. If diarrhea occurs @ bedtime, take 2 capsules @ bedtime. Then take 2 capsules every 4 hours until morning. Call [facility].    SYMPTOMS TO REPORT AS SOON AS POSSIBLE AFTER TREATMENT:  FEVER GREATER THAN 100.5 F  CHILLS WITH OR WITHOUT FEVER  NAUSEA AND VOMITING THAT IS NOT CONTROLLED WITH YOUR NAUSEA MEDICATION  UNUSUAL SHORTNESS OF BREATH  UNUSUAL BRUISING OR BLEEDING  TENDERNESS IN MOUTH AND THROAT WITH OR WITHOUT  PRESENCE OF ULCERS  URINARY PROBLEMS  BOWEL PROBLEMS  UNUSUAL RASH    Wear comfortable clothing and clothing appropriate for easy access to any Portacath or PICC line. Let us know if there is anything that we can do to make your therapy better!      I have been informed and understand all of the instructions given to me and have received a copy. I have been instructed to call the clinic [phone] or my family physician as soon as possible for continued medical care, if indicated. I do not have any more questions at this time but understand that I may call the [facility] or the Patient Navigator at [phone] during office hours should I have questions or need assistance in obtaining follow-up care.          Cisplatin injection What is this medicine? CISPLATIN (SIS pla tin) is a chemotherapy drug. It targets fast dividing cells, like cancer cells, and causes these cells to  die. This medicine is used to treat many types of cancer like bladder, ovarian, and testicular cancers. This medicine may be used for other purposes; ask your health care provider or pharmacist if you have questions. What should I tell my health care provider before I take this medicine? They need to know if you have any of these conditions: -blood disorders -hearing problems -kidney disease -recent or ongoing radiation therapy -an unusual or allergic reaction to cisplatin, carboplatin, other chemotherapy, other medicines, foods, dyes, or preservatives -pregnant or trying to get pregnant -breast-feeding How should I use this medicine? This drug is given as an infusion into a vein. It is administered in a hospital or clinic by a specially trained health care professional. Talk to your pediatrician regarding the use of this medicine in children. Special care may be needed. Overdosage: If you think you have taken too much of this medicine contact a poison control center or emergency room at once. NOTE: This medicine is only for you. Do not share this medicine with others. What if I miss a dose? It is important not to miss a dose. Call your doctor or health care professional if you are unable to keep an appointment. What may interact with this medicine? -dofetilide -foscarnet -medicines for seizures -medicines to increase blood counts like filgrastim, pegfilgrastim, sargramostim -probenecid -pyridoxine used with altretamine -rituximab -some antibiotics like amikacin, gentamicin, neomycin, polymyxin B, streptomycin, tobramycin -sulfinpyrazone -vaccines -zalcitabine Talk to your doctor or health care professional before taking any of these medicines: -acetaminophen -aspirin -ibuprofen -ketoprofen -naproxen This list may not describe all possible interactions. Give your health care provider a list of all the medicines, herbs, non-prescription drugs, or dietary supplements you use. Also  tell them if you smoke, drink alcohol, or use illegal drugs. Some items may interact with your medicine. What should I watch for while using this medicine? Your condition will be monitored carefully while you are receiving this medicine. You will need important blood work done while you are taking this medicine. This drug may make you feel generally unwell. This is not uncommon, as chemotherapy can affect healthy cells as well as cancer cells. Report any side effects. Continue your course of treatment even though you feel ill unless your doctor tells you to stop. In some cases, you may be given additional medicines to help with side effects. Follow all directions for their use. Call your doctor or health care professional for advice if you get a fever, chills or sore throat, or other  symptoms of a cold or flu. Do not treat yourself. This drug decreases your body's ability to fight infections. Try to avoid being around people who are sick. This medicine may increase your risk to bruise or bleed. Call your doctor or health care professional if you notice any unusual bleeding. Be careful brushing and flossing your teeth or using a toothpick because you may get an infection or bleed more easily. If you have any dental work done, tell your dentist you are receiving this medicine. Avoid taking products that contain aspirin, acetaminophen, ibuprofen, naproxen, or ketoprofen unless instructed by your doctor. These medicines may hide a fever. Do not become pregnant while taking this medicine. Women should inform their doctor if they wish to become pregnant or think they might be pregnant. There is a potential for serious side effects to an unborn child. Talk to your health care professional or pharmacist for more information. Do not breast-feed an infant while taking this medicine. Drink fluids as directed while you are taking this medicine. This will help protect your kidneys. Call your doctor or health care  professional if you get diarrhea. Do not treat yourself. What side effects may I notice from receiving this medicine? Side effects that you should report to your doctor or health care professional as soon as possible: -allergic reactions like skin rash, itching or hives, swelling of the face, lips, or tongue -signs of infection - fever or chills, cough, sore throat, pain or difficulty passing urine -signs of decreased platelets or bleeding - bruising, pinpoint red spots on the skin, black, tarry stools, nosebleeds -signs of decreased red blood cells - unusually weak or tired, fainting spells, lightheadedness -breathing problems -changes in hearing -gout pain -low blood counts - This drug may decrease the number of white blood cells, red blood cells and platelets. You may be at increased risk for infections and bleeding. -nausea and vomiting -pain, swelling, redness or irritation at the injection site -pain, tingling, numbness in the hands or feet -problems with balance, movement -trouble passing urine or change in the amount of urine Side effects that usually do not require medical attention (report to your doctor or health care professional if they continue or are bothersome): -changes in vision -loss of appetite -metallic taste in the mouth or changes in taste This list may not describe all possible side effects. Call your doctor for medical advice about side effects. You may report side effects to FDA at 1-800-FDA-1088. Where should I keep my medicine? This drug is given in a hospital or clinic and will not be stored at home. NOTE: This sheet is a summary. It may not cover all possible information. If you have questions about this medicine, talk to your doctor, pharmacist, or health care provider.    2016, Elsevier/[employer] Standard. (2008-01-28 14:40:54) Palonosetron Injection What is this medicine? PALONOSETRON (pal oh NOE se tron) is used to prevent nausea and vomiting caused by  chemotherapy. It also helps prevent delayed nausea and vomiting that may occur a few days after your treatment. This medicine may be used for other purposes; ask your health care provider or pharmacist if you have questions. What should I tell my health care provider before I take this medicine? They need to know if you have any of these conditions: -an unusual or allergic reaction to palonosetron, dolasetron, granisetron, ondansetron, other medicines, foods, dyes, or preservatives -pregnant or trying to get pregnant -breast-feeding How should I use this medicine? This medicine is for infusion into a  vein. It is given by a health care professional in a hospital or clinic setting. Talk to your pediatrician regarding the use of this medicine in children. While this drug may be prescribed for children as young as 1 month for selected conditions, precautions do apply. Overdosage: If you think you have taken too much of this medicine contact a poison control center or emergency room at once. NOTE: This medicine is only for you. Do not share this medicine with others. What if I miss a dose? This does not apply. What may interact with this medicine? -certain medicines for depression, anxiety, or psychotic disturbances -fentanyl -linezolid -MAOIs like Carbex, Eldepryl, Marplan, Nardil, and Parnate -methylene blue (injected into a vein) -tramadol This list may not describe all possible interactions. Give your health care provider a list of all the medicines, herbs, non-prescription drugs, or dietary supplements you use. Also tell them if you smoke, drink alcohol, or use illegal drugs. Some items may interact with your medicine. What should I watch for while using this medicine? Your condition will be monitored carefully while you are receiving this medicine. What side effects may I notice from receiving this medicine? Side effects that you should report to your doctor or health care professional as  soon as possible: -allergic reactions like skin rash, itching or hives, swelling of the face, lips, or tongue -breathing problems -confusion -dizziness -fast, irregular heartbeat -fever and chills -loss of balance or coordination -seizures -sweating -swelling of the hands and feet -tremors -unusually weak or tired Side effects that usually do not require medical attention (report to your doctor or health care professional if they continue or are bothersome): -constipation or diarrhea -headache This list may not describe all possible side effects. Call your doctor for medical advice about side effects. You may report side effects to FDA at 1-800-FDA-1088. Where should I keep my medicine? This drug is given in a hospital or clinic and will not be stored at home. NOTE: This sheet is a summary. It may not cover all possible information. If you have questions about this medicine, talk to your doctor, pharmacist, or health care provider.    2016, Elsevier/Gold Standard. (2013-08-29 10:38:36) Fosaprepitant injection What is this medicine? FOSAPREPITANT (fos ap RE pi tant) is used together with other medicines to prevent nausea and vomiting caused by cancer treatment (chemotherapy). This medicine may be used for other purposes; ask your health care provider or pharmacist if you have questions. What should I tell my health care provider before I take this medicine? They need to know if you have any of these conditions: -liver disease -an unusual or allergic reaction to fosaprepitant, aprepitant, medicines, foods, dyes, or preservatives -pregnant or trying to get pregnant -breast-feeding How should I use this medicine? This medicine is for injection into a vein. It is given by a health care professional in a hospital or clinic setting. Talk to your pediatrician regarding the use of this medicine in children. Special care may be needed. Overdosage: If you think you have taken too much of  this medicine contact a poison control center or emergency room at once. NOTE: This medicine is only for you. Do not share this medicine with others. What if I miss a dose? This does not apply. What may interact with this medicine? Do not take this medicine with any of these medicines: -cisapride -flibanserin -lomitapide -pimozide This medicine may also interact with the following medications: -diltiazem -female hormones, like estrogens or progestins and birth control pills -medicines  for fungal infections like ketoconazole and itraconazole -medicines for HIV -medicines for seizures or to control epilepsy like carbamazepine or phenytoin -medicines used for sleep or anxiety disorders like alprazolam, diazepam, or midazolam -nefazodone -paroxetine -ranolazine -rifampin -some chemotherapy medications like etoposide, ifosfamide, vinblastine, vincristine -some antibiotics like clarithromycin, erythromycin, troleandomycin -steroid medicines like dexamethasone or methylprednisolone -tolbutamide -warfarin This list may not describe all possible interactions. Give your health care provider a list of all the medicines, herbs, non-prescription drugs, or dietary supplements you use. Also tell them if you smoke, drink alcohol, or use illegal drugs. Some items may interact with your medicine. What should I watch for while using this medicine? Do not take this medicine if you already have nausea and vomiting. Ask your health care provider what to do if you already have nausea. Birth control pills and other methods of hormonal contraception (for example, IUD or patch) may not work properly while you are taking this medicine. Use an extra method of birth control during treatment and for 1 month after your last dose of fosaprepitant. This medicine should not be used continuously for a long time. Visit your doctor or health care professional for regular check-ups. This medicine may change your liver  function blood test results. What side effects may I notice from receiving this medicine? Side effects that you should report to your doctor or health care professional as soon as possible: -allergic reactions like skin rash, itching or hives, swelling of the face, lips, or tongue -breathing problems -changes in heart rhythm -high or low blood pressure -pain, redness, or irritation at site where injected -rectal bleeding -serious dizziness or disorientation, confusion -sharp or severe stomach pain -sharp pain in your leg Side effects that usually do not require medical attention (report to your doctor or health care professional if they continue or are bothersome): -constipation or diarrhea -hair loss -headache -hiccups -loss of appetite -nausea -upset stomach -tiredness This list may not describe all possible side effects. Call your doctor for medical advice about side effects. You may report side effects to FDA at 1-800-FDA-1088. Where should I keep my medicine? This drug is given in a hospital or clinic and will not be stored at home. NOTE: This sheet is a summary. It may not cover all possible information. If you have questions about this medicine, talk to your doctor, pharmacist, or health care provider.    2016, Elsevier/Gold Standard. (2014-12-09 10:45:34) Dexamethasone injection What is this medicine? DEXAMETHASONE (dex a METH a sone) is a corticosteroid. It is used to treat inflammation of the skin, joints, lungs, and other organs. Common conditions treated include asthma, allergies, and arthritis. It is also used for other conditions, like blood disorders and diseases of the adrenal glands. This medicine may be used for other purposes; ask your health care provider or pharmacist if you have questions. What should I tell my health care provider before I take this medicine? They need to know if you have any of these conditions: -blood clotting problems -Cushing's  syndrome -diabetes -glaucoma -heart problems or disease -high blood pressure -infection like herpes, measles, tuberculosis, or chickenpox -kidney disease -liver disease -mental problems -myasthenia gravis -osteoporosis -previous heart attack -seizures -stomach, ulcer or intestine disease including colitis and diverticulitis -thyroid problem -an unusual or allergic reaction to dexamethasone, corticosteroids, other medicines, lactose, foods, dyes, or preservatives -pregnant or trying to get pregnant -breast-feeding How should I use this medicine? This medicine is for injection into a muscle, joint, lesion, soft tissue, or vein. It  is given by a health care professional in a hospital or clinic setting. Talk to your pediatrician regarding the use of this medicine in children. Special care may be needed. Overdosage: If you think you have taken too much of this medicine contact a poison control center or emergency room at once. NOTE: This medicine is only for you. Do not share this medicine with others. What if I miss a dose? This may not apply. If you are having a series of injections over a prolonged period, try not to miss an appointment. Call your doctor or health care professional to reschedule if you are unable to keep an appointment. What may interact with this medicine? Do not take this medicine with any of the following medications: -mifepristone, RU-486 -vaccines This medicine may also interact with the following medications: -amphotericin B -antibiotics like clarithromycin, erythromycin, and troleandomycin -aspirin and aspirin-like drugs -barbiturates like phenobarbital -carbamazepine -cholestyramine -cholinesterase inhibitors like donepezil, galantamine, rivastigmine, and tacrine -cyclosporine -digoxin -diuretics -ephedrine -female hormones, like estrogens or progestins and birth control pills -indinavir -isoniazid -ketoconazole -medicines for diabetes -medicines  that improve muscle tone or strength for conditions like myasthenia gravis -NSAIDs, medicines for pain and inflammation, like ibuprofen or naproxen -phenytoin -rifampin -thalidomide -warfarin This list may not describe all possible interactions. Give your health care provider a list of all the medicines, herbs, non-prescription drugs, or dietary supplements you use. Also tell them if you smoke, drink alcohol, or use illegal drugs. Some items may interact with your medicine. What should I watch for while using this medicine? Your condition will be monitored carefully while you are receiving this medicine. If you are taking this medicine for a long time, carry an identification card with your name and address, the type and dose of your medicine, and your doctor's name and address. This medicine may increase your risk of getting an infection. Stay away from people who are sick. Tell your doctor or health care professional if you are around anyone with measles or chickenpox. Talk to your health care provider before you get any vaccines that you take this medicine. If you are going to have surgery, tell your doctor or health care professional that you have taken this medicine within the last twelve months. Ask your doctor or health care professional about your diet. You may need to lower the amount of salt you eat. The medicine can increase your blood sugar. If you are a diabetic check with your doctor if you need help adjusting the dose of your diabetic medicine. What side effects may I notice from receiving this medicine? Side effects that you should report to your doctor or health care professional as soon as possible: -allergic reactions like skin rash, itching or hives, swelling of the face, lips, or tongue -black or tarry stools -change in the amount of urine -changes in vision -confusion, excitement, restlessness, a false sense of well-being -fever, sore throat, sneezing, cough, or other signs  of infection, wounds that will not heal -hallucinations -increased thirst -mental depression, mood swings, mistaken feelings of self importance or of being mistreated -pain in hips, back, ribs, arms, shoulders, or legs -pain, redness, or irritation at the injection site -redness, blistering, peeling or loosening of the skin, including inside the mouth -rounding out of face -swelling of feet or lower legs -unusual bleeding or bruising -unusual tired or weak -wounds that do not heal Side effects that usually do not require medical attention (report to your doctor or health care professional if they continue  or are bothersome): -diarrhea or constipation -change in taste -headache -nausea, vomiting -skin problems, acne, thin and shiny skin -touble sleeping -unusual growth of hair on the face or body -weight gain This list may not describe all possible side effects. Call your doctor for medical advice about side effects. You may report side effects to FDA at 1-800-FDA-1088. Where should I keep my medicine? This drug is given in a hospital or clinic and will not be stored at home. NOTE: This sheet is a summary. It may not cover all possible information. If you have questions about this medicine, talk to your doctor, pharmacist, or health care provider.    2016, [employer]. (2008-02-13 14:04:12) Pegfilgrastim injection What is this medicine? PEGFILGRASTIM (PEG fil gra stim) is a long-acting granulocyte colony-stimulating factor that stimulates the growth of neutrophils, a type of white blood cell important in the body's fight against infection. It is used to reduce the incidence of fever and infection in patients with certain types of cancer who are receiving chemotherapy that affects the bone marrow, and to increase survival after being exposed to high doses of radiation. This medicine may be used for other purposes; ask your health care provider or pharmacist if you have  questions. What should I tell my health care provider before I take this medicine? They need to know if you have any of these conditions: -kidney disease -latex allergy -ongoing radiation therapy -sickle cell disease -skin reactions to acrylic adhesives (On-Body Injector only) -an unusual or allergic reaction to pegfilgrastim, filgrastim, other medicines, foods, dyes, or preservatives -pregnant or trying to get pregnant -breast-feeding How should I use this medicine? This medicine is for injection under the skin. If you get this medicine at home, you will be taught how to prepare and give the pre-filled syringe or how to use the On-body Injector. Refer to the patient Instructions for Use for detailed instructions. Use exactly as directed. Take your medicine at regular intervals. Do not take your medicine more often than directed. It is important that you put your used needles and syringes in a special sharps container. Do not put them in a trash can. If you do not have a sharps container, call your pharmacist or healthcare provider to get one. Talk to your pediatrician regarding the use of this medicine in children. While this drug may be prescribed for selected conditions, precautions do apply. Overdosage: If you think you have taken too much of this medicine contact a poison control center or emergency room at once. NOTE: This medicine is only for you. Do not share this medicine with others. What if I miss a dose? It is important not to miss your dose. Call your doctor or health care professional if you miss your dose. If you miss a dose due to an On-body Injector failure or leakage, a new dose should be administered as soon as possible using a single prefilled syringe for manual use. What may interact with this medicine? Interactions have not been studied. Give your health care provider a list of all the medicines, herbs, non-prescription drugs, or dietary supplements you use. Also tell them  if you smoke, drink alcohol, or use illegal drugs. Some items may interact with your medicine. This list may not describe all possible interactions. Give your health care provider a list of all the medicines, herbs, non-prescription drugs, or dietary supplements you use. Also tell them if you smoke, drink alcohol, or use illegal drugs. Some items may interact with your medicine. What  should I watch for while using this medicine? You may need blood work done while you are taking this medicine. If you are going to need a MRI, CT scan, or other procedure, tell your doctor that you are using this medicine (On-Body Injector only). What side effects may I notice from receiving this medicine? Side effects that you should report to your doctor or health care professional as soon as possible: -allergic reactions like skin rash, itching or hives, swelling of the face, lips, or tongue -dizziness -fever -pain, redness, or irritation at site where injected -pinpoint red spots on the skin -red or dark-brown urine -shortness of breath or breathing problems -stomach or side pain, or pain at the shoulder -swelling -tiredness -trouble passing urine or change in the amount of urine Side effects that usually do not require medical attention (report to your doctor or health care professional if they continue or are bothersome): -bone pain -muscle pain This list may not describe all possible side effects. Call your doctor for medical advice about side effects. You may report side effects to FDA at 1-800-FDA-1088. Where should I keep my medicine? Keep out of the reach of children. Store pre-filled syringes in a refrigerator between 2 and 8 degrees C (36 and 46 degrees F). Do not freeze. Keep in carton to protect from light. Throw away this medicine if it is left out of the refrigerator for more than 48 hours. Throw away any unused medicine after the expiration date. NOTE: This sheet is a summary. It may not cover  all possible information. If you have questions about this medicine, talk to your doctor, pharmacist, or health care provider.    2016, [employer] Standard. (2014-11-12 14:30:14) Lidocaine; Prilocaine cream What is this medicine? LIDOCAINE; PRILOCAINE (LYE doe kane; PRIL oh kane) is a topical anesthetic that causes loss of feeling in the skin and surrounding tissues. It is used to numb the skin before procedures or injections. This medicine may be used for other purposes; ask your health care provider or pharmacist if you have questions. What should I tell my health care provider before I take this medicine? They need to know if you have any of these conditions: -glucose-6-phosphate deficiencies -heart disease -kidney or liver disease -methemoglobinemia -an unusual or allergic reaction to lidocaine, prilocaine, other medicines, foods, dyes, or preservatives -pregnant or trying to get pregnant -breast-feeding How should I use this medicine? This medicine is for external use only on the skin. Do not take by mouth. Follow the directions on the prescription label. Wash hands before and after use. Do not use more or leave in contact with the skin longer than directed. Do not apply to eyes or open wounds. It can cause irritation and blurred or temporary loss of vision. If this medicine comes in contact with your eyes, immediately rinse the eye with water. Do not touch or rub the eye. Contact your health care provider right away. Talk to your pediatrician regarding the use of this medicine in children. While this medicine may be prescribed for children for selected conditions, precautions do apply. Overdosage: If you think you have taken too much of this medicine contact a poison control center or emergency room at once. NOTE: This medicine is only for you. Do not share this medicine with others. What if I miss a dose? This medicine is usually only applied once prior to each procedure. It must be  in contact with the skin for a period of time for it to work. If  you applied this medicine later than directed, tell your health care professional before starting the procedure. What may interact with this medicine? -acetaminophen -chloroquine -dapsone -medicines to control heart rhythm -nitrates like nitroglycerin and nitroprusside -other ointments, creams, or sprays that may contain anesthetic medicine -phenobarbital -phenytoin -quinine -sulfonamides like sulfacetamide, sulfamethoxazole, sulfasalazine and others This list may not describe all possible interactions. Give your health care provider a list of all the medicines, herbs, non-prescription drugs, or dietary supplements you use. Also tell them if you smoke, drink alcohol, or use illegal drugs. Some items may interact with your medicine. What should I watch for while using this medicine? Be careful to avoid injury to the treated area while it is numb and you are not aware of pain. Avoid scratching, rubbing, or exposing the treated area to hot or cold temperatures until complete sensation has returned. The numb feeling will wear off a few hours after applying the cream. What side effects may I notice from receiving this medicine? Side effects that you should report to your doctor or health care professional as soon as possible: -blurred vision -chest pain -difficulty breathing -dizziness -drowsiness -fast or irregular heartbeat -skin rash or itching -swelling of your throat, lips, or face -trembling Side effects that usually do not require medical attention (report to your doctor or health care professional if they continue or are bothersome): -changes in ability to feel hot or cold -redness and swelling at the application site This list may not describe all possible side effects. Call your doctor for medical advice about side effects. You may report side effects to FDA at 1-800-FDA-1088. Where should I keep my medicine? Keep  out of reach of children. Store at room temperature between 15 and 30 degrees C (59 and 86 degrees F). Keep container tightly closed. Throw away any unused medicine after the expiration date. NOTE: This sheet is a summary. It may not cover all possible information. If you have questions about this medicine, talk to your doctor, pharmacist, or health care provider.    2016, [employer] Standard. (2008-04-27 17:14:35)  Ondansetron tablets What is this medicine? ONDANSETRON (on DAN se tron) is used to treat nausea and vomiting caused by chemotherapy. It is also used to prevent or treat nausea and vomiting after surgery. This medicine may be used for other purposes; ask your health care provider or pharmacist if you have questions. What should I tell my health care provider before I take this medicine? They need to know if you have any of these conditions: -heart disease -history of irregular heartbeat -liver disease -low levels of magnesium or potassium in the blood -an unusual or allergic reaction to ondansetron, granisetron, other medicines, foods, dyes, or preservatives -pregnant or trying to get pregnant -breast-feeding How should I use this medicine? Take this medicine by mouth with a glass of water. Follow the directions on your prescription label. Take your doses at regular intervals. Do not take your medicine more often than directed. Talk to your pediatrician regarding the use of this medicine in children. Special care may be needed. Overdosage: If you think you have taken too much of this medicine contact a poison control center or emergency room at once. NOTE: This medicine is only for you. Do not share this medicine with others. What if I miss a dose? If you miss a dose, take it as soon as you can. If it is almost time for your next dose, take only that dose. Do not take double or extra doses.  What may interact with this medicine? Do not take this medicine with any of the  following medications: -apomorphine -certain medicines for fungal infections like fluconazole, itraconazole, ketoconazole, posaconazole, voriconazole -cisapride -dofetilide -dronedarone -pimozide -thioridazine -ziprasidone This medicine may also interact with the following medications: -carbamazepine -certain medicines for depression, anxiety, or psychotic disturbances -fentanyl -linezolid -MAOIs like Carbex, Eldepryl, Marplan, Nardil, and Parnate -methylene blue (injected into a vein) -other medicines that prolong the QT interval (cause an abnormal heart rhythm) -phenytoin -rifampicin -tramadol This list may not describe all possible interactions. Give your health care provider a list of all the medicines, herbs, non-prescription drugs, or dietary supplements you use. Also tell them if you smoke, drink alcohol, or use illegal drugs. Some items may interact with your medicine. What should I watch for while using this medicine? Check with your doctor or health care professional right away if you have any sign of an allergic reaction. What side effects may I notice from receiving this medicine? Side effects that you should report to your doctor or health care professional as soon as possible: -allergic reactions like skin rash, itching or hives, swelling of the face, lips or tongue -breathing problems -confusion -dizziness -fast or irregular heartbeat -feeling faint or lightheaded, falls -fever and chills -loss of balance or coordination -seizures -sweating -swelling of the hands or feet -tightness in the chest -tremors -unusually weak or tired Side effects that usually do not require medical attention (report to your doctor or health care professional if they continue or are bothersome): -constipation or diarrhea -headache This list may not describe all possible side effects. Call your doctor for medical advice about side effects. You may report side effects to FDA at  1-800-FDA-1088. Where should I keep my medicine? Keep out of the reach of children. Store between 2 and 30 degrees C (36 and 86 degrees F). Throw away any unused medicine after the expiration date. NOTE: This sheet is a summary. It may not cover all possible information. If you have questions about this medicine, talk to your doctor, pharmacist, or health care provider.    2016, [employer] Standard. (2013-07-30 16:27:45) Prochlorperazine tablets What is this medicine? PROCHLORPERAZINE (proe klor PER a zeen) helps to control severe nausea and vomiting. This medicine is also used to treat schizophrenia. It can also help patients who experience anxiety that is not due to psychological illness. This medicine may be used for other purposes; ask your health care provider or pharmacist if you have questions. What should I tell my health care provider before I take this medicine? They need to know if you have any of these conditions: -blood disorders or disease -dementia -liver disease or jaundice -Parkinson's disease -uncontrollable movement disorder -an unusual or allergic reaction to prochlorperazine, other medicines, foods, dyes, or preservatives -pregnant or trying to get pregnant -breast-feeding How should I use this medicine? Take this medicine by mouth with a glass of water. Follow the directions on the prescription label. Take your doses at regular intervals. Do not take your medicine more often than directed. Do not stop taking this medicine suddenly. This can cause nausea, vomiting, and dizziness. Ask your doctor or health care professional for advice. Talk to your pediatrician regarding the use of this medicine in children. Special care may be needed. While this drug may be prescribed for children as young as 2 years for selected conditions, precautions do apply. Overdosage: If you think you have taken too much of this medicine contact a poison control center or emergency  room at  once. NOTE: This medicine is only for you. Do not share this medicine with others. What if I miss a dose? If you miss a dose, take it as soon as you can. If it is almost time for your next dose, take only that dose. Do not take double or extra doses. What may interact with this medicine? Do not take this medicine with any of the following medications: -amoxapine -antidepressants like citalopram, escitalopram, fluoxetine, paroxetine, and sertraline -deferoxamine -dofetilide -maprotiline -tricyclic antidepressants like amitriptyline, clomipramine, imipramine, nortiptyline and others This medicine may also interact with the following medications: -lithium -medicines for pain -phenytoin -propranolol -warfarin This list may not describe all possible interactions. Give your health care provider a list of all the medicines, herbs, non-prescription drugs, or dietary supplements you use. Also tell them if you smoke, drink alcohol, or use illegal drugs. Some items may interact with your medicine. What should I watch for while using this medicine? Visit your doctor or health care professional for regular checks on your progress. You may get drowsy or dizzy. Do not drive, use machinery, or do anything that needs mental alertness until you know how this medicine affects you. Do not stand or sit up quickly, especially if you are an older patient. This reduces the risk of dizzy or fainting spells. Alcohol may interfere with the effect of this medicine. Avoid alcoholic drinks. This medicine can reduce the response of your body to heat or cold. Dress warm in cold weather and stay hydrated in hot weather. If possible, avoid extreme temperatures like saunas, hot tubs, very hot or cold showers, or activities that can cause dehydration such as vigorous exercise. This medicine can make you more sensitive to the sun. Keep out of the sun. If you cannot avoid being in the sun, wear protective clothing and use  sunscreen. Do not use sun lamps or tanning beds/booths. Your mouth may get dry. Chewing sugarless gum or sucking hard candy, and drinking plenty of water may help. Contact your doctor if the problem does not go away or is severe. What side effects may I notice from receiving this medicine? Side effects that you should report to your doctor or health care professional as soon as possible: -blurred vision -breast enlargement in men or women -breast milk in women who are not breast-feeding -chest pain, fast or irregular heartbeat -confusion, restlessness -dark yellow or brown urine -difficulty breathing or swallowing -dizziness or fainting spells -drooling, shaking, movement difficulty (shuffling walk) or rigidity -fever, chills, sore throat -involuntary or uncontrollable movements of the eyes, mouth, head, arms, and legs -seizures -stomach area pain -unusually weak or tired -unusual bleeding or bruising -yellowing of skin or eyes Side effects that usually do not require medical attention (report to your doctor or health care professional if they continue or are bothersome): -difficulty passing urine -difficulty sleeping -headache -sexual dysfunction -skin rash, or itching This list may not describe all possible side effects. Call your doctor for medical advice about side effects. You may report side effects to FDA at 1-800-FDA-1088. Where should I keep my medicine? Keep out of the reach of children. Store at room temperature between 15 and 30 degrees C (59 and 86 degrees F). Protect from light. Throw away any unused medicine after the expiration date. NOTE: This sheet is a summary. It may not cover all possible information. If you have questions about this medicine, talk to your doctor, pharmacist, or health care provider.    2016, [employer] Standard. (2012-03-12  16:59:39) Prochlorperazine tablets What is this medicine? PROCHLORPERAZINE (proe klor PER a zeen) helps to control  severe nausea and vomiting. This medicine is also used to treat schizophrenia. It can also help patients who experience anxiety that is not due to psychological illness. This medicine may be used for other purposes; ask your health care provider or pharmacist if you have questions. What should I tell my health care provider before I take this medicine? They need to know if you have any of these conditions: -blood disorders or disease -dementia -liver disease or jaundice -Parkinson's disease -uncontrollable movement disorder -an unusual or allergic reaction to prochlorperazine, other medicines, foods, dyes, or preservatives -pregnant or trying to get pregnant -breast-feeding How should I use this medicine? Take this medicine by mouth with a glass of water. Follow the directions on the prescription label. Take your doses at regular intervals. Do not take your medicine more often than directed. Do not stop taking this medicine suddenly. This can cause nausea, vomiting, and dizziness. Ask your doctor or health care professional for advice. Talk to your pediatrician regarding the use of this medicine in children. Special care may be needed. While this drug may be prescribed for children as young as 2 years for selected conditions, precautions do apply. Overdosage: If you think you have taken too much of this medicine contact a poison control center or emergency room at once. NOTE: This medicine is only for you. Do not share this medicine with others. What if I miss a dose? If you miss a dose, take it as soon as you can. If it is almost time for your next dose, take only that dose. Do not take double or extra doses. What may interact with this medicine? Do not take this medicine with any of the following medications: -amoxapine -antidepressants like citalopram, escitalopram, fluoxetine, paroxetine, and sertraline -deferoxamine -dofetilide -maprotiline -tricyclic antidepressants like  amitriptyline, clomipramine, imipramine, nortiptyline and others This medicine may also interact with the following medications: -lithium -medicines for pain -phenytoin -propranolol -warfarin This list may not describe all possible interactions. Give your health care provider a list of all the medicines, herbs, non-prescription drugs, or dietary supplements you use. Also tell them if you smoke, drink alcohol, or use illegal drugs. Some items may interact with your medicine. What should I watch for while using this medicine? Visit your doctor or health care professional for regular checks on your progress. You may get drowsy or dizzy. Do not drive, use machinery, or do anything that needs mental alertness until you know how this medicine affects you. Do not stand or sit up quickly, especially if you are an older patient. This reduces the risk of dizzy or fainting spells. Alcohol may interfere with the effect of this medicine. Avoid alcoholic drinks. This medicine can reduce the response of your body to heat or cold. Dress warm in cold weather and stay hydrated in hot weather. If possible, avoid extreme temperatures like saunas, hot tubs, very hot or cold showers, or activities that can cause dehydration such as vigorous exercise. This medicine can make you more sensitive to the sun. Keep out of the sun. If you cannot avoid being in the sun, wear protective clothing and use sunscreen. Do not use sun lamps or tanning beds/booths. Your mouth may get dry. Chewing sugarless gum or sucking hard candy, and drinking plenty of water may help. Contact your doctor if the problem does not go away or is severe. What side effects may I notice from  receiving this medicine? Side effects that you should report to your doctor or health care professional as soon as possible: -blurred vision -breast enlargement in men or women -breast milk in women who are not breast-feeding -chest pain, fast or irregular  heartbeat -confusion, restlessness -dark yellow or brown urine -difficulty breathing or swallowing -dizziness or fainting spells -drooling, shaking, movement difficulty (shuffling walk) or rigidity -fever, chills, sore throat -involuntary or uncontrollable movements of the eyes, mouth, head, arms, and legs -seizures -stomach area pain -unusually weak or tired -unusual bleeding or bruising -yellowing of skin or eyes Side effects that usually do not require medical attention (report to your doctor or health care professional if they continue or are bothersome): -difficulty passing urine -difficulty sleeping -headache -sexual dysfunction -skin rash, or itching This list may not describe all possible side effects. Call your doctor for medical advice about side effects. You may report side effects to FDA at 1-800-FDA-1088. Where should I keep my medicine? Keep out of the reach of children. Store at room temperature between 15 and 30 degrees C (59 and 86 degrees F). Protect from light. Throw away any unused medicine after the expiration date. NOTE: This sheet is a summary. It may not cover all possible information. If you have questions about this medicine, talk to your doctor, pharmacist, or health care provider.    2016, [employer]vier/Gold Standard. (2012-03-12 16:59:39)

## 2016-01-21 ENCOUNTER — Encounter (HOSPITAL_COMMUNITY): Payer: Medicaid Other

## 2016-01-21 ENCOUNTER — Other Ambulatory Visit (HOSPITAL_COMMUNITY): Payer: Self-pay | Admitting: Oncology

## 2016-01-21 ENCOUNTER — Encounter (HOSPITAL_BASED_OUTPATIENT_CLINIC_OR_DEPARTMENT_OTHER): Payer: Medicaid Other | Admitting: Hematology & Oncology

## 2016-01-21 VITALS — BP 116/82 | HR 96 | Temp 98.9°F | Resp 18 | Wt 150.0 lb

## 2016-01-21 DIAGNOSIS — Z9889 Other specified postprocedural states: Secondary | ICD-10-CM | POA: Diagnosis not present

## 2016-01-21 DIAGNOSIS — C539 Malignant neoplasm of cervix uteri, unspecified: Secondary | ICD-10-CM | POA: Diagnosis not present

## 2016-01-21 DIAGNOSIS — Z720 Tobacco use: Secondary | ICD-10-CM | POA: Diagnosis not present

## 2016-01-21 DIAGNOSIS — J432 Centrilobular emphysema: Secondary | ICD-10-CM | POA: Diagnosis not present

## 2016-01-21 DIAGNOSIS — I10 Essential (primary) hypertension: Secondary | ICD-10-CM

## 2016-01-21 DIAGNOSIS — R221 Localized swelling, mass and lump, neck: Secondary | ICD-10-CM | POA: Diagnosis not present

## 2016-01-21 DIAGNOSIS — Z79899 Other long term (current) drug therapy: Secondary | ICD-10-CM | POA: Diagnosis not present

## 2016-01-21 DIAGNOSIS — M1990 Unspecified osteoarthritis, unspecified site: Secondary | ICD-10-CM | POA: Diagnosis not present

## 2016-01-21 DIAGNOSIS — C760 Malignant neoplasm of head, face and neck: Secondary | ICD-10-CM | POA: Diagnosis not present

## 2016-01-21 DIAGNOSIS — Z95828 Presence of other vascular implants and grafts: Secondary | ICD-10-CM

## 2016-01-21 DIAGNOSIS — F17210 Nicotine dependence, cigarettes, uncomplicated: Secondary | ICD-10-CM | POA: Diagnosis not present

## 2016-01-21 DIAGNOSIS — D649 Anemia, unspecified: Secondary | ICD-10-CM

## 2016-01-21 DIAGNOSIS — E785 Hyperlipidemia, unspecified: Secondary | ICD-10-CM | POA: Diagnosis not present

## 2016-01-21 DIAGNOSIS — Z8489 Family history of other specified conditions: Secondary | ICD-10-CM | POA: Diagnosis not present

## 2016-01-21 DIAGNOSIS — M542 Cervicalgia: Secondary | ICD-10-CM | POA: Diagnosis not present

## 2016-01-21 LAB — COMPREHENSIVE METABOLIC PANEL
ALT: 13 U/L — AB (ref 14–54)
AST: 20 U/L (ref 15–41)
Albumin: 3.7 g/dL (ref 3.5–5.0)
Alkaline Phosphatase: 74 U/L (ref 38–126)
Anion gap: 10 (ref 5–15)
BUN: 19 mg/dL (ref 6–20)
CHLORIDE: 91 mmol/L — AB (ref 101–111)
CO2: 30 mmol/L (ref 22–32)
CREATININE: 0.83 mg/dL (ref 0.44–1.00)
Calcium: 9.2 mg/dL (ref 8.9–10.3)
GFR calc Af Amer: >60 mL/min (ref >60)
GFR calc non Af Amer: >60 mL/min (ref >60)
Glucose, Bld: 124 mg/dL — ABNORMAL HIGH (ref 65–99)
Potassium: 4.1 mmol/L (ref 3.5–5.1)
SODIUM: 131 mmol/L — AB (ref 135–145)
Total Bilirubin: 0.6 mg/dL (ref 0.3–1.2)
Total Protein: 7.6 g/dL (ref 6.5–8.1)

## 2016-01-21 LAB — CBC WITH DIFFERENTIAL/PLATELET
BASOS ABS: 0 10*3/uL (ref 0.0–0.1)
Basophils Relative: 0 %
EOS ABS: 0.2 10*3/uL (ref 0.0–0.7)
EOS PCT: 1 %
HCT: 33.9 % — ABNORMAL LOW (ref 36.0–46.0)
HEMOGLOBIN: 10.9 g/dL — AB (ref 12.0–15.0)
LYMPHS PCT: 11 %
Lymphs Abs: 1.3 10*3/uL (ref 0.7–4.0)
MCH: 30.4 pg (ref 26.0–34.0)
MCHC: 32.2 g/dL (ref 30.0–36.0)
MCV: 94.4 fL (ref 78.0–100.0)
Monocytes Absolute: 1 10*3/uL (ref 0.1–1.0)
Monocytes Relative: 8 %
NEUTROS PCT: 80 %
Neutro Abs: 9.6 10*3/uL — ABNORMAL HIGH (ref 1.7–7.7)
PLATELETS: 446 10*3/uL — AB (ref 150–400)
RBC: 3.59 MIL/uL — AB (ref 3.87–5.11)
RDW: 13.9 % (ref 11.5–15.5)
WBC: 12.1 10*3/uL — AB (ref 4.0–10.5)

## 2016-01-21 LAB — T4, FREE: FREE T4: 1.07 ng/dL (ref 0.61–1.12)

## 2016-01-21 LAB — MAGNESIUM: Magnesium: 1.9 mg/dL (ref 1.7–2.4)

## 2016-01-21 LAB — TSH: TSH: 4.179 u[IU]/mL (ref 0.350–4.500)

## 2016-01-21 NOTE — Patient Instructions (Signed)
[place] at [facility] Discharge Instructions  RECOMMENDATIONS MADE BY THE CONSULTANT AND ANY TEST RESULTS WILL BE SENT TO YOUR REFERRING PHYSICIAN.   Exam and discussion by Dr Whitney Muse today Lab work today Speech therapy referral  We are here to help you.  If you start to get weak, and start vomiting please call us ans we can get you in to have fluids.  It will make you feel better.  So please call us we are here to help you. Return to see the doctor 01/31/2016 You will start chemotherapy that day. Please call the clinic if you have any questions or concerns    Thank you for choosing [place] at [facility] to provide your oncology and hematology care.  To afford each patient quality time with our provider, please arrive at least 15 minutes before your scheduled appointment time.   Beginning January 23rd 2017 lab work for the [name] will be done in the  Main lab at [facility] on 1st floor. If you have a lab appointment with the [facility] please come in thru the  Main Entrance and check in at the main information desk  You need to re-schedule your appointment should you arrive 10 or more minutes late.  We strive to give you quality time with our providers, and arriving late affects you and other patients whose appointments are after yours.  Also, if you no show three or more times for appointments you may be dismissed from the clinic at the providers discretion.     Again, thank you for choosing [facility].  Our hope is that these requests will decrease the amount of time that you wait before being seen by our physicians.       _____________________________________________________________  Should you have questions after your visit to [facility], please contact our office at [phone] between the hours of 8:30 a.m. and 4:30 p.m.  Voicemails left after 4:30 p.m. will not be returned until the  following business day.  For prescription refill requests, have your pharmacy contact our office.         Resources For Cancer Patients and their Caregivers ? American Cancer Society: Can assist with transportation, wigs, general needs, runs Look Good Feel Better.        226-619-0009 ? Cancer Care: Provides financial assistance, online support groups, medication/co-pay assistance.  1-800-813-HOPE 209-722-0626) ? [facility] Assists [place] cancer patients and their families through emotional , educational and financial support.  906-315-9599 ? [place] DSS Where to apply for food stamps, Medicaid and utility assistance. 629-755-5213 ? [facility]: Transportation to medical appointments. 289-589-0441 ? Social Security Administration: May apply for disability if have a Stage IV cancer. [phone] ? [employer], Disability and Transit Services: Assists with nutrition, care and transit needs. 540-045-6785

## 2016-01-21 NOTE — Progress Notes (Signed)
Chemo teaching done and consent signed for Cisplatin. Calendar given to patient. Referral made to ST. Orders for [facility] given to [place] representative. XRT to start 02/01/16. Chemo to start 01/31/16. Distress screening done. PEG tube drsg changed and new drsg applied. Site WNL. Bumper fits appropriately but snug to abdomen. Drsg to port site removed.

## 2016-01-22 ENCOUNTER — Encounter (HOSPITAL_COMMUNITY): Payer: Self-pay | Admitting: Hematology & Oncology

## 2016-01-22 DIAGNOSIS — Z95828 Presence of other vascular implants and grafts: Secondary | ICD-10-CM | POA: Insufficient documentation

## 2016-01-22 DIAGNOSIS — D649 Anemia, unspecified: Secondary | ICD-10-CM | POA: Insufficient documentation

## 2016-01-22 NOTE — Progress Notes (Signed)
[place] at [facility] Progress Note  Patient Care Team: Noel Journey. 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 Benjamine Mola 12/21/2015 No obvious primary lesion noted on panendoscopy examination CT neck 12/17/2015 large infiltrative tumor mass extends to skin surface, infiltrates R patorid, R submandibular, R carotid sheath,Thrombosis of R IJ vein, multiple satellite nodules Centrilobular emphysema P16 negative Tobacco Abuse Port a cath/FT placement on 01/17/2016 Multiple dental extractions with Dr. Enrique Sack 01/13/3016   HISTORY OF PRESENTING ILLNESS:  Gina Frederick 60 y.o. 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   She denies pain. She continues to smoke although she notes she is cutting back. She is anxious to proceed with therapy.   She sees Dr. Enrique Sack next week for suture removal. She is here for chemotherapy teaching.  She notes that she has no questions today. She remarks that she understands treatment will be difficult.    MEDICAL HISTORY:  Past Medical History  Diagnosis Date  . Hypertension   . Arthritis     hands  . Neck mass     right neck(surfaced 11'16,then progressively enlarged"- denies swallwong or breathing difficulty.  . Hyperlipidemia   . Cancer ([place])     dx. squamous cell carcinoma-right neck    SURGICAL HISTORY: Past Surgical History  Procedure Laterality Date  . Tonsillectomy    . Knee arthroscopy      right  . Cesarean section    . Mass biopsy Right 12/21/2015    Procedure: NECK MASS BIOPSY;  Surgeon: Leta Baptist, MD;  Location: [facility];  Service: ENT;  Laterality: Right;  . Panendoscopy N/A 12/21/2015    Procedure: PANENDOSCOPY;  Surgeon: Leta Baptist, MD;  Location: [facility];  Service: ENT;  Laterality: N/A;  . Multiple extractions with alveoloplasty N/A 01/13/2016    Procedure: EXTRACTION OF TOOTH #'S 6-12,15, 19-28, 30 WITH ALVEOLOPLASTY AND BILATERAL MAXILLARY TUBEROSITY REDUCTIONS;  Surgeon: Lenn Cal, DDS;  Location: [facility];  Service: Oral Surgery;  Laterality: N/A;    SOCIAL HISTORY: Social History   Social History  . Marital Status: Divorced    Spouse Name: N/A  . Number of Children: 1  . Years of Education: N/A   Occupational History  . Not on file.   Social History Main Topics  . Smoking status: Current Every Day Smoker -- 0.25 packs/day    Types: Cigarettes  . Smokeless tobacco: Never Used     Comment: cut back to 1 cigarette per day  . Alcohol Use: No  . Drug Use: No  . Sexual Activity: No   Other Topics Concern  . Not on file   Social History Narrative   She is divorced. One daughter, no grandchildren Smokes, notes that she is down to 3 a day. Began smoking "probably in her 67's." She says she has quit "on and off through the years." Denies problems with EtOH. 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 Quit working when her daughter was "of age to go to school," then stayed home with her daughter. For hobbies, she says she likes to  read.  Is currently the caregiver for a lady with diabetes.  She does light housekeeping and keeps her on her medications, and cooks. The son of the woman she cares for, Herbie Baltimore, notes that her good cooking has brought his momma's blood pressure and blood sugar down.  She notes she has a good support system with her sister, and with the son of the woman she takes care of.  FAMILY HISTORY: Family History  Problem Relation Age of Onset  . Alzheimer's disease  Mother   . Congestive Heart Failure Father    indicated that her mother is deceased. She indicated that her father is deceased.   Mother and father are deceased. Mother was 90; died of Alzheimer's. Father was 34; congestive heart failure. Has a sister, age 34 this year. Had a brother, deceased. He took his own life. 4 nieces and nephews.  ALLERGIES:  is allergic to codeine.  MEDICATIONS:  Current Outpatient Prescriptions  Medication Sig Dispense Refill  . acetaminophen (TYLENOL) 500 MG tablet Take 500 mg by mouth every 6 (six) hours as needed.    . diphenhydrAMINE (BENADRYL) 25 MG tablet Take 25 mg by mouth every 6 (six) hours as needed.    Marland Kitchen lisinopril-hydrochlorothiazide (PRINZIDE,ZESTORETIC) 20-12.5 MG tablet Take 1 tablet by mouth daily.    Marland Kitchen lovastatin (MEVACOR) 20 MG tablet Take 20 mg by mouth at bedtime. Reported on 01/21/2016    . naproxen sodium (ANAPROX) 220 MG tablet Take 220 mg by mouth 2 (two) times daily as needed (Pain).    Bethann Humble Sulfate (EYE DROPS ALLERGY RELIEF OP) Apply 1 drop to eye 2 (two) times daily as needed (Itching eyes).    . CISPLATIN IV Inject into the vein. Reported on 01/21/2016    . lidocaine-prilocaine (EMLA) cream Apply a quarter size amount to port site 1 hour prior to chemo. Do not rub in. Cover with plastic wrap. (Patient not taking: Reported on 01/21/2016) 30 g 3  . ondansetron (ZOFRAN) 8 MG tablet Take 1 tablet (8 mg total) by mouth every 8 (eight) hours as needed for nausea or vomiting. (Patient not taking: Reported on 01/21/2016) 30 tablet 2  . oxyCODONE-acetaminophen (PERCOCET) 5-325 MG tablet Take one or two tablets by mouth every 6 hours as needed for pain. (Patient not taking: Reported on 01/21/2016) 40 tablet 0  . prochlorperazine (COMPAZINE) 10 MG tablet Take 1 tablet (10 mg total) by mouth every 6 (six) hours as needed for nausea or vomiting. (Patient not taking: Reported on 01/21/2016) 30 tablet 2   No current  facility-administered medications for this visit.    Review of Systems  Constitutional: Positive for weight loss. Negative for fever, chills and malaise/fatigue.       Lost about 10 pounds since Christmas  HENT: Negative.  Negative for congestion, hearing loss, nosebleeds, sore throat and tinnitus.   Eyes: Negative.  Negative for blurred vision, double vision, pain and discharge.  Respiratory: Positive for cough. Negative for hemoptysis, sputum production, shortness of breath and wheezing.        "Light cough" since starting her blood pressure medication.  Cardiovascular: Negative.  Negative for chest pain, palpitations, claudication, leg swelling and PND.  Gastrointestinal: Negative.  Negative for heartburn, nausea, vomiting, abdominal pain, diarrhea, constipation, blood in stool and melena.  Genitourinary: Negative.  Negative for dysuria, urgency, frequency and hematuria.  Musculoskeletal: Negative.  Negative for myalgias, joint pain and falls.  Skin: Negative.  Negative for itching and rash.  Neurological: Negative.  Negative  for dizziness, tingling, tremors, sensory change, speech change, focal weakness, seizures, loss of consciousness, weakness and headaches.  Endo/Heme/Allergies: Negative.  Does not bruise/bleed easily.  Psychiatric/Behavioral: Negative.  Negative for depression, suicidal ideas, memory loss and substance abuse. The patient is not nervous/anxious and does not have insomnia.   All other systems reviewed and are negative.  14 point ROS was done and is otherwise as detailed above or in HPI   PHYSICAL EXAMINATION: ECOG PERFORMANCE STATUS: 1 - Symptomatic but completely ambulatory  Filed Vitals:   01/21/16 0800  BP: 116/82  Pulse: 96  Temp: 98.9 F (37.2 C)  Resp: 18   Filed Weights   01/21/16 0800  Weight: 150 lb (68.04 kg)    Physical Exam  Constitutional: She is oriented to person, place, and time and well-developed, well-nourished, and in no distress.    Wears glasses  HENT:  Head: Normocephalic and atraumatic.  Nose: Nose normal.  Mouth/Throat: Oropharynx is clear and moist. No oropharyngeal exudate.  Limited as to how far she can open her mouth, due to her mass.  Eyes: Conjunctivae and EOM are normal. Pupils are equal, round, and reactive to light. Right eye exhibits no discharge. Left eye exhibits no discharge. No scleral icterus.  Neck: Normal range of motion. Neck supple. No tracheal deviation present. No thyromegaly present.  Large R sided neck mass that begins just lateral to the thyroid cartilage, approximately 8 cm in greatest dimension, small tumor extension through the skin, well healed surgical incision site noted. Tumor extends to the base of the mandible  Cardiovascular: Normal rate, regular rhythm and normal heart sounds.  Exam reveals no gallop and no friction rub.   No murmur heard. Pulmonary/Chest: Effort normal and breath sounds normal. She has no wheezes. She has no rales.  Abdominal: Soft. Bowel sounds are normal. She exhibits no distension and no mass. There is no tenderness. There is no rebound and no guarding.  Musculoskeletal: Normal range of motion. She exhibits no edema.  Lymphadenopathy:    She has no cervical adenopathy.  Neurological: She is alert and oriented to person, place, and time. She has normal reflexes. No cranial nerve deficit. Gait normal. Coordination normal.  Skin: Skin is warm and dry. No rash noted.  Psychiatric: Mood, memory, affect and judgment normal.  Nursing note and vitals reviewed.   LABORATORY DATA:  I have reviewed the data as listed  Lab Results  Component Value Date   WBC 12.1* 01/21/2016   HGB 10.9* 01/21/2016   HCT 33.9* 01/21/2016   MCV 94.4 01/21/2016   PLT 446* 01/21/2016   CMP     Component Value Date/Time   NA 131* 01/21/2016 0947   K 4.1 01/21/2016 0947   CL 91* 01/21/2016 0947   CO2 30 01/21/2016 0947   GLUCOSE 124* 01/21/2016 0947   BUN 19 01/21/2016 0947    CREATININE 0.83 01/21/2016 0947   CALCIUM 9.2 01/21/2016 0947   PROT 7.6 01/21/2016 0947   ALBUMIN 3.7 01/21/2016 0947   AST 20 01/21/2016 0947   ALT 13* 01/21/2016 0947   ALKPHOS 74 01/21/2016 0947   BILITOT 0.6 01/21/2016 0947   GFRNONAA >60 01/21/2016 0947   GFRAA >60 01/21/2016 0947     RADIOGRAPHIC STUDIES: I have personally reviewed the radiological images as listed and agreed with the findings in the report.  Ct Chest W Contrast  01/03/2016  CLINICAL DATA:  Squamous cell cancer the head and neck. EXAM: CT CHEST, ABDOMEN, AND PELVIS WITH CONTRAST  TECHNIQUE: Multidetector CT imaging of the chest, abdomen and pelvis was performed following the standard protocol during bolus administration of intravenous contrast. CONTRAST:  146mL OMNIPAQUE IOHEXOL 300 MG/ML  SOLN COMPARISON:  None. FINDINGS: CT CHEST FINDINGS Mediastinum/Lymph Nodes: There is no axillary lymphadenopathy. There is no hilar lymphadenopathy. No mediastinal lymphadenopathy. Heart size is normal The esophagus has normal imaging features. Lungs/Pleura: Centrilobular emphysema noted in the lung apices. 9 mm ground-glass nodule posterior right lung apex likely related to scarring. Musculoskeletal: 6.4 x 8.1 cm right cervical mass noted in the lower neck. Bone windows reveal no worrisome lytic or sclerotic osseous lesions. CT ABDOMEN PELVIS FINDINGS Hepatobiliary: No focal abnormality within the liver parenchyma. Numerous gallstones measure up to 5 mm diameter. Common bile duct is 9 mm in diameter at the level of the pancreatic head. Pancreas: No focal mass lesion. No dilatation of the main duct. No intraparenchymal cyst. No peripancreatic edema. Spleen: No splenomegaly. No focal mass lesion. Adrenals/Urinary Tract: No adrenal nodule or mass. Adrenal thickening is noted bilaterally. Kidneys are unremarkable. No evidence for hydroureter. The urinary bladder appears normal for the degree of distention. Stomach/Bowel: Stomach is  nondistended. No gastric wall thickening. No evidence of outlet obstruction. Duodenum is normally positioned as is the ligament of Treitz. No small bowel wall thickening. No small bowel dilatation. The terminal ileum is normal. The appendix is normal. Diverticular changes are noted in the left colon without evidence of diverticulitis. Vascular/Lymphatic: There is abdominal aortic atherosclerosis without aneurysm. There is no gastrohepatic or hepatoduodenal ligament lymphadenopathy. No intraperitoneal or retroperitoneal lymphadenopathy. No pelvic sidewall lymphadenopathy. Reproductive: Uterus is surgically absent. There is no adnexal mass. Other: No intraperitoneal free fluid. Musculoskeletal: Bone windows reveal no worrisome lytic or sclerotic osseous lesions. IMPRESSION: 1. Large right-sided neck mass. 2. No evidence for lymphadenopathy or metastatic disease in the chest, abdomen, or pelvis. 3. 9 mm ground-glass nodule in the posterior right lung apex is probably related to scarring given the underlying emphysema. Attention on follow-up is recommended. 4. Cholelithiasis. 5. Abdominal aortic atherosclerosis. Electronically Signed   By: Misty Stanley M.D.   On: 01/03/2016 08:45   Nm Bone Scan Whole Body  01/04/2016  CLINICAL DATA:  Head neck cancer. EXAM: NUCLEAR MEDICINE WHOLE BODY BONE SCAN TECHNIQUE: Whole body anterior and posterior images were obtained approximately 3 hours after intravenous injection of radiopharmaceutical. RADIOPHARMACEUTICALS:  CTs 01/03/2016 . MCi Technetium-41m MDP IV COMPARISON:  None. FINDINGS: Bilateral renal function and excretion. No acute bony abnormality identified. Increased activity noted over the right foot, clinical correlation suggested. These changes could be degenerative. No evidence of metastatic disease. IMPRESSION: 1. No evidence of metastatic disease. 2. Increased activity noted over the right foot, clinical correlation suggested. These changes may be degenerative .  Electronically Signed   By: Marcello Moores  Register   On: 01/04/2016 12:16   Ct Abdomen Pelvis W Contrast  01/03/2016  CLINICAL DATA:  Squamous cell cancer the head and neck. EXAM: CT CHEST, ABDOMEN, AND PELVIS WITH CONTRAST TECHNIQUE: Multidetector CT imaging of the chest, abdomen and pelvis was performed following the standard protocol during bolus administration of intravenous contrast. CONTRAST:  15mL OMNIPAQUE IOHEXOL 300 MG/ML  SOLN COMPARISON:  None. FINDINGS: CT CHEST FINDINGS Mediastinum/Lymph Nodes: There is no axillary lymphadenopathy. There is no hilar lymphadenopathy. No mediastinal lymphadenopathy. Heart size is normal The esophagus has normal imaging features. Lungs/Pleura: Centrilobular emphysema noted in the lung apices. 9 mm ground-glass nodule posterior right lung apex likely related to scarring. Musculoskeletal: 6.4 x 8.1  cm right cervical mass noted in the lower neck. Bone windows reveal no worrisome lytic or sclerotic osseous lesions. CT ABDOMEN PELVIS FINDINGS Hepatobiliary: No focal abnormality within the liver parenchyma. Numerous gallstones measure up to 5 mm diameter. Common bile duct is 9 mm in diameter at the level of the pancreatic head. Pancreas: No focal mass lesion. No dilatation of the main duct. No intraparenchymal cyst. No peripancreatic edema. Spleen: No splenomegaly. No focal mass lesion. Adrenals/Urinary Tract: No adrenal nodule or mass. Adrenal thickening is noted bilaterally. Kidneys are unremarkable. No evidence for hydroureter. The urinary bladder appears normal for the degree of distention. Stomach/Bowel: Stomach is nondistended. No gastric wall thickening. No evidence of outlet obstruction. Duodenum is normally positioned as is the ligament of Treitz. No small bowel wall thickening. No small bowel dilatation. The terminal ileum is normal. The appendix is normal. Diverticular changes are noted in the left colon without evidence of diverticulitis. Vascular/Lymphatic: There is  abdominal aortic atherosclerosis without aneurysm. There is no gastrohepatic or hepatoduodenal ligament lymphadenopathy. No intraperitoneal or retroperitoneal lymphadenopathy. No pelvic sidewall lymphadenopathy. Reproductive: Uterus is surgically absent. There is no adnexal mass. Other: No intraperitoneal free fluid. Musculoskeletal: Bone windows reveal no worrisome lytic or sclerotic osseous lesions. IMPRESSION: 1. Large right-sided neck mass. 2. No evidence for lymphadenopathy or metastatic disease in the chest, abdomen, or pelvis. 3. 9 mm ground-glass nodule in the posterior right lung apex is probably related to scarring given the underlying emphysema. Attention on follow-up is recommended. 4. Cholelithiasis. 5. Abdominal aortic atherosclerosis. Electronically Signed   By: Misty Stanley M.D.   On: 01/03/2016 08:45   Ir Gastrostomy Tube  01/17/2016  INDICATION: Head neck squamous cell carcinoma EXAM: TWENTY FRENCH PULL-THROUGH GASTROSTOMY Date:  3/13/20173/13/2017 3:40 pm Radiologist:  M. Daryll Brod, MD Guidance:  Fluoroscopic MEDICATIONS: 2 g Ancef; Antibiotics were administered within 1 hour of the procedure. Glucagon 1 mg IV ANESTHESIA/SEDATION: Versed 2.5 mg IV; Fentanyl 125 mcg IV Moderate Sedation Time:  32 The patient was continuously monitored during the procedure by the interventional radiology nurse under my direct supervision. CONTRAST:  12 cc Omnipaque 300 - administered into the gastric lumen. FLUOROSCOPY TIME:  Fluoroscopy Time: 5 minutes 48 seconds (48 mGy). COMPLICATIONS: None immediate. PROCEDURE: Informed consent was obtained from the patient following explanation of the procedure, risks, benefits and alternatives. The patient understands, agrees and consents for the procedure. All questions were addressed. A time out was performed. Maximal barrier sterile technique utilized including caps, mask, sterile gowns, sterile gloves, large sterile drape, hand hygiene, and betadine prep. The left  upper quadrant was sterilely prepped and draped. An oral gastric catheter was inserted into the stomach under fluoroscopy. The existing nasogastric feeding tube was removed. Air was injected into the stomach for insufflation and visualization under fluoroscopy. The air distended stomach was confirmed beneath the anterior abdominal wall in the frontal and lateral projections. Under sterile conditions and local anesthesia, a 40 gauge trocar needle was utilized to access the stomach percutaneously beneath the left subcostal margin. Needle position was confirmed within the stomach under biplane fluoroscopy. Contrast injection confirmed position also. A single T tack was deployed for gastropexy. Over an Amplatz guide wire, a 9-French sheath was inserted into the stomach. A snare device was utilized to capture the oral gastric catheter. The snare device was pulled retrograde from the stomach up the esophagus and out the oropharynx. The 20-French pull-through gastrostomy was connected to the snare device and pulled antegrade through the oropharynx down the  esophagus into the stomach and then through the percutaneous tract external to the patient. The gastrostomy was assembled externally. Contrast injection confirms position in the stomach. Images were obtained for documentation. The patient tolerated procedure well. No immediate complication. IMPRESSION: Fluoroscopic insertion of a 20-French "pull-through" gastrostomy. Electronically Signed   By: Jerilynn Mages.  Shick M.D.   On: 01/17/2016 15:56   Nm Pet Image Initial (pi) Skull Base To Thigh  01/19/2016  CLINICAL DATA:  Initial treatment strategy for squamous cell carcinoma of the right neck. EXAM: NUCLEAR MEDICINE PET SKULL BASE TO THIGH TECHNIQUE: 7.61 mCi F-18 FDG was injected intravenously. Full-ring PET imaging was performed from the skull base to thigh after the radiotracer. CT data was obtained and used for attenuation correction and anatomic localization. FASTING BLOOD  GLUCOSE:  Value: 98 mg/dl COMPARISON:  CTs of the neck, 12/17/2015, and chest, abdomen and pelvis, 01/02/2015. Bone scan 01/03/2015. FINDINGS: NECK The dominant right neck mass is markedly hypermetabolic with an SUV max of 20.7. This mass measures approximately 7.0 x 6.8 cm on image 36. There are multiple additional hypermetabolic cervical lymph nodes bilaterally. These include a right level 4 node measuring 14 mm on image 43 (SUV max 9.9), and small left-sided level 2 (10 mm on image 28) and level 4 nodes on image 41 (measuring 7 and 10 mm, SUV max 11.1). The dominant right neck mass appears contiguous with the right lobe of the thyroid and maybe invading it.No lesions of the pharyngeal mucosal space are identified. There is stable mass effect on the larynx which is displaced to the left. CHEST There are no hypermetabolic mediastinal, hilar or axillary lymph nodes. There is no suspicious pulmonary activity. Emphysema is noted. The small ground-glass nodule previously noted in the right upper lobe measures 6 mm on image number 6, too small to evaluate by PET-CT. ABDOMEN/PELVIS There is no hypermetabolic activity within the liver, adrenal glands, spleen or pancreas. There is no hypermetabolic nodal activity. There is physiologic activity within the left anterior abdominal wall surrounding a percutaneous G-tube. There is a small amount of extraluminal air within the left upper quadrant of the abdomen and in the anterior abdominal wall, attributed to this G-tube which was placed 2 days ago. Cholelithiasis, mild aortoiliac atherosclerosis and sigmoid diverticulosis are noted. SKELETON There is no hypermetabolic activity to suggest osseous metastatic disease. IMPRESSION: 1. The dominant right neck mass is hypermetabolic. In addition, there are hypermetabolic smaller cervical lymph nodes bilaterally. 2. No discrete lesion of the pharyngeal mucosal space identified. The dominant right neck mass and its hypermetabolic  activity are contiguous with the right thyroid lobe and may be invading it. 3. No suspicious activity within the chest, abdomen or pelvis. 4. Small ground-glass nodule in the right upper lobe is too small to evaluate by PET-CT. Attention on follow-up recommended. 5. Small amount of extraluminal air in the upper abdomen attributed to recent G-tube placement. Electronically Signed   By: Richardean Sale M.D.   On: 01/19/2016 13:01   Ir Fluoro Guide Cv Line Left  01/17/2016  CLINICAL DATA:  Head and neck squamous cell carcinoma EXAM: LEFT INTERNAL JUGULAR SINGLE LUMEN POWER PORT CATHETER INSERTION Date:  3/13/20173/13/2017 2:57 pm Radiologist:  M. Daryll Brod, MD Guidance:  Ultrasound and fluoroscopic MEDICATIONS: 2 g Ancef; The antibiotic was administered within an appropriate time interval prior to skin puncture. ANESTHESIA/SEDATION: Versed 1.5 mg IV; Fentanyl 75 mcg IV; Moderate Sedation Time:  30 The patient was continuously monitored during the procedure by the interventional  radiology nurse under my direct supervision. FLUOROSCOPY TIME:  48 seconds (6 mGy) COMPLICATIONS: None immediate. CONTRAST:  None. PROCEDURE: Informed consent was obtained from the patient following explanation of the procedure, risks, benefits and alternatives. The patient understands, agrees and consents for the procedure. All questions were addressed. A time out was performed. Maximal barrier sterile technique utilized including caps, mask, sterile gowns, sterile gloves, large sterile drape, hand hygiene, and 2% chlorhexidine scrub. Under sterile conditions and local anesthesia, left internal jugular micropuncture venous access was performed. Access was performed with ultrasound. Images were obtained for documentation. A guide wire was inserted followed by a transitional dilator. This allowed insertion of a guide wire and catheter into the IVC. Measurements were obtained from the SVC / RA junction back to the left IJ venotomy site. In  the left infraclavicular chest, a subcutaneous pocket was created over the second anterior rib. This was done under sterile conditions and local anesthesia. 1% lidocaine with epinephrine was utilized for this. A 2.5 cm incision was made in the skin. Blunt dissection was performed to create a subcutaneous pocket over the right pectoralis major muscle. The pocket was flushed with saline vigorously. There was adequate hemostasis. The port catheter was assembled and checked for leakage. The port catheter was secured in the pocket with two retention sutures. The tubing was tunneled subcutaneously to the left venotomy site and inserted into the SVC/RA junction through a valved peel-away sheath. Position was confirmed with fluoroscopy. Images were obtained for documentation. The patient tolerated the procedure well. No immediate complications. Incisions were closed in a two layer fashion with 4 - 0 Vicryl suture. Dermabond was applied to the skin. The port catheter was accessed, blood was aspirated followed by saline and heparin flushes. Needle was removed. A dry sterile dressing was applied. IMPRESSION: Ultrasound and fluoroscopically guided left internal jugular single lumen power port catheter insertion. Tip in the SVC/RA junction. Catheter ready for use. Electronically Signed   By: Jerilynn Mages.  Shick M.D.   On: 01/17/2016 15:58   Ir US Guide Vasc Access Left  01/17/2016  CLINICAL DATA:  Head and neck squamous cell carcinoma EXAM: LEFT INTERNAL JUGULAR SINGLE LUMEN POWER PORT CATHETER INSERTION Date:  3/13/20173/13/2017 2:57 pm Radiologist:  M. Daryll Brod, MD Guidance:  Ultrasound and fluoroscopic MEDICATIONS: 2 g Ancef; The antibiotic was administered within an appropriate time interval prior to skin puncture. ANESTHESIA/SEDATION: Versed 1.5 mg IV; Fentanyl 75 mcg IV; Moderate Sedation Time:  30 The patient was continuously monitored during the procedure by the interventional radiology nurse under my direct supervision.  FLUOROSCOPY TIME:  48 seconds (6 mGy) COMPLICATIONS: None immediate. CONTRAST:  None. PROCEDURE: Informed consent was obtained from the patient following explanation of the procedure, risks, benefits and alternatives. The patient understands, agrees and consents for the procedure. All questions were addressed. A time out was performed. Maximal barrier sterile technique utilized including caps, mask, sterile gowns, sterile gloves, large sterile drape, hand hygiene, and 2% chlorhexidine scrub. Under sterile conditions and local anesthesia, left internal jugular micropuncture venous access was performed. Access was performed with ultrasound. Images were obtained for documentation. A guide wire was inserted followed by a transitional dilator. This allowed insertion of a guide wire and catheter into the IVC. Measurements were obtained from the SVC / RA junction back to the left IJ venotomy site. In the left infraclavicular chest, a subcutaneous pocket was created over the second anterior rib. This was done under sterile conditions and local anesthesia. 1% lidocaine with epinephrine  was utilized for this. A 2.5 cm incision was made in the skin. Blunt dissection was performed to create a subcutaneous pocket over the right pectoralis major muscle. The pocket was flushed with saline vigorously. There was adequate hemostasis. The port catheter was assembled and checked for leakage. The port catheter was secured in the pocket with two retention sutures. The tubing was tunneled subcutaneously to the left venotomy site and inserted into the SVC/RA junction through a valved peel-away sheath. Position was confirmed with fluoroscopy. Images were obtained for documentation. The patient tolerated the procedure well. No immediate complications. Incisions were closed in a two layer fashion with 4 - 0 Vicryl suture. Dermabond was applied to the skin. The port catheter was accessed, blood was aspirated followed by saline and heparin  flushes. Needle was removed. A dry sterile dressing was applied. IMPRESSION: Ultrasound and fluoroscopically guided left internal jugular single lumen power port catheter insertion. Tip in the SVC/RA junction. Catheter ready for use. Electronically Signed   By: Jerilynn Mages.  Shick M.D.   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 Benjamine Mola 12/21/2015 No obvious primary lesion noted on panendoscopy examination CT neck 12/17/2015 large infiltrative tumor mass extends to skin surface, infiltrates R patorid, R submandibular, R carotid sheath,Thrombosis of R IJ vein, multiple satellite nodules Centrilobular emphysema P16 negative Tobacco Use Tobacco Abuse Port a cath/FT placement on 01/17/2016 Multiple dental extractions with Dr. Enrique Sack 01/13/3016  Plan is for her to begin concurrent therapy on 01/21/2016. She is here today for chemotherapy teaching, nutritional plan has been developed and patient has meet with our nutritionist.  She needs a referral to speech therapy. She has already been referred to social work. Thyroid studies will be added prior to initiation of therapy.  I reviewed the side effects of therapy.  I reminded the patient that the clinic is open for her anytime she needs hydration. We discussed how to manage mucositis and I advised her we will be discussing this moving forward.  She will be seen weekly for weight checks and pain management.  Care plan will be coordinated with Dr. Isidore Moos.   Orders Placed This Encounter  Procedures  . T4, free    Standing Status: Future     Number of Occurrences: 1     Standing Expiration Date: 01/20/2017  . Ambulatory referral to Speech Therapy    Referral Priority:  Routine    Referral Type:  Speech Therapy    Referral Reason:  Specialty Services Required    Requested Specialty:  Speech  Pathology    Number of Visits Requested:  1    All questions were answered. The patient knows to call the clinic with any problems, questions or concerns.  This document serves as a record of services personally performed by Ancil Linsey, MD. It was created on her behalf by Arlyce Harman, a trained medical scribe. The creation of this record is based on the scribe's personal observations and the provider's statements to them. This document has been checked and approved by the attending provider.  I have reviewed the above documentation for accuracy and completeness, and I agree with the above.  This note was electronically signed.    Molli Hazard, MD  01/22/2016 3:51 PM

## 2016-01-24 ENCOUNTER — Ambulatory Visit (HOSPITAL_COMMUNITY): Payer: Self-pay | Admitting: Dentistry

## 2016-01-24 ENCOUNTER — Encounter (HOSPITAL_COMMUNITY): Payer: Self-pay | Admitting: Dentistry

## 2016-01-24 VITALS — BP 106/64 | HR 96 | Temp 99.6°F

## 2016-01-24 DIAGNOSIS — B370 Candidal stomatitis: Secondary | ICD-10-CM

## 2016-01-24 DIAGNOSIS — Z01818 Encounter for other preprocedural examination: Secondary | ICD-10-CM

## 2016-01-24 DIAGNOSIS — C760 Malignant neoplasm of head, face and neck: Secondary | ICD-10-CM

## 2016-01-24 DIAGNOSIS — K08109 Complete loss of teeth, unspecified cause, unspecified class: Secondary | ICD-10-CM

## 2016-01-24 DIAGNOSIS — K0820 Unspecified atrophy of edentulous alveolar ridge: Secondary | ICD-10-CM

## 2016-01-24 MED ORDER — FLUCONAZOLE 100 MG PO TABS: ORAL_TABLET | ORAL | Status: DC

## 2016-01-24 NOTE — Patient Instructions (Signed)
1. Use salt water rinses as needed to aid healing. 2. Brush tongue daily. 3. Take Diflucan as prescribed until all gone. 4. Advance diet as tolerated. Suggest use of Ensure Plus and yogurt products with active cultures. 5. Call for follow-up appointment after chemoradiation therapy or as needed. Call if problems arise with healing before then.  Dr. Enrique Sack

## 2016-01-24 NOTE — Progress Notes (Signed)
POST OPERATIVE NOTE:  01/24/2016 Gina Frederick [id number]  VITALS: BP 106/64 mmHg  Pulse 96  Temp(Src) 99.6 F (37.6 C) (Oral)  LABS:  Lab Results  Component Value Date   WBC 12.1* 01/21/2016   HGB 10.9* 01/21/2016   HCT 33.9* 01/21/2016   MCV 94.4 01/21/2016   PLT 446* 01/21/2016   BMET    Component Value Date/Time   NA 131* 01/21/2016 0947   K 4.1 01/21/2016 0947   CL 91* 01/21/2016 0947   CO2 30 01/21/2016 0947   GLUCOSE 124* 01/21/2016 0947   BUN 19 01/21/2016 0947   CREATININE 0.83 01/21/2016 0947   CALCIUM 9.2 01/21/2016 0947   GFRNONAA >60 01/21/2016 0947   GFRAA >60 01/21/2016 0947    Lab Results  Component Value Date   INR 1.01 01/17/2016   No results found for: PTT   Gina Frederick is status post extraction of remaining teeth with alveoloplasty and pre-prosthetic surgery as needed in the operating room with general anesthesia on 01/13/2016. The patient now presents for evaluation of healing and suture removal.  SUBJECTIVE: Patient denies having had any problems with dental pain. Patient indicates that some stitches still remain.  EXAM: There is no sign of oral infection, heme, or ooze. Patient is healing in by generalized primary closure. The patient does appear to have some white plaque accumulations on the back of her throat, left and right buccal mucosa, and dorsum of tongue consistent with oral candidiasis. The patient is now edentulous. There is atrophy of the edentulous alveolar ridges.  PROCEDURE: The patient was given a chlorhexidine gluconate rinse for 30 seconds. Sutures were then removed without complication. Patient tolerated the procedure well.  ASSESSMENT: Post operative course is consistent with dental procedures performed in the operating room with general anesthesia. Patient is edentulous There is atrophy of the edentulous alveolar ridges Patient has white plaques within the mouth consistent with oral candidiasis.  PLAN: 1.  Discussion with Dr. Whitney Muse. Okay to proceed with Diflucan 100 mg. Patient is to take 2 tablets stat and then 1 daily until all gone for one week. 2. Salt water rinses as needed every 2 hours while awake to aid healing. 3. Brush tongue daily. 4. Advance diet as tolerated. Suggest addition of Ensure Plus, boost, or other nutritional supplement as well as yogurt or [place] yogurt daily. 5. Call if patient desires oral evaluation during chemoradiation therapy in 2-3 weeks, otherwise, patient will be seen 1 month after chemoradiation therapy has been completed. 6. Upper and lower complete dentures by the dentist of her choice 3 months after the radiation therapy has been completed.  Lenn Cal, DDS

## 2016-01-25 ENCOUNTER — Encounter: Payer: Self-pay | Admitting: *Deleted

## 2016-01-25 NOTE — Progress Notes (Signed)
[facility] Psychosocial Distress Screening Clinical Social Work  Clinical Social Work was referred by distress screening protocol.  The patient scored a 5 on the Psychosocial Distress Thermometer which indicates moderate distress. Clinical Social Worker phoned pt to assess for distress and other psychosocial needs. CSW left message for pt, but had previously talked with her on 01/11/16. Per RN Navigator, pt was doing well and coping adequately currently. CSW will follow and await return call. CSW available to address needs as they are identified.   ONCBCN DISTRESS SCREENING 01/21/2016  Screening Type Initial Screening  Distress experienced in past week (1-10) 5  Emotional problem type (No Data)    Clinical Social Worker follow up needed: Yes.    If yes, follow up plan: See above Loren Racer, [place] Tuesdays   [phone]

## 2016-01-31 ENCOUNTER — Encounter (HOSPITAL_BASED_OUTPATIENT_CLINIC_OR_DEPARTMENT_OTHER): Payer: Medicaid Other

## 2016-01-31 ENCOUNTER — Encounter (HOSPITAL_COMMUNITY): Payer: Self-pay | Admitting: Hematology & Oncology

## 2016-01-31 ENCOUNTER — Encounter (HOSPITAL_BASED_OUTPATIENT_CLINIC_OR_DEPARTMENT_OTHER): Payer: Medicaid Other | Admitting: Hematology & Oncology

## 2016-01-31 ENCOUNTER — Ambulatory Visit (HOSPITAL_COMMUNITY): Payer: Self-pay | Admitting: Oncology

## 2016-01-31 VITALS — BP 151/91 | HR 87 | Temp 98.4°F | Resp 18 | Wt 146.8 lb

## 2016-01-31 DIAGNOSIS — C760 Malignant neoplasm of head, face and neck: Secondary | ICD-10-CM

## 2016-01-31 DIAGNOSIS — E871 Hypo-osmolality and hyponatremia: Secondary | ICD-10-CM | POA: Diagnosis not present

## 2016-01-31 DIAGNOSIS — Z5111 Encounter for antineoplastic chemotherapy: Secondary | ICD-10-CM | POA: Diagnosis not present

## 2016-01-31 DIAGNOSIS — Z720 Tobacco use: Secondary | ICD-10-CM | POA: Diagnosis not present

## 2016-01-31 DIAGNOSIS — I10 Essential (primary) hypertension: Secondary | ICD-10-CM | POA: Diagnosis not present

## 2016-01-31 DIAGNOSIS — C539 Malignant neoplasm of cervix uteri, unspecified: Secondary | ICD-10-CM | POA: Diagnosis not present

## 2016-01-31 LAB — CBC WITH DIFFERENTIAL/PLATELET
BASOS ABS: 0 10*3/uL (ref 0.0–0.1)
BASOS PCT: 0 %
Eosinophils Absolute: 0.1 10*3/uL (ref 0.0–0.7)
Eosinophils Relative: 1 %
HEMATOCRIT: 31.3 % — AB (ref 36.0–46.0)
HEMOGLOBIN: 10.5 g/dL — AB (ref 12.0–15.0)
LYMPHS PCT: 12 %
Lymphs Abs: 1.1 10*3/uL (ref 0.7–4.0)
MCH: 30 pg (ref 26.0–34.0)
MCHC: 33.5 g/dL (ref 30.0–36.0)
MCV: 89.4 fL (ref 78.0–100.0)
MONOS PCT: 8 %
Monocytes Absolute: 0.7 10*3/uL (ref 0.1–1.0)
NEUTROS ABS: 6.8 10*3/uL (ref 1.7–7.7)
NEUTROS PCT: 79 %
Platelets: 598 10*3/uL — ABNORMAL HIGH (ref 150–400)
RBC: 3.5 MIL/uL — ABNORMAL LOW (ref 3.87–5.11)
RDW: 12.8 % (ref 11.5–15.5)
WBC: 8.5 10*3/uL (ref 4.0–10.5)

## 2016-01-31 LAB — COMPREHENSIVE METABOLIC PANEL
ALBUMIN: 3.7 g/dL (ref 3.5–5.0)
ALT: 16 U/L (ref 14–54)
ANION GAP: 10 (ref 5–15)
AST: 20 U/L (ref 15–41)
Alkaline Phosphatase: 100 U/L (ref 38–126)
BUN: 11 mg/dL (ref 6–20)
CHLORIDE: 90 mmol/L — AB (ref 101–111)
CO2: 24 mmol/L (ref 22–32)
Calcium: 8.8 mg/dL — ABNORMAL LOW (ref 8.9–10.3)
Creatinine, Ser: 0.59 mg/dL (ref 0.44–1.00)
Glucose, Bld: 108 mg/dL — ABNORMAL HIGH (ref 65–99)
POTASSIUM: 3.9 mmol/L (ref 3.5–5.1)
Sodium: 124 mmol/L — ABNORMAL LOW (ref 135–145)
Total Bilirubin: 0.4 mg/dL (ref 0.3–1.2)
Total Protein: 7.5 g/dL (ref 6.5–8.1)

## 2016-01-31 LAB — MAGNESIUM: MAGNESIUM: 1.8 mg/dL (ref 1.7–2.4)

## 2016-01-31 MED ORDER — SODIUM CHLORIDE 0.9 % IV SOLN
100.0000 mg/m2 | Freq: Once | INTRAVENOUS | Status: AC
  Administered 2016-01-31: 177 mg via INTRAVENOUS
  Filled 2016-01-31: qty 100

## 2016-01-31 MED ORDER — PALONOSETRON HCL INJECTION 0.25 MG/5ML
0.2500 mg | Freq: Once | INTRAVENOUS | Status: AC
  Administered 2016-01-31: 0.25 mg via INTRAVENOUS
  Filled 2016-01-31: qty 5

## 2016-01-31 MED ORDER — OXYCODONE-ACETAMINOPHEN 10-325 MG PO TABS
ORAL_TABLET | ORAL | Status: DC
Start: 2016-01-31 — End: 2016-05-19

## 2016-01-31 MED ORDER — POTASSIUM CHLORIDE 2 MEQ/ML IV SOLN
Freq: Once | INTRAVENOUS | Status: AC
  Administered 2016-01-31: 10:00:00 via INTRAVENOUS
  Filled 2016-01-31: qty 10

## 2016-01-31 MED ORDER — LORAZEPAM 2 MG/ML IJ SOLN
INTRAMUSCULAR | Status: AC
  Filled 2016-01-31: qty 1

## 2016-01-31 MED ORDER — LORAZEPAM 2 MG/ML IJ SOLN
0.5000 mg | Freq: Once | INTRAMUSCULAR | Status: AC
  Administered 2016-01-31: 0.5 mg via INTRAVENOUS

## 2016-01-31 MED ORDER — SODIUM CHLORIDE 0.9 % IV SOLN
Freq: Once | INTRAVENOUS | Status: AC
  Administered 2016-01-31: 12:00:00 via INTRAVENOUS
  Filled 2016-01-31: qty 5

## 2016-01-31 MED ORDER — HEPARIN SOD (PORK) LOCK FLUSH 100 UNIT/ML IV SOLN
INTRAVENOUS | Status: AC
  Filled 2016-01-31: qty 5

## 2016-01-31 MED ORDER — PEGFILGRASTIM 6 MG/0.6ML ~~LOC~~ PSKT
6.0000 mg | PREFILLED_SYRINGE | Freq: Once | SUBCUTANEOUS | Status: AC
  Administered 2016-01-31: 6 mg via SUBCUTANEOUS
  Filled 2016-01-31: qty 0.6

## 2016-01-31 MED ORDER — HEPARIN SOD (PORK) LOCK FLUSH 100 UNIT/ML IV SOLN
500.0000 [IU] | Freq: Once | INTRAVENOUS | Status: AC | PRN
  Administered 2016-01-31: 500 [IU]

## 2016-01-31 MED ORDER — SODIUM CHLORIDE 0.9% FLUSH: 10.0000 mL | INTRAVENOUS | Status: DC | PRN

## 2016-01-31 NOTE — Progress Notes (Signed)
[place] at [facility] Progress Note  Patient Care Team: Noel Journey. 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 Benjamine Mola 12/21/2015 No obvious primary lesion noted on panendoscopy examination CT neck 12/17/2015 large infiltrative tumor mass extends to skin surface, infiltrates R patorid, R submandibular, R carotid sheath,Thrombosis of R IJ vein, multiple satellite nodules Centrilobular emphysema P16 negative Tobacco Abuse Port a cath/FT placement on 01/17/2016 Multiple dental extractions with Dr. Enrique Sack 01/13/3016   HISTORY OF PRESENTING ILLNESS:  Gina Frederick 60 y.o. 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   Gina Frederick is alone and here for Cycle #1 Cisplatin today. She is receiving treatment during our visit. I personally reviewed and went over laboratory studies with the patient at length, including her low sodium levels.  Patient reports she has been eating and drinking at home, however yesterday she did not eat very much. Stating that yesterday was one of her worst days. She has no issues with her feeding tube.  Reports her pain comes and goes, though the pain in her right neck is worse than it has been. She would like something for this pain. She is able to take her anti nausea pills without issue but is apprehensive about a large pill. She reports this pain is worse some days and better on others.   She has a good support system and they get upset with how much she continues to do, however she does not like to sit still. She is still active. 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   She notes she has had appropriate education on using her FT. She currently does not have any major questions or concerns.   MEDICAL HISTORY:  Past Medical History  Diagnosis Date  . Hypertension   . Arthritis     hands  . Neck mass     right neck(surfaced 11'16,then progressively enlarged"- denies swallwing or breathing difficulty.  . Hyperlipidemia   . Cancer ([place])     dx. squamous cell carcinoma-right neck    SURGICAL HISTORY: Past Surgical History  Procedure Laterality Date  . Tonsillectomy    . Knee arthroscopy      right  . Cesarean section    . Mass biopsy Right 12/21/2015    Procedure: NECK MASS BIOPSY;  Surgeon: Leta Baptist, MD;  Location: [facility];  Service: ENT;  Laterality: Right;  . Panendoscopy N/A 12/21/2015    Procedure: PANENDOSCOPY;  Surgeon: Leta Baptist, MD;  Location: [facility];  Service: ENT;  Laterality: N/A;  . Multiple extractions with alveoloplasty N/A 01/13/2016    Procedure: EXTRACTION OF TOOTH #'S 6-12,15, 19-28, 30 WITH ALVEOLOPLASTY AND BILATERAL MAXILLARY TUBEROSITY REDUCTIONS;  Surgeon: Lenn Cal, DDS;  Location: [facility];  Service: Oral Surgery;  Laterality: N/A;    SOCIAL HISTORY: Social History   Social History  . Marital Status: Divorced    Spouse Name: N/A  . Number of Children: 1  . Years of Education: N/A   Occupational History  . Not on file.   Social History Main Topics  . Smoking status: Current Every Day Smoker --  0.25 packs/day    Types: Cigarettes  . Smokeless tobacco: Never Used     Comment: cut back to 1 cigarette per day  . Alcohol Use: No  . Drug Use: No  . Sexual Activity: No   Other Topics Concern  . Not on file   Social History Narrative   She is divorced. One daughter, no grandchildren Smokes, notes that she is down to 3 a day. Began smoking "probably in her 10's." She says she has quit "on and off through the years." Denies problems with EtOH. 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 Quit working when her daughter was "of age to go to school," then stayed home with her daughter. For hobbies, she says she likes to read.  Is currently the caregiver for a lady with diabetes.  She does light housekeeping and keeps her on her medications, and cooks. The son of the woman she cares for, Herbie Baltimore, notes that her good cooking has brought his momma's blood pressure and blood sugar down.  She notes she has a good support system with her sister, and with the son of the woman she takes care of.  FAMILY HISTORY: Family History  Problem Relation Age of Onset  . Alzheimer's disease Mother   . Congestive Heart Failure Father    indicated that her mother is deceased. She indicated that her father is deceased.   Mother and father are deceased. Mother was 23; died of Alzheimer's. Father was 48; congestive heart failure. Has a sister, age 25 this year. Had a brother, deceased. He took his own life. 4 nieces and nephews.  ALLERGIES:  is allergic to codeine.  MEDICATIONS:  Current Outpatient Prescriptions  Medication Sig Dispense Refill  . acetaminophen (TYLENOL) 500 MG tablet Take 500 mg by mouth every 6 (six) hours as needed.    Marland Kitchen CISPLATIN IV Inject into the vein. Reported on 01/21/2016    . diphenhydrAMINE (BENADRYL) 25 MG tablet Take 25 mg by mouth every 6 (six) hours as needed.    . fluconazole (DIFLUCAN) 100 MG tablet Take two tablets now, then one daily until all gone. 8 tablet 0  . lidocaine-prilocaine (EMLA) cream Apply a quarter size amount to port site 1 hour prior to chemo. Do not rub in. Cover with plastic wrap. 30 g 3  . lisinopril-hydrochlorothiazide (PRINZIDE,ZESTORETIC) 20-12.5 MG tablet Take 1 tablet by mouth daily.    Marland Kitchen lovastatin (MEVACOR) 20 MG tablet Take 20 mg by mouth at bedtime. Reported on 01/21/2016    . naproxen sodium (ANAPROX) 220 MG tablet  Take 220 mg by mouth 2 (two) times daily as needed (Pain).    Bethann Humble Sulfate (EYE DROPS ALLERGY RELIEF OP) Apply 1 drop to eye 2 (two) times daily as needed (Itching eyes).    . ondansetron (ZOFRAN) 8 MG tablet Take 1 tablet (8 mg total) by mouth every 8 (eight) hours as needed for nausea or vomiting. (Patient not taking: Reported on 01/31/2016) 30 tablet 2  . oxyCODONE-acetaminophen (PERCOCET) 10-325 MG tablet Take every 2-4 hours as needed for pain 60 tablet 0  . prochlorperazine (COMPAZINE) 10 MG tablet Take 1 tablet (10 mg total) by mouth every 6 (six) hours as needed for nausea or vomiting. (Patient not taking: Reported on 01/21/2016) 30 tablet 2   No current facility-administered medications for this visit.   Facility-Administered Medications Ordered in Other Visits  Medication Dose Route Frequency Provider Last  Rate Last Dose  . CISplatin (PLATINOL) 177 mg in sodium chloride 0.9 % 500 mL chemo infusion  100 mg/m2 (Treatment Plan Actual) Intravenous Once Patrici Ranks, MD      . fosaprepitant (EMEND) 150 mg, dexamethasone (DECADRON) 12 mg in sodium chloride 0.9 % 145 mL IVPB   Intravenous Once Patrici Ranks, MD 454 mL/hr at 01/31/16 1147    . heparin lock flush 100 unit/mL  500 Units Intracatheter Once PRN Patrici Ranks, MD      . pegfilgrastim (NEULASTA ONPRO KIT) injection 6 mg  6 mg Subcutaneous Once Patrici Ranks, MD      . sodium chloride flush (NS) 0.9 % injection 10 mL  10 mL Intracatheter PRN Patrici Ranks, MD        Review of Systems  Constitutional: Negative for fever, chills and malaise/fatigue.  HENT: Negative for congestion, hearing loss, nosebleeds, sore throat and tinnitus.   Eyes: Negative.  Negative for blurred vision, double vision, pain and discharge.  Respiratory: Positive for cough. Negative for hemoptysis, sputum production, shortness of breath and wheezing.        "Light cough" since starting her blood pressure medication.    Cardiovascular: Negative.  Negative for chest pain, palpitations, claudication, leg swelling and PND.  Gastrointestinal: Negative.  Negative for heartburn, nausea, vomiting, abdominal pain, diarrhea, constipation, blood in stool and melena.  Genitourinary: Negative.  Negative for dysuria, urgency, frequency and hematuria.  Musculoskeletal: Positive for neck pain. Negative for myalgias, joint pain and falls.       Worsening neck pain.  Skin: Negative.  Negative for itching and rash.  Neurological: Negative.  Negative for dizziness, tingling, tremors, sensory change, speech change, focal weakness, seizures, loss of consciousness, weakness and headaches.  Endo/Heme/Allergies: Negative.  Does not bruise/bleed easily.  Psychiatric/Behavioral: Negative.  Negative for depression, suicidal ideas, memory loss and substance abuse. The patient is not nervous/anxious and does not have insomnia.   All other systems reviewed and are negative.  14 point ROS was done and is otherwise as detailed above or in HPI   PHYSICAL EXAMINATION: ECOG PERFORMANCE STATUS: 1 - Symptomatic but completely ambulatory  Vitals with BMI 01/31/2016  Height   Weight 146 lbs 13 oz  BMI   Systolic 782  Diastolic 82  Pulse 423  Respirations 18    Physical Exam  Constitutional: She is oriented to person, place, and time and well-developed, well-nourished, and in no distress.  Wears glasses. In treatment chair.  HENT:  Head: Normocephalic and atraumatic.  Nose: Nose normal.  Mouth/Throat: Oropharynx is clear and moist. No oropharyngeal exudate.  Limited as to how far she can open her mouth, due to her mass.  Eyes: Conjunctivae and EOM are normal. Pupils are equal, round, and reactive to light. Right eye exhibits no discharge. Left eye exhibits no discharge. No scleral icterus.  Neck: Normal range of motion. Neck supple. No tracheal deviation present. No thyromegaly present.  Large R sided neck mass that begins just  lateral to the thyroid cartilage, approximately 8 cm in greatest dimension, small tumor extension through the skin, well healed surgical incision site noted. Tumor extends to the base of the mandible  Cardiovascular: Normal rate, regular rhythm and normal heart sounds.  Exam reveals no gallop and no friction rub.   No murmur heard. Pulmonary/Chest: Effort normal and breath sounds normal. She has no wheezes. She has no rales.  Abdominal: Soft. Bowel sounds are normal. She exhibits no distension and no  mass. There is no tenderness. There is no rebound and no guarding.  Musculoskeletal: Normal range of motion. She exhibits no edema.  Lymphadenopathy:    She has no cervical adenopathy.  Neurological: She is alert and oriented to person, place, and time. She has normal reflexes. No cranial nerve deficit. Gait normal. Coordination normal.  Skin: Skin is warm and dry. No rash noted.  Psychiatric: Mood, memory, affect and judgment normal.  Nursing note and vitals reviewed.   LABORATORY DATA:  I have reviewed the data as listed  Lab Results  Component Value Date   WBC 8.5 01/31/2016   HGB 10.5* 01/31/2016   HCT 31.3* 01/31/2016   MCV 89.4 01/31/2016   PLT 598* 01/31/2016   CMP     Component Value Date/Time   NA 124* 01/31/2016 0943   K 3.9 01/31/2016 0943   CL 90* 01/31/2016 0943   CO2 24 01/31/2016 0943   GLUCOSE 108* 01/31/2016 0943   BUN 11 01/31/2016 0943   CREATININE 0.59 01/31/2016 0943   CALCIUM 8.8* 01/31/2016 0943   PROT 7.5 01/31/2016 0943   ALBUMIN 3.7 01/31/2016 0943   AST 20 01/31/2016 0943   ALT 16 01/31/2016 0943   ALKPHOS 100 01/31/2016 0943   BILITOT 0.4 01/31/2016 0943   GFRNONAA >60 01/31/2016 0943   GFRAA >60 01/31/2016 0943   Results for JAQUETTA, CURRIER (MRN [id number]) as of 01/31/2016 17:15  Ref. Range 01/17/2016 12:14 01/21/2016 09:47 01/31/2016 09:43  Sodium Latest Ref Range: 135-145 mmol/L 133 (L) 131 (L) 124 (L)    RADIOGRAPHIC STUDIES: I have  personally reviewed the radiological images as listed and agreed with the findings in the report.  Ct Chest W Contrast  01/03/2016  CLINICAL DATA:  Squamous cell cancer the head and neck. EXAM: CT CHEST, ABDOMEN, AND PELVIS WITH CONTRAST TECHNIQUE: Multidetector CT imaging of the chest, abdomen and pelvis was performed following the standard protocol during bolus administration of intravenous contrast. CONTRAST:  162m OMNIPAQUE IOHEXOL 300 MG/ML  SOLN COMPARISON:  None. FINDINGS: CT CHEST FINDINGS Mediastinum/Lymph Nodes: There is no axillary lymphadenopathy. There is no hilar lymphadenopathy. No mediastinal lymphadenopathy. Heart size is normal The esophagus has normal imaging features. Lungs/Pleura: Centrilobular emphysema noted in the lung apices. 9 mm ground-glass nodule posterior right lung apex likely related to scarring. Musculoskeletal: 6.4 x 8.1 cm right cervical mass noted in the lower neck. Bone windows reveal no worrisome lytic or sclerotic osseous lesions. CT ABDOMEN PELVIS FINDINGS Hepatobiliary: No focal abnormality within the liver parenchyma. Numerous gallstones measure up to 5 mm diameter. Common bile duct is 9 mm in diameter at the level of the pancreatic head. Pancreas: No focal mass lesion. No dilatation of the main duct. No intraparenchymal cyst. No peripancreatic edema. Spleen: No splenomegaly. No focal mass lesion. Adrenals/Urinary Tract: No adrenal nodule or mass. Adrenal thickening is noted bilaterally. Kidneys are unremarkable. No evidence for hydroureter. The urinary bladder appears normal for the degree of distention. Stomach/Bowel: Stomach is nondistended. No gastric wall thickening. No evidence of outlet obstruction. Duodenum is normally positioned as is the ligament of Treitz. No small bowel wall thickening. No small bowel dilatation. The terminal ileum is normal. The appendix is normal. Diverticular changes are noted in the left colon without evidence of diverticulitis.  Vascular/Lymphatic: There is abdominal aortic atherosclerosis without aneurysm. There is no gastrohepatic or hepatoduodenal ligament lymphadenopathy. No intraperitoneal or retroperitoneal lymphadenopathy. No pelvic sidewall lymphadenopathy. Reproductive: Uterus is surgically absent. There is no adnexal mass. Other: No intraperitoneal free  fluid. Musculoskeletal: Bone windows reveal no worrisome lytic or sclerotic osseous lesions. IMPRESSION: 1. Large right-sided neck mass. 2. No evidence for lymphadenopathy or metastatic disease in the chest, abdomen, or pelvis. 3. 9 mm ground-glass nodule in the posterior right lung apex is probably related to scarring given the underlying emphysema. Attention on follow-up is recommended. 4. Cholelithiasis. 5. Abdominal aortic atherosclerosis. Electronically Signed   By: Misty Stanley M.D.   On: 01/03/2016 08:45   Nm Bone Scan Whole Body  01/04/2016  CLINICAL DATA:  Head neck cancer. EXAM: NUCLEAR MEDICINE WHOLE BODY BONE SCAN TECHNIQUE: Whole body anterior and posterior images were obtained approximately 3 hours after intravenous injection of radiopharmaceutical. RADIOPHARMACEUTICALS:  CTs 01/03/2016 . MCi Technetium-29mMDP IV COMPARISON:  None. FINDINGS: Bilateral renal function and excretion. No acute bony abnormality identified. Increased activity noted over the right foot, clinical correlation suggested. These changes could be degenerative. No evidence of metastatic disease. IMPRESSION: 1. No evidence of metastatic disease. 2. Increased activity noted over the right foot, clinical correlation suggested. These changes may be degenerative . Electronically Signed   By: TMarcello Moores Register   On: 01/04/2016 12:16   Ct Abdomen Pelvis W Contrast  01/03/2016  CLINICAL DATA:  Squamous cell cancer the head and neck. EXAM: CT CHEST, ABDOMEN, AND PELVIS WITH CONTRAST TECHNIQUE: Multidetector CT imaging of the chest, abdomen and pelvis was performed following the standard protocol  during bolus administration of intravenous contrast. CONTRAST:  1046mOMNIPAQUE IOHEXOL 300 MG/ML  SOLN COMPARISON:  None. FINDINGS: CT CHEST FINDINGS Mediastinum/Lymph Nodes: There is no axillary lymphadenopathy. There is no hilar lymphadenopathy. No mediastinal lymphadenopathy. Heart size is normal The esophagus has normal imaging features. Lungs/Pleura: Centrilobular emphysema noted in the lung apices. 9 mm ground-glass nodule posterior right lung apex likely related to scarring. Musculoskeletal: 6.4 x 8.1 cm right cervical mass noted in the lower neck. Bone windows reveal no worrisome lytic or sclerotic osseous lesions. CT ABDOMEN PELVIS FINDINGS Hepatobiliary: No focal abnormality within the liver parenchyma. Numerous gallstones measure up to 5 mm diameter. Common bile duct is 9 mm in diameter at the level of the pancreatic head. Pancreas: No focal mass lesion. No dilatation of the main duct. No intraparenchymal cyst. No peripancreatic edema. Spleen: No splenomegaly. No focal mass lesion. Adrenals/Urinary Tract: No adrenal nodule or mass. Adrenal thickening is noted bilaterally. Kidneys are unremarkable. No evidence for hydroureter. The urinary bladder appears normal for the degree of distention. Stomach/Bowel: Stomach is nondistended. No gastric wall thickening. No evidence of outlet obstruction. Duodenum is normally positioned as is the ligament of Treitz. No small bowel wall thickening. No small bowel dilatation. The terminal ileum is normal. The appendix is normal. Diverticular changes are noted in the left colon without evidence of diverticulitis. Vascular/Lymphatic: There is abdominal aortic atherosclerosis without aneurysm. There is no gastrohepatic or hepatoduodenal ligament lymphadenopathy. No intraperitoneal or retroperitoneal lymphadenopathy. No pelvic sidewall lymphadenopathy. Reproductive: Uterus is surgically absent. There is no adnexal mass. Other: No intraperitoneal free fluid. Musculoskeletal:  Bone windows reveal no worrisome lytic or sclerotic osseous lesions. IMPRESSION: 1. Large right-sided neck mass. 2. No evidence for lymphadenopathy or metastatic disease in the chest, abdomen, or pelvis. 3. 9 mm ground-glass nodule in the posterior right lung apex is probably related to scarring given the underlying emphysema. Attention on follow-up is recommended. 4. Cholelithiasis. 5. Abdominal aortic atherosclerosis. Electronically Signed   By: ErMisty Stanley.D.   On: 01/03/2016 08:45   Ir Gastrostomy Tube  01/17/2016  INDICATION: Head  neck squamous cell carcinoma EXAM: TWENTY FRENCH PULL-THROUGH GASTROSTOMY Date:  3/13/20173/13/2017 3:40 pm Radiologist:  M. Daryll Brod, MD Guidance:  Fluoroscopic MEDICATIONS: 2 g Ancef; Antibiotics were administered within 1 hour of the procedure. Glucagon 1 mg IV ANESTHESIA/SEDATION: Versed 2.5 mg IV; Fentanyl 125 mcg IV Moderate Sedation Time:  32 The patient was continuously monitored during the procedure by the interventional radiology nurse under my direct supervision. CONTRAST:  12 cc Omnipaque 300 - administered into the gastric lumen. FLUOROSCOPY TIME:  Fluoroscopy Time: 5 minutes 48 seconds (48 mGy). COMPLICATIONS: None immediate. PROCEDURE: Informed consent was obtained from the patient following explanation of the procedure, risks, benefits and alternatives. The patient understands, agrees and consents for the procedure. All questions were addressed. A time out was performed. Maximal barrier sterile technique utilized including caps, mask, sterile gowns, sterile gloves, large sterile drape, hand hygiene, and betadine prep. The left upper quadrant was sterilely prepped and draped. An oral gastric catheter was inserted into the stomach under fluoroscopy. The existing nasogastric feeding tube was removed. Air was injected into the stomach for insufflation and visualization under fluoroscopy. The air distended stomach was confirmed beneath the anterior abdominal wall  in the frontal and lateral projections. Under sterile conditions and local anesthesia, a 35 gauge trocar needle was utilized to access the stomach percutaneously beneath the left subcostal margin. Needle position was confirmed within the stomach under biplane fluoroscopy. Contrast injection confirmed position also. A single T tack was deployed for gastropexy. Over an Amplatz guide wire, a 9-French sheath was inserted into the stomach. A snare device was utilized to capture the oral gastric catheter. The snare device was pulled retrograde from the stomach up the esophagus and out the oropharynx. The 20-French pull-through gastrostomy was connected to the snare device and pulled antegrade through the oropharynx down the esophagus into the stomach and then through the percutaneous tract external to the patient. The gastrostomy was assembled externally. Contrast injection confirms position in the stomach. Images were obtained for documentation. The patient tolerated procedure well. No immediate complication. IMPRESSION: Fluoroscopic insertion of a 20-French "pull-through" gastrostomy. Electronically Signed   By: Jerilynn Mages.  Shick M.D.   On: 01/17/2016 15:56   Nm Pet Image Initial (pi) Skull Base To Thigh  01/19/2016  CLINICAL DATA:  Initial treatment strategy for squamous cell carcinoma of the right neck. EXAM: NUCLEAR MEDICINE PET SKULL BASE TO THIGH TECHNIQUE: 7.61 mCi F-18 FDG was injected intravenously. Full-ring PET imaging was performed from the skull base to thigh after the radiotracer. CT data was obtained and used for attenuation correction and anatomic localization. FASTING BLOOD GLUCOSE:  Value: 98 mg/dl COMPARISON:  CTs of the neck, 12/17/2015, and chest, abdomen and pelvis, 01/02/2015. Bone scan 01/03/2015. FINDINGS: NECK The dominant right neck mass is markedly hypermetabolic with an SUV max of 20.7. This mass measures approximately 7.0 x 6.8 cm on image 36. There are multiple additional hypermetabolic cervical  lymph nodes bilaterally. These include a right level 4 node measuring 14 mm on image 43 (SUV max 9.9), and small left-sided level 2 (10 mm on image 28) and level 4 nodes on image 41 (measuring 7 and 10 mm, SUV max 11.1). The dominant right neck mass appears contiguous with the right lobe of the thyroid and maybe invading it.No lesions of the pharyngeal mucosal space are identified. There is stable mass effect on the larynx which is displaced to the left. CHEST There are no hypermetabolic mediastinal, hilar or axillary lymph nodes. There is no suspicious  pulmonary activity. Emphysema is noted. The small ground-glass nodule previously noted in the right upper lobe measures 6 mm on image number 6, too small to evaluate by PET-CT. ABDOMEN/PELVIS There is no hypermetabolic activity within the liver, adrenal glands, spleen or pancreas. There is no hypermetabolic nodal activity. There is physiologic activity within the left anterior abdominal wall surrounding a percutaneous G-tube. There is a small amount of extraluminal air within the left upper quadrant of the abdomen and in the anterior abdominal wall, attributed to this G-tube which was placed 2 days ago. Cholelithiasis, mild aortoiliac atherosclerosis and sigmoid diverticulosis are noted. SKELETON There is no hypermetabolic activity to suggest osseous metastatic disease. IMPRESSION: 1. The dominant right neck mass is hypermetabolic. In addition, there are hypermetabolic smaller cervical lymph nodes bilaterally. 2. No discrete lesion of the pharyngeal mucosal space identified. The dominant right neck mass and its hypermetabolic activity are contiguous with the right thyroid lobe and may be invading it. 3. No suspicious activity within the chest, abdomen or pelvis. 4. Small ground-glass nodule in the right upper lobe is too small to evaluate by PET-CT. Attention on follow-up recommended. 5. Small amount of extraluminal air in the upper abdomen attributed to recent  G-tube placement. Electronically Signed   By: Richardean Sale M.D.   On: 01/19/2016 13:01   Ir Fluoro Guide Cv Line Left  01/17/2016  CLINICAL DATA:  Head and neck squamous cell carcinoma EXAM: LEFT INTERNAL JUGULAR SINGLE LUMEN POWER PORT CATHETER INSERTION Date:  3/13/20173/13/2017 2:57 pm Radiologist:  M. Daryll Brod, MD Guidance:  Ultrasound and fluoroscopic MEDICATIONS: 2 g Ancef; The antibiotic was administered within an appropriate time interval prior to skin puncture. ANESTHESIA/SEDATION: Versed 1.5 mg IV; Fentanyl 75 mcg IV; Moderate Sedation Time:  30 The patient was continuously monitored during the procedure by the interventional radiology nurse under my direct supervision. FLUOROSCOPY TIME:  48 seconds (6 mGy) COMPLICATIONS: None immediate. CONTRAST:  None. PROCEDURE: Informed consent was obtained from the patient following explanation of the procedure, risks, benefits and alternatives. The patient understands, agrees and consents for the procedure. All questions were addressed. A time out was performed. Maximal barrier sterile technique utilized including caps, mask, sterile gowns, sterile gloves, large sterile drape, hand hygiene, and 2% chlorhexidine scrub. Under sterile conditions and local anesthesia, left internal jugular micropuncture venous access was performed. Access was performed with ultrasound. Images were obtained for documentation. A guide wire was inserted followed by a transitional dilator. This allowed insertion of a guide wire and catheter into the IVC. Measurements were obtained from the SVC / RA junction back to the left IJ venotomy site. In the left infraclavicular chest, a subcutaneous pocket was created over the second anterior rib. This was done under sterile conditions and local anesthesia. 1% lidocaine with epinephrine was utilized for this. A 2.5 cm incision was made in the skin. Blunt dissection was performed to create a subcutaneous pocket over the right pectoralis  major muscle. The pocket was flushed with saline vigorously. There was adequate hemostasis. The port catheter was assembled and checked for leakage. The port catheter was secured in the pocket with two retention sutures. The tubing was tunneled subcutaneously to the left venotomy site and inserted into the SVC/RA junction through a valved peel-away sheath. Position was confirmed with fluoroscopy. Images were obtained for documentation. The patient tolerated the procedure well. No immediate complications. Incisions were closed in a two layer fashion with 4 - 0 Vicryl suture. Dermabond was applied to the skin. The  port catheter was accessed, blood was aspirated followed by saline and heparin flushes. Needle was removed. A dry sterile dressing was applied. IMPRESSION: Ultrasound and fluoroscopically guided left internal jugular single lumen power port catheter insertion. Tip in the SVC/RA junction. Catheter ready for use. Electronically Signed   By: Jerilynn Mages.  Shick M.D.   On: 01/17/2016 15:58   Ir US Guide Vasc Access Left  01/17/2016  CLINICAL DATA:  Head and neck squamous cell carcinoma EXAM: LEFT INTERNAL JUGULAR SINGLE LUMEN POWER PORT CATHETER INSERTION Date:  3/13/20173/13/2017 2:57 pm Radiologist:  M. Daryll Brod, MD Guidance:  Ultrasound and fluoroscopic MEDICATIONS: 2 g Ancef; The antibiotic was administered within an appropriate time interval prior to skin puncture. ANESTHESIA/SEDATION: Versed 1.5 mg IV; Fentanyl 75 mcg IV; Moderate Sedation Time:  30 The patient was continuously monitored during the procedure by the interventional radiology nurse under my direct supervision. FLUOROSCOPY TIME:  48 seconds (6 mGy) COMPLICATIONS: None immediate. CONTRAST:  None. PROCEDURE: Informed consent was obtained from the patient following explanation of the procedure, risks, benefits and alternatives. The patient understands, agrees and consents for the procedure. All questions were addressed. A time out was performed.  Maximal barrier sterile technique utilized including caps, mask, sterile gowns, sterile gloves, large sterile drape, hand hygiene, and 2% chlorhexidine scrub. Under sterile conditions and local anesthesia, left internal jugular micropuncture venous access was performed. Access was performed with ultrasound. Images were obtained for documentation. A guide wire was inserted followed by a transitional dilator. This allowed insertion of a guide wire and catheter into the IVC. Measurements were obtained from the SVC / RA junction back to the left IJ venotomy site. In the left infraclavicular chest, a subcutaneous pocket was created over the second anterior rib. This was done under sterile conditions and local anesthesia. 1% lidocaine with epinephrine was utilized for this. A 2.5 cm incision was made in the skin. Blunt dissection was performed to create a subcutaneous pocket over the right pectoralis major muscle. The pocket was flushed with saline vigorously. There was adequate hemostasis. The port catheter was assembled and checked for leakage. The port catheter was secured in the pocket with two retention sutures. The tubing was tunneled subcutaneously to the left venotomy site and inserted into the SVC/RA junction through a valved peel-away sheath. Position was confirmed with fluoroscopy. Images were obtained for documentation. The patient tolerated the procedure well. No immediate complications. Incisions were closed in a two layer fashion with 4 - 0 Vicryl suture. Dermabond was applied to the skin. The port catheter was accessed, blood was aspirated followed by saline and heparin flushes. Needle was removed. A dry sterile dressing was applied. IMPRESSION: Ultrasound and fluoroscopically guided left internal jugular single lumen power port catheter insertion. Tip in the SVC/RA junction. Catheter ready for use. Electronically Signed   By: Jerilynn Mages.  Shick M.D.   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 Benjamine Mola 12/21/2015 No obvious primary lesion noted on panendoscopy examination CT neck 12/17/2015 large infiltrative tumor mass extends to skin surface, infiltrates R patorid, R submandibular, R carotid sheath,Thrombosis of R IJ vein, multiple satellite nodules Centrilobular emphysema P16 negative Tobacco Use Tobacco Abuse Port a cath/FT placement on 01/17/2016 Multiple dental extractions with Dr. Enrique Sack 01/13/3016 Hyponatremia   The patient is here for Cycle #1 Cisplatin today.    I personally reviewed and  went over laboratory studies with the patient at length, including her low sodium levels. We will recheck her sodium levels on Wednesday, if still low or lower will consider discontinuing her HCTZ and changing to another antihypertensive.   I have written the patient a prescription for oxycodone for her worsening right neck pain. I advised her that if she has difficulty swallowing the medication to please let us know.   She will return for follow up next week.   Orders Placed This Encounter  Procedures  . Comprehensive metabolic panel    Standing Status: Future     Number of Occurrences:      Standing Expiration Date: 01/30/2017    All questions were answered. The patient knows to call the clinic with any problems, questions or concerns.  This document serves as a record of services personally performed by Ancil Linsey, MD. It was created on her behalf by Arlyce Harman, a trained medical scribe. The creation of this record is based on the scribe's personal observations and the provider's statements to them. This document has been checked and approved by the attending provider.  I have reviewed the above documentation for accuracy and completeness, and I agree with the above.  This note was electronically signed.   Molli Hazard, MD  01/31/2016 11:54 AM

## 2016-01-31 NOTE — Patient Instructions (Addendum)
[place] at [facility] Discharge Instructions  RECOMMENDATIONS MADE BY THE CONSULTANT AND ANY TEST RESULTS WILL BE SENT TO YOUR REFERRING PHYSICIAN.   Exam and discussion by Dr Whitney Muse today Your sodium was low today, we are going to re-check your labs tomorrow  Percocet 10/325 given, you can take 1 every 2-4 hours for pain  Return to see the doctor as scheduled, next Monday for follow up after your chemotherapy, and to have fluids  Please call the clinic if you have any questions or concerns    Thank you for choosing [place] at [facility] to provide your oncology and hematology care.  To afford each patient quality time with our provider, please arrive at least 15 minutes before your scheduled appointment time.   Beginning January 23rd 2017 lab work for the [name] will be done in the  Main lab at [facility] on 1st floor. If you have a lab appointment with the [facility] please come in thru the  Main Entrance and check in at the main information desk  You need to re-schedule your appointment should you arrive 10 or more minutes late.  We strive to give you quality time with our providers, and arriving late affects you and other patients whose appointments are after yours.  Also, if you no show three or more times for appointments you may be dismissed from the clinic at the providers discretion.     Again, thank you for choosing [facility].  Our hope is that these requests will decrease the amount of time that you wait before being seen by our physicians.       _____________________________________________________________  Should you have questions after your visit to [facility], please contact our office at [phone] between the hours of 8:30 a.m. and 4:30 p.m.  Voicemails left after 4:30 p.m. will not be returned until the following business day.  For prescription refill requests, have your  pharmacy contact our office.         Resources For Cancer Patients and their Caregivers ? American Cancer Society: Can assist with transportation, wigs, general needs, runs Look Good Feel Better.        ([phone] ? Cancer Care: Provides financial assistance, online support groups, medication/co-pay assistance.  1-800-813-HOPE 505-832-9427) ? [place] Assists [place] cancer patients and their families through emotional , educational and financial support.  (930) 539-1980 ? [place] DSS Where to apply for food stamps, Medicaid and utility assistance. (601)028-7218 ? RCATS: Transportation to medical appointments. 332-693-8347 ? Social Security Administration: May apply for disability if have a Stage IV cancer. [phone])456-3977 (402)708-0376 ? [employer], Disability and Transit Services: Assists with nutrition, care and transit needs. [phone]

## 2016-01-31 NOTE — Patient Instructions (Signed)
[facility] Discharge Instructions for Patients Receiving Chemotherapy   Beginning January 23rd 2017 lab work for the [facility] will be done in the  Main lab at [facility] on 1st floor. If you have a lab appointment with the [facility] please come in thru the  Main Entrance and check in at the main information desk   Today you received the following chemotherapy agents Cisplatin  To help prevent nausea and vomiting after your treatment, we encourage you to take your nausea medication at the first sign of nausea and repeat as directed If you develop nausea and vomiting, or diarrhea that is not controlled by your medication, call the clinic.  The clinic phone number is [phone]. Office hours are Monday-Friday 8:30am-5:00pm.  BELOW ARE SYMPTOMS THAT SHOULD BE REPORTED IMMEDIATELY:  *FEVER GREATER THAN 101.0 F  *CHILLS WITH OR WITHOUT FEVER  NAUSEA AND VOMITING THAT IS NOT CONTROLLED WITH YOUR NAUSEA MEDICATION  *UNUSUAL SHORTNESS OF BREATH  *UNUSUAL BRUISING OR BLEEDING  TENDERNESS IN MOUTH AND THROAT WITH OR WITHOUT PRESENCE OF ULCERS  *URINARY PROBLEMS  *BOWEL PROBLEMS  UNUSUAL RASH Items with * indicate a potential emergency and should be followed up as soon as possible. If you have an emergency after office hours please contact your primary care physician or go to the nearest emergency department.  Please call the clinic during office hours if you have any questions or concerns.   You may also contact the Patient Navigator at [phone] should you have any questions or need assistance in obtaining follow up care.      Resources For Cancer Patients and their Caregivers ? American Cancer Society: Can assist with transportation, wigs, general needs, runs Look Good Feel Better.        (651)724-8818 ? Cancer Care: Provides financial assistance, online support groups, medication/co-pay assistance.  1-800-813-HOPE 2492706836) ? [place] Assists [place] cancer patients and their families through emotional , educational and financial support.  701-273-2070 ? [place] DSS Where to apply for food stamps, Medicaid and utility assistance. 317-204-3653 ? RCATS: Transportation to medical appointments. 252-474-8738 ? Social Security Administration: May apply for disability if have a Stage IV cancer. [phone] 669-392-4879 ? [employer], Disability and Transit Services: Assists with nutrition, care and transit needs. [phone]

## 2016-01-31 NOTE — Progress Notes (Signed)
Gina Frederick tolerated chemo well.Marland KitchenMarland KitchenMaryan Frederick also for ONPRO neulasta on body injector. See MAR for administration details. Injector in place and engaged with green light indicator on flashing. Tolerated application with out problems.

## 2016-02-01 ENCOUNTER — Encounter (HOSPITAL_BASED_OUTPATIENT_CLINIC_OR_DEPARTMENT_OTHER): Payer: Medicaid Other

## 2016-02-01 ENCOUNTER — Encounter (HOSPITAL_COMMUNITY): Payer: Medicaid Other

## 2016-02-01 VITALS — BP 139/74 | HR 90 | Temp 98.1°F | Resp 16

## 2016-02-01 DIAGNOSIS — C539 Malignant neoplasm of cervix uteri, unspecified: Secondary | ICD-10-CM | POA: Diagnosis not present

## 2016-02-01 DIAGNOSIS — C760 Malignant neoplasm of head, face and neck: Secondary | ICD-10-CM

## 2016-02-01 DIAGNOSIS — E871 Hypo-osmolality and hyponatremia: Secondary | ICD-10-CM

## 2016-02-01 LAB — COMPREHENSIVE METABOLIC PANEL
ALK PHOS: 98 U/L (ref 38–126)
ALT: 24 U/L (ref 14–54)
AST: 34 U/L (ref 15–41)
Albumin: 3.7 g/dL (ref 3.5–5.0)
Anion gap: 12 (ref 5–15)
BUN: 17 mg/dL (ref 6–20)
CALCIUM: 9.4 mg/dL (ref 8.9–10.3)
CO2: 26 mmol/L (ref 22–32)
CREATININE: 0.76 mg/dL (ref 0.44–1.00)
Chloride: 90 mmol/L — ABNORMAL LOW (ref 101–111)
Glucose, Bld: 140 mg/dL — ABNORMAL HIGH (ref 65–99)
Potassium: 4.4 mmol/L (ref 3.5–5.1)
Sodium: 128 mmol/L — ABNORMAL LOW (ref 135–145)
Total Bilirubin: 0.3 mg/dL (ref 0.3–1.2)
Total Protein: 7.4 g/dL (ref 6.5–8.1)

## 2016-02-01 MED ORDER — SODIUM CHLORIDE 0.9 % IV SOLN
INTRAVENOUS | Status: DC
  Administered 2016-02-01: 14:00:00 via INTRAVENOUS

## 2016-02-01 MED ORDER — LORAZEPAM 2 MG/ML IJ SOLN
0.5000 mg | Freq: Once | INTRAMUSCULAR | Status: AC
  Administered 2016-02-01: 0.5 mg via INTRAVENOUS

## 2016-02-01 MED ORDER — HEPARIN SOD (PORK) LOCK FLUSH 100 UNIT/ML IV SOLN
500.0000 [IU] | Freq: Once | INTRAVENOUS | Status: AC
  Administered 2016-02-01: 500 [IU] via INTRAVENOUS

## 2016-02-01 MED ORDER — HEPARIN SOD (PORK) LOCK FLUSH 100 UNIT/ML IV SOLN
INTRAVENOUS | Status: AC
  Filled 2016-02-01: qty 5

## 2016-02-01 MED ORDER — LORAZEPAM 2 MG/ML IJ SOLN
INTRAMUSCULAR | Status: AC
  Filled 2016-02-01: qty 1

## 2016-02-01 NOTE — Progress Notes (Signed)
Tolerated IV fluids well. Denies any nausea at present. No other complaints. Ambulatory on discharge home with friend.

## 2016-02-01 NOTE — Patient Instructions (Signed)
[place] at [facility] Discharge Instructions  RECOMMENDATIONS MADE BY THE CONSULTANT AND ANY TEST RESULTS WILL BE SENT TO YOUR REFERRING PHYSICIAN.  IV fluids and IV ativan given as ordered. Return as scheduled.  Thank you for choosing [place] at [facility] to provide your oncology and hematology care.  To afford each patient quality time with our provider, please arrive at least 15 minutes before your scheduled appointment time.   Beginning January 23rd 2017 lab work for the [name] will be done in the  Main lab at [facility] on 1st floor. If you have a lab appointment with the [facility] please come in thru the  Main Entrance and check in at the main information desk  You need to re-schedule your appointment should you arrive 10 or more minutes late.  We strive to give you quality time with our providers, and arriving late affects you and other patients whose appointments are after yours.  Also, if you no show three or more times for appointments you may be dismissed from the clinic at the providers discretion.     Again, thank you for choosing [facility].  Our hope is that these requests will decrease the amount of time that you wait before being seen by our physicians.       _____________________________________________________________  Should you have questions after your visit to [facility], please contact our office at [phone] between the hours of 8:30 a.m. and 4:30 p.m.  Voicemails left after 4:30 p.m. will not be returned until the following business day.  For prescription refill requests, have your pharmacy contact our office.         Resources For Cancer Patients and their Caregivers ? American Cancer Society: Can assist with transportation, wigs, general needs, runs Look Good Feel Better.        612-027-0130 ? Cancer Care: Provides financial assistance, online support groups,  medication/co-pay assistance.  1-800-813-HOPE (620)155-6502) ? [place] Assists [place] cancer patients and their families through emotional , educational and financial support.  [phone] ? [place] DSS Where to apply for food stamps, Medicaid and utility assistance. (306) 340-7434 ? RCATS: Transportation to medical appointments. (239)635-9022 ? Social Security Administration: May apply for disability if have a Stage IV cancer. 306-207-6236 [phone] 340-1269 ? [employer], Disability and Transit Services: Assists with nutrition, care and transit needs. [phone]

## 2016-02-02 ENCOUNTER — Ambulatory Visit (HOSPITAL_COMMUNITY): Payer: Self-pay

## 2016-02-03 ENCOUNTER — Ambulatory Visit (HOSPITAL_COMMUNITY): Payer: Medicaid Other | Attending: Hematology & Oncology | Admitting: Speech Pathology

## 2016-02-03 DIAGNOSIS — R1312 Dysphagia, oropharyngeal phase: Secondary | ICD-10-CM | POA: Insufficient documentation

## 2016-02-03 NOTE — Therapy (Signed)
[place], [place], [address] Phone: [phone]   Fax:  [phone]  Patient Details  Name: Gina Frederick MRN: [id number] Date of Birth: 1956-07-15 Referring Provider:  Patrici Ranks, MD  Encounter Date: 02/03/2016  [name] was scheduled for SLP evaluation this date as part of head and neck cancer treatment, however pt was too nauseous to stay for the evaluation. She was only able to give me a brief history and sip on some water. Plan to have pt return for full evaluation when she is feeling better. Pt noted to have significant cervical adenopathy today which she says is new over the past couple days.   Thank you,  Genene Churn, [place]   [facility] 02/03/2016, 6:00 PM  [place], [place], [address] Phone: [phone]   Fax:  [phone]

## 2016-02-04 ENCOUNTER — Encounter (HOSPITAL_BASED_OUTPATIENT_CLINIC_OR_DEPARTMENT_OTHER): Payer: Medicaid Other

## 2016-02-04 VITALS — BP 158/83 | HR 84 | Temp 98.4°F | Resp 20

## 2016-02-04 DIAGNOSIS — C760 Malignant neoplasm of head, face and neck: Secondary | ICD-10-CM | POA: Diagnosis not present

## 2016-02-04 DIAGNOSIS — Z95828 Presence of other vascular implants and grafts: Secondary | ICD-10-CM

## 2016-02-04 MED ORDER — HEPARIN SOD (PORK) LOCK FLUSH 100 UNIT/ML IV SOLN
500.0000 [IU] | Freq: Once | INTRAVENOUS | Status: AC
  Administered 2016-02-04: 500 [IU] via INTRAVENOUS

## 2016-02-04 MED ORDER — LORAZEPAM 2 MG/ML IJ SOLN
0.5000 mg | Freq: Once | INTRAMUSCULAR | Status: AC
  Administered 2016-02-04: 0.5 mg via INTRAVENOUS

## 2016-02-04 MED ORDER — HEPARIN SOD (PORK) LOCK FLUSH 100 UNIT/ML IV SOLN
INTRAVENOUS | Status: AC
  Filled 2016-02-04: qty 5

## 2016-02-04 MED ORDER — SODIUM CHLORIDE 0.9 % IV SOLN
INTRAVENOUS | Status: DC
  Administered 2016-02-04: 10:00:00 via INTRAVENOUS

## 2016-02-04 MED ORDER — LORAZEPAM 2 MG/ML IJ SOLN
INTRAMUSCULAR | Status: AC
  Filled 2016-02-04: qty 1

## 2016-02-04 NOTE — Progress Notes (Signed)
Patient tolerated IV fluids well. Denies any nausea on discharge. Stable on discharge home with friend via wheelchair.

## 2016-02-04 NOTE — Patient Instructions (Signed)
[place] at [facility] Discharge Instructions  RECOMMENDATIONS MADE BY THE CONSULTANT AND ANY TEST RESULTS WILL BE SENT TO YOUR REFERRING PHYSICIAN. IV fluids and ativan given as ordered.  Thank you for choosing [place] at [facility] to provide your oncology and hematology care.  To afford each patient quality time with our provider, please arrive at least 15 minutes before your scheduled appointment time.   Beginning January 23rd 2017 lab work for the [name] will be done in the  Main lab at [facility] on 1st floor. If you have a lab appointment with the [facility] please come in thru the  Main Entrance and check in at the main information desk  You need to re-schedule your appointment should you arrive 10 or more minutes late.  We strive to give you quality time with our providers, and arriving late affects you and other patients whose appointments are after yours.  Also, if you no show three or more times for appointments you may be dismissed from the clinic at the providers discretion.     Again, thank you for choosing [facility].  Our hope is that these requests will decrease the amount of time that you wait before being seen by our physicians.       _____________________________________________________________  Should you have questions after your visit to [facility], please contact our office at [phone] between the hours of 8:30 a.m. and 4:30 p.m.  Voicemails left after 4:30 p.m. will not be returned until the following business day.  For prescription refill requests, have your pharmacy contact our office.    Return as scheduled.  Resources For Cancer Patients and their Caregivers ? American Cancer Society: Can assist with transportation, wigs, general needs, runs Look Good Feel Better.        646-681-0524 ? Cancer Care: Provides financial assistance, online support groups,  medication/co-pay assistance.  1-800-813-HOPE (442) 223-7041) ? [place] Assists [place] cancer patients and their families through emotional , educational and financial support.  (385) 818-8941 ? [place] DSS Where to apply for food stamps, Medicaid and utility assistance. 830-413-3905 ? RCATS: Transportation to medical appointments. 706-081-0920 ? Social Security Administration: May apply for disability if have a Stage IV cancer. 918-270-3441 276-570-2057 ? [employer], Disability and Transit Services: Assists with nutrition, care and transit needs. [phone]

## 2016-02-07 ENCOUNTER — Inpatient Hospital Stay (HOSPITAL_COMMUNITY): Payer: Self-pay

## 2016-02-07 ENCOUNTER — Encounter (HOSPITAL_COMMUNITY): Payer: Medicaid Other | Attending: Hematology & Oncology

## 2016-02-07 ENCOUNTER — Encounter (HOSPITAL_COMMUNITY): Payer: Medicaid Other | Attending: Hematology & Oncology | Admitting: Hematology & Oncology

## 2016-02-07 ENCOUNTER — Encounter (HOSPITAL_COMMUNITY): Payer: Self-pay | Admitting: Hematology & Oncology

## 2016-02-07 ENCOUNTER — Telehealth (HOSPITAL_COMMUNITY): Payer: Self-pay | Admitting: Emergency Medicine

## 2016-02-07 VITALS — Wt 148.0 lb

## 2016-02-07 DIAGNOSIS — D649 Anemia, unspecified: Secondary | ICD-10-CM | POA: Insufficient documentation

## 2016-02-07 DIAGNOSIS — G4701 Insomnia due to medical condition: Secondary | ICD-10-CM | POA: Insufficient documentation

## 2016-02-07 DIAGNOSIS — C760 Malignant neoplasm of head, face and neck: Secondary | ICD-10-CM

## 2016-02-07 DIAGNOSIS — G893 Neoplasm related pain (acute) (chronic): Secondary | ICD-10-CM | POA: Diagnosis not present

## 2016-02-07 LAB — CBC WITH DIFFERENTIAL/PLATELET
BASOS ABS: 0 10*3/uL (ref 0.0–0.1)
BASOS PCT: 0 %
EOS ABS: 0 10*3/uL (ref 0.0–0.7)
EOS PCT: 1 %
HCT: 21.9 % — ABNORMAL LOW (ref 36.0–46.0)
Hemoglobin: 7.9 g/dL — ABNORMAL LOW (ref 12.0–15.0)
LYMPHS PCT: 7 %
Lymphs Abs: 0.3 10*3/uL — ABNORMAL LOW (ref 0.7–4.0)
MCH: 31.2 pg (ref 26.0–34.0)
MCHC: 36.1 g/dL — ABNORMAL HIGH (ref 30.0–36.0)
MCV: 86.6 fL (ref 78.0–100.0)
MONO ABS: 0.3 10*3/uL (ref 0.1–1.0)
Monocytes Relative: 6 %
Neutro Abs: 3.7 10*3/uL (ref 1.7–7.7)
Neutrophils Relative %: 86 %
PLATELETS: 250 10*3/uL (ref 150–400)
RBC: 2.53 MIL/uL — AB (ref 3.87–5.11)
RDW: 12.1 % (ref 11.5–15.5)
WBC: 4.3 10*3/uL (ref 4.0–10.5)

## 2016-02-07 LAB — COMPREHENSIVE METABOLIC PANEL
ALBUMIN: 2.8 g/dL — AB (ref 3.5–5.0)
ALT: 22 U/L (ref 14–54)
AST: 19 U/L (ref 15–41)
Alkaline Phosphatase: 59 U/L (ref 38–126)
Anion gap: 10 (ref 5–15)
BUN: 26 mg/dL — AB (ref 6–20)
CHLORIDE: 85 mmol/L — AB (ref 101–111)
CO2: 28 mmol/L (ref 22–32)
CREATININE: 1.5 mg/dL — AB (ref 0.44–1.00)
Calcium: 7.1 mg/dL — ABNORMAL LOW (ref 8.9–10.3)
GFR calc Af Amer: 43 mL/min — ABNORMAL LOW (ref >60)
GFR, EST NON AFRICAN AMERICAN: 37 mL/min — AB (ref >60)
Glucose, Bld: 90 mg/dL (ref 65–99)
POTASSIUM: 3.1 mmol/L — AB (ref 3.5–5.1)
SODIUM: 123 mmol/L — AB (ref 135–145)
Total Bilirubin: 0.5 mg/dL (ref 0.3–1.2)
Total Protein: 5.5 g/dL — ABNORMAL LOW (ref 6.5–8.1)

## 2016-02-07 LAB — MAGNESIUM: MAGNESIUM: 1.4 mg/dL — AB (ref 1.7–2.4)

## 2016-02-07 MED ORDER — HEPARIN SOD (PORK) LOCK FLUSH 100 UNIT/ML IV SOLN
500.0000 [IU] | Freq: Once | INTRAVENOUS | Status: AC
  Administered 2016-02-07: 500 [IU] via INTRAVENOUS

## 2016-02-07 MED ORDER — SODIUM CHLORIDE 0.9 % IV SOLN
Freq: Once | INTRAVENOUS | Status: AC
  Administered 2016-02-07: 10:00:00 via INTRAVENOUS

## 2016-02-07 MED ORDER — ZOLPIDEM TARTRATE 10 MG PO TABS: 10.0000 mg | ORAL_TABLET | Freq: Every evening | ORAL | Status: DC | PRN

## 2016-02-07 NOTE — Assessment & Plan Note (Signed)
She has been given a prescription for ambien 10 mg and instructed to take it in bed. I advised her to not take the medication and stay up until she is "sleepy." She notes she understands.  We discussed her mood which she feels is still positive.  If this does not improve sleep she was instructed to call.

## 2016-02-07 NOTE — Progress Notes (Signed)
[place] [facility] Progress Note  Patient Care Team: Noel Journey. 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 Benjamine Mola 12/21/2015 No obvious primary lesion noted on panendoscopy examination CT neck 12/17/2015 large infiltrative tumor mass extends to skin surface, infiltrates R patorid, R submandibular, R carotid sheath,Thrombosis of R IJ vein, multiple satellite nodules Centrilobular emphysema P16 negative Tobacco Abuse Port a cath/FT placement on 01/17/2016 Multiple dental extractions with Dr. Enrique Sack 01/13/3016   HISTORY OF PRESENTING ILLNESS:  Gina Frederick 60 y.o. 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   [name] is here alone and receiving IV fluids during our visit. She is receiving concurrent radiation therapy.   She has been doing okay. Further explaining, she has been able to sleep better as her L facial swelling has decreased. She has previously tried an OTC sleep aid which did not help. She is asking for a prescription sleep aid. She notes that she would like to sleep a few hours at night.   Notes that nausea and vomiting has eased up as her facial swelling has decreased. She denies vomiting after tube feedings as long as she does it slowly. She has been able to take more water in orally as the facial swelling has decreased. She has Boost/Ensure at home. She has also been able to continue to eat soft foods. Weight is fairly stable.   She denies pain since her facial swelling has gone down. Current pain medications are working fine.    MEDICAL HISTORY:  Past Medical  History  Diagnosis Date  . Hypertension   . Arthritis     hands  . Neck mass     right neck(surfaced 11'16,then progressively enlarged"- denies swallwing or breathing difficulty.  . Hyperlipidemia   . Cancer ([place])     dx. squamous cell carcinoma-right neck    SURGICAL HISTORY: Past Surgical History  Procedure Laterality Date  . Tonsillectomy    . Knee arthroscopy      right  . Cesarean section    . Mass biopsy Right 12/21/2015    Procedure: NECK MASS BIOPSY;  Surgeon: Leta Baptist, MD;  Location: [facility];  Service: ENT;  Laterality: Right;  . Panendoscopy N/A 12/21/2015    Procedure: PANENDOSCOPY;  Surgeon: Leta Baptist, MD;  Location: [place];  Service: ENT;  Laterality: N/A;  . Multiple extractions with alveoloplasty N/A 01/13/2016    Procedure: EXTRACTION OF TOOTH #'S 6-12,15, 19-28, 30 WITH ALVEOLOPLASTY AND BILATERAL MAXILLARY TUBEROSITY REDUCTIONS;  Surgeon: Lenn Cal, DDS;  Location: [facility];  Service: Oral Surgery;  Laterality: N/A;    SOCIAL HISTORY: Social History   Social History  . Marital Status: Divorced    Spouse Name: N/A  . Number of Children: 1  . Years of Education: N/A   Occupational History  . Not on file.   Social History Main Topics  . Smoking status: Current Every Day Smoker -- 0.25 packs/day    Types: Cigarettes  . Smokeless tobacco: Never Used     Comment: cut back to 1 cigarette per day  . Alcohol Use: No  . Drug Use: No  . Sexual  Activity: No   Other Topics Concern  . Not on file   Social History Narrative   She is divorced. One daughter, no grandchildren Smokes, notes that she is down to 3 a day. Began smoking "probably in her 39's." She says she has quit "on and off through the years." Denies problems with EtOH. 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 Quit working when her daughter was "of age to go to school," then stayed  home with her daughter. For hobbies, she says she likes to read.  Is currently the caregiver for a lady with diabetes.  She does light housekeeping and keeps her on her medications, and cooks. The son of the woman she cares for, Herbie Baltimore, notes that her good cooking has brought his momma's blood pressure and blood sugar down.  She notes she has a good support system with her sister, and with the son of the woman she takes care of.  FAMILY HISTORY: Family History  Problem Relation Age of Onset  . Alzheimer's disease Mother   . Congestive Heart Failure Father    indicated that her mother is deceased. She indicated that her father is deceased.   Mother and father are deceased. Mother was 32; died of Alzheimer's. Father was 75; congestive heart failure. Has a sister, age 93 this year. Had a brother, deceased. He took his own life. 4 nieces and nephews.  ALLERGIES:  is allergic to codeine.  MEDICATIONS:  Current Outpatient Prescriptions  Medication Sig Dispense Refill  . acetaminophen (TYLENOL) 500 MG tablet Take 500 mg by mouth every 6 (six) hours as needed.    Marland Kitchen CISPLATIN IV Inject into the vein. Reported on 01/21/2016    . diphenhydrAMINE (BENADRYL) 25 MG tablet Take 25 mg by mouth every 6 (six) hours as needed.    . fluconazole (DIFLUCAN) 100 MG tablet Take two tablets now, then one daily until all gone. 8 tablet 0  . lidocaine-prilocaine (EMLA) cream Apply a quarter size amount to port site 1 hour prior to chemo. Do not rub in. Cover with plastic wrap. 30 g 3  . lisinopril-hydrochlorothiazide (PRINZIDE,ZESTORETIC) 20-12.5 MG tablet Take 1 tablet by mouth daily.    Marland Kitchen lovastatin (MEVACOR) 20 MG tablet Take 20 mg by mouth at bedtime. Reported on 01/21/2016    . naproxen sodium (ANAPROX) 220 MG tablet Take 220 mg by mouth 2 (two) times daily as needed (Pain).    . ondansetron (ZOFRAN) 8 MG tablet Take 1 tablet (8 mg total) by mouth every 8 (eight) hours as needed for nausea or vomiting.  30 tablet 2  . oxyCODONE-acetaminophen (PERCOCET) 10-325 MG tablet Take every 2-4 hours as needed for pain 60 tablet 0  . Tetrahydrozoline-Zn Sulfate (EYE DROPS ALLERGY RELIEF OP) Apply 1 drop to eye 2 (two) times daily as needed (Itching eyes).    Marland Kitchen zolpidem (AMBIEN) 10 MG tablet Take 1 tablet (10 mg total) by mouth at bedtime as needed for sleep. 30 tablet 0  . prochlorperazine (COMPAZINE) 10 MG tablet Take 1 tablet (10 mg total) by mouth every 6 (six) hours as needed for nausea or vomiting. (Patient not taking: Reported on 01/21/2016) 30 tablet 2   No current facility-administered medications for this visit.    Review of Systems  Constitutional: Negative for fever, chills and malaise/fatigue.  HENT: Negative for congestion, hearing loss, nosebleeds, sore throat and tinnitus.   Eyes: Negative.  Negative for blurred vision,  double vision, pain and discharge.  Respiratory: Positive for cough. Negative for hemoptysis, sputum production, shortness of breath and wheezing.        "Light cough" since starting her blood pressure medication.  Cardiovascular: Negative.  Negative for chest pain, palpitations, claudication, leg swelling and PND.  Gastrointestinal: Negative.  Negative for heartburn, nausea, vomiting, abdominal pain, diarrhea, constipation, blood in stool and melena.  Genitourinary: Negative.  Negative for dysuria, urgency, frequency and hematuria.  Musculoskeletal: Negative for myalgias, joint pain and falls.  Skin: Negative.  Negative for itching and rash.  Neurological: Negative.  Negative for dizziness, tingling, tremors, sensory change, speech change, focal weakness, seizures, loss of consciousness, weakness and headaches.  Endo/Heme/Allergies: Negative.  Does not bruise/bleed easily.  Psychiatric/Behavioral: Negative for depression, suicidal ideas, memory loss and substance abuse. The patient has insomnia. The patient is not nervous/anxious.   All other systems reviewed and are  negative.  14 point ROS was done and is otherwise as detailed above or in HPI   PHYSICAL EXAMINATION: ECOG PERFORMANCE STATUS: 1 - Symptomatic but completely ambulatory Vitals with BMI 02/07/2016  Height   Weight 148 lbs  BMI   Systolic [id number]  Diastolic 72  Pulse 87  Respirations 16    Physical Exam  Constitutional: She is oriented to person, place, and time and well-developed, well-nourished, and in no distress.  Wears glasses. In treatment chair.  HENT:  Head: Normocephalic and atraumatic.  Nose: Nose normal.  Mouth/Throat: Oropharynx is clear and moist. No oropharyngeal exudate.  Limited as to how far she can open her mouth, due to her mass.  Eyes: Conjunctivae and EOM are normal. Pupils are equal, round, and reactive to light. Right eye exhibits no discharge. Left eye exhibits no discharge. No scleral icterus.  Neck: Normal range of motion. Neck supple. No tracheal deviation present. No thyromegaly present.  Large R sided neck mass that begins just lateral to the thyroid cartilage, approximately 8 cm in greatest dimension, small tumor extension through the skin, well healed surgical incision site noted. Tumor extends to the base of the mandible. Erythema with radiation changes noted.  Cardiovascular: Normal rate, regular rhythm and normal heart sounds.  Exam reveals no gallop and no friction rub.   No murmur heard. Pulmonary/Chest: Effort normal and breath sounds normal. She has no wheezes. She has no rales.  Abdominal: Soft. Bowel sounds are normal. She exhibits no distension and no mass. There is no tenderness. There is no rebound and no guarding.  Musculoskeletal: Normal range of motion. She exhibits no edema.  Lymphadenopathy:    She has no cervical adenopathy.  Neurological: She is alert and oriented to person, place, and time. She has normal reflexes. No cranial nerve deficit. Gait normal. Coordination normal.  Skin: Skin is warm and dry. No rash noted.  Psychiatric: Mood,  memory, affect and judgment normal.  Nursing note and vitals reviewed.   LABORATORY DATA:  I have reviewed the data as listed  Lab Results  Component Value Date   WBC 4.3 02/07/2016   HGB 7.9* 02/07/2016   HCT 21.9* 02/07/2016   MCV 86.6 02/07/2016   PLT 250 02/07/2016   CMP     Component Value Date/Time   NA 123* 02/07/2016 1020   K 3.1* 02/07/2016 1020   CL 85* 02/07/2016 1020   CO2 28 02/07/2016 1020   GLUCOSE 90 02/07/2016 1020   BUN 26* 02/07/2016 1020   CREATININE 1.50* 02/07/2016 1020   CALCIUM 7.1* 02/07/2016 1020  PROT 5.5* 02/07/2016 1020   ALBUMIN 2.8* 02/07/2016 1020   AST 19 02/07/2016 1020   ALT 22 02/07/2016 1020   ALKPHOS 59 02/07/2016 1020   BILITOT 0.5 02/07/2016 1020   GFRNONAA 37* 02/07/2016 1020   GFRAA 43* 02/07/2016 1020   Results for LAURALEI, TROUGHTON (MRN [id number]) as of 02/07/2016 11:05  Ref. Range 01/21/2016 09:46 01/21/2016 09:47 01/31/2016 09:43 02/01/2016 13:25 02/07/2016 10:20  Sodium Latest Ref Range: 135-145 mmol/L  131 (L) 124 (L) 128 (L) 123 (L)     RADIOGRAPHIC STUDIES: I have personally reviewed the radiological images as listed and agreed with the findings in the report.  Ir Gastrostomy Tube  01/17/2016  INDICATION: Head neck squamous cell carcinoma EXAM: TWENTY FRENCH PULL-THROUGH GASTROSTOMY Date:  3/13/20173/13/2017 3:40 pm Radiologist:  M. Daryll Brod, MD Guidance:  Fluoroscopic MEDICATIONS: 2 g Ancef; Antibiotics were administered within 1 hour of the procedure. Glucagon 1 mg IV ANESTHESIA/SEDATION: Versed 2.5 mg IV; Fentanyl 125 mcg IV Moderate Sedation Time:  32 The patient was continuously monitored during the procedure by the interventional radiology nurse under my direct supervision. CONTRAST:  12 cc Omnipaque 300 - administered into the gastric lumen. FLUOROSCOPY TIME:  Fluoroscopy Time: 5 minutes 48 seconds (48 mGy). COMPLICATIONS: None immediate. PROCEDURE: Informed consent was obtained from the patient following explanation of  the procedure, risks, benefits and alternatives. The patient understands, agrees and consents for the procedure. All questions were addressed. A time out was performed. Maximal barrier sterile technique utilized including caps, mask, sterile gowns, sterile gloves, large sterile drape, hand hygiene, and betadine prep. The left upper quadrant was sterilely prepped and draped. An oral gastric catheter was inserted into the stomach under fluoroscopy. The existing nasogastric feeding tube was removed. Air was injected into the stomach for insufflation and visualization under fluoroscopy. The air distended stomach was confirmed beneath the anterior abdominal wall in the frontal and lateral projections. Under sterile conditions and local anesthesia, a 67 gauge trocar needle was utilized to access the stomach percutaneously beneath the left subcostal margin. Needle position was confirmed within the stomach under biplane fluoroscopy. Contrast injection confirmed position also. A single T tack was deployed for gastropexy. Over an Amplatz guide wire, a 9-French sheath was inserted into the stomach. A snare device was utilized to capture the oral gastric catheter. The snare device was pulled retrograde from the stomach up the esophagus and out the oropharynx. The 20-French pull-through gastrostomy was connected to the snare device and pulled antegrade through the oropharynx down the esophagus into the stomach and then through the percutaneous tract external to the patient. The gastrostomy was assembled externally. Contrast injection confirms position in the stomach. Images were obtained for documentation. The patient tolerated procedure well. No immediate complication. IMPRESSION: Fluoroscopic insertion of a 20-French "pull-through" gastrostomy. Electronically Signed   By: Jerilynn Mages.  Shick M.D.   On: 01/17/2016 15:56   Nm Pet Image Initial (pi) Skull Base To Thigh  01/19/2016  CLINICAL DATA:  Initial treatment strategy for squamous  cell carcinoma of the right neck. EXAM: NUCLEAR MEDICINE PET SKULL BASE TO THIGH TECHNIQUE: 7.61 mCi F-18 FDG was injected intravenously. Full-ring PET imaging was performed from the skull base to thigh after the radiotracer. CT data was obtained and used for attenuation correction and anatomic localization. FASTING BLOOD GLUCOSE:  Value: 98 mg/dl COMPARISON:  CTs of the neck, 12/17/2015, and chest, abdomen and pelvis, 01/02/2015. Bone scan 01/03/2015. FINDINGS: NECK The dominant right neck mass is markedly hypermetabolic with an SUV  max of 20.7. This mass measures approximately 7.0 x 6.8 cm on image 36. There are multiple additional hypermetabolic cervical lymph nodes bilaterally. These include a right level 4 node measuring 14 mm on image 43 (SUV max 9.9), and small left-sided level 2 (10 mm on image 28) and level 4 nodes on image 41 (measuring 7 and 10 mm, SUV max 11.1). The dominant right neck mass appears contiguous with the right lobe of the thyroid and maybe invading it.No lesions of the pharyngeal mucosal space are identified. There is stable mass effect on the larynx which is displaced to the left. CHEST There are no hypermetabolic mediastinal, hilar or axillary lymph nodes. There is no suspicious pulmonary activity. Emphysema is noted. The small ground-glass nodule previously noted in the right upper lobe measures 6 mm on image number 6, too small to evaluate by PET-CT. ABDOMEN/PELVIS There is no hypermetabolic activity within the liver, adrenal glands, spleen or pancreas. There is no hypermetabolic nodal activity. There is physiologic activity within the left anterior abdominal wall surrounding a percutaneous G-tube. There is a small amount of extraluminal air within the left upper quadrant of the abdomen and in the anterior abdominal wall, attributed to this G-tube which was placed 2 days ago. Cholelithiasis, mild aortoiliac atherosclerosis and sigmoid diverticulosis are noted. SKELETON There is no  hypermetabolic activity to suggest osseous metastatic disease. IMPRESSION: 1. The dominant right neck mass is hypermetabolic. In addition, there are hypermetabolic smaller cervical lymph nodes bilaterally. 2. No discrete lesion of the pharyngeal mucosal space identified. The dominant right neck mass and its hypermetabolic activity are contiguous with the right thyroid lobe and may be invading it. 3. No suspicious activity within the chest, abdomen or pelvis. 4. Small ground-glass nodule in the right upper lobe is too small to evaluate by PET-CT. Attention on follow-up recommended. 5. Small amount of extraluminal air in the upper abdomen attributed to recent G-tube placement. Electronically Signed   By: Richardean Sale M.D.   On: 01/19/2016 13:01   Ir Fluoro Guide Cv Line Left  01/17/2016  CLINICAL DATA:  Head and neck squamous cell carcinoma EXAM: LEFT INTERNAL JUGULAR SINGLE LUMEN POWER PORT CATHETER INSERTION Date:  3/13/20173/13/2017 2:57 pm Radiologist:  M. Daryll Brod, MD Guidance:  Ultrasound and fluoroscopic MEDICATIONS: 2 g Ancef; The antibiotic was administered within an appropriate time interval prior to skin puncture. ANESTHESIA/SEDATION: Versed 1.5 mg IV; Fentanyl 75 mcg IV; Moderate Sedation Time:  30 The patient was continuously monitored during the procedure by the interventional radiology nurse under my direct supervision. FLUOROSCOPY TIME:  48 seconds (6 mGy) COMPLICATIONS: None immediate. CONTRAST:  None. PROCEDURE: Informed consent was obtained from the patient following explanation of the procedure, risks, benefits and alternatives. The patient understands, agrees and consents for the procedure. All questions were addressed. A time out was performed. Maximal barrier sterile technique utilized including caps, mask, sterile gowns, sterile gloves, large sterile drape, hand hygiene, and 2% chlorhexidine scrub. Under sterile conditions and local anesthesia, left internal jugular micropuncture  venous access was performed. Access was performed with ultrasound. Images were obtained for documentation. A guide wire was inserted followed by a transitional dilator. This allowed insertion of a guide wire and catheter into the IVC. Measurements were obtained from the SVC / RA junction back to the left IJ venotomy site. In the left infraclavicular chest, a subcutaneous pocket was created over the second anterior rib. This was done under sterile conditions and local anesthesia. 1% lidocaine with epinephrine was utilized  for this. A 2.5 cm incision was made in the skin. Blunt dissection was performed to create a subcutaneous pocket over the right pectoralis major muscle. The pocket was flushed with saline vigorously. There was adequate hemostasis. The port catheter was assembled and checked for leakage. The port catheter was secured in the pocket with two retention sutures. The tubing was tunneled subcutaneously to the left venotomy site and inserted into the SVC/RA junction through a valved peel-away sheath. Position was confirmed with fluoroscopy. Images were obtained for documentation. The patient tolerated the procedure well. No immediate complications. Incisions were closed in a two layer fashion with 4 - 0 Vicryl suture. Dermabond was applied to the skin. The port catheter was accessed, blood was aspirated followed by saline and heparin flushes. Needle was removed. A dry sterile dressing was applied. IMPRESSION: Ultrasound and fluoroscopically guided left internal jugular single lumen power port catheter insertion. Tip in the SVC/RA junction. Catheter ready for use. Electronically Signed   By: Jerilynn Mages.  Shick M.D.   On: 01/17/2016 15:58   Ir US Guide Vasc Access Left  01/17/2016  CLINICAL DATA:  Head and neck squamous cell carcinoma EXAM: LEFT INTERNAL JUGULAR SINGLE LUMEN POWER PORT CATHETER INSERTION Date:  3/13/20173/13/2017 2:57 pm Radiologist:  M. Daryll Brod, MD Guidance:  Ultrasound and fluoroscopic  MEDICATIONS: 2 g Ancef; The antibiotic was administered within an appropriate time interval prior to skin puncture. ANESTHESIA/SEDATION: Versed 1.5 mg IV; Fentanyl 75 mcg IV; Moderate Sedation Time:  30 The patient was continuously monitored during the procedure by the interventional radiology nurse under my direct supervision. FLUOROSCOPY TIME:  48 seconds (6 mGy) COMPLICATIONS: None immediate. CONTRAST:  None. PROCEDURE: Informed consent was obtained from the patient following explanation of the procedure, risks, benefits and alternatives. The patient understands, agrees and consents for the procedure. All questions were addressed. A time out was performed. Maximal barrier sterile technique utilized including caps, mask, sterile gowns, sterile gloves, large sterile drape, hand hygiene, and 2% chlorhexidine scrub. Under sterile conditions and local anesthesia, left internal jugular micropuncture venous access was performed. Access was performed with ultrasound. Images were obtained for documentation. A guide wire was inserted followed by a transitional dilator. This allowed insertion of a guide wire and catheter into the IVC. Measurements were obtained from the SVC / RA junction back to the left IJ venotomy site. In the left infraclavicular chest, a subcutaneous pocket was created over the second anterior rib. This was done under sterile conditions and local anesthesia. 1% lidocaine with epinephrine was utilized for this. A 2.5 cm incision was made in the skin. Blunt dissection was performed to create a subcutaneous pocket over the right pectoralis major muscle. The pocket was flushed with saline vigorously. There was adequate hemostasis. The port catheter was assembled and checked for leakage. The port catheter was secured in the pocket with two retention sutures. The tubing was tunneled subcutaneously to the left venotomy site and inserted into the SVC/RA junction through a valved peel-away sheath. Position was  confirmed with fluoroscopy. Images were obtained for documentation. The patient tolerated the procedure well. No immediate complications. Incisions were closed in a two layer fashion with 4 - 0 Vicryl suture. Dermabond was applied to the skin. The port catheter was accessed, blood was aspirated followed by saline and heparin flushes. Needle was removed. A dry sterile dressing was applied. IMPRESSION: Ultrasound and fluoroscopically guided left internal jugular single lumen power port catheter insertion. Tip in the SVC/RA junction. Catheter ready for use. Electronically  Signed   By: Jerilynn Mages.  Shick M.D.   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 Benjamine Mola 12/21/2015 No obvious primary lesion noted on panendoscopy examination CT neck 12/17/2015 large infiltrative tumor mass extends to skin surface, infiltrates R patorid, R submandibular, R carotid sheath,Thrombosis of R IJ vein, multiple satellite nodules Centrilobular emphysema P16 negative Tobacco Use Tobacco Abuse Port a cath/FT placement on 01/17/2016 Multiple dental extractions with Dr. Enrique Sack 01/13/3016 Hyponatremia  Squamous cell carcinoma of head and neck ([place]) She has completed her first cycle of cisplatin, doing fairly well. She is moving forward with XRT, unfortunately has significant disease burden.  I have again emphasized the importance of coming in for IVF, anti-emetics or pain needs moving forward. She has IVF already pre-scheduled, she knows however if she needs more that she can come in.  She has used her FT some. She is able to currently swallow without difficulty, she is encouraged to continue her oral intake.   Cancer related pain Pain is currently controlled. We will continue to assess this weekly.    Insomnia due to medical condition She has been given a  prescription for ambien 10 mg and instructed to take it in bed. I advised her to not take the medication and stay up until she is "sleepy." She notes she understands.  We discussed her mood which she feels is still positive.  If this does not improve sleep she was instructed to call.   Absolute anemia She will need a transfusion of 2 U PRBC this week and we will arrange. We will also add B12, folate and iron studies including ferritin to next lab draw.    All questions were answered. The patient knows to call the clinic with any problems, questions or concerns.  This document serves as a record of services personally performed by Ancil Linsey, MD. It was created on her behalf by Arlyce Harman, a trained medical scribe. The creation of this record is based on the scribe's personal observations and the provider's statements to them. This document has been checked and approved by the attending provider.  I have reviewed the above documentation for accuracy and completeness, and I agree with the above.  This note was electronically signed.  Molli Hazard, MD  02/07/2016 2:56 PM

## 2016-02-07 NOTE — Progress Notes (Signed)
Patient tolerated infusion well.  VSS.   

## 2016-02-07 NOTE — Assessment & Plan Note (Signed)
She has completed her first cycle of cisplatin, doing fairly well. She is moving forward with XRT, unfortunately has significant disease burden.  I have again emphasized the importance of coming in for IVF, anti-emetics or pain needs moving forward. She has IVF already pre-scheduled, she knows however if she needs more that she can come in.  She has used her FT some. She is able to currently swallow without difficulty, she is encouraged to continue her oral intake.

## 2016-02-07 NOTE — Assessment & Plan Note (Signed)
Pain is currently controlled. We will continue to assess this weekly.

## 2016-02-07 NOTE — Patient Instructions (Addendum)
[place] at [facility] Discharge Instructions  RECOMMENDATIONS MADE BY THE CONSULTANT AND ANY TEST RESULTS WILL BE SENT TO YOUR REFERRING PHYSICIAN.   Exam and discussion by Dr Whitney Muse today Ambien prescription given Return to see the doctor as scheduled  Please call the clinic if you have any questions or concerns     Thank you for choosing [place] at [facility] to provide your oncology and hematology care.  To afford each patient quality time with our provider, please arrive at least 15 minutes before your scheduled appointment time.   Beginning January 23rd 2017 lab work for the [name] will be done in the  Main lab at [facility] on 1st floor. If you have a lab appointment with the [place] please come in thru the  Main Entrance and check in at the main information desk  You need to re-schedule your appointment should you arrive 10 or more minutes late.  We strive to give you quality time with our providers, and arriving late affects you and other patients whose appointments are after yours.  Also, if you no show three or more times for appointments you may be dismissed from the clinic at the providers discretion.     Again, thank you for choosing [facility].  Our hope is that these requests will decrease the amount of time that you wait before being seen by our physicians.       _____________________________________________________________  Should you have questions after your visit to [facility], please contact our office at [phone] between the hours of 8:30 a.m. and 4:30 p.m.  Voicemails left after 4:30 p.m. will not be returned until the following business day.  For prescription refill requests, have your pharmacy contact our office.         Resources For Cancer Patients and their Caregivers ? American Cancer Society: Can assist with transportation, wigs, general needs, runs  Look Good Feel Better.        5155639772 ? Cancer Care: Provides financial assistance, online support groups, medication/co-pay assistance.  1-800-813-HOPE (786)430-0057) ? [place] Assists [place] cancer patients and their families through emotional , educational and financial support.  8646082452 ? [place] DSS Where to apply for food stamps, Medicaid and utility assistance. 410-298-2583 ? RCATS: Transportation to medical appointments. 202-280-0045 ? Social Security Administration: May apply for disability if have a Stage IV cancer. 438-088-2954 667-179-5032 ? [employer], Disability and Transit Services: Assists with nutrition, care and transit needs. [phone]

## 2016-02-07 NOTE — Assessment & Plan Note (Signed)
She will need a transfusion of 2 U PRBC this week and we will arrange. We will also add B12, folate and iron studies including ferritin to next lab draw.

## 2016-02-07 NOTE — Patient Instructions (Signed)
[place] at [facility] Discharge Instructions  RECOMMENDATIONS MADE BY THE CONSULTANT AND ANY TEST RESULTS WILL BE SENT TO YOUR REFERRING PHYSICIAN.  IV fluids today.    Thank you for choosing [place] at [facility] to provide your oncology and hematology care.  To afford each patient quality time with our provider, please arrive at least 15 minutes before your scheduled appointment time.   Beginning January 23rd 2017 lab work for the [name] will be done in the  Main lab at [facility] on 1st floor. If you have a lab appointment with the [place] please come in thru the  Main Entrance and check in at the main information desk  You need to re-schedule your appointment should you arrive 10 or more minutes late.  We strive to give you quality time with our providers, and arriving late affects you and other patients whose appointments are after yours.  Also, if you no show three or more times for appointments you may be dismissed from the clinic at the providers discretion.     Again, thank you for choosing [facility].  Our hope is that these requests will decrease the amount of time that you wait before being seen by our physicians.       _____________________________________________________________  Should you have questions after your visit to [facility], please contact our office at [phone] between the hours of 8:30 a.m. and 4:30 p.m.  Voicemails left after 4:30 p.m. will not be returned until the following business day.  For prescription refill requests, have your pharmacy contact our office.         Resources For Cancer Patients and their Caregivers ? American Cancer Society: Can assist with transportation, wigs, general needs, runs Look Good Feel Better.        ([phone] [phone] ? Cancer Care: Provides financial assistance, online support groups, medication/co-pay assistance.   1-800-813-HOPE 9281549481) ? [place] Assists [place] cancer patients and their families through emotional , educational and financial support.  ([phone] ? [place] DSS Where to apply for food stamps, Medicaid and utility assistance. [phone] ? RCATS: Transportation to medical appointments. ([phone] ? Social Security Administration: May apply for disability if have a Stage IV cancer. [phone] [phone] ? [employer], Disability and Transit Services: Assists with nutrition, care and transit needs. [phone]

## 2016-02-07 NOTE — Telephone Encounter (Signed)
Pt notified that she will need blood, pt coming for a type and screen tomorrow, transfusion on Thursday when she is getting fluids

## 2016-02-08 ENCOUNTER — Encounter (HOSPITAL_COMMUNITY): Payer: Self-pay

## 2016-02-08 ENCOUNTER — Other Ambulatory Visit (HOSPITAL_COMMUNITY): Payer: Self-pay

## 2016-02-10 ENCOUNTER — Encounter (HOSPITAL_BASED_OUTPATIENT_CLINIC_OR_DEPARTMENT_OTHER): Payer: Medicaid Other

## 2016-02-10 ENCOUNTER — Encounter (HOSPITAL_COMMUNITY): Payer: MEDICAID | Attending: Hematology & Oncology

## 2016-02-10 VITALS — BP 126/76 | HR 72 | Temp 98.0°F | Resp 16

## 2016-02-10 DIAGNOSIS — D649 Anemia, unspecified: Secondary | ICD-10-CM | POA: Diagnosis not present

## 2016-02-10 DIAGNOSIS — C760 Malignant neoplasm of head, face and neck: Secondary | ICD-10-CM

## 2016-02-10 LAB — CBC WITH DIFFERENTIAL/PLATELET
Basophils Absolute: 0 10*3/uL (ref 0.0–0.1)
Basophils Relative: 1 %
EOS ABS: 0 10*3/uL (ref 0.0–0.7)
Eosinophils Relative: 1 %
HEMATOCRIT: 24.3 % — AB (ref 36.0–46.0)
HEMOGLOBIN: 8.7 g/dL — AB (ref 12.0–15.0)
LYMPHS ABS: 0.3 10*3/uL — AB (ref 0.7–4.0)
LYMPHS PCT: 9 %
MCH: 31.4 pg (ref 26.0–34.0)
MCHC: 35.8 g/dL (ref 30.0–36.0)
MCV: 87.7 fL (ref 78.0–100.0)
MONOS PCT: 13 %
Monocytes Absolute: 0.4 10*3/uL (ref 0.1–1.0)
NEUTROS PCT: 76 %
Neutro Abs: 2.2 10*3/uL (ref 1.7–7.7)
Platelets: 203 10*3/uL (ref 150–400)
RBC: 2.77 MIL/uL — ABNORMAL LOW (ref 3.87–5.11)
RDW: 12.2 % (ref 11.5–15.5)
WBC: 2.9 10*3/uL — ABNORMAL LOW (ref 4.0–10.5)

## 2016-02-10 LAB — ABO/RH: ABO/RH(D): A POS

## 2016-02-10 LAB — PREPARE RBC (CROSSMATCH)

## 2016-02-10 MED ORDER — DIPHENHYDRAMINE HCL 50 MG/ML IJ SOLN
25.0000 mg | Freq: Once | INTRAMUSCULAR | Status: AC
  Administered 2016-02-10: 25 mg via INTRAVENOUS

## 2016-02-10 MED ORDER — SODIUM CHLORIDE 0.9% FLUSH: 10.0000 mL | INTRAVENOUS | Status: DC | PRN

## 2016-02-10 MED ORDER — ACETAMINOPHEN 325 MG PO TABS
ORAL_TABLET | ORAL | Status: AC
  Filled 2016-02-10: qty 2

## 2016-02-10 MED ORDER — HEPARIN SOD (PORK) LOCK FLUSH 100 UNIT/ML IV SOLN
500.0000 [IU] | Freq: Every day | INTRAVENOUS | Status: AC | PRN
  Administered 2016-02-10: 500 [IU]

## 2016-02-10 MED ORDER — MAGNESIUM OXIDE 400 (241.3 MG) MG PO TABS: 400.0000 mg | ORAL_TABLET | Freq: Two times a day (BID) | ORAL | Status: DC

## 2016-02-10 MED ORDER — MAGNESIUM SULFATE 50 % IJ SOLN
500.0000 mg | Freq: Once | INTRAVENOUS | Status: DC
  Filled 2016-02-10: qty 1

## 2016-02-10 MED ORDER — DIPHENHYDRAMINE HCL 50 MG/ML IJ SOLN
INTRAMUSCULAR | Status: AC
  Filled 2016-02-10: qty 1

## 2016-02-10 MED ORDER — DIPHENHYDRAMINE HCL 25 MG PO CAPS
ORAL_CAPSULE | ORAL | Status: AC
  Filled 2016-02-10: qty 2

## 2016-02-10 MED ORDER — SODIUM CHLORIDE 0.9 % IV SOLN
Freq: Once | INTRAVENOUS | Status: AC
Start: 2016-02-10 — End: 2016-02-10
  Administered 2016-02-10: 10:00:00 via INTRAVENOUS

## 2016-02-10 MED ORDER — POTASSIUM CHLORIDE 20 MEQ PO PACK: 40.0000 meq | PACK | Freq: Two times a day (BID) | ORAL | Status: DC

## 2016-02-10 MED ORDER — ACETAMINOPHEN 325 MG PO TABS
650.0000 mg | ORAL_TABLET | Freq: Once | ORAL | Status: AC
  Administered 2016-02-10: 650 mg via ORAL

## 2016-02-10 MED ORDER — MAGNESIUM SULFATE 2 GM/50ML IV SOLN
2.0000 g | Freq: Once | INTRAVENOUS | Status: AC
  Administered 2016-02-10: 2 g via INTRAVENOUS
  Filled 2016-02-10: qty 50

## 2016-02-10 NOTE — Patient Instructions (Signed)
[place] at [facility] Discharge Instructions  RECOMMENDATIONS MADE BY THE CONSULTANT AND ANY TEST RESULTS WILL BE SENT TO YOUR REFERRING PHYSICIAN.  One unit of blood, IV fluids, and magnesium today.    Thank you for choosing [place] at [facility] to provide your oncology and hematology care.  To afford each patient quality time with our provider, please arrive at least 15 minutes before your scheduled appointment time.   Beginning January 23rd 2017 lab work for the [name] will be done in the  Main lab at [facility] on 1st floor. If you have a lab appointment with the [place] please come in thru the  Main Entrance and check in at the main information desk  You need to re-schedule your appointment should you arrive 10 or more minutes late.  We strive to give you quality time with our providers, and arriving late affects you and other patients whose appointments are after yours.  Also, if you no show three or more times for appointments you may be dismissed from the clinic at the providers discretion.     Again, thank you for choosing [facility].  Our hope is that these requests will decrease the amount of time that you wait before being seen by our physicians.       _____________________________________________________________  Should you have questions after your visit to [facility], please contact our office at [phone] between the hours of 8:30 a.m. and 4:30 p.m.  Voicemails left after 4:30 p.m. will not be returned until the following business day.  For prescription refill requests, have your pharmacy contact our office.         Resources For Cancer Patients and their Caregivers ? American Cancer Society: Can assist with transportation, wigs, general needs, runs Look Good Feel Better.        917-791-1231 ? Cancer Care: Provides financial assistance, online support groups,  medication/co-pay assistance.  1-800-813-HOPE 978-204-7309) ? [place] Assists [place] cancer patients and their families through emotional , educational and financial support.  [phone] ? [place] DSS Where to apply for food stamps, Medicaid and utility assistance. [phone] ? RCATS: Transportation to medical appointments. 845-651-0381 ? Social Security Administration: May apply for disability if have a Stage IV cancer. [phone] ? [employer], Disability and Transit Services: Assists with nutrition, care and transit needs. [phone]

## 2016-02-10 NOTE — Progress Notes (Signed)
Please see other encounter for today for documentation.   

## 2016-02-10 NOTE — Progress Notes (Signed)
Patient tolerated infusion and transfusion well.  Consent signed for blood.  VSS throughout.

## 2016-02-11 LAB — TYPE AND SCREEN
ABO/RH(D): A POS
Antibody Screen: NEGATIVE
UNIT DIVISION: 0

## 2016-02-13 NOTE — Progress Notes (Signed)
NO SHOW

## 2016-02-14 ENCOUNTER — Ambulatory Visit (HOSPITAL_COMMUNITY): Payer: Self-pay | Admitting: Oncology

## 2016-02-14 ENCOUNTER — Inpatient Hospital Stay (HOSPITAL_COMMUNITY): Payer: Self-pay

## 2016-02-14 ENCOUNTER — Other Ambulatory Visit (HOSPITAL_COMMUNITY): Payer: Self-pay | Admitting: Hematology & Oncology

## 2016-02-14 ENCOUNTER — Ambulatory Visit (HOSPITAL_COMMUNITY): Payer: Self-pay

## 2016-02-15 ENCOUNTER — Ambulatory Visit (HOSPITAL_COMMUNITY): Payer: MEDICAID | Attending: Hematology & Oncology | Admitting: Speech Pathology

## 2016-02-15 ENCOUNTER — Encounter: Payer: Self-pay | Admitting: Dietician

## 2016-02-15 NOTE — Progress Notes (Signed)
Following up with patient in light of continued wt loss.  Contacted Pt by Phone   Wt Readings from Last 10 Encounters:  02/07/16 148 lb (67.132 kg)  01/31/16 146 lb 12.8 oz (66.588 kg)  01/21/16 150 lb (68.04 kg)  01/13/16 153 lb 2 oz (69.457 kg)  01/10/16 153 lb 2 oz (69.457 kg)  01/05/16 153 lb (69.4 kg)  12/28/15 153 lb 9.6 oz (69.673 kg)  12/21/15 153 lb (69.4 kg)   Oncology nurse report that pt's weight today was 141 lbs. This is loss of roughly 12 lbs (>7.5% bw) since she was first seen by RD 1 month ago.   Despite pt's significant wt loss, she sounded to be doing well. She states she "has only had 1 bad day" (yesterday) her entire treatment. She says the soreness is not the bad and she is still has considerable PO intake. She "nibbles throughout the day" ( mostly soups) and RN reports she drinks 4-5 glasses of water daily. Her biggest complaint is with the amount of saliva she is producing. She says she cannot stop spitting.   SLP had reported that she had been vomiting during her initial assessment though pt states this has not really been an issue. RD asked how ST was progressing. Pt states that she is not going today as she "doesnt need it right now and it is just another trip I would have to make". She says she is still able to talk fine which sounded to be  part of her rationale.   RD inquired about TF regimen. RD had given her goal of 5 cans Osmolite 1.5 when her PO intake was minimal. Advised her to slowly titrate the amount of cans up as her PO intake decreases.   Pt is currently doing 3 cans daily. She reports flushing with 2 large syringe fulls (60cc?) before and after each feed. This is close to what RD had reccommended: 2.5 syringes (150 cc before and after). She was agreeable to increasing to 4 cans a day.  Pt had concern regarding her PEG tube protruding more from her skin. She states "it has come out more". She quantified it as a half an inch. She denies tenderness,  redness, or leakage. RD advised that this is likely due to her wt loss, though MD can assess Thursday.  RD to f/u for TF tolerance to 4 cans when she receives her IVF this Thursday  [place], LDN Nutrition Pager: [phone] 02/15/2016 12:49 PM

## 2016-02-17 ENCOUNTER — Ambulatory Visit (HOSPITAL_COMMUNITY): Payer: Self-pay

## 2016-02-17 ENCOUNTER — Encounter (HOSPITAL_BASED_OUTPATIENT_CLINIC_OR_DEPARTMENT_OTHER): Payer: Medicaid Other

## 2016-02-17 ENCOUNTER — Encounter: Payer: Self-pay | Admitting: Dietician

## 2016-02-17 ENCOUNTER — Other Ambulatory Visit (HOSPITAL_COMMUNITY): Payer: Self-pay | Admitting: Oncology

## 2016-02-17 DIAGNOSIS — R110 Nausea: Secondary | ICD-10-CM

## 2016-02-17 DIAGNOSIS — C760 Malignant neoplasm of head, face and neck: Secondary | ICD-10-CM | POA: Diagnosis not present

## 2016-02-17 DIAGNOSIS — D649 Anemia, unspecified: Secondary | ICD-10-CM

## 2016-02-17 DIAGNOSIS — R634 Abnormal weight loss: Secondary | ICD-10-CM

## 2016-02-17 DIAGNOSIS — E875 Hyperkalemia: Secondary | ICD-10-CM

## 2016-02-17 LAB — COMPREHENSIVE METABOLIC PANEL
ALT: 13 U/L — AB (ref 14–54)
ANION GAP: 9 (ref 5–15)
AST: 16 U/L (ref 15–41)
Albumin: 3.5 g/dL (ref 3.5–5.0)
Alkaline Phosphatase: 86 U/L (ref 38–126)
BUN: 31 mg/dL — ABNORMAL HIGH (ref 6–20)
CHLORIDE: 88 mmol/L — AB (ref 101–111)
CO2: 28 mmol/L (ref 22–32)
CREATININE: 1.53 mg/dL — AB (ref 0.44–1.00)
Calcium: 8.8 mg/dL — ABNORMAL LOW (ref 8.9–10.3)
GFR, EST AFRICAN AMERICAN: 42 mL/min — AB (ref >60)
GFR, EST NON AFRICAN AMERICAN: 36 mL/min — AB (ref >60)
Glucose, Bld: 119 mg/dL — ABNORMAL HIGH (ref 65–99)
Potassium: 5.6 mmol/L — ABNORMAL HIGH (ref 3.5–5.1)
Sodium: 125 mmol/L — ABNORMAL LOW (ref 135–145)
Total Bilirubin: 0.5 mg/dL (ref 0.3–1.2)
Total Protein: 7 g/dL (ref 6.5–8.1)

## 2016-02-17 LAB — IRON AND TIBC
IRON: 17 ug/dL — AB (ref 28–170)
SATURATION RATIOS: 7 % — AB (ref 10.4–31.8)
TIBC: 239 ug/dL — AB (ref 250–450)
UIBC: 222 ug/dL

## 2016-02-17 LAB — VITAMIN B12: Vitamin B-12: 646 pg/mL (ref 180–914)

## 2016-02-17 LAB — FOLATE: FOLATE: 48 ng/mL (ref >5.9)

## 2016-02-17 LAB — FERRITIN: Ferritin: 466 ng/mL — ABNORMAL HIGH (ref 11–307)

## 2016-02-17 MED ORDER — DEXAMETHASONE SODIUM PHOSPHATE 10 MG/ML IJ SOLN: 10.0000 mg | Freq: Once | INTRAMUSCULAR | Status: DC

## 2016-02-17 MED ORDER — SODIUM CHLORIDE 0.9 % IV SOLN
INTRAVENOUS | Status: DC
  Administered 2016-02-17: 12:00:00 via INTRAVENOUS

## 2016-02-17 MED ORDER — LORAZEPAM 2 MG/ML IJ SOLN
0.5000 mg | Freq: Once | INTRAMUSCULAR | Status: AC
Start: 2016-02-17 — End: 2016-02-17
  Administered 2016-02-17: 0.5 mg via INTRAVENOUS

## 2016-02-17 MED ORDER — SODIUM POLYSTYRENE SULFONATE 15 GM/60ML PO SUSP
30.0000 g | Freq: Once | ORAL | Status: AC
  Administered 2016-02-17: 30 g via ORAL

## 2016-02-17 MED ORDER — LORAZEPAM 0.5 MG PO TABS: ORAL_TABLET | ORAL | Status: DC

## 2016-02-17 MED ORDER — PALONOSETRON HCL INJECTION 0.25 MG/5ML
0.2500 mg | Freq: Once | INTRAVENOUS | Status: AC
  Administered 2016-02-17: 0.25 mg via INTRAVENOUS

## 2016-02-17 MED ORDER — PALONOSETRON HCL INJECTION 0.25 MG/5ML
INTRAVENOUS | Status: AC
  Filled 2016-02-17: qty 5

## 2016-02-17 MED ORDER — SODIUM CHLORIDE 0.9 % IV SOLN
10.0000 mg | Freq: Once | INTRAVENOUS | Status: AC
  Administered 2016-02-17: 10 mg via INTRAVENOUS
  Filled 2016-02-17: qty 1

## 2016-02-17 MED ORDER — LORAZEPAM 2 MG/ML IJ SOLN
INTRAMUSCULAR | Status: AC
  Filled 2016-02-17: qty 1

## 2016-02-17 MED ORDER — HEPARIN SOD (PORK) LOCK FLUSH 100 UNIT/ML IV SOLN
500.0000 [IU] | Freq: Once | INTRAVENOUS | Status: AC
  Administered 2016-02-17: 500 [IU] via INTRAVENOUS
  Filled 2016-02-17: qty 5

## 2016-02-17 MED ORDER — SODIUM POLYSTYRENE SULFONATE 15 GM/60ML PO SUSP
30.0000 g | Freq: Once | ORAL | Status: AC
  Filled 2016-02-17: qty 120

## 2016-02-17 MED ORDER — LORAZEPAM 2 MG/ML IJ SOLN
0.5000 mg | Freq: Once | INTRAMUSCULAR | Status: AC
  Administered 2016-02-17: 0.5 mg via INTRAVENOUS

## 2016-02-17 NOTE — Patient Instructions (Addendum)
[place] at [facility] Discharge Instructions  RECOMMENDATIONS MADE BY THE CONSULTANT AND ANY TEST RESULTS WILL BE SENT TO YOUR REFERRING PHYSICIAN.   HOLD POTASSIUM due to Potassium level of 5.6. Kayexalate given. This reduces your potassium level. It will probably make you poop. This is the job of Kayexalate.  For your nausea:   Zofran/Ondansetron 8mg  tablet. Take 2 tablets every 8 hours. (Can cause constipation)  Compazine/Prochlorperazine 10mg  tablet. Take 1 tablet every 6 hours.  (Can cause drowsiness)  Ativan/lorazepam 0.5mg  tablet. Take 1 tablet every 4-6 hours as needed for nausea/vomiting. (Can cause drowsiness)  Stagger the times that you take these medications. But TAKE them! These medications can make you feel sleepy. Please be careful with changing positions such as going from a lying to sitting position or sitting to standing. Please get up and down slowly to avoid/prevent falls.   Return on Monday for IV fluids.  If you begin to vomit, report to the ER. We do not want you developing kidney damage from dehydration.     Thank you for choosing [place] at [facility] to provide your oncology and hematology care.  To afford each patient quality time with our provider, please arrive at least 15 minutes before your scheduled appointment time.   Beginning January 23rd 2017 lab work for the [name] will be done in the  Main lab at [facility] on 1st floor. If you have a lab appointment with the [place] please come in thru the  Main Entrance and check in at the main information desk  You need to re-schedule your appointment should you arrive 10 or more minutes late.  We strive to give you quality time with our providers, and arriving late affects you and other patients whose appointments are after yours.  Also, if you no show three or more times for appointments you may be dismissed from the clinic at the providers  discretion.     Again, thank you for choosing [facility].  Our hope is that these requests will decrease the amount of time that you wait before being seen by our physicians.       _____________________________________________________________  Should you have questions after your visit to [facility], please contact our office at [phone] between the hours of 8:30 a.m. and 4:30 p.m.  Voicemails left after 4:30 p.m. will not be returned until the following business day.  For prescription refill requests, have your pharmacy contact our office.         Resources For Cancer Patients and their Caregivers ? American Cancer Society: Can assist with transportation, wigs, general needs, runs Look Good Feel Better.        [phone] ? Cancer Care: Provides financial assistance, online support groups, medication/co-pay assistance.  1-800-813-HOPE 513-584-8322) ? [place] Assists [place] cancer patients and their families through emotional , educational and financial support.  [phone] ? [place] DSS Where to apply for food stamps, Medicaid and utility assistance. [phone] ? RCATS: Transportation to medical appointments. [phone] ? Social Security Administration: May apply for disability if have a Stage IV cancer. [phone] ? [employer], Disability and Transit Services: Assists with nutrition, care and transit needs. [phone]

## 2016-02-17 NOTE — Progress Notes (Signed)
Tolerated IVF over 3 hours with no problems. Pt took Kayexalate prior to leaving. I reviewed pt's discharge instructions with her and gave AVS to her.

## 2016-02-17 NOTE — Progress Notes (Signed)
Had contacted pt a few days ago due to wt loss and suspected poor PO intake given lab results. It turned out pt had only been using 3 cans of TF instead of reccommended 5. RD asked her to increase to 4 at that time and he would follow up today/  Contacted Pt by visiting during IVF administration   Wt Readings from Last 10 Encounters:  02/17/16 140 lb 6.4 oz (63.685 kg)  02/07/16 148 lb (67.132 kg)  01/31/16 146 lb 12.8 oz (66.588 kg)  01/21/16 150 lb (68.04 kg)  01/13/16 153 lb 2 oz (69.457 kg)  01/10/16 153 lb 2 oz (69.457 kg)  01/05/16 153 lb (69.4 kg)  12/28/15 153 lb 9.6 oz (69.673 kg)  12/21/15 153 lb (69.4 kg)   Pt was extremely nauseous today.   Unfortunately, pt states she has been only able to get t o 3 cans, but she "is working on it". She denies any intolerance to the 3 cans she does administer. However notes she can only tolerate 1 can every few hours. She says she flushes w/ 120 cc before and after.   During discussion, pt was weighed at an alarming 140.5 lbs indicating a loss of ~8 lbs in 10 days. RD addressed that is in imperative she increased her TF.   Pt states that her nausea is by far the biggest factor inhibiting her ability to use 4 cans of TF daily.   RD asked PA if her medication could be either increased or a new antiemetic could be added to aid her citing that her nausea is greatly compromising her nutritional status.   Patient still reports oral intake. She eats soups and ice cream.   RD to follow up with her in a week to see how she is doing.   Burtis Junes RD, LDN Nutrition Pager: [phone] 02/17/2016 2:02 PM

## 2016-02-21 ENCOUNTER — Encounter (HOSPITAL_COMMUNITY): Payer: Medicaid Other | Admitting: Hematology & Oncology

## 2016-02-21 ENCOUNTER — Encounter (HOSPITAL_BASED_OUTPATIENT_CLINIC_OR_DEPARTMENT_OTHER): Payer: Medicaid Other

## 2016-02-21 ENCOUNTER — Encounter (HOSPITAL_COMMUNITY): Payer: Self-pay | Admitting: Hematology & Oncology

## 2016-02-21 VITALS — BP 142/80 | HR 82 | Temp 98.2°F | Resp 16 | Wt 140.4 lb

## 2016-02-21 DIAGNOSIS — I10 Essential (primary) hypertension: Secondary | ICD-10-CM

## 2016-02-21 DIAGNOSIS — C760 Malignant neoplasm of head, face and neck: Secondary | ICD-10-CM

## 2016-02-21 DIAGNOSIS — Z5111 Encounter for antineoplastic chemotherapy: Secondary | ICD-10-CM | POA: Diagnosis not present

## 2016-02-21 DIAGNOSIS — D649 Anemia, unspecified: Secondary | ICD-10-CM

## 2016-02-21 DIAGNOSIS — E871 Hypo-osmolality and hyponatremia: Secondary | ICD-10-CM

## 2016-02-21 DIAGNOSIS — Z720 Tobacco use: Secondary | ICD-10-CM

## 2016-02-21 DIAGNOSIS — N179 Acute kidney failure, unspecified: Secondary | ICD-10-CM

## 2016-02-21 LAB — CBC WITH DIFFERENTIAL/PLATELET
BASOS ABS: 0 10*3/uL (ref 0.0–0.1)
Basophils Relative: 0 %
EOS ABS: 0.1 10*3/uL (ref 0.0–0.7)
EOS PCT: 1 %
HCT: 28.2 % — ABNORMAL LOW (ref 36.0–46.0)
Hemoglobin: 9.4 g/dL — ABNORMAL LOW (ref 12.0–15.0)
LYMPHS ABS: 0.3 10*3/uL — AB (ref 0.7–4.0)
LYMPHS PCT: 7 %
MCH: 30 pg (ref 26.0–34.0)
MCHC: 33.3 g/dL (ref 30.0–36.0)
MCV: 90.1 fL (ref 78.0–100.0)
MONO ABS: 1 10*3/uL (ref 0.1–1.0)
Monocytes Relative: 22 %
Neutro Abs: 3.1 10*3/uL (ref 1.7–7.7)
Neutrophils Relative %: 70 %
PLATELETS: 420 10*3/uL — AB (ref 150–400)
RBC: 3.13 MIL/uL — AB (ref 3.87–5.11)
RDW: 13.5 % (ref 11.5–15.5)
WBC: 4.5 10*3/uL (ref 4.0–10.5)

## 2016-02-21 LAB — COMPREHENSIVE METABOLIC PANEL
ALT: 17 U/L (ref 14–54)
AST: 21 U/L (ref 15–41)
Albumin: 3.4 g/dL — ABNORMAL LOW (ref 3.5–5.0)
Alkaline Phosphatase: 89 U/L (ref 38–126)
Anion gap: 13 (ref 5–15)
BUN: 34 mg/dL — ABNORMAL HIGH (ref 6–20)
CHLORIDE: 86 mmol/L — AB (ref 101–111)
CO2: 29 mmol/L (ref 22–32)
Calcium: 9.1 mg/dL (ref 8.9–10.3)
Creatinine, Ser: 1.29 mg/dL — ABNORMAL HIGH (ref 0.44–1.00)
GFR, EST AFRICAN AMERICAN: 51 mL/min — AB (ref >60)
GFR, EST NON AFRICAN AMERICAN: 44 mL/min — AB (ref >60)
Glucose, Bld: 102 mg/dL — ABNORMAL HIGH (ref 65–99)
POTASSIUM: 4.4 mmol/L (ref 3.5–5.1)
SODIUM: 128 mmol/L — AB (ref 135–145)
Total Bilirubin: 0.3 mg/dL (ref 0.3–1.2)
Total Protein: 7 g/dL (ref 6.5–8.1)

## 2016-02-21 LAB — MAGNESIUM: MAGNESIUM: 2.1 mg/dL (ref 1.7–2.4)

## 2016-02-21 MED ORDER — PALONOSETRON HCL INJECTION 0.25 MG/5ML
0.2500 mg | Freq: Once | INTRAVENOUS | Status: AC
  Administered 2016-02-21: 0.25 mg via INTRAVENOUS
  Filled 2016-02-21: qty 5

## 2016-02-21 MED ORDER — SODIUM CHLORIDE 0.9 % IV SOLN
Freq: Once | INTRAVENOUS | Status: AC
  Administered 2016-02-21: 14:00:00 via INTRAVENOUS
  Filled 2016-02-21: qty 5

## 2016-02-21 MED ORDER — SODIUM CHLORIDE 0.9 % IV SOLN
100.0000 mg/m2 | Freq: Once | INTRAVENOUS | Status: AC
  Administered 2016-02-21: 177 mg via INTRAVENOUS
  Filled 2016-02-21: qty 177

## 2016-02-21 MED ORDER — POTASSIUM CHLORIDE 2 MEQ/ML IV SOLN
Freq: Once | INTRAVENOUS | Status: AC
  Administered 2016-02-21: 12:00:00 via INTRAVENOUS
  Filled 2016-02-21: qty 10

## 2016-02-21 MED ORDER — SODIUM CHLORIDE 0.9% FLUSH: 10.0000 mL | INTRAVENOUS | Status: DC | PRN

## 2016-02-21 MED ORDER — PEGFILGRASTIM 6 MG/0.6ML ~~LOC~~ PSKT
6.0000 mg | PREFILLED_SYRINGE | Freq: Once | SUBCUTANEOUS | Status: AC
  Administered 2016-02-21: 6 mg via SUBCUTANEOUS
  Filled 2016-02-21: qty 0.6

## 2016-02-21 MED ORDER — HEPARIN SOD (PORK) LOCK FLUSH 100 UNIT/ML IV SOLN
500.0000 [IU] | Freq: Once | INTRAVENOUS | Status: AC | PRN
  Administered 2016-02-21: 500 [IU]
  Filled 2016-02-21 (×2): qty 5

## 2016-02-21 NOTE — Patient Instructions (Signed)
[place] at [facility] Discharge Instructions  RECOMMENDATIONS MADE BY THE CONSULTANT AND ANY TEST RESULTS WILL BE SENT TO YOUR REFERRING PHYSICIAN.  Exam done and seen today by Dr. Whitney Muse Stop the Lisinopril/HCTZ You need to increase your fluids every day. You need to come everyday for fluids. If you do not do this you will end up on Dialysis. You need to flush your peg tube each day also. Return to see the doctor in one week with your hydration appointment. Please call the clinic if you have any questions or concerns  Thank you for choosing [place] at [facility] to provide your oncology and hematology care.  To afford each patient quality time with our provider, please arrive at least 15 minutes before your scheduled appointment time.   Beginning January 23rd 2017 lab work for the [name] will be done in the  Main lab at [facility] on 1st floor. If you have a lab appointment with the [place] please come in thru the  Main Entrance and check in at the main information desk  You need to re-schedule your appointment should you arrive 10 or more minutes late.  We strive to give you quality time with our providers, and arriving late affects you and other patients whose appointments are after yours.  Also, if you no show three or more times for appointments you may be dismissed from the clinic at the providers discretion.     Again, thank you for choosing [facility].  Our hope is that these requests will decrease the amount of time that you wait before being seen by our physicians.       _____________________________________________________________  Should you have questions after your visit to [facility], please contact our office at [phone] between the hours of 8:30 a.m. and 4:30 p.m.  Voicemails left after 4:30 p.m. will not be returned until the following business day.  For prescription refill  requests, have your pharmacy contact our office.         Resources For Cancer Patients and their Caregivers ? American Cancer Society: Can assist with transportation, wigs, general needs, runs Look Good Feel Better.        [phone] ? Cancer Care: Provides financial assistance, online support groups, medication/co-pay assistance.  1-800-813-HOPE 2083944789) ? [place] Assists [place] cancer patients and their families through emotional , educational and financial support.  [phone] ? [place] DSS Where to apply for food stamps, Medicaid and utility assistance. [phone] ? RCATS: Transportation to medical appointments. 4344327396 ? Social Security Administration: May apply for disability if have a Stage IV cancer. [phone] ? [employer], Disability and Transit Services: Assists with nutrition, care and transit needs. [phone]

## 2016-02-21 NOTE — Progress Notes (Signed)
Discussed labs with MD and she was concerned that the patient may still be taking hydrochlorothiazide.  I questioned the patient on this matter and she is unsure of which medication this is but states that she is still taking her lisinopril, which is in combo with the HCTZ.  She was instructed to not take this medication due to kidney function.  She verbalized understanding.    Patient tolerated infusion well.  Understands that she is to return tomorrow and every day this week for IV fluids.  New schedule was given to the patient as well as written instruction on stopping her HCTZ.

## 2016-02-21 NOTE — Progress Notes (Signed)
Gina Frederick at [facility] Progress Note  Patient Care Team: Gina Frederick. 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 Benjamine Frederick 12/21/2015 No obvious primary lesion noted on panendoscopy examination CT neck 12/17/2015 large infiltrative tumor mass extends to skin surface, infiltrates R patorid, R submandibular, R carotid sheath,Thrombosis of R IJ vein, multiple satellite nodules Centrilobular emphysema P16 negative Tobacco Abuse Port a cath/FT placement on 01/17/2016 Multiple dental extractions with Dr. Enrique Frederick 01/13/3016   HISTORY OF PRESENTING ILLNESS:  Gina Frederick 60 y.o. 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   Gina Frederick was here alone today and was in a treatment chair receiving Cycle #2 Cisplatin.  She says that she has good days and bad days with her pain, but not many bad days. She said that she just aches. She takes Tylenol for pain and if this does not work she breaks a hydrocodone in half and takes this. She cannot swallow a whole hydrocodone. She says that this seems to work and does not want any more pain medication.  She said that she is "pretty chipper" but that she had a bad day yesterday because her hair fell out.  She has cut back on her smoking and only smokes 1 cigarette a day but not every day. She smoked one this morning.  She has been using her feeding tube and uses 4 cans a day. She is still eating some by mouth.  She did not show up last week for scheduled IV hydration.   She states that she has been sleeping well and her pain does not keep her awake.   She was  advised to stop taking her blood pressure medication secondary to persistent hyponatremia, she was told last week as well but states she got "confused."  She did not need any prescription refills today.  MEDICAL HISTORY:  Past Medical History  Diagnosis Date  . Hypertension   . Arthritis     hands  . Neck mass     right neck(surfaced 11'16,then progressively enlarged"- denies swallwing or breathing difficulty.  . Hyperlipidemia   . Cancer ([place])     dx. squamous cell carcinoma-right neck    SURGICAL HISTORY: Past Surgical History  Procedure Laterality Date  . Tonsillectomy    . Knee arthroscopy      right  . Cesarean section    . Mass biopsy Right 12/21/2015    Procedure: NECK MASS BIOPSY;  Surgeon: Gina Baptist, MD;  Location: [facility];  Service: ENT;  Laterality: Right;  . Panendoscopy N/A 12/21/2015    Procedure: PANENDOSCOPY;  Surgeon: Gina Baptist, MD;  Location: [facility];  Service: ENT;  Laterality: N/A;  . Multiple extractions with alveoloplasty N/A 01/13/2016    Procedure: EXTRACTION OF TOOTH #'S 6-12,15, 19-28, 30 WITH ALVEOLOPLASTY AND BILATERAL MAXILLARY TUBEROSITY REDUCTIONS;  Surgeon: Lenn Cal, DDS;  Location: [facility];  Service: Oral Surgery;  Laterality: N/A;    SOCIAL HISTORY: Social History   Social History  . Marital Status: Divorced    Spouse Name: N/A  . Number of Children: 1  . Years of Education: N/A   Occupational History  .  Not on file.   Social History Main Topics  . Smoking status: Current Every Day Smoker -- 0.25 packs/day    Types: Cigarettes  . Smokeless tobacco: Never Used     Comment: cut back to 1 cigarette per day  . Alcohol Use: No  . Drug Use: No  . Sexual Activity: No   Other Topics Concern  . Not on file   Social History Narrative   She is divorced. One daughter, no grandchildren Smokes, notes that she is down to 3 a day. Began smoking "probably in her 56's." She says she has quit "on and off  through the years." Denies problems with EtOH. 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 Quit working when her daughter was "of age to go to school," then stayed home with her daughter. For hobbies, she says she likes to read.  Is currently the caregiver for a lady with diabetes.  She does light housekeeping and keeps her on her medications, and cooks. The son of the woman she cares for, Gina Frederick, notes that her good cooking has brought his momma's blood pressure and blood sugar down.  She notes she has a good support system with her sister, and with the son of the woman she takes care of.  FAMILY HISTORY: Family History  Problem Relation Age of Onset  . Alzheimer's disease Mother   . Congestive Heart Failure Father    indicated that her mother is deceased. She indicated that her father is deceased.   Mother and father are deceased. Mother was 62; died of Alzheimer's. Father was 45; congestive heart failure. Has a sister, age 57 this year. Had a brother, deceased. He took his own life. 4 nieces and nephews.  ALLERGIES:  is allergic to codeine.  MEDICATIONS:  Current Outpatient Prescriptions  Medication Sig Dispense Refill  . acetaminophen (TYLENOL) 500 MG tablet Take 500 mg by mouth every 6 (six) hours as needed.    Marland Kitchen CISPLATIN IV Inject into the vein. Reported on 01/21/2016    . diphenhydrAMINE (BENADRYL) 25 MG tablet Take 25 mg by mouth every 6 (six) hours as needed.    . lidocaine-prilocaine (EMLA) cream Apply a quarter size amount to port site 1 hour prior to chemo. Do not rub in. Cover with plastic wrap. 30 g 3  . lisinopril-hydrochlorothiazide (PRINZIDE,ZESTORETIC) 20-12.5 MG tablet Take 1 tablet by mouth daily. Reported on 02/10/2016    . LORazepam (ATIVAN) 0.5 MG tablet Take 1 tablet every 4-6 hours as needed for nausea/vomiting. 45 tablet 0  . lovastatin (MEVACOR) 20 MG tablet Take 20 mg by mouth at  bedtime. Reported on 02/17/2016    . magnesium oxide (MAG-OX) 400 (241.3 Mg) MG tablet Take 1 tablet (400 mg total) by mouth 2 (two) times daily. 60 tablet 3  . naproxen sodium (ANAPROX) 220 MG tablet Take 220 mg by mouth 2 (two) times daily as needed (Pain). Reported on 02/17/2016    . ondansetron (ZOFRAN) 8 MG tablet TAKE 1 TABLET EVERY 8 HOURS AS NEEDED FOR NAUSEA AND VOMITING 30 tablet 6  . oxyCODONE-acetaminophen (PERCOCET) 10-325 MG tablet Take every 2-4 hours as needed for pain 60 tablet 0  . potassium chloride (KLOR-CON) 20 MEQ packet Take 40 mEq by mouth 2 (two) times daily. 100 packet 1  . prochlorperazine (COMPAZINE) 10 MG tablet Take 1 tablet (10 mg total) by mouth every 6 (six) hours as needed for nausea or vomiting.  30 tablet 2  . Tetrahydrozoline-Zn Sulfate (EYE DROPS ALLERGY RELIEF OP) Apply 1 drop to eye 2 (two) times daily as needed (Itching eyes).    Marland Kitchen zolpidem (AMBIEN) 10 MG tablet Take 1 tablet (10 mg total) by mouth at bedtime as needed for sleep. 30 tablet 0   No current facility-administered medications for this visit.   Facility-Administered Medications Ordered in Other Visits  Medication Dose Route Frequency Provider Last Rate Last Dose  . CISplatin (PLATINOL) 177 mg in sodium chloride 0.9 % 500 mL chemo infusion  100 mg/m2 (Treatment Plan Actual) Intravenous Once Gina Ranks, MD      . dextrose 5 % and 0.45% NaCl 1,000 mL with potassium chloride 20 mEq, magnesium sulfate 12 mEq, mannitol 12.5 g infusion   Intravenous Once Gina Ranks, MD      . fosaprepitant (EMEND) 150 mg, dexamethasone (DECADRON) 12 mg in sodium chloride 0.9 % 145 mL IVPB   Intravenous Once Gina Ranks, MD      . heparin lock flush 100 unit/mL  500 Units Intracatheter Once PRN Gina Ranks, MD      . palonosetron (ALOXI) injection 0.25 mg  0.25 mg Intravenous Once Gina Ranks, MD      . pegfilgrastim (NEULASTA ONPRO KIT) injection 6 mg  6 mg Subcutaneous Once Gina Ranks, MD      . sodium chloride flush (NS) 0.9 % injection 10 mL  10 mL Intracatheter PRN Gina Ranks, MD      . sodium polystyrene (KAYEXALATE) 15 GM/60ML suspension 30 g  30 g Oral Once Baird Cancer, PA-C        Review of Systems  Constitutional: Negative for fever, chills and malaise/fatigue.  HENT: Negative for congestion, hearing loss, nosebleeds, sore throat and tinnitus.   Eyes: Negative.  Negative for blurred vision, double vision, pain and discharge.  Respiratory: Positive for cough. Negative for hemoptysis, sputum production, shortness of breath and wheezing.        "Light cough" since starting her blood pressure medication.  Cardiovascular: Negative.  Negative for chest pain, palpitations, claudication, leg swelling and PND.  Gastrointestinal: Negative.  Negative for heartburn, nausea, vomiting, abdominal pain, diarrhea, constipation, blood in stool and melena.  Genitourinary: Negative.  Negative for dysuria, urgency, frequency and hematuria.  Musculoskeletal: Negative for myalgias, joint pain and falls.  Skin: Negative.  Negative for itching and rash.  Neurological: Negative.  Negative for dizziness, tingling, tremors, sensory change, speech change, focal weakness, seizures, loss of consciousness, weakness and headaches.  Endo/Heme/Allergies: Negative.  Does not bruise/bleed easily.  Psychiatric/Behavioral: Negative for depression, suicidal ideas, memory loss and substance abuse. The patient is not nervous/anxious.   All other systems reviewed and are negative.  14 point ROS was done and is otherwise as detailed above or in HPI   PHYSICAL EXAMINATION: ECOG PERFORMANCE STATUS: 1 - Symptomatic but completely ambulatory   Vitals with BMI 02/21/2016  Height   Weight 140 lbs 6 oz  BMI   Systolic 591  Diastolic 76  Pulse 638  Respirations 18   Physical Exam  Constitutional: She is oriented to person, place, and time and well-developed, well-nourished, and in no  distress.  Wears glasses. In treatment chair.  HENT:  Head: Normocephalic and atraumatic.  Nose: Nose normal.  Mouth/Throat: Oropharynx is clear and moist. No oropharyngeal exudate.  Limited as to how far she can open her mouth, due to her mass.  Eyes: Conjunctivae and EOM are  normal. Pupils are equal, round, and reactive to light. Right eye exhibits no discharge. Left eye exhibits no discharge. No scleral icterus.  Neck: Normal range of motion. Neck supple. No tracheal deviation present. No thyromegaly present.  Large R sided neck mass that begins just lateral to the thyroid cartilage, slightly smaller XRT changes noted  Cardiovascular: Normal rate, regular rhythm and normal heart sounds.  Exam reveals no gallop and no friction rub.   No murmur heard. Pulmonary/Chest: Effort normal. She has no wheezes. She has no rales.  Occasional coarse BS  Abdominal: Soft. Bowel sounds are normal. She exhibits no distension and no mass. There is no tenderness. There is no rebound and no guarding.  Musculoskeletal: Normal range of motion. She exhibits no edema.  Lymphadenopathy:    She has no cervical adenopathy.  Neurological: She is alert and oriented to person, place, and time. She has normal reflexes. No cranial nerve deficit. Gait normal. Coordination normal.  Skin: Skin is warm and dry. No rash noted.  Psychiatric: Mood, memory, affect and judgment normal.  Nursing note and vitals reviewed.   LABORATORY DATA:  I have reviewed the data as listed  Lab Results  Component Value Date   WBC 4.5 02/21/2016   HGB 9.4* 02/21/2016   HCT 28.2* 02/21/2016   MCV 90.1 02/21/2016   PLT 420* 02/21/2016   CMP     Component Value Date/Time   NA 128* 02/21/2016 1023   K 4.4 02/21/2016 1023   CL 86* 02/21/2016 1023   CO2 29 02/21/2016 1023   GLUCOSE 102* 02/21/2016 1023   BUN 34* 02/21/2016 1023   CREATININE 1.29* 02/21/2016 1023   CALCIUM 9.1 02/21/2016 1023   PROT 7.0 02/21/2016 1023   ALBUMIN  3.4* 02/21/2016 1023   AST 21 02/21/2016 1023   ALT 17 02/21/2016 1023   ALKPHOS 89 02/21/2016 1023   BILITOT 0.3 02/21/2016 1023   GFRNONAA 44* 02/21/2016 1023   GFRAA 51* 02/21/2016 1023   RADIOGRAPHIC STUDIES:  I have personally reviewed the radiological images as listed and agreed with the findings in the report. CLINICAL DATA: Initial treatment strategy for squamous cell carcinoma of the right neck.  EXAM: NUCLEAR MEDICINE PET SKULL BASE TO THIGH  TECHNIQUE: 7.61 mCi F-18 FDG was injected intravenously. Full-ring PET imaging was performed from the skull base to thigh after the radiotracer. CT data was obtained and used for attenuation correction and anatomic localization.  FASTING BLOOD GLUCOSE: Value: 98 mg/dl  COMPARISON: CTs of the neck, 12/17/2015, and chest, abdomen and pelvis, 01/02/2015. Bone scan 01/03/2015.  FINDINGS: NECK  The dominant right neck mass is markedly hypermetabolic with an SUV max of 20.7. This mass measures approximately 7.0 x 6.8 cm on image 36. There are multiple additional hypermetabolic cervical lymph nodes bilaterally. These include a right level 4 node measuring 14 mm on image 43 (SUV max 9.9), and small left-sided level 2 (10 mm on image 28) and level 4 nodes on image 41 (measuring 7 and 10 mm, SUV max 11.1). The dominant right neck mass appears contiguous with the right lobe of the thyroid and maybe invading it.No lesions of the pharyngeal mucosal space are identified. There is stable mass effect on the larynx which is displaced to the left.  CHEST  There are no hypermetabolic mediastinal, hilar or axillary lymph nodes. There is no suspicious pulmonary activity. Emphysema is noted. The small ground-glass nodule previously noted in the right upper lobe measures 6 mm on image number 6,  too small to evaluate by PET-CT.  ABDOMEN/PELVIS  There is no hypermetabolic activity within the liver, adrenal glands, spleen or  pancreas. There is no hypermetabolic nodal activity. There is physiologic activity within the left anterior abdominal wall surrounding a percutaneous G-tube. There is a small amount of extraluminal air within the left upper quadrant of the abdomen and in the anterior abdominal wall, attributed to this G-tube which was placed 2 days ago. Cholelithiasis, mild aortoiliac atherosclerosis and sigmoid diverticulosis are noted.  SKELETON  There is no hypermetabolic activity to suggest osseous metastatic disease.  IMPRESSION: 1. The dominant right neck mass is hypermetabolic. In addition, there are hypermetabolic smaller cervical lymph nodes bilaterally. 2. No discrete lesion of the pharyngeal mucosal space identified. The dominant right neck mass and its hypermetabolic activity are contiguous with the right thyroid lobe and may be invading it. 3. No suspicious activity within the chest, abdomen or pelvis. 4. Small ground-glass nodule in the right upper lobe is too small to evaluate by PET-CT. Attention on follow-up recommended. 5. Small amount of extraluminal air in the upper abdomen attributed to recent G-tube placement.   Electronically Signed  By: Richardean Sale M.D.  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 Benjamine Frederick 12/21/2015 No obvious primary lesion noted on panendoscopy examination CT neck 12/17/2015 large infiltrative tumor mass extends to skin surface, infiltrates R patorid, R submandibular, R carotid sheath,Thrombosis of R IJ vein, multiple satellite nodules Centrilobular emphysema P16 negative Tobacco Use Tobacco Abuse Port a cath/FT placement on 01/17/2016 Multiple dental extractions with Dr. Enrique Frederick 01/13/3016 Hyponatremia HTN  Her renal function has clearly worsened since starting therapy and I  suspect based upon her sodium and recent K that her lisinopril/hctz has contributed to this. She has been asked on multiple occasions to discontinue this medication; I emphasized to her today that it is critical she stop her BP medication. We will monitor her BP moving forward and if necessary treat her with another agent.  She was given additional hydration today and was advised that she needs to come in daily for IV hydration this week.  The patient was here to recieve Cycle #2 Cisplatin today. Given the significant tumor burden she has I feel we should proceed with treatment, increasing her hydration, discontinuing her BP medication (HCTZ/lisinopril). If needed we can change her final treatment to carboplatin/5-FU. Weekly labs will be continued. She was encouraged to add free water via her FT or by mouth if able.   She did not need any prescription refills today.  She will return next week for a follow up.  Smoking cessation was addressed with the patient again today in detail. I have encouraged her to completely stop smoking.   All questions were answered. The patient knows to call the clinic with any problems, questions or concerns.  This document serves as a record of services personally performed by Ancil Linsey, MD. It was created on her behalf by Kandace Blitz, a trained medical scribe. The creation of this record is based on the scribe's personal observations and the provider's statements to them. This document has been checked and approved by the attending provider.  I have reviewed the above documentation for accuracy and completeness, and I agree with the above.  This note was electronically signed.  Molli Hazard, MD  02/21/2016 1:31 PM

## 2016-02-21 NOTE — Patient Instructions (Signed)
[facility] Discharge Instructions for Patients Receiving Chemotherapy   Beginning January 23rd 2017 lab work for the [facility] will be done in the  Main lab at [facility] on 1st floor. If you have a lab appointment with the [facility] please come in thru the  Main Entrance and check in at the main information desk   Today you received the following chemotherapy agent: Oxaliplatin.   Please be sure to stop taking your Lisinopril because it contains your hydrochlorothiazide.  This is important because it will further damage your kidneys if you continue to take it.      If you develop nausea and vomiting, or diarrhea that is not controlled by your medication, call the clinic.  The clinic phone number is [phone]. Office hours are Monday-Friday 8:30am-5:00pm.  BELOW ARE SYMPTOMS THAT SHOULD BE REPORTED IMMEDIATELY:  *FEVER GREATER THAN 101.0 F  *CHILLS WITH OR WITHOUT FEVER  NAUSEA AND VOMITING THAT IS NOT CONTROLLED WITH YOUR NAUSEA MEDICATION  *UNUSUAL SHORTNESS OF BREATH  *UNUSUAL BRUISING OR BLEEDING  TENDERNESS IN MOUTH AND THROAT WITH OR WITHOUT PRESENCE OF ULCERS  *URINARY PROBLEMS  *BOWEL PROBLEMS  UNUSUAL RASH Items with * indicate a potential emergency and should be followed up as soon as possible. If you have an emergency after office hours please contact your primary care physician or go to the nearest emergency department.  Please call the clinic during office hours if you have any questions or concerns.   You may also contact the Patient Navigator at [phone] should you have any questions or need assistance in obtaining follow up care.      Resources For Cancer Patients and their Caregivers ? American Cancer Society: Can assist with transportation, wigs, general needs, runs Look Good Feel Better.        ([phone] ? Cancer Care: Provides financial assistance, online support groups, medication/co-pay assistance.   1-800-813-HOPE 469-616-3381) ? [place] Assists [place] cancer patients and their families through emotional , educational and financial support.  210-865-6704 ? [place] DSS Where to apply for food stamps, Medicaid and utility assistance. (310) 051-2643 ? [facility]: Transportation to medical appointments. 801-417-6717 ? Social Security Administration: May apply for disability if have a Stage IV cancer. ([phone] [phone] ? [employer], Disability and Transit Services: Assists with nutrition, care and transit needs. [phone]

## 2016-02-22 ENCOUNTER — Encounter: Payer: Self-pay | Admitting: Dietician

## 2016-02-22 ENCOUNTER — Encounter (HOSPITAL_BASED_OUTPATIENT_CLINIC_OR_DEPARTMENT_OTHER): Payer: Medicaid Other

## 2016-02-22 VITALS — BP 147/79 | HR 80 | Temp 98.1°F | Resp 16 | Wt 145.0 lb

## 2016-02-22 DIAGNOSIS — R110 Nausea: Secondary | ICD-10-CM | POA: Diagnosis not present

## 2016-02-22 DIAGNOSIS — C760 Malignant neoplasm of head, face and neck: Secondary | ICD-10-CM

## 2016-02-22 MED ORDER — HEPARIN SOD (PORK) LOCK FLUSH 100 UNIT/ML IV SOLN
500.0000 [IU] | Freq: Once | INTRAVENOUS | Status: AC
  Administered 2016-02-22: 500 [IU] via INTRAVENOUS

## 2016-02-22 MED ORDER — LORAZEPAM 2 MG/ML IJ SOLN
INTRAMUSCULAR | Status: AC
  Filled 2016-02-22: qty 1

## 2016-02-22 MED ORDER — SODIUM CHLORIDE 0.9 % IV SOLN
INTRAVENOUS | Status: DC
Start: 2016-02-22 — End: 2016-02-22
  Administered 2016-02-22: 14:00:00 via INTRAVENOUS

## 2016-02-22 MED ORDER — LORAZEPAM 2 MG/ML IJ SOLN
0.5000 mg | Freq: Once | INTRAMUSCULAR | Status: AC
  Administered 2016-02-22: 0.5 mg via INTRAVENOUS

## 2016-02-22 MED ORDER — SODIUM CHLORIDE 0.9% FLUSH
10.0000 mL | Freq: Once | INTRAVENOUS | Status: AC
  Administered 2016-02-22: 10 mL via INTRAVENOUS

## 2016-02-22 MED ORDER — SODIUM CHLORIDE 0.9 % IV SOLN
10.0000 mg | Freq: Once | INTRAVENOUS | Status: AC
  Administered 2016-02-22: 10 mg via INTRAVENOUS
  Filled 2016-02-22: qty 1

## 2016-02-22 MED ORDER — HEPARIN SOD (PORK) LOCK FLUSH 100 UNIT/ML IV SOLN
INTRAVENOUS | Status: AC
  Filled 2016-02-22: qty 5

## 2016-02-22 NOTE — Progress Notes (Signed)
Following up with pt due to ongoing poor hydration/weight loss  Contacted Pt by visiting during IVF  Wt Readings from Last 10 Encounters:  02/22/16 145 lb (65.772 kg)  02/21/16 140 lb 6.4 oz (63.685 kg)  02/17/16 140 lb 6.4 oz (63.685 kg)  02/07/16 148 lb (67.132 kg)  01/31/16 146 lb 12.8 oz (66.588 kg)  01/21/16 150 lb (68.04 kg)  01/13/16 153 lb 2 oz (69.457 kg)  01/10/16 153 lb 2 oz (69.457 kg)  01/05/16 153 lb (69.4 kg)  12/28/15 153 lb 9.6 oz (69.673 kg)   Patient weight has increased by 5 lbs which was likely because pt was profoundly dehydrated.   Patient says that she has been able to do 4 cans each day w/ 2 syringe flushes before and after each can. She reports no tolerance issues. Her nausea is better controlled today then it was last week.   Pt still reports having significant oral intake. She eats ~1 can of soup a day as well as egg, pudding and fruit cups. She states some days are better than other regarding her swallowing.   RD reiterated that her regimen was 5 cans each day to meet 100% of her needs. However, given that she is eating ~1 cans worth, she can hold at 4 for right now. Instructed patient to increase her TF to 5 cans each day if her TF falls any further. Pt notes understanding.   Overall, pt still has pleasant, optimistic demeanor. She still has not met with SLP as she has "too much else going on" right now.   RD to continue to monitor weight.   Burtis Junes RD, LDN Nutrition Pager: [phone] 02/22/2016 4:02 PM

## 2016-02-22 NOTE — Progress Notes (Signed)
Tolerated IV fluids well. 

## 2016-02-22 NOTE — Progress Notes (Signed)
1440- C/O nausea.  Order obtained for Lorazepam 0.5mg  IV and Decadron 10mg  IVPB and given.

## 2016-02-22 NOTE — Patient Instructions (Signed)
[place] at [facility] Discharge Instructions  RECOMMENDATIONS MADE BY THE CONSULTANT AND ANY TEST RESULTS WILL BE SENT TO YOUR REFERRING PHYSICIAN.  IV fluids given as ordered.  Thank you for choosing [place] at [facility] to provide your oncology and hematology care.  To afford each patient quality time with our provider, please arrive at least 15 minutes before your scheduled appointment time.   Beginning January 23rd 2017 lab work for the [name] will be done in the  Main lab at [facility] on 1st floor. If you have a lab appointment with the [facility] please come in thru the  Main Entrance and check in at the main information desk  You need to re-schedule your appointment should you arrive 10 or more minutes late.  We strive to give you quality time with our providers, and arriving late affects you and other patients whose appointments are after yours.  Also, if you no show three or more times for appointments you may be dismissed from the clinic at the providers discretion.     Again, thank you for choosing [facility].  Our hope is that these requests will decrease the amount of time that you wait before being seen by our physicians.       _____________________________________________________________  Should you have questions after your visit to [facility], please contact our office at [phone] between the hours of 8:30 a.m. and 4:30 p.m.  Voicemails left after 4:30 p.m. will not be returned until the following business day.  For prescription refill requests, have your pharmacy contact our office.         Resources For Cancer Patients and their Caregivers ? American Cancer Society: Can assist with transportation, wigs, general needs, runs Look Good Feel Better.        6696596928 ? Cancer Care: Provides financial assistance, online support groups, medication/co-pay assistance.   1-800-813-HOPE (878)811-1508) ? [place] Assists [place] cancer patients and their families through emotional , educational and financial support.  2491808452 ? [place] DSS Where to apply for food stamps, Medicaid and utility assistance. 740 128 1313 ? [facility]: Transportation to medical appointments. (740) 463-3783 ? Social Security Administration: May apply for disability if have a Stage IV cancer. 514 121 9631 (405) 801-7921 ? [employer], Disability and Transit Services: Assists with nutrition, care and transit needs. 435-021-8808

## 2016-02-23 ENCOUNTER — Encounter (HOSPITAL_BASED_OUTPATIENT_CLINIC_OR_DEPARTMENT_OTHER): Payer: Medicaid Other

## 2016-02-23 VITALS — BP 145/85 | HR 98 | Temp 98.7°F | Resp 18

## 2016-02-23 DIAGNOSIS — C760 Malignant neoplasm of head, face and neck: Secondary | ICD-10-CM

## 2016-02-23 MED ORDER — SODIUM CHLORIDE 0.9 % IV SOLN
INTRAVENOUS | Status: AC
  Administered 2016-02-23: 12:00:00 via INTRAVENOUS

## 2016-02-23 MED ORDER — LORAZEPAM 2 MG/ML IJ SOLN
INTRAMUSCULAR | Status: AC
  Filled 2016-02-23: qty 1

## 2016-02-23 MED ORDER — SODIUM CHLORIDE 0.9% FLUSH
10.0000 mL | Freq: Once | INTRAVENOUS | Status: AC
  Administered 2016-02-23: 10 mL via INTRAVENOUS

## 2016-02-23 MED ORDER — LORAZEPAM 2 MG/ML IJ SOLN
0.5000 mg | Freq: Once | INTRAMUSCULAR | Status: AC
  Administered 2016-02-23: 0.5 mg via INTRAVENOUS

## 2016-02-23 MED ORDER — HEPARIN SOD (PORK) LOCK FLUSH 100 UNIT/ML IV SOLN
500.0000 [IU] | Freq: Once | INTRAVENOUS | Status: AC
  Administered 2016-02-23: 500 [IU] via INTRAVENOUS

## 2016-02-23 MED ORDER — SODIUM CHLORIDE 0.9 % IV SOLN
10.0000 mg | Freq: Once | INTRAVENOUS | Status: AC
  Administered 2016-02-23: 10 mg via INTRAVENOUS
  Filled 2016-02-23: qty 1

## 2016-02-23 NOTE — Patient Instructions (Signed)
[place] at [facility] Discharge Instructions  RECOMMENDATIONS MADE BY THE CONSULTANT AND ANY TEST RESULTS WILL BE SENT TO YOUR REFERRING PHYSICIAN.  IVF today.    Thank you for choosing [place] at [facility] to provide your oncology and hematology care.  To afford each patient quality time with our provider, please arrive at least 15 minutes before your scheduled appointment time.   Beginning January 23rd 2017 lab work for the [name] will be done in the  Main lab at [facility] on 1st floor. If you have a lab appointment with the [place] please come in thru the  Main Entrance and check in at the main information desk  You need to re-schedule your appointment should you arrive 10 or more minutes late.  We strive to give you quality time with our providers, and arriving late affects you and other patients whose appointments are after yours.  Also, if you no show three or more times for appointments you may be dismissed from the clinic at the providers discretion.     Again, thank you for choosing [facility].  Our hope is that these requests will decrease the amount of time that you wait before being seen by our physicians.       _____________________________________________________________  Should you have questions after your visit to [facility], please contact our office at [phone] between the hours of 8:30 a.m. and 4:30 p.m.  Voicemails left after 4:30 p.m. will not be returned until the following business day.  For prescription refill requests, have your pharmacy contact our office.         Resources For Cancer Patients and their Caregivers ? American Cancer Society: Can assist with transportation, wigs, general needs, runs Look Good Feel Better.        719-348-4753 ? Cancer Care: Provides financial assistance, online support groups, medication/co-pay assistance.  1-800-813-HOPE  [phone]) ? [place] Assists [place] cancer patients and their families through emotional , educational and financial support.  ([phone] 117-7869 ? [place] DSS Where to apply for food stamps, Medicaid and utility assistance. 417-088-6387 ? [employer]: Transportation to medical appointments. 513-546-0255 ? Social Security Administration: May apply for disability if have a Stage IV cancer. [phone] 647-008-2172 ? [employer], Disability and Transit Services: Assists with nutrition, care and transit needs. [phone]

## 2016-02-23 NOTE — Progress Notes (Signed)
Patient c/o nausea, MD aware and PA ordered antiemetics, given as ordered.   Patient tolerated infusion well.  VSS.

## 2016-02-24 ENCOUNTER — Ambulatory Visit (HOSPITAL_COMMUNITY): Payer: Self-pay

## 2016-02-24 ENCOUNTER — Encounter (HOSPITAL_BASED_OUTPATIENT_CLINIC_OR_DEPARTMENT_OTHER): Payer: Medicaid Other

## 2016-02-24 ENCOUNTER — Encounter (HOSPITAL_COMMUNITY): Payer: Self-pay

## 2016-02-24 VITALS — BP 144/85 | HR 93 | Temp 98.4°F | Resp 18

## 2016-02-24 DIAGNOSIS — R110 Nausea: Secondary | ICD-10-CM

## 2016-02-24 DIAGNOSIS — R197 Diarrhea, unspecified: Secondary | ICD-10-CM | POA: Diagnosis present

## 2016-02-24 DIAGNOSIS — C760 Malignant neoplasm of head, face and neck: Secondary | ICD-10-CM | POA: Diagnosis not present

## 2016-02-24 MED ORDER — LORAZEPAM 2 MG/ML IJ SOLN
0.5000 mg | Freq: Once | INTRAMUSCULAR | Status: AC
  Administered 2016-02-24: 0.5 mg via INTRAVENOUS

## 2016-02-24 MED ORDER — LOPERAMIDE HCL 2 MG PO CAPS
ORAL_CAPSULE | ORAL | Status: AC
  Filled 2016-02-24: qty 2

## 2016-02-24 MED ORDER — HEPARIN SOD (PORK) LOCK FLUSH 100 UNIT/ML IV SOLN
500.0000 [IU] | Freq: Once | INTRAVENOUS | Status: AC
  Administered 2016-02-24: 500 [IU] via INTRAVENOUS

## 2016-02-24 MED ORDER — LORAZEPAM 2 MG/ML IJ SOLN
INTRAMUSCULAR | Status: AC
  Filled 2016-02-24: qty 1

## 2016-02-24 MED ORDER — SODIUM CHLORIDE 0.9% FLUSH
10.0000 mL | INTRAVENOUS | Status: DC | PRN
  Administered 2016-02-24: 10 mL via INTRAVENOUS
  Filled 2016-02-24: qty 10

## 2016-02-24 MED ORDER — HEPARIN SOD (PORK) LOCK FLUSH 100 UNIT/ML IV SOLN
INTRAVENOUS | Status: AC
Start: 2016-02-24 — End: 2016-02-24
  Filled 2016-02-24: qty 5

## 2016-02-24 MED ORDER — LOPERAMIDE HCL 2 MG PO CAPS
4.0000 mg | ORAL_CAPSULE | ORAL | Status: DC | PRN
  Administered 2016-02-24: 4 mg via ORAL

## 2016-02-24 MED ORDER — SODIUM CHLORIDE 0.9 % IV SOLN
Freq: Once | INTRAVENOUS | Status: AC
  Administered 2016-02-24: 12:00:00 via INTRAVENOUS

## 2016-02-24 NOTE — Progress Notes (Signed)
Gina Frederick arrived for fluids. States she has slight nausea and diarrhea this morning. 2 loose stools. Orders obtained and imodium and lorazepam fiven.  Tolerated fluids well and with no further diarrhea or nausea

## 2016-02-24 NOTE — Patient Instructions (Signed)
[place] at [facility] Discharge Instructions  RECOMMENDATIONS MADE BY THE CONSULTANT AND ANY TEST RESULTS WILL BE SENT TO YOUR REFERRING PHYSICIAN.  IV fluids and nausea medicines with imodium today  Thank you for choosing [place] at [facility] to provide your oncology and hematology care.  To afford each patient quality time with our provider, please arrive at least 15 minutes before your scheduled appointment time.   Beginning January 23rd 2017 lab work for the [name] will be done in the  Main lab at [facility] on 1st floor. If you have a lab appointment with the [place] please come in thru the  Main Entrance and check in at the main information desk  You need to re-schedule your appointment should you arrive 10 or more minutes late.  We strive to give you quality time with our providers, and arriving late affects you and other patients whose appointments are after yours.  Also, if you no show three or more times for appointments you may be dismissed from the clinic at the providers discretion.     Again, thank you for choosing [facility].  Our hope is that these requests will decrease the amount of time that you wait before being seen by our physicians.       _____________________________________________________________  Should you have questions after your visit to [facility], please contact our office at [phone] between the hours of 8:30 a.m. and 4:30 p.m.  Voicemails left after 4:30 p.m. will not be returned until the following business day.  For prescription refill requests, have your pharmacy contact our office.         Resources For Cancer Patients and their Caregivers ? American Cancer Society: Can assist with transportation, wigs, general needs, runs Look Good Feel Better.        ([phone] ? Cancer Care: Provides financial assistance, online support groups,  medication/co-pay assistance.  1-800-813-HOPE 670-213-7556) ? [place] Assists [place] cancer patients and their families through emotional , educational and financial support.  956-668-5609 ? [place] DSS Where to apply for food stamps, Medicaid and utility assistance. 418-229-2096 ? RCATS: Transportation to medical appointments. 838-108-8681 ? Social Security Administration: May apply for disability if have a Stage IV cancer. [phone] ? [employer], Disability and Transit Services: Assists with nutrition, care and transit needs. [phone]

## 2016-02-25 ENCOUNTER — Encounter (HOSPITAL_BASED_OUTPATIENT_CLINIC_OR_DEPARTMENT_OTHER): Payer: Medicaid Other

## 2016-02-25 VITALS — BP 139/84 | HR 84 | Temp 98.7°F | Resp 18

## 2016-02-25 DIAGNOSIS — C760 Malignant neoplasm of head, face and neck: Secondary | ICD-10-CM | POA: Diagnosis not present

## 2016-02-25 DIAGNOSIS — R110 Nausea: Secondary | ICD-10-CM

## 2016-02-25 MED ORDER — HEPARIN SOD (PORK) LOCK FLUSH 100 UNIT/ML IV SOLN
500.0000 [IU] | Freq: Once | INTRAVENOUS | Status: AC
  Administered 2016-02-25: 500 [IU] via INTRAVENOUS

## 2016-02-25 MED ORDER — LORAZEPAM 2 MG/ML IJ SOLN
0.5000 mg | Freq: Once | INTRAMUSCULAR | Status: AC
  Administered 2016-02-25: 0.5 mg via INTRAVENOUS

## 2016-02-25 MED ORDER — SODIUM CHLORIDE 0.9% FLUSH
10.0000 mL | Freq: Once | INTRAVENOUS | Status: AC
  Administered 2016-02-25: 10 mL via INTRAVENOUS

## 2016-02-25 MED ORDER — SODIUM CHLORIDE 0.9 % IV SOLN
INTRAVENOUS | Status: AC
  Administered 2016-02-25: 12:00:00 via INTRAVENOUS

## 2016-02-25 MED ORDER — LORAZEPAM 2 MG/ML IJ SOLN
INTRAMUSCULAR | Status: AC
Start: 2016-02-25 — End: 2016-02-25
  Filled 2016-02-25: qty 1

## 2016-02-25 NOTE — Progress Notes (Signed)
Patient to clinic for IV fluids. Reports complaints of mild nausea.  Tolerated IV fluids well. Ambulatory on discharge home with friend.

## 2016-02-25 NOTE — Patient Instructions (Signed)
[place] at [facility] Discharge Instructions  RECOMMENDATIONS MADE BY THE CONSULTANT AND ANY TEST RESULTS WILL BE SENT TO YOUR REFERRING PHYSICIAN.  IV fluids given as ordered.  Thank you for choosing [place] at [facility] to provide your oncology and hematology care.  To afford each patient quality time with our provider, please arrive at least 15 minutes before your scheduled appointment time.   Beginning January 23rd 2017 lab work for the [name] will be done in the  Main lab at [facility] on 1st floor. If you have a lab appointment with the [place] please come in thru the  Main Entrance and check in at the main information desk  You need to re-schedule your appointment should you arrive 10 or more minutes late.  We strive to give you quality time with our providers, and arriving late affects you and other patients whose appointments are after yours.  Also, if you no show three or more times for appointments you may be dismissed from the clinic at the providers discretion.     Again, thank you for choosing [facility].  Our hope is that these requests will decrease the amount of time that you wait before being seen by our physicians.       _____________________________________________________________  Should you have questions after your visit to [facility], please contact our office at [phone] between the hours of 8:30 a.m. and 4:30 p.m.  Voicemails left after 4:30 p.m. will not be returned until the following business day.  For prescription refill requests, have your pharmacy contact our office.         Resources For Cancer Patients and their Caregivers ? American Cancer Society: Can assist with transportation, wigs, general needs, runs Look Good Feel Better.        [phone] ? Cancer Care: Provides financial assistance, online support groups, medication/co-pay assistance.   1-800-813-HOPE (337)008-7224) ? [place] Assists [place] cancer patients and their families through emotional , educational and financial support.  [phone] ? [place] DSS Where to apply for food stamps, Medicaid and utility assistance. ([phone] ? [employer]: Transportation to medical appointments. ([phone] ? Social Security Administration: May apply for disability if have a Stage IV cancer. [phone] [phone] ? [employer], [employer]ability and [employer] Services: Assists with nutrition, care and transit needs. [phone]

## 2016-02-28 ENCOUNTER — Encounter (HOSPITAL_COMMUNITY): Payer: Medicaid Other | Admitting: Hematology & Oncology

## 2016-02-28 ENCOUNTER — Encounter (HOSPITAL_BASED_OUTPATIENT_CLINIC_OR_DEPARTMENT_OTHER): Payer: Medicaid Other

## 2016-02-28 ENCOUNTER — Ambulatory Visit (HOSPITAL_COMMUNITY): Payer: Self-pay | Admitting: Hematology & Oncology

## 2016-02-28 ENCOUNTER — Inpatient Hospital Stay (HOSPITAL_COMMUNITY): Payer: Self-pay

## 2016-02-28 ENCOUNTER — Encounter (HOSPITAL_COMMUNITY): Payer: Self-pay | Admitting: Hematology & Oncology

## 2016-02-28 ENCOUNTER — Ambulatory Visit (HOSPITAL_COMMUNITY): Payer: Self-pay

## 2016-02-28 VITALS — BP 142/82 | HR 97 | Temp 98.8°F | Resp 16

## 2016-02-28 VITALS — BP 145/95 | HR 106 | Temp 98.2°F | Resp 18 | Wt 143.7 lb

## 2016-02-28 DIAGNOSIS — I10 Essential (primary) hypertension: Secondary | ICD-10-CM

## 2016-02-28 DIAGNOSIS — J432 Centrilobular emphysema: Secondary | ICD-10-CM | POA: Diagnosis not present

## 2016-02-28 DIAGNOSIS — D649 Anemia, unspecified: Secondary | ICD-10-CM

## 2016-02-28 DIAGNOSIS — R110 Nausea: Secondary | ICD-10-CM

## 2016-02-28 DIAGNOSIS — E871 Hypo-osmolality and hyponatremia: Secondary | ICD-10-CM

## 2016-02-28 DIAGNOSIS — Z720 Tobacco use: Secondary | ICD-10-CM | POA: Diagnosis not present

## 2016-02-28 DIAGNOSIS — C760 Malignant neoplasm of head, face and neck: Secondary | ICD-10-CM

## 2016-02-28 DIAGNOSIS — G893 Neoplasm related pain (acute) (chronic): Secondary | ICD-10-CM

## 2016-02-28 MED ORDER — LORAZEPAM 2 MG/ML IJ SOLN
0.5000 mg | Freq: Once | INTRAMUSCULAR | Status: AC
  Administered 2016-02-28: 0.5 mg via INTRAVENOUS

## 2016-02-28 MED ORDER — PALONOSETRON HCL INJECTION 0.25 MG/5ML
INTRAVENOUS | Status: AC
  Filled 2016-02-28: qty 5

## 2016-02-28 MED ORDER — PALONOSETRON HCL INJECTION 0.25 MG/5ML
0.2500 mg | Freq: Once | INTRAVENOUS | Status: AC
  Administered 2016-02-28: 0.25 mg via INTRAVENOUS

## 2016-02-28 MED ORDER — PROCHLORPERAZINE MALEATE 10 MG PO TABS: 10.0000 mg | ORAL_TABLET | Freq: Four times a day (QID) | ORAL | Status: DC | PRN

## 2016-02-28 MED ORDER — FENTANYL 25 MCG/HR TD PT72: 25.0000 ug | MEDICATED_PATCH | TRANSDERMAL | Status: DC

## 2016-02-28 MED ORDER — MORPHINE SULFATE 10 MG/5ML PO SOLN: 5.0000 mg | ORAL | Status: DC | PRN

## 2016-02-28 MED ORDER — HEPARIN SOD (PORK) LOCK FLUSH 100 UNIT/ML IV SOLN
INTRAVENOUS | Status: AC
  Filled 2016-02-28: qty 5

## 2016-02-28 MED ORDER — LORAZEPAM 0.5 MG PO TABS: ORAL_TABLET | ORAL | Status: DC

## 2016-02-28 MED ORDER — SODIUM CHLORIDE 0.9 % IV SOLN
INTRAVENOUS | Status: AC
  Administered 2016-02-28: 11:00:00 via INTRAVENOUS

## 2016-02-28 MED ORDER — SODIUM CHLORIDE 0.9% FLUSH
10.0000 mL | Freq: Once | INTRAVENOUS | Status: AC
  Administered 2016-02-28: 10 mL via INTRAVENOUS

## 2016-02-28 MED ORDER — ZOLPIDEM TARTRATE 10 MG PO TABS: 10.0000 mg | ORAL_TABLET | Freq: Every evening | ORAL | Status: DC | PRN

## 2016-02-28 MED ORDER — HEPARIN SOD (PORK) LOCK FLUSH 100 UNIT/ML IV SOLN
500.0000 [IU] | Freq: Once | INTRAVENOUS | Status: AC
  Administered 2016-02-28: 500 [IU] via INTRAVENOUS

## 2016-02-28 MED ORDER — LORAZEPAM 2 MG/ML IJ SOLN
INTRAMUSCULAR | Status: AC
  Filled 2016-02-28: qty 1

## 2016-02-28 NOTE — Progress Notes (Signed)
Tolerated IV fluids well. Denies nausea at discharge. Ambulatory on discharge home with friend.

## 2016-02-28 NOTE — Patient Instructions (Addendum)
[place] at [facility] Discharge Instructions  RECOMMENDATIONS MADE BY THE CONSULTANT AND ANY TEST RESULTS WILL BE SENT TO YOUR REFERRING PHYSICIAN.   Exam and discussion by Dr Whitney Muse today Refill on ativan, ambien, and compazine Fluids today Fentanyl pain medication in 3 days you change the patch, you can also take the liquid morphine we gave you for pain also.  When you come back in for fluids please let us know how your pain is. Return to see the doctor as scheduled  Please call the clinic if you have any questions or concerns     Thank you for choosing [place] at [facility] to provide your oncology and hematology care.  To afford each patient quality time with our provider, please arrive at least 15 minutes before your scheduled appointment time.   Beginning January 23rd 2017 lab work for the [name] will be done in the  Main lab at [facility] on 1st floor. If you have a lab appointment with the [place] please come in thru the  Main Entrance and check in at the main information desk  You need to re-schedule your appointment should you arrive 10 or more minutes late.  We strive to give you quality time with our providers, and arriving late affects you and other patients whose appointments are after yours.  Also, if you no show three or more times for appointments you may be dismissed from the clinic at the providers discretion.     Again, thank you for choosing [facility].  Our hope is that these requests will decrease the amount of time that you wait before being seen by our physicians.       _____________________________________________________________  Should you have questions after your visit to [facility], please contact our office at [phone] between the hours of 8:30 a.m. and 4:30 p.m.  Voicemails left after 4:30 p.m. will not be returned until the following business day.  For  prescription refill requests, have your pharmacy contact our office.         Resources For Cancer Patients and their Caregivers ? American Cancer Society: Can assist with transportation, wigs, general needs, runs Look Good Feel Better.        [phone] ? Cancer Care: Provides financial assistance, online support groups, medication/co-pay assistance.  1-800-813-HOPE 747-338-6426) ? [place] Assists [place] cancer patients and their families through emotional , educational and financial support.  831-593-4151 ? [place] DSS Where to apply for food stamps, Medicaid and utility assistance. 236-384-3590 ? [employer]: Transportation to medical appointments. [phone] ? Social Security Administration: May apply for disability if have a Stage IV cancer. [phone] ? [employer], [employer]ability and [employer] Services: Assists with nutrition, care and transit needs. [phone]

## 2016-02-28 NOTE — Progress Notes (Signed)
[place] at [facility] Progress Note  Patient Care Team: Noel Journey. 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 Benjamine Mola 12/21/2015 No obvious primary lesion noted on panendoscopy examination CT neck 12/17/2015 large infiltrative tumor mass extends to skin surface, infiltrates R patorid, R submandibular, R carotid sheath,Thrombosis of R IJ vein, multiple satellite nodules Centrilobular emphysema P16 negative Tobacco Abuse Port a cath/FT placement on 01/17/2016 Multiple dental extractions with Dr. Enrique Sack 01/13/3016   HISTORY OF PRESENTING ILLNESS:  Gina Frederick 60 y.o. 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   [name] was here today with a family member.   She is set up to receive fluids daily now. She has difficulty tolerating oral intake and at times her TF because the nausea is "ever present" She said that it is "all I can do not to spit" She has constant thick secretions and this gives her a bad taste in her mouth. She does a salt water rinses every day and this helps but it comes right back afterwards.  Most of her pain comes from the back of her throat when she tries to spit out her secretions. The number of pain pills she takes depends on how badly she feels during that day. She does note that her pills are getting harder to swallow. She is finally agreeable to changing her pain medication.  She said that this past week has been horrible because of her nausea. She was able to get 5 glasses of water down on Saturday. Sunday she had constant nausea but did not throw up. She said  that the only thing that occasionally helps her nausea is being still and putting a cold rag on her head.   Her feeding tube has been okay other than the night before last when she threw up one time after putting food in it. This was the first time she threw up.  She stopped taking her blood pressure medication as recommended.   She needs refills on her Ambien and Ativan.  MEDICAL HISTORY:  Past Medical History  Diagnosis Date  . Hypertension   . Arthritis     hands  . Neck mass     right neck(surfaced 11'16,then progressively enlarged"- denies swallwing or breathing difficulty.  . Hyperlipidemia   . Cancer ([place])     dx. squamous cell carcinoma-right neck    SURGICAL HISTORY: Past Surgical History  Procedure Laterality Date  . Tonsillectomy    . Knee arthroscopy      right  . Cesarean section    . Mass biopsy Right 12/21/2015    Procedure: NECK MASS BIOPSY;  Surgeon: Leta Baptist, MD;  Location: [place];  Service: ENT;  Laterality: Right;  . Panendoscopy N/A 12/21/2015    Procedure: PANENDOSCOPY;  Surgeon: Leta Baptist, MD;  Location: [place];  Service: ENT;  Laterality: N/A;  . Multiple extractions with alveoloplasty N/A 01/13/2016    Procedure: EXTRACTION OF TOOTH #'S 6-12,15, 19-28, 30 WITH ALVEOLOPLASTY AND BILATERAL MAXILLARY TUBEROSITY REDUCTIONS;  Surgeon: Lenn Cal, DDS;  Location: [facility];  Service: Oral Surgery;  Laterality: N/A;    SOCIAL HISTORY: Social History  Social History  . Marital Status: Divorced    Spouse Name: N/A  . Number of Children: 1  . Years of Education: N/A   Occupational History  . Not on file.   Social History Main Topics  . Smoking status: Current Every Day Smoker -- 0.25 packs/day    Types: Cigarettes  . Smokeless tobacco: Never Used     Comment: cut back to 1 cigarette per day  . Alcohol Use: No  . Drug Use: No  . Sexual Activity: No   Other Topics Concern  . Not on file   Social History  Narrative   She is divorced. One daughter, no grandchildren Smokes, notes that she is down to 3 a day. Began smoking "probably in her 22's." She says she has quit "on and off through the years." Denies problems with EtOH. 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 Quit working when her daughter was "of age to go to school," then stayed home with her daughter. For hobbies, she says she likes to read.  Is currently the caregiver for a lady with diabetes.  She does light housekeeping and keeps her on her medications, and cooks. The son of the woman she cares for, Herbie Baltimore, notes that her good cooking has brought his momma's blood pressure and blood sugar down.  She notes she has a good support system with her sister, and with the son of the woman she takes care of.  FAMILY HISTORY: Family History  Problem Relation Age of Onset  . Alzheimer's disease Mother   . Congestive Heart Failure Father    indicated that her mother is deceased. She indicated that her father is deceased.   Mother and father are deceased. Mother was 89; died of Alzheimer's. Father was 54; congestive heart failure. Has a sister, age 40 this year. Had a brother, deceased. He took his own life. 4 nieces and nephews.  ALLERGIES:  is allergic to codeine.  MEDICATIONS:  Current Outpatient Prescriptions  Medication Sig Dispense Refill  . acetaminophen (TYLENOL) 500 MG tablet Take 500 mg by mouth every 6 (six) hours as needed.    Marland Kitchen CISPLATIN IV Inject into the vein. Reported on 01/21/2016    . diphenhydrAMINE (BENADRYL) 25 MG tablet Take 25 mg by mouth every 6 (six) hours as needed.    . lidocaine-prilocaine (EMLA) cream Apply a quarter size amount to port site 1 hour prior to chemo. Do not rub in. Cover with plastic wrap. 30 g 3  . lisinopril-hydrochlorothiazide (PRINZIDE,ZESTORETIC) 20-12.5 MG tablet Take 1 tablet by mouth daily. Reported on  02/10/2016    . LORazepam (ATIVAN) 0.5 MG tablet Take 1 tablet every 4-6 hours as needed for nausea/vomiting. 45 tablet 0  . lovastatin (MEVACOR) 20 MG tablet Take 20 mg by mouth at bedtime. Reported on 02/17/2016    . magnesium oxide (MAG-OX) 400 (241.3 Mg) MG tablet Take 1 tablet (400 mg total) by mouth 2 (two) times daily. 60 tablet 3  . naproxen sodium (ANAPROX) 220 MG tablet Take 220 mg by mouth 2 (two) times daily as needed (Pain). Reported on 02/17/2016    . ondansetron (ZOFRAN) 8 MG tablet TAKE 1 TABLET EVERY 8 HOURS AS NEEDED FOR NAUSEA AND VOMITING 30 tablet 6  . oxyCODONE-acetaminophen (PERCOCET) 10-325 MG tablet Take every 2-4 hours as needed for pain 60 tablet 0  . prochlorperazine (COMPAZINE) 10 MG tablet Take 1 tablet (10 mg total) by  mouth every 6 (six) hours as needed for nausea or vomiting. 30 tablet 2  . Tetrahydrozoline-Zn Sulfate (EYE DROPS ALLERGY RELIEF OP) Apply 1 drop to eye 2 (two) times daily as needed (Itching eyes).    Marland Kitchen zolpidem (AMBIEN) 10 MG tablet Take 1 tablet (10 mg total) by mouth at bedtime as needed for sleep. 30 tablet 0  . fentaNYL (DURAGESIC) 25 MCG/HR patch Place 1 patch (25 mcg total) onto the skin every 3 (three) days. 10 patch 0  . morphine 10 MG/5ML solution Take 2.5 mLs (5 mg total) by mouth every 2 (two) hours as needed for severe pain. 100 mL 0  . potassium chloride (KLOR-CON) 20 MEQ packet Take 40 mEq by mouth 2 (two) times daily. (Patient not taking: Reported on 02/28/2016) 100 packet 1   No current facility-administered medications for this visit.   Facility-Administered Medications Ordered in Other Visits  Medication Dose Route Frequency Provider Last Rate Last Dose  . 0.9 %  sodium chloride infusion   Intravenous Continuous Gina Ranks, MD 333 mL/hr at 02/28/16 1047    . heparin lock flush 100 unit/mL  500 Units Intravenous Once Gina Ranks, MD      . sodium polystyrene (KAYEXALATE) 15 GM/60ML suspension 30 g  30 g Oral Once Baird Cancer, PA-C        Review of Systems  Constitutional: Negative for fever, chills and malaise/fatigue.  HENT: Negative for congestion, hearing loss, nosebleeds, sore throat and tinnitus.   Eyes: Negative.  Negative for blurred vision, double vision, pain and discharge.  Respiratory: Positive for sputum production. Negative for hemoptysis, shortness of breath and wheezing.        Constant thick secretions; Salt water rinse helps.   Cardiovascular: Negative.  Negative for chest pain, palpitations, claudication, leg swelling and PND.  Gastrointestinal: Positive for nausea. Negative for heartburn, vomiting, abdominal pain, diarrhea, constipation, blood in stool and melena.       Nausea is "ever present" Has only thrown up once after putting food in her feeding tube. A cold rag and being still helps.   Genitourinary: Negative.  Negative for dysuria, urgency, frequency and hematuria.  Musculoskeletal: Negative for myalgias, joint pain and falls.  Skin: Negative.  Negative for itching and rash.  Neurological: Negative.  Negative for dizziness, tingling, tremors, sensory change, speech change, focal weakness, seizures, loss of consciousness, weakness and headaches.  Endo/Heme/Allergies: Negative.  Does not bruise/bleed easily.  Psychiatric/Behavioral: Negative for depression, suicidal ideas, memory loss and substance abuse. The patient is not nervous/anxious.   All other systems reviewed and are negative.  14 point ROS was done and is otherwise as detailed above or in HPI   PHYSICAL EXAMINATION: ECOG PERFORMANCE STATUS: 1 - Symptomatic but completely ambulatory   Vitals with BMI 02/28/2016  Height   Weight 143 lbs 11 oz  BMI   Systolic [id number]  Diastolic 95  Pulse [id number]  Respirations 18    Physical Exam  Constitutional: She is oriented to person, place, and time and well-developed, well-nourished, and in no distress.  Wears glasses.   HENT:  Head: Normocephalic and atraumatic.  Nose: Nose  normal.  Mouth/Throat: No oropharyngeal exudate.  Limited as to how far she can open her mouth, due to her mass. Mild oropharyngeal mucositis noted  Eyes: Conjunctivae and EOM are normal. Pupils are equal, round, and reactive to light. Right eye exhibits no discharge. Left eye exhibits no discharge. No scleral icterus.  Neck: Normal range of  motion. Neck supple. No tracheal deviation present. No thyromegaly present.  Neck mass still noted, smaller, significant XRT changes L neck greater than R  Cardiovascular: Normal rate, regular rhythm and normal heart sounds.  Exam reveals no gallop and no friction rub.   No murmur heard. Pulmonary/Chest: Effort normal. She has no wheezes. She has no rales.  Occasional coarse BS  Abdominal: Soft. Bowel sounds are normal. She exhibits no distension and no mass. There is no tenderness. There is no rebound and no guarding.  FT C/D/I  Musculoskeletal: Normal range of motion. She exhibits no edema.  Lymphadenopathy:    She has no cervical adenopathy.  Neurological: She is alert and oriented to person, place, and time. She has normal reflexes. No cranial nerve deficit. Gait normal. Coordination normal.  Skin: Skin is warm and dry. No rash noted.  Psychiatric: Mood, memory, affect and judgment normal.  Nursing note and vitals reviewed.   LABORATORY DATA:  I have reviewed the data as listed  Lab Results  Component Value Date   WBC 4.5 02/21/2016   HGB 9.4* 02/21/2016   HCT 28.2* 02/21/2016   MCV 90.1 02/21/2016   PLT 420* 02/21/2016   CMP     Component Value Date/Time   NA 128* 02/21/2016 1023   K 4.4 02/21/2016 1023   CL 86* 02/21/2016 1023   CO2 29 02/21/2016 1023   GLUCOSE 102* 02/21/2016 1023   BUN 34* 02/21/2016 1023   CREATININE 1.29* 02/21/2016 1023   CALCIUM 9.1 02/21/2016 1023   PROT 7.0 02/21/2016 1023   ALBUMIN 3.4* 02/21/2016 1023   AST 21 02/21/2016 1023   ALT 17 02/21/2016 1023   ALKPHOS 89 02/21/2016 1023   BILITOT 0.3  02/21/2016 1023   GFRNONAA 44* 02/21/2016 1023   GFRAA 51* 02/21/2016 1023    RADIOGRAPHIC STUDIES: I have personally reviewed the radiological images as listed and agreed with the findings in the report.  CLINICAL DATA: Initial treatment strategy for squamous cell carcinoma of the right neck.  EXAM: NUCLEAR MEDICINE PET SKULL BASE TO THIGH  TECHNIQUE: 7.61 mCi F-18 FDG was injected intravenously. Full-ring PET imaging was performed from the skull base to thigh after the radiotracer. CT data was obtained and used for attenuation correction and anatomic localization.  FASTING BLOOD GLUCOSE: Value: 98 mg/dl  COMPARISON: CTs of the neck, 12/17/2015, and chest, abdomen and pelvis, 01/02/2015. Bone scan 01/03/2015.  FINDINGS: NECK  The dominant right neck mass is markedly hypermetabolic with an SUV max of 20.7. This mass measures approximately 7.0 x 6.8 cm on image 36. There are multiple additional hypermetabolic cervical lymph nodes bilaterally. These include a right level 4 node measuring 14 mm on image 43 (SUV max 9.9), and small left-sided level 2 (10 mm on image 28) and level 4 nodes on image 41 (measuring 7 and 10 mm, SUV max 11.1). The dominant right neck mass appears contiguous with the right lobe of the thyroid and maybe invading it.No lesions of the pharyngeal mucosal space are identified. There is stable mass effect on the larynx which is displaced to the left.  CHEST  There are no hypermetabolic mediastinal, hilar or axillary lymph nodes. There is no suspicious pulmonary activity. Emphysema is noted. The small ground-glass nodule previously noted in the right upper lobe measures 6 mm on image number 6, too small to evaluate by PET-CT.  ABDOMEN/PELVIS  There is no hypermetabolic activity within the liver, adrenal glands, spleen or pancreas. There is no hypermetabolic nodal activity.  There is physiologic activity within the left anterior abdominal  wall surrounding a percutaneous G-tube. There is a small amount of extraluminal air within the left upper quadrant of the abdomen and in the anterior abdominal wall, attributed to this G-tube which was placed 2 days ago. Cholelithiasis, mild aortoiliac atherosclerosis and sigmoid diverticulosis are noted.  SKELETON  There is no hypermetabolic activity to suggest osseous metastatic disease.  IMPRESSION: 1. The dominant right neck mass is hypermetabolic. In addition, there are hypermetabolic smaller cervical lymph nodes bilaterally. 2. No discrete lesion of the pharyngeal mucosal space identified. The dominant right neck mass and its hypermetabolic activity are contiguous with the right thyroid lobe and may be invading it. 3. No suspicious activity within the chest, abdomen or pelvis. 4. Small ground-glass nodule in the right upper lobe is too small to evaluate by PET-CT. Attention on follow-up recommended. 5. Small amount of extraluminal air in the upper abdomen attributed to recent G-tube placement.   Electronically Signed  By: Richardean Sale M.D.  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 Benjamine Mola 12/21/2015 No obvious primary lesion noted on panendoscopy examination CT neck 12/17/2015 large infiltrative tumor mass extends to skin surface, infiltrates R patorid, R submandibular, R carotid sheath,Thrombosis of R IJ vein, multiple satellite nodules Centrilobular emphysema P16 negative Tobacco Use Tobacco Abuse Port a cath/FT placement on 01/17/2016 Multiple dental extractions with Dr. Enrique Sack 01/13/3016 Hyponatremia HTN  I have added a fentanyl patch to her pain regimen and transitioned her to roxanol given her difficulty swallowing tablets. She was educated on how to use short acting vs. Long acting pain  medication. Her pain will be reassessed when she returns for fluids on Wednesday.   She is going to receive fluids today. She is scheduled for IVF daily. I have given her aloxi and ativan IV today as well for her nausea.    She stopped taking her blood pressure medication, BP is acceptable today and will continue to be monitored.   She needs refills on her Ambien and Ativan.  She will return in a week for a follow up.   All questions were answered. The patient knows to call the clinic with any problems, questions or concerns.  This document serves as a record of services personally performed by Ancil Linsey, MD. It was created on her behalf by Kandace Blitz, a trained medical scribe. The creation of this record is based on the scribe's personal observations and the provider's statements to them. This document has been checked and approved by the attending provider.  I have reviewed the above documentation for accuracy and completeness, and I agree with the above.  This note was electronically signed.  Molli Hazard, MD  02/28/2016 12:58 PM

## 2016-02-29 ENCOUNTER — Encounter (HOSPITAL_BASED_OUTPATIENT_CLINIC_OR_DEPARTMENT_OTHER): Payer: Medicaid Other

## 2016-02-29 VITALS — BP 149/93 | HR 101 | Temp 97.9°F | Resp 18

## 2016-02-29 DIAGNOSIS — C760 Malignant neoplasm of head, face and neck: Secondary | ICD-10-CM

## 2016-02-29 DIAGNOSIS — R110 Nausea: Secondary | ICD-10-CM | POA: Diagnosis not present

## 2016-02-29 MED ORDER — HEPARIN SOD (PORK) LOCK FLUSH 100 UNIT/ML IV SOLN
500.0000 [IU] | Freq: Once | INTRAVENOUS | Status: AC
Start: 2016-02-29 — End: 2016-02-29
  Administered 2016-02-29: 500 [IU] via INTRAVENOUS

## 2016-02-29 MED ORDER — SODIUM CHLORIDE 0.9 % IV SOLN
INTRAVENOUS | Status: AC
  Administered 2016-02-29: 12:00:00 via INTRAVENOUS

## 2016-02-29 MED ORDER — HEPARIN SOD (PORK) LOCK FLUSH 100 UNIT/ML IV SOLN
INTRAVENOUS | Status: AC
  Filled 2016-02-29: qty 5

## 2016-02-29 MED ORDER — SODIUM CHLORIDE 0.9% FLUSH
10.0000 mL | Freq: Once | INTRAVENOUS | Status: AC
  Administered 2016-02-29: 10 mL via INTRAVENOUS

## 2016-02-29 NOTE — Progress Notes (Signed)
Patient reports she feels much better today and slept well last night for the first time in a long time.  Tolerated IV fluids well. Ambulatory on discharge home with friend.

## 2016-02-29 NOTE — Patient Instructions (Signed)
[place] at [facility] Discharge Instructions  RECOMMENDATIONS MADE BY THE CONSULTANT AND ANY TEST RESULTS WILL BE SENT TO YOUR REFERRING PHYSICIAN.  IV fluids given today as ordered.  Thank you for choosing [place] at [facility] to provide your oncology and hematology care.  To afford each patient quality time with our provider, please arrive at least 15 minutes before your scheduled appointment time.   Beginning January 23rd 2017 lab work for the [name] will be done in the  Main lab at [facility] on 1st floor. If you have a lab appointment with the [place] please come in thru the  Main Entrance and check in at the main information desk  You need to re-schedule your appointment should you arrive 10 or more minutes late.  We strive to give you quality time with our providers, and arriving late affects you and other patients whose appointments are after yours.  Also, if you no show three or more times for appointments you may be dismissed from the clinic at the providers discretion.     Again, thank you for choosing [facility].  Our hope is that these requests will decrease the amount of time that you wait before being seen by our physicians.       _____________________________________________________________  Should you have questions after your visit to [facility], please contact our office at [phone] between the hours of 8:30 a.m. and 4:30 p.m.  Voicemails left after 4:30 p.m. will not be returned until the following business day.  For prescription refill requests, have your pharmacy contact our office.         Resources For Cancer Patients and their Caregivers ? American Cancer Society: Can assist with transportation, wigs, general needs, runs Look Good Feel Better.        386-538-1521 ? Cancer Care: Provides financial assistance, online support groups, medication/co-pay assistance.   1-800-813-HOPE 949 198 6296) ? [place] Assists [place] cancer patients and their families through emotional , educational and financial support.  682 228 0939 ? [place] DSS Where to apply for food stamps, Medicaid and utility assistance. 463 193 2182 ? [facility]: Transportation to medical appointments. 615 326 5085 ? Social Security Administration: May apply for disability if have a Stage IV cancer. 440 516 9784 980-655-0336 ? [employer], Disability and Transit Services: Assists with nutrition, care and transit needs. (301)045-9779

## 2016-03-01 ENCOUNTER — Encounter (HOSPITAL_BASED_OUTPATIENT_CLINIC_OR_DEPARTMENT_OTHER): Payer: Medicaid Other

## 2016-03-01 VITALS — BP 134/81 | HR 106 | Temp 98.2°F | Resp 18

## 2016-03-01 DIAGNOSIS — R110 Nausea: Secondary | ICD-10-CM

## 2016-03-01 DIAGNOSIS — C760 Malignant neoplasm of head, face and neck: Secondary | ICD-10-CM | POA: Diagnosis not present

## 2016-03-01 MED ORDER — LORAZEPAM 2 MG/ML IJ SOLN
0.5000 mg | INTRAMUSCULAR | Status: AC
  Administered 2016-03-01: 0.5 mg via INTRAVENOUS

## 2016-03-01 MED ORDER — LORAZEPAM 2 MG/ML IJ SOLN
INTRAMUSCULAR | Status: AC
  Filled 2016-03-01: qty 1

## 2016-03-01 MED ORDER — SODIUM CHLORIDE 0.9 % IV SOLN
INTRAVENOUS | Status: AC
  Administered 2016-03-01: 12:00:00 via INTRAVENOUS

## 2016-03-01 MED ORDER — HEPARIN SOD (PORK) LOCK FLUSH 100 UNIT/ML IV SOLN
500.0000 [IU] | Freq: Once | INTRAVENOUS | Status: AC
  Administered 2016-03-01: 500 [IU] via INTRAVENOUS

## 2016-03-01 MED ORDER — SODIUM CHLORIDE 0.9 % IV SOLN: INTRAVENOUS | Status: DC

## 2016-03-01 MED ORDER — SODIUM CHLORIDE 0.9% FLUSH
10.0000 mL | Freq: Once | INTRAVENOUS | Status: AC
  Administered 2016-03-01: 10 mL via INTRAVENOUS

## 2016-03-01 NOTE — Patient Instructions (Signed)
[place] at [facility] Discharge Instructions  RECOMMENDATIONS MADE BY THE CONSULTANT AND ANY TEST RESULTS WILL BE SENT TO YOUR REFERRING PHYSICIAN.  IV fluids given as ordered.  Thank you for choosing [place] at [facility] to provide your oncology and hematology care.  To afford each patient quality time with our provider, please arrive at least 15 minutes before your scheduled appointment time.   Beginning January 23rd 2017 lab work for the [name] will be done in the  Main lab at [facility] on 1st floor. If you have a lab appointment with the [place] please come in thru the  Main Entrance and check in at the main information desk  You need to re-schedule your appointment should you arrive 10 or more minutes late.  We strive to give you quality time with our providers, and arriving late affects you and other patients whose appointments are after yours.  Also, if you no show three or more times for appointments you may be dismissed from the clinic at the providers discretion.     Again, thank you for choosing [facility].  Our hope is that these requests will decrease the amount of time that you wait before being seen by our physicians.       _____________________________________________________________  Should you have questions after your visit to [facility], please contact our office at [phone] between the hours of 8:30 a.m. and 4:30 p.m.  Voicemails left after 4:30 p.m. will not be returned until the following business day.  For prescription refill requests, have your pharmacy contact our office.         Resources For Cancer Patients and their Caregivers ? American Cancer Society: Can assist with transportation, wigs, general needs, runs Look Good Feel Better.        (425)144-0943 ? Cancer Care: Provides financial assistance, online support groups, medication/co-pay assistance.   1-800-813-HOPE 253-253-6065) ? [place] Assists [place] cancer patients and their families through emotional , educational and financial support.  (337) 300-1105 ? [place] DSS Where to apply for food stamps, Medicaid and utility assistance. (340) 477-6279 ? [facility]: Transportation to medical appointments. (916)674-7360 ? Social Security Administration: May apply for disability if have a Stage IV cancer. 779-254-5757 (906)704-3009 ? [employer], Disability and Transit Services: Assists with nutrition, care and transit needs. ([phone] [phone]

## 2016-03-01 NOTE — Progress Notes (Signed)
Tolerated IV fluids well. Denies any complaints on discharge. Ambulatory on discharge home with friend.

## 2016-03-02 ENCOUNTER — Ambulatory Visit (HOSPITAL_COMMUNITY): Payer: Self-pay

## 2016-03-02 ENCOUNTER — Encounter (HOSPITAL_BASED_OUTPATIENT_CLINIC_OR_DEPARTMENT_OTHER): Payer: Medicaid Other

## 2016-03-02 VITALS — BP 141/86 | HR 111 | Temp 98.1°F | Resp 18 | Wt 143.4 lb

## 2016-03-02 DIAGNOSIS — C760 Malignant neoplasm of head, face and neck: Secondary | ICD-10-CM

## 2016-03-02 DIAGNOSIS — R110 Nausea: Secondary | ICD-10-CM | POA: Diagnosis present

## 2016-03-02 LAB — CBC WITH DIFFERENTIAL/PLATELET
Basophils Absolute: 0 10*3/uL (ref 0.0–0.1)
Basophils Relative: 0 %
Eosinophils Absolute: 0 10*3/uL (ref 0.0–0.7)
Eosinophils Relative: 1 %
HEMATOCRIT: 20.9 % — AB (ref 36.0–46.0)
Hemoglobin: 7.1 g/dL — ABNORMAL LOW (ref 12.0–15.0)
LYMPHS ABS: 0.2 10*3/uL — AB (ref 0.7–4.0)
LYMPHS PCT: 8 %
MCH: 30.5 pg (ref 26.0–34.0)
MCHC: 34 g/dL (ref 30.0–36.0)
MCV: 89.7 fL (ref 78.0–100.0)
MONO ABS: 0.6 10*3/uL (ref 0.1–1.0)
MONOS PCT: 24 %
NEUTROS ABS: 1.6 10*3/uL — AB (ref 1.7–7.7)
Neutrophils Relative %: 67 %
Platelets: 94 10*3/uL — ABNORMAL LOW (ref 150–400)
RBC: 2.33 MIL/uL — ABNORMAL LOW (ref 3.87–5.11)
RDW: 13.7 % (ref 11.5–15.5)
WBC: 2.4 10*3/uL — ABNORMAL LOW (ref 4.0–10.5)

## 2016-03-02 LAB — COMPREHENSIVE METABOLIC PANEL
ALK PHOS: 93 U/L (ref 38–126)
ALT: 22 U/L (ref 14–54)
ANION GAP: 14 (ref 5–15)
AST: 53 U/L — ABNORMAL HIGH (ref 15–41)
Albumin: 3.2 g/dL — ABNORMAL LOW (ref 3.5–5.0)
BILIRUBIN TOTAL: 0.4 mg/dL (ref 0.3–1.2)
BUN: 41 mg/dL — AB (ref 6–20)
CALCIUM: 8.3 mg/dL — AB (ref 8.9–10.3)
CO2: 25 mmol/L (ref 22–32)
Chloride: 95 mmol/L — ABNORMAL LOW (ref 101–111)
Creatinine, Ser: 2.22 mg/dL — ABNORMAL HIGH (ref 0.44–1.00)
GFR calc Af Amer: 27 mL/min — ABNORMAL LOW (ref >60)
GFR, EST NON AFRICAN AMERICAN: 23 mL/min — AB (ref >60)
Glucose, Bld: 104 mg/dL — ABNORMAL HIGH (ref 65–99)
POTASSIUM: 3.2 mmol/L — AB (ref 3.5–5.1)
Sodium: 134 mmol/L — ABNORMAL LOW (ref 135–145)
TOTAL PROTEIN: 6.4 g/dL — AB (ref 6.5–8.1)

## 2016-03-02 LAB — PREPARE RBC (CROSSMATCH)

## 2016-03-02 LAB — MAGNESIUM: Magnesium: 1.6 mg/dL — ABNORMAL LOW (ref 1.7–2.4)

## 2016-03-02 MED ORDER — HEPARIN SOD (PORK) LOCK FLUSH 100 UNIT/ML IV SOLN
INTRAVENOUS | Status: AC
  Filled 2016-03-02: qty 5

## 2016-03-02 MED ORDER — HEPARIN SOD (PORK) LOCK FLUSH 100 UNIT/ML IV SOLN
500.0000 [IU] | Freq: Once | INTRAVENOUS | Status: AC
  Administered 2016-03-02: 500 [IU] via INTRAVENOUS

## 2016-03-02 MED ORDER — LORAZEPAM 2 MG/ML IJ SOLN
INTRAMUSCULAR | Status: AC
  Filled 2016-03-02: qty 1

## 2016-03-02 MED ORDER — SODIUM CHLORIDE 0.9 % IV SOLN
INTRAVENOUS | Status: DC
  Administered 2016-03-02: 12:00:00 via INTRAVENOUS

## 2016-03-02 MED ORDER — MAGNESIUM SULFATE 2 GM/50ML IV SOLN
2.0000 g | Freq: Once | INTRAVENOUS | Status: AC
  Administered 2016-03-02: 2 g via INTRAVENOUS
  Filled 2016-03-02: qty 50

## 2016-03-02 MED ORDER — POTASSIUM BICARBONATE 25 MEQ PO TBEF: EFFERVESCENT_TABLET | ORAL | Status: DC

## 2016-03-02 MED ORDER — LORAZEPAM 2 MG/ML IJ SOLN
0.5000 mg | INTRAMUSCULAR | Status: AC
  Administered 2016-03-02: 0.5 mg via INTRAVENOUS

## 2016-03-02 MED ORDER — SODIUM CHLORIDE 0.9% FLUSH
10.0000 mL | Freq: Once | INTRAVENOUS | Status: AC
  Administered 2016-03-02: 10 mL via INTRAVENOUS

## 2016-03-02 NOTE — Patient Instructions (Signed)
[place] at [facility] Discharge Instructions  RECOMMENDATIONS MADE BY THE CONSULTANT AND ANY TEST RESULTS WILL BE SENT TO YOUR REFERRING PHYSICIAN.  IV fluids given as ordered. IV magnesium given as ordered. A Adelle Zachar prescription for powdered potassium was sent to your pharmacy. You need to pick this up today and take as directed. Plan for a blood transfusion tomorrow. Come to clinic as soon as you leave radiation.  Thank you for choosing [place] at [facility] to provide your oncology and hematology care.  To afford each patient quality time with our provider, please arrive at least 15 minutes before your scheduled appointment time.   Beginning January 23rd 2017 lab work for the [name] will be done in the  Main lab at [facility] on 1st floor. If you have a lab appointment with the [facility] please come in thru the  Main Entrance and check in at the main information desk  You need to re-schedule your appointment should you arrive 10 or more minutes late.  We strive to give you quality time with our providers, and arriving late affects you and other patients whose appointments are after yours.  Also, if you no show three or more times for appointments you may be dismissed from the clinic at the providers discretion.     Again, thank you for choosing [facility].  Our hope is that these requests will decrease the amount of time that you wait before being seen by our physicians.       _____________________________________________________________  Should you have questions after your visit to [facility], please contact our office at [phone] between the hours of 8:30 a.m. and 4:30 p.m.  Voicemails left after 4:30 p.m. will not be returned until the following business day.  For prescription refill requests, have your pharmacy contact our office.    Resources For Cancer Patients and their  Caregivers ? American Cancer Society: Can assist with transportation, wigs, general needs, runs Look Good Feel Better.        ([phone] [phone] ? Cancer Care: Provides financial assistance, online support groups, medication/co-pay assistance.  1-800-813-HOPE 416-737-8023) ? [facility] Assists [place] cancer patients and their families through emotional , educational and financial support.  323-233-3204 ? [place] DSS Where to apply for food stamps, Medicaid and utility assistance. 959-467-9070 ? [facility]: Transportation to medical appointments. 517-506-9355 ? Social Security Administration: May apply for disability if have a Stage IV cancer. 3646198644 3803069067 ? [employer], Disability and Transit Services: Assists with nutrition, care and transit needs. [phone]

## 2016-03-02 NOTE — Progress Notes (Signed)
Patient complains of weakness in legs and feeling "wobbly". Complains of increased phlegm from radiation and that it causes more nausea. Complaints reported to Dr.Penland, lab work and IV fluids today as ordered.  Tolerated IV fluids and IV magnesium well. Ambulatory on discharge home with friend.

## 2016-03-03 ENCOUNTER — Other Ambulatory Visit: Payer: Self-pay

## 2016-03-03 ENCOUNTER — Inpatient Hospital Stay (HOSPITAL_COMMUNITY)
Admission: EM | Admit: 2016-03-03 | Discharge: 2016-03-10 | DRG: 280 | Disposition: A | Discharge status: Home / Self Care | Payer: Medicaid Other | Attending: Internal Medicine | Admitting: Internal Medicine

## 2016-03-03 ENCOUNTER — Emergency Department (HOSPITAL_COMMUNITY): Payer: Medicaid Other

## 2016-03-03 ENCOUNTER — Encounter (HOSPITAL_BASED_OUTPATIENT_CLINIC_OR_DEPARTMENT_OTHER): Payer: Medicaid Other

## 2016-03-03 ENCOUNTER — Encounter (HOSPITAL_COMMUNITY): Payer: Self-pay

## 2016-03-03 VITALS — BP 126/76 | HR 105 | Temp 98.7°F | Resp 20 | Wt 146.0 lb

## 2016-03-03 DIAGNOSIS — Z923 Personal history of irradiation: Secondary | ICD-10-CM

## 2016-03-03 DIAGNOSIS — E861 Hypovolemia: Secondary | ICD-10-CM | POA: Diagnosis present

## 2016-03-03 DIAGNOSIS — C760 Malignant neoplasm of head, face and neck: Secondary | ICD-10-CM | POA: Diagnosis present

## 2016-03-03 DIAGNOSIS — Z66 Do not resuscitate: Secondary | ICD-10-CM | POA: Diagnosis not present

## 2016-03-03 DIAGNOSIS — I071 Rheumatic tricuspid insufficiency: Secondary | ICD-10-CM | POA: Diagnosis not present

## 2016-03-03 DIAGNOSIS — I10 Essential (primary) hypertension: Secondary | ICD-10-CM | POA: Diagnosis present

## 2016-03-03 DIAGNOSIS — I272 Other secondary pulmonary hypertension: Secondary | ICD-10-CM | POA: Diagnosis present

## 2016-03-03 DIAGNOSIS — I4891 Unspecified atrial fibrillation: Secondary | ICD-10-CM | POA: Diagnosis not present

## 2016-03-03 DIAGNOSIS — J432 Centrilobular emphysema: Secondary | ICD-10-CM | POA: Diagnosis not present

## 2016-03-03 DIAGNOSIS — I5043 Acute on chronic combined systolic (congestive) and diastolic (congestive) heart failure: Secondary | ICD-10-CM | POA: Diagnosis present

## 2016-03-03 DIAGNOSIS — I509 Heart failure, unspecified: Secondary | ICD-10-CM | POA: Insufficient documentation

## 2016-03-03 DIAGNOSIS — I5023 Acute on chronic systolic (congestive) heart failure: Secondary | ICD-10-CM | POA: Diagnosis present

## 2016-03-03 DIAGNOSIS — F17210 Nicotine dependence, cigarettes, uncomplicated: Secondary | ICD-10-CM | POA: Diagnosis present

## 2016-03-03 DIAGNOSIS — M1990 Unspecified osteoarthritis, unspecified site: Secondary | ICD-10-CM | POA: Diagnosis not present

## 2016-03-03 DIAGNOSIS — I130 Hypertensive heart and chronic kidney disease with heart failure and stage 1 through stage 4 chronic kidney disease, or unspecified chronic kidney disease: Secondary | ICD-10-CM | POA: Diagnosis not present

## 2016-03-03 DIAGNOSIS — D6489 Other specified anemias: Secondary | ICD-10-CM | POA: Diagnosis not present

## 2016-03-03 DIAGNOSIS — I252 Old myocardial infarction: Secondary | ICD-10-CM | POA: Diagnosis present

## 2016-03-03 DIAGNOSIS — J449 Chronic obstructive pulmonary disease, unspecified: Secondary | ICD-10-CM | POA: Diagnosis present

## 2016-03-03 DIAGNOSIS — T451X5A Adverse effect of antineoplastic and immunosuppressive drugs, initial encounter: Secondary | ICD-10-CM | POA: Diagnosis not present

## 2016-03-03 DIAGNOSIS — J9601 Acute respiratory failure with hypoxia: Secondary | ICD-10-CM | POA: Diagnosis present

## 2016-03-03 DIAGNOSIS — D649 Anemia, unspecified: Secondary | ICD-10-CM | POA: Diagnosis present

## 2016-03-03 DIAGNOSIS — I480 Paroxysmal atrial fibrillation: Secondary | ICD-10-CM | POA: Diagnosis present

## 2016-03-03 DIAGNOSIS — K567 Ileus, unspecified: Secondary | ICD-10-CM

## 2016-03-03 DIAGNOSIS — Z931 Gastrostomy status: Secondary | ICD-10-CM

## 2016-03-03 DIAGNOSIS — E876 Hypokalemia: Secondary | ICD-10-CM | POA: Diagnosis present

## 2016-03-03 DIAGNOSIS — D61818 Other pancytopenia: Secondary | ICD-10-CM | POA: Diagnosis not present

## 2016-03-03 DIAGNOSIS — N179 Acute kidney failure, unspecified: Secondary | ICD-10-CM

## 2016-03-03 DIAGNOSIS — J9621 Acute and chronic respiratory failure with hypoxia: Secondary | ICD-10-CM | POA: Diagnosis present

## 2016-03-03 DIAGNOSIS — R079 Chest pain, unspecified: Secondary | ICD-10-CM | POA: Diagnosis present

## 2016-03-03 DIAGNOSIS — E785 Hyperlipidemia, unspecified: Secondary | ICD-10-CM | POA: Diagnosis present

## 2016-03-03 DIAGNOSIS — Z9221 Personal history of antineoplastic chemotherapy: Secondary | ICD-10-CM

## 2016-03-03 DIAGNOSIS — I214 Non-ST elevation (NSTEMI) myocardial infarction: Principal | ICD-10-CM

## 2016-03-03 DIAGNOSIS — Z79899 Other long term (current) drug therapy: Secondary | ICD-10-CM | POA: Diagnosis not present

## 2016-03-03 DIAGNOSIS — C801 Malignant (primary) neoplasm, unspecified: Secondary | ICD-10-CM | POA: Diagnosis not present

## 2016-03-03 DIAGNOSIS — I5022 Chronic systolic (congestive) heart failure: Secondary | ICD-10-CM | POA: Diagnosis not present

## 2016-03-03 DIAGNOSIS — J438 Other emphysema: Secondary | ICD-10-CM | POA: Diagnosis not present

## 2016-03-03 DIAGNOSIS — J439 Emphysema, unspecified: Secondary | ICD-10-CM | POA: Diagnosis not present

## 2016-03-03 DIAGNOSIS — N189 Chronic kidney disease, unspecified: Secondary | ICD-10-CM | POA: Diagnosis not present

## 2016-03-03 DIAGNOSIS — R110 Nausea: Secondary | ICD-10-CM | POA: Diagnosis present

## 2016-03-03 HISTORY — DX: Reserved for concepts with insufficient information to code with codable children: IMO0002

## 2016-03-03 HISTORY — DX: Centrilobular emphysema: J43.2

## 2016-03-03 HISTORY — DX: Anodontia: K00.0

## 2016-03-03 HISTORY — DX: Complete loss of teeth, unspecified cause, unspecified class: K08.109

## 2016-03-03 HISTORY — DX: Essential (primary) hypertension: I10

## 2016-03-03 LAB — COMPREHENSIVE METABOLIC PANEL
ALT: 27 U/L (ref 14–54)
AST: 89 U/L — ABNORMAL HIGH (ref 15–41)
Albumin: 3.3 g/dL — ABNORMAL LOW (ref 3.5–5.0)
Alkaline Phosphatase: 96 U/L (ref 38–126)
Anion gap: 12 (ref 5–15)
BILIRUBIN TOTAL: 0.5 mg/dL (ref 0.3–1.2)
BUN: 40 mg/dL — AB (ref 6–20)
CHLORIDE: 94 mmol/L — AB (ref 101–111)
CO2: 27 mmol/L (ref 22–32)
CREATININE: 2.19 mg/dL — AB (ref 0.44–1.00)
Calcium: 8.4 mg/dL — ABNORMAL LOW (ref 8.9–10.3)
GFR, EST AFRICAN AMERICAN: 27 mL/min — AB (ref >60)
GFR, EST NON AFRICAN AMERICAN: 23 mL/min — AB (ref >60)
Glucose, Bld: 128 mg/dL — ABNORMAL HIGH (ref 65–99)
Potassium: 3.1 mmol/L — ABNORMAL LOW (ref 3.5–5.1)
Sodium: 133 mmol/L — ABNORMAL LOW (ref 135–145)
TOTAL PROTEIN: 6.7 g/dL (ref 6.5–8.1)

## 2016-03-03 LAB — CBC WITH DIFFERENTIAL/PLATELET
BASOS ABS: 0 10*3/uL (ref 0.0–0.1)
BASOS PCT: 0 %
EOS ABS: 0 10*3/uL (ref 0.0–0.7)
EOS PCT: 0 %
HCT: 25.4 % — ABNORMAL LOW (ref 36.0–46.0)
HEMOGLOBIN: 8.6 g/dL — AB (ref 12.0–15.0)
LYMPHS ABS: 0.4 10*3/uL — AB (ref 0.7–4.0)
Lymphocytes Relative: 8 %
MCH: 30.1 pg (ref 26.0–34.0)
MCHC: 33.9 g/dL (ref 30.0–36.0)
MCV: 88.8 fL (ref 78.0–100.0)
Monocytes Absolute: 0.6 10*3/uL (ref 0.1–1.0)
Monocytes Relative: 13 %
NEUTROS PCT: 79 %
Neutro Abs: 3.6 10*3/uL (ref 1.7–7.7)
PLATELETS: 100 10*3/uL — AB (ref 150–400)
RBC: 2.86 MIL/uL — AB (ref 3.87–5.11)
RDW: 14.6 % (ref 11.5–15.5)
WBC: 4.6 10*3/uL (ref 4.0–10.5)

## 2016-03-03 LAB — TROPONIN I: TROPONIN I: 14.22 ng/mL — AB (ref <0.031)

## 2016-03-03 LAB — BRAIN NATRIURETIC PEPTIDE: B NATRIURETIC PEPTIDE 5: 2533 pg/mL — AB (ref 0.0–100.0)

## 2016-03-03 MED ORDER — ALBUTEROL SULFATE (2.5 MG/3ML) 0.083% IN NEBU
5.0000 mg | INHALATION_SOLUTION | Freq: Once | RESPIRATORY_TRACT | Status: AC
  Administered 2016-03-03: 5 mg via RESPIRATORY_TRACT
  Filled 2016-03-03: qty 6

## 2016-03-03 MED ORDER — ASPIRIN 300 MG RE SUPP: 300.0000 mg | RECTAL | Status: DC

## 2016-03-03 MED ORDER — FENTANYL 25 MCG/HR TD PT72
25.0000 ug | MEDICATED_PATCH | TRANSDERMAL | Status: DC
  Administered 2016-03-04 – 2016-03-10 (×3): 25 ug via TRANSDERMAL
  Filled 2016-03-03 (×3): qty 1

## 2016-03-03 MED ORDER — CETYLPYRIDINIUM CHLORIDE 0.05 % MT LIQD
7.0000 mL | Freq: Two times a day (BID) | OROMUCOSAL | Status: DC
  Administered 2016-03-04 – 2016-03-10 (×8): 7 mL via OROMUCOSAL

## 2016-03-03 MED ORDER — HEPARIN BOLUS VIA INFUSION
3000.0000 [IU] | Freq: Once | INTRAVENOUS | Status: AC
  Administered 2016-03-03: 3000 [IU] via INTRAVENOUS

## 2016-03-03 MED ORDER — HYDROCHLOROTHIAZIDE 12.5 MG PO CAPS
12.5000 mg | ORAL_CAPSULE | Freq: Every day | ORAL | Status: DC
  Filled 2016-03-03: qty 1

## 2016-03-03 MED ORDER — NAPHAZOLINE-PHENIRAMINE 0.025-0.3 % OP SOLN: 1.0000 [drp] | Freq: Two times a day (BID) | OPHTHALMIC | Status: DC | PRN

## 2016-03-03 MED ORDER — LISINOPRIL-HYDROCHLOROTHIAZIDE 20-12.5 MG PO TABS: 1.0000 | ORAL_TABLET | Freq: Every day | ORAL | Status: DC

## 2016-03-03 MED ORDER — TECHNETIUM TO 99M ALBUMIN AGGREGATED
4.0000 | Freq: Once | INTRAVENOUS | Status: AC | PRN
  Administered 2016-03-03: 4.4 via INTRAVENOUS

## 2016-03-03 MED ORDER — FLUCONAZOLE 100 MG PO TABS
100.0000 mg | ORAL_TABLET | Freq: Every day | ORAL | Status: AC
  Administered 2016-03-04 – 2016-03-08 (×5): 100 mg via ORAL
  Filled 2016-03-03 (×4): qty 1

## 2016-03-03 MED ORDER — SODIUM CHLORIDE 0.9% FLUSH: 3.0000 mL | INTRAVENOUS | Status: DC | PRN

## 2016-03-03 MED ORDER — ASPIRIN 81 MG PO CHEW
324.0000 mg | CHEWABLE_TABLET | Freq: Once | ORAL | Status: AC
  Administered 2016-03-03: 324 mg via ORAL
  Filled 2016-03-03: qty 4

## 2016-03-03 MED ORDER — LISINOPRIL 20 MG PO TABS: 20.0000 mg | ORAL_TABLET | Freq: Every day | ORAL | Status: DC

## 2016-03-03 MED ORDER — ONDANSETRON HCL 4 MG/2ML IJ SOLN
4.0000 mg | Freq: Four times a day (QID) | INTRAMUSCULAR | Status: DC | PRN
  Administered 2016-03-04 – 2016-03-06 (×3): 4 mg via INTRAVENOUS
  Filled 2016-03-03 (×3): qty 2

## 2016-03-03 MED ORDER — LORAZEPAM 0.5 MG PO TABS
0.5000 mg | ORAL_TABLET | ORAL | Status: DC | PRN
  Administered 2016-03-04: 0.5 mg via ORAL
  Filled 2016-03-03: qty 1

## 2016-03-03 MED ORDER — SODIUM CHLORIDE 0.9 % IV SOLN
250.0000 mL | INTRAVENOUS | Status: DC | PRN
  Administered 2016-03-03: 250 mL via INTRAVENOUS

## 2016-03-03 MED ORDER — LORAZEPAM 2 MG/ML IJ SOLN
0.5000 mg | Freq: Once | INTRAMUSCULAR | Status: AC
  Administered 2016-03-03: 0.5 mg via INTRAVENOUS

## 2016-03-03 MED ORDER — NITROGLYCERIN 0.4 MG SL SUBL: 0.4000 mg | SUBLINGUAL_TABLET | SUBLINGUAL | Status: DC | PRN

## 2016-03-03 MED ORDER — SODIUM CHLORIDE 0.9% FLUSH
3.0000 mL | Freq: Two times a day (BID) | INTRAVENOUS | Status: DC
  Administered 2016-03-03: 10 mL via INTRAVENOUS
  Administered 2016-03-04 – 2016-03-05 (×3): 3 mL via INTRAVENOUS

## 2016-03-03 MED ORDER — ACETAMINOPHEN 325 MG PO TABS
650.0000 mg | ORAL_TABLET | ORAL | Status: DC | PRN
  Administered 2016-03-03 – 2016-03-10 (×6): 650 mg via ORAL
  Filled 2016-03-03 (×6): qty 2

## 2016-03-03 MED ORDER — MAGNESIUM OXIDE 400 (241.3 MG) MG PO TABS
400.0000 mg | ORAL_TABLET | Freq: Two times a day (BID) | ORAL | Status: DC
  Administered 2016-03-03 – 2016-03-05 (×4): 400 mg via ORAL
  Filled 2016-03-03 (×4): qty 1

## 2016-03-03 MED ORDER — ASPIRIN EC 81 MG PO TBEC
81.0000 mg | DELAYED_RELEASE_TABLET | Freq: Every day | ORAL | Status: DC
Start: 2016-03-04 — End: 2016-03-07
  Administered 2016-03-04 – 2016-03-06 (×3): 81 mg via ORAL
  Filled 2016-03-03 (×3): qty 1

## 2016-03-03 MED ORDER — METOPROLOL TARTRATE 12.5 MG HALF TABLET
12.5000 mg | ORAL_TABLET | Freq: Two times a day (BID) | ORAL | Status: DC
  Administered 2016-03-03 – 2016-03-06 (×6): 12.5 mg via ORAL
  Filled 2016-03-03 (×6): qty 1

## 2016-03-03 MED ORDER — FUROSEMIDE 10 MG/ML IJ SOLN
40.0000 mg | Freq: Once | INTRAMUSCULAR | Status: AC
  Administered 2016-03-03: 40 mg via INTRAVENOUS
  Filled 2016-03-03: qty 4

## 2016-03-03 MED ORDER — CHLORHEXIDINE GLUCONATE 0.12 % MT SOLN
15.0000 mL | Freq: Two times a day (BID) | OROMUCOSAL | Status: DC
  Administered 2016-03-03 – 2016-03-10 (×12): 15 mL via OROMUCOSAL
  Filled 2016-03-03 (×11): qty 15

## 2016-03-03 MED ORDER — ASPIRIN 81 MG PO CHEW
324.0000 mg | CHEWABLE_TABLET | ORAL | Status: DC
Start: 2016-03-03 — End: 2016-03-03

## 2016-03-03 MED ORDER — NITROGLYCERIN 2 % TD OINT
0.5000 [in_us] | TOPICAL_OINTMENT | Freq: Once | TRANSDERMAL | Status: AC
  Administered 2016-03-03: 0.5 [in_us] via TOPICAL
  Filled 2016-03-03: qty 1

## 2016-03-03 MED ORDER — ZOLPIDEM TARTRATE 5 MG PO TABS
5.0000 mg | ORAL_TABLET | Freq: Every evening | ORAL | Status: DC | PRN
  Administered 2016-03-03: 5 mg via ORAL
  Filled 2016-03-03: qty 1

## 2016-03-03 MED ORDER — PRAVASTATIN SODIUM 40 MG PO TABS
40.0000 mg | ORAL_TABLET | Freq: Every day | ORAL | Status: DC
  Administered 2016-03-04: 40 mg via ORAL
  Filled 2016-03-03: qty 1

## 2016-03-03 MED ORDER — LORAZEPAM 2 MG/ML IJ SOLN
INTRAMUSCULAR | Status: AC
  Filled 2016-03-03: qty 1

## 2016-03-03 MED ORDER — HEPARIN (PORCINE) IN NACL 100-0.45 UNIT/ML-% IJ SOLN
1250.0000 [IU]/h | INTRAMUSCULAR | Status: DC
  Administered 2016-03-03: 900 [IU]/h via INTRAVENOUS
  Administered 2016-03-04: 1050 [IU]/h via INTRAVENOUS
  Administered 2016-03-05: 1250 [IU]/h via INTRAVENOUS
  Filled 2016-03-03 (×3): qty 250

## 2016-03-03 MED ORDER — MORPHINE SULFATE 10 MG/5ML PO SOLN: 5.0000 mg | ORAL | Status: DC | PRN

## 2016-03-03 MED ORDER — DIPHENHYDRAMINE HCL 50 MG/ML IJ SOLN
25.0000 mg | Freq: Once | INTRAMUSCULAR | Status: AC
  Administered 2016-03-03: 25 mg via INTRAVENOUS

## 2016-03-03 MED ORDER — SODIUM CHLORIDE 0.9 % IV SOLN
Freq: Once | INTRAVENOUS | Status: AC
  Administered 2016-03-03: 12:00:00 via INTRAVENOUS

## 2016-03-03 MED ORDER — DIPHENHYDRAMINE HCL 50 MG/ML IJ SOLN
INTRAMUSCULAR | Status: AC
  Filled 2016-03-03: qty 1

## 2016-03-03 MED ORDER — ONDANSETRON HCL 4 MG PO TABS: 4.0000 mg | ORAL_TABLET | Freq: Three times a day (TID) | ORAL | Status: DC | PRN

## 2016-03-03 MED ORDER — TECHNETIUM TC 99M DIETHYLENETRIAME-PENTAACETIC ACID
30.0000 | Freq: Once | INTRAVENOUS | Status: AC | PRN
  Administered 2016-03-03: 30 via RESPIRATORY_TRACT

## 2016-03-03 NOTE — Progress Notes (Signed)
ANTICOAGULATION CONSULT NOTE - Initial Consult  Pharmacy Consult for heparin Indication: chest pain/ACS  Allergies  Allergen Reactions  . Codeine Nausea Only    Patient Measurements:   Heparin Dosing Weight: 66 kg  Vital Signs: Temp: 98.3 F (36.8 C) (04/28 1402) Temp Source: Oral (04/28 1402) BP: 146/91 mmHg (04/28 1402) Pulse Rate: 108 (04/28 1402)  Labs:  Recent Labs  03/02/16 1200 03/03/16 1639  HGB 7.1* 8.6*  HCT 20.9* 25.4*  PLT 94* 100*  CREATININE 2.22* 2.19*  TROPONINI  --  14.22*    Estimated Creatinine Clearance: 25.9 mL/min (by C-G formula based on Cr of 2.19).   Medical History: Past Medical History  Diagnosis Date  . Essential hypertension   . Arthritis   . Hyperlipidemia   . Squamous cell carcinoma (HCC)     Right neck    Medications:  See medication history  Assessment: 60 yo lady to start heparin for CP.  She was not on anticoagulation PTA.  She is receiving chemotherapy. Goal of Therapy:  Heparin level 0.3-0.7 units/ml Monitor platelets by anticoagulation protocol: Yes    Plan:  Heparin bolus 3000 units and drip at 900 units/hr Check heparin level in ~6-8 hours Monitor for bleeding complications  Ayame Rena Poteet 03/03/2016,6:39 PM

## 2016-03-03 NOTE — Progress Notes (Unsigned)
Patient arrived to Cancer center at 12 noon. No acute distress. O2 sat on arrival 88%, after 5-10 min she increased to 93%. C/o nausea. Orders received for lorazepam for nausea and benadryl 25 mg for premed for blood transfusion.

## 2016-03-03 NOTE — H&P (Signed)
CARDIOLOGY ADMISSION NOTE  Patient ID: Gina Frederick MRN: [id number] DOB/AGE: [birth year] 60 y.o.  Admit date: 03/03/2016 Primary Physician   MUSE,ROCHELLE D., PA-C Primary Cardiologist   None Chief Complaint    Dyspnea  HPI:   The patient has no past cardiac history. 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 She says she's undergone about 12 radiation therapies. She's had 2 cycles of cis-platinum. She has had a biopsy but no other surgery on this. Today she was at the [place] [place] to get IV fluid and packed red blood cells. Apparently during therapy she dropped her oxygen saturation on room air is 66%. He was started on 4 L of O2 and sent to the emergency room. There she was found to have an elevated troponin of 14.22. BNP was 2533. He was treated with heparin, Lasix, aspirin and nitroglycerin patch. VQ demonstrate diffuse bilateral patchy ventilation defects and no wedge shaped peripheral perfusion defects to suggest acute pulmonary embolism.    She reports no past cardiac workup or history. She said she hadn't been feeling quite herself last week. She had decreased energy. However, she did become short of breath until today. She didn't have any chest discomfort. Today she did feel like she had some pressure in her abdomen when she was going to throw up but she did not have neck or arm discomfort. She hasn't had any palpitations, presyncope or syncope. She has no PND or orthopnea.  Past Medical History  Diagnosis Date  . Essential hypertension   . Arthritis   . Hyperlipidemia   . Squamous cell carcinoma (HCC)     Invasive - right neck, XRT and Cisplatin - Dr. Whitney Muse  . Edentulous     Multiple dental extractions March 2017  . Centrilobular emphysema ([place])     Past Surgical History  Procedure Laterality Date  . Tonsillectomy    . Knee arthroscopy Right   . Cesarean section    . Mass biopsy Right 12/21/2015    Procedure: NECK MASS BIOPSY;   Surgeon: Leta Baptist, MD;  Location: [facility];  Service: ENT;  Laterality: Right;  . Panendoscopy N/A 12/21/2015    Procedure: PANENDOSCOPY;  Surgeon: Leta Baptist, MD;  Location: [facility];  Service: ENT;  Laterality: N/A;  . Multiple extractions with alveoloplasty N/A 01/13/2016    Procedure: EXTRACTION OF TOOTH #'S 6-12,15, 19-28, 30 WITH ALVEOLOPLASTY AND BILATERAL MAXILLARY TUBEROSITY REDUCTIONS;  Surgeon: Lenn Cal, DDS;  Location: [facility];  Service: Oral Surgery;  Laterality: N/A;    Allergies  Allergen Reactions  . Codeine Nausea Only   Current Facility-Administered Medications on File Prior to Encounter  Medication Dose Route Frequency Provider Last Rate Last Dose  . sodium polystyrene (KAYEXALATE) 15 GM/60ML suspension 30 g  30 g Oral Once [facility], PA-C       Current Outpatient Prescriptions on File Prior to Encounter  Medication Sig Dispense Refill  . acetaminophen (TYLENOL) 500 MG tablet Take 1,000 mg by mouth every 6 (six) hours as needed for mild pain or moderate pain.     Marland Kitchen CISPLATIN IV Inject into the vein. Reported on 01/21/2016    . diphenhydrAMINE (BENADRYL) 25 MG tablet Take 25 mg by mouth every 6 (six) hours as needed.    . fentaNYL (DURAGESIC) 25 MCG/HR patch Place 1 patch (25 mcg total) onto the skin every 3 (three) days. 10 patch 0  . lidocaine-prilocaine (EMLA) cream Apply a  quarter size amount to port site 1 hour prior to chemo. Do not rub in. Cover with plastic wrap. 30 g 3  . lisinopril-hydrochlorothiazide (PRINZIDE,ZESTORETIC) 20-12.5 MG tablet Take 1 tablet by mouth daily. Reported on 02/10/2016    . magnesium oxide (MAG-OX) 400 (241.3 Mg) MG tablet Take 1 tablet (400 mg total) by mouth 2 (two) times daily. 60 tablet 3  . ondansetron (ZOFRAN) 8 MG tablet TAKE 1 TABLET EVERY 8 HOURS AS NEEDED FOR NAUSEA AND VOMITING 30 tablet 6  . oxyCODONE-acetaminophen (PERCOCET) 10-325 MG tablet Take every 2-4 hours as needed for pain (Patient  taking differently: Take 1 tablet by mouth every 2 (two) hours as needed for pain. ) 60 tablet 0  . prochlorperazine (COMPAZINE) 10 MG tablet Take 1 tablet (10 mg total) by mouth every 6 (six) hours as needed for nausea or vomiting. 30 tablet 2  . Tetrahydrozoline-Zn Sulfate (EYE DROPS ALLERGY RELIEF OP) Apply 1 drop to eye 2 (two) times daily as needed (Itching eyes).    Marland Kitchen zolpidem (AMBIEN) 10 MG tablet Take 1 tablet (10 mg total) by mouth at bedtime as needed for sleep. 30 tablet 0  . LORazepam (ATIVAN) 0.5 MG tablet Take 1 tablet every 4-6 hours as needed for nausea/vomiting. 45 tablet 0  . lovastatin (MEVACOR) 20 MG tablet Take 20 mg by mouth at bedtime. Reported on 02/17/2016    . morphine 10 MG/5ML solution Take 2.5 mLs (5 mg total) by mouth every 2 (two) hours as needed for severe pain. (Patient not taking: Reported on 03/03/2016) 100 mL 0   Social History   Social History  . Marital Status: Divorced    Spouse Name: N/A  . Number of Children: 1  . Years of Education: N/A   Occupational History  . Not on file.   Social History Main Topics  . Smoking status: Current Every Day Smoker -- 0.25 packs/day    Types: Cigarettes  . Smokeless tobacco: Never Used     Comment: cut back to 1 cigarette per day  . Alcohol Use: No  . Drug Use: No  . Sexual Activity: No   Other Topics Concern  . Not on file   Social History Narrative    Family History  Problem Relation Age of Onset  . Alzheimer's disease Mother   . Congestive Heart Failure Father      ROS:  As stated in the HPI and negative for all other systems.   Physical Exam: Blood pressure 133/91, pulse 114, temperature 98.3 F (36.8 C), temperature source Oral, resp. rate 17, SpO2 93 %.  GENERAL:  Well appearing despite chronic illness.   HEENT:  Pupils equal round and reactive, fundi not visualized, oral mucosa unremarkable, edentulous NECK:  No jugular venous distention, waveform within normal limits, carotid upstroke brisk  and symmetric, no bruits, no thyromegaly, tumor right anterior neck LYMPHATICS:  No cervical, inguinal adenopathy LUNGS:  Bilateral mild basilar crackles.   BACK:  No CVA tenderness CHEST:  Unremarkable, porta cath HEART:  PMI not displaced or sustained,S1 and S2 within normal limits, no S3, no S4, no clicks, no rubs, no murmurs ABD:  Flat, positive bowel sounds normal in frequency in pitch, no bruits, no rebound, no guarding, no midline pulsatile mass, no hepatomegaly, no splenomegaly, feeding tube EXT:  2 plus pulses throughout, no edema, no cyanosis no clubbing SKIN:  No rashes no nodules NEURO:  Cranial nerves II through XII grossly intact, motor grossly intact throughout PSYCH:  Cognitively intact,  oriented to person place and time  Labs: Lab Results  Component Value Date   BUN 40* 03/03/2016   Lab Results  Component Value Date   CREATININE 2.19* 03/03/2016   Lab Results  Component Value Date   NA 133* 03/03/2016   K 3.1* 03/03/2016   CL 94* 03/03/2016   CO2 27 03/03/2016   Lab Results  Component Value Date   TROPONINI 14.22* 03/03/2016   Lab Results  Component Value Date   WBC 4.6 03/03/2016   HGB 8.6* 03/03/2016   HCT 25.4* 03/03/2016   MCV 88.8 03/03/2016   PLT 100* 03/03/2016   No results found for: CHOL, HDL, LDLCALC, LDLDIRECT, TRIG, CHOLHDL Lab Results  Component Value Date   ALT 27 03/03/2016   AST 89* 03/03/2016   ALKPHOS 96 03/03/2016   BILITOT 0.5 03/03/2016      Radiology:  CXR:  IMPRESSION: 1. Hyperexpanded lungs indicating COPD/emphysema. 2. Prominent interstitial markings bilaterally, new compared to the recent PET-CT, suggesting acute interstitial edema related to CHF/volume overload. 3. Mild cardiomegaly.  EKG:  NSR, rate 115, nonspecific ST T wave changes, QT prolonged, poor anterior R wave progression.   ASSESSMENT AND PLAN:    DYSPNEA:   Acute pulmonary edema.  Gentle diuresis given her increased creatinine.  Check an  echocardiogram in the AM.    NSTEMI:   No acute EKG changes, no ongoing pain.  In addition she has CKD as below.  She is not therefore going to have an acute cath as the risk of worsening renal function outweigh the benefits.  I will continue IV heparin, ASA and check an echocardiogram.   CKD:  This seems to be acute on chronic CKD.  Creat one day ago was 2.22 but 11 days ago was 1.29  ANEMIA:  Hbg is up from previous status the transfusion.   Will follow.    HYPOKALEMIA:  Supplement and follow.      SignedMinus Breeding 03/03/2016, 9:30 PM

## 2016-03-03 NOTE — Progress Notes (Unsigned)
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  She received her 2nd cycle of Cisplatin on 4/17.  In the clinic, she her 02 saturation on RA dropped to 66%.  She was started on 4 L of O2 with recovery in the 90's.  Given this new change, patient will be sent to ED for further evaluation and management.  She is otherwise afebrile, normotensive, and pulse is at baseline in the low 100's.  Robynn Pane, PA-C 03/03/2016 1:52 PM

## 2016-03-03 NOTE — ED Provider Notes (Signed)
CSN: [id number]     Arrival date & time 03/03/16  1353 History   First MD Initiated Contact with Patient 03/03/16 1408     No chief complaint on file.    (Consider location/radiation/quality/duration/timing/severity/associated sxs/prior Treatment) HPI Patient presents from the [facility], where she was receiving blood transfusion. I discussed patient's case with the physician assistant at the cancer center prior to transfer.  Patient herself states that over the past day she has felt more dyspneic, fatigue a usual. Patient is receiving both chemotherapy and radiation therapy for right neck cancer. She denies any changes around the cancer site, right lateral neck, including no erythema, drainage, bleeding. However, she states that she has been progressively worse in general She denies history of pulmonary disease.  precipitant, but the patient notes that over the past day or so she has felt progressively weaker, with more dyspnea. no fever, vomiting. physician assistant at cancer center states that the patient was hypoxic there, saturation 60%.   Past Medical History  Diagnosis Date  . Hypertension   . Arthritis     hands  . Neck mass     right neck(surfaced 11'16,then progressively enlarged"- denies swallwing or breathing difficulty.  . Hyperlipidemia   . Cancer ([place])     dx. squamous cell carcinoma-right neck   Past Surgical History  Procedure Laterality Date  . Tonsillectomy    . Knee arthroscopy      right  . Cesarean section    . Mass biopsy Right 12/21/2015    Procedure: NECK MASS BIOPSY;  Surgeon: Leta Baptist, MD;  Location: [facility];  Service: ENT;  Laterality: Right;  . Panendoscopy N/A 12/21/2015    Procedure: PANENDOSCOPY;  Surgeon: Leta Baptist, MD;  Location: [facility];  Service: ENT;  Laterality: N/A;  . Multiple extractions with alveoloplasty N/A 01/13/2016    Procedure: EXTRACTION OF TOOTH #'S 6-12,15, 19-28, 30 WITH ALVEOLOPLASTY AND  BILATERAL MAXILLARY TUBEROSITY REDUCTIONS;  Surgeon: Lenn Cal, DDS;  Location: [facility];  Service: Oral Surgery;  Laterality: N/A;   Family History  Problem Relation Age of Onset  . Alzheimer's disease Mother   . Congestive Heart Failure Father    Social History  Substance Use Topics  . Smoking status: Current Every Day Smoker -- 0.25 packs/day    Types: Cigarettes  . Smokeless tobacco: Never Used     Comment: cut back to 1 cigarette per day  . Alcohol Use: No   OB History    No data available     Review of Systems  Constitutional:       Per HPI, otherwise negative  HENT:       Per HPI, otherwise negative  Respiratory:       Per HPI, otherwise negative  Cardiovascular:       Per HPI, otherwise negative  Gastrointestinal: Positive for nausea. Negative for vomiting.  Endocrine:       Negative aside from HPI  Genitourinary:       Neg aside from HPI   Musculoskeletal:       Per HPI, otherwise negative  Skin: Positive for color change.  Allergic/Immunologic: Positive for immunocompromised state.  Neurological: Positive for weakness. Negative for syncope.      Allergies  Codeine  Home Medications   Prior to Admission medications   Medication Sig Start Date End Date Taking? Authorizing Provider  acetaminophen (TYLENOL) 500 MG tablet Take 500 mg by mouth every 6 (six) hours as needed.  Historical Provider, MD  CISPLATIN IV Inject into the vein. Reported on 01/21/2016    Historical Provider, MD  diphenhydrAMINE (BENADRYL) 25 MG tablet Take 25 mg by mouth every 6 (six) hours as needed.    Historical Provider, MD  fentaNYL (DURAGESIC) 25 MCG/HR patch Place 1 patch (25 mcg total) onto the skin every 3 (three) days. 02/28/16   Patrici Ranks, MD  lidocaine-prilocaine (EMLA) cream Apply a quarter size amount to port site 1 hour prior to chemo. Do not rub in. Cover with plastic wrap. 01/20/16   Patrici Ranks, MD  lisinopril-hydrochlorothiazide (PRINZIDE,ZESTORETIC)  20-12.5 MG tablet Take 1 tablet by mouth daily. Reported on 02/10/2016    Historical Provider, MD  LORazepam (ATIVAN) 0.5 MG tablet Take 1 tablet every 4-6 hours as needed for nausea/vomiting. 02/28/16   Patrici Ranks, MD  lovastatin (MEVACOR) 20 MG tablet Take 20 mg by mouth at bedtime. Reported on 02/17/2016    Historical Provider, MD  magnesium oxide (MAG-OX) 400 (241.3 Mg) MG tablet Take 1 tablet (400 mg total) by mouth 2 (two) times daily. 02/10/16   Patrici Ranks, MD  morphine 10 MG/5ML solution Take 2.5 mLs (5 mg total) by mouth every 2 (two) hours as needed for severe pain. 02/28/16   Patrici Ranks, MD  naproxen sodium (ANAPROX) 220 MG tablet Take 220 mg by mouth 2 (two) times daily as needed (Pain). Reported on 02/17/2016    Historical Provider, MD  ondansetron (ZOFRAN) 8 MG tablet TAKE 1 TABLET EVERY 8 HOURS AS NEEDED FOR NAUSEA AND VOMITING 02/15/16   Patrici Ranks, MD  oxyCODONE-acetaminophen (PERCOCET) 10-325 MG tablet Take every 2-4 hours as needed for pain 01/31/16   Patrici Ranks, MD  potassium bicarbonate (K-LYTE) 25 MEQ disintegrating tablet Dissolve in water and administer via PEG twice daily 03/02/16   Patrici Ranks, MD  potassium chloride (KLOR-CON) 20 MEQ packet Take 40 mEq by mouth 2 (two) times daily. Patient not taking: Reported on 02/28/2016 02/10/16   Patrici Ranks, MD  prochlorperazine (COMPAZINE) 10 MG tablet Take 1 tablet (10 mg total) by mouth every 6 (six) hours as needed for nausea or vomiting. 02/28/16   Patrici Ranks, MD  Tetrahydrozoline-Zn Sulfate (EYE DROPS ALLERGY RELIEF OP) Apply 1 drop to eye 2 (two) times daily as needed (Itching eyes).    Historical Provider, MD  zolpidem (AMBIEN) 10 MG tablet Take 1 tablet (10 mg total) by mouth at bedtime as needed for sleep. 02/28/16 03/29/16  Patrici Ranks, MD   BP 146/91 mmHg  Pulse 108  Temp(Src) 98.3 F (36.8 C) (Oral)  Resp 24  SpO2 98% Physical Exam  Constitutional: She is oriented to  person, place, and time. She has a sickly appearance.  HENT:  Head: Normocephalic and atraumatic.  Eyes: Conjunctivae and EOM are normal.  Neck:    Cardiovascular: Regular rhythm.  Tachycardia present.   Pulmonary/Chest: Accessory muscle usage present. No stridor. Tachypnea noted. She has decreased breath sounds. She has wheezes.  Abdominal: She exhibits no distension.  Musculoskeletal: She exhibits no edema.  Neurological: She is alert and oriented to person, place, and time. No cranial nerve deficit.  Skin: Skin is warm and dry.  Patient actively receiving blood transfusion via peripheral catheter  Psychiatric: She has a normal mood and affect.  Nursing note and vitals reviewed.   ED Course  Procedures (including critical care time) Labs Review Labs Reviewed  COMPREHENSIVE METABOLIC PANEL - Abnormal; Notable for  the following:    Sodium 133 (*)    Potassium 3.1 (*)    Chloride 94 (*)    Glucose, Bld 128 (*)    BUN 40 (*)    Creatinine, Ser 2.19 (*)    Calcium 8.4 (*)    Albumin 3.3 (*)    AST 89 (*)    GFR calc non Af Amer 23 (*)    GFR calc Af Amer 27 (*)    All other components within normal limits  CBC WITH DIFFERENTIAL/PLATELET - Abnormal; Notable for the following:    RBC 2.86 (*)    Hemoglobin 8.6 (*)    HCT 25.4 (*)    Platelets 100 (*)    Lymphs Abs 0.4 (*)    All other components within normal limits  TROPONIN I - Abnormal; Notable for the following:    Troponin I 14.22 (*)    All other components within normal limits  BRAIN NATRIURETIC PEPTIDE - Abnormal; Notable for the following:    B Natriuretic Peptide 2533.0 (*)    All other components within normal limits    Imaging Review Dg Chest 2 View  03/03/2016  CLINICAL DATA:  Hypoxia, active cancer, low oxygen level. EXAM: CHEST  2 VIEW COMPARISON:  Head CT dated 01/19/2016. FINDINGS: Lungs are hyperexpanded indicating COPD/emphysema. Prominent interstitial markings noted throughout both lungs. No  confluent airspace opacity to suggest a developing pneumonia. No pneumothorax seen. There is mild cardiomegaly. Left chest wall Port-A-Cath in place with tip well-positioned at the level of the mid SVC. No acute or suspicious osseous lesions seen. Mild degenerative spurring noted within the thoracic spine. IMPRESSION: 1. Hyperexpanded lungs indicating COPD/emphysema. 2. Prominent interstitial markings bilaterally, new compared to the recent PET-CT, suggesting acute interstitial edema related to CHF/volume overload. 3. Mild cardiomegaly. Electronically Signed   By: Franki Cabot M.D.   On: 03/03/2016 15:21   Nm Pulmonary Perf And Vent  03/03/2016  CLINICAL DATA:  Shortness of breath for several days, current history of malignancy EXAM: NUCLEAR MEDICINE VENTILATION - PERFUSION LUNG SCAN TECHNIQUE: Ventilation images were obtained in multiple projections using inhaled aerosol Tc-59m DTPA. Perfusion images were obtained in multiple projections after intravenous injection of Tc-26m MAA. RADIOPHARMACEUTICALS:  30 mCi Technetium-45m DTPA aerosol inhalation and 4.4 mCi Technetium-1m MAA IV COMPARISON:  Chest radiograph 03/03/16 FINDINGS: Ventilation:  Diffuse bilateral patchy ventilation defects Perfusion: No wedge shaped peripheral perfusion defects to suggest acute pulmonary embolism. IMPRESSION: Patchy ventilation abnormality bilaterally without perfusion defect. Study does not suggest the presence of pulmonary embolism. Electronically Signed   By: Skipper Cliche M.D.   On: 03/03/2016 16:37   I have personally reviewed and evaluated these images and lab results as part of my medical decision-making.   EKG Interpretation   Date/Time:  Friday March 03 2016 15:17:36 EDT Ventricular Rate:  115 PR Interval:  172 QRS Duration: 94 QT Interval:  344 QTC Calculation: 476 R Axis:   68 Text Interpretation:  Sinus tachycardia Borderline repolarization  abnormality Sinus tachycardia Artifact Abnormal ekg Confirmed  by Carmin Muskrat  MD ([id number]) on 03/03/2016 3:24:41 PM     Pulse oximetry 96% with nasal cannula abnormal  6:06 PM On repeat exam the patient remains in similar condition, tachycardic, tachypneic. Initial labs reviewed with the patient. Labs notable for elevated troponin, BNP.  She again denies history of coronary disease, congestive heart failure.  I discussed patient's case with our cardiology colleagues per Patient has already received Lasix, now will receive nitroglycerin,  paste, heparin, aspirin, and will be transferred to our affiliated care center.    MDM  Patient presents with concern of dyspnea, hypoxia Patient is a notable history of cancer, and with her new hypoxia, tachypnea, tachycardia, there was some initial suspicion for pulmonary embolism or cardiogenic cause.  Interestingly, the patient does not have other e/o fluid overload status. Patient has diffuse bilateral wheezing, concerning for fluid overload status. Evaluation demonstrates elevated troponin, BNP, concerning for NSTEMI, acute heart failure.  Patient received initial diuretic. Patient required transfer to our cardiac ICU given her new heart failure, concern for NSTEMI.  CRITICAL CARE Performed by: Carmin Muskrat Total critical care time: 45 minutes Critical care time was exclusive of separately billable procedures and treating other patients. Critical care was necessary to treat or prevent imminent or life-threatening deterioration. Critical care was time spent personally by me on the following activities: development of treatment plan with patient and/or surrogate as well as nursing, discussions with consultants, evaluation of patient's response to treatment, examination of patient, obtaining history from patient or surrogate, ordering and performing treatments and interventions, ordering and review of laboratory studies, ordering and review of radiographic studies, pulse oximetry and re-evaluation of  patient's condition.    Carmin Muskrat, MD 03/03/16 [phone]

## 2016-03-03 NOTE — ED Notes (Signed)
CRITICAL VALUE ALERT  Critical value received: Troponin 14.22 Date of notification:  03/03/16  Time of notification:  [id number]  Critical value read back:Yes.    Nurse who received alert:  JRM  MD notified (1st page):  Vanita Panda  Time of first page:  [phone]  MD notified (2nd page):  Time of second page:  Responding MD:  Vanita Panda  Time MD responded:  [phone]

## 2016-03-03 NOTE — Progress Notes (Unsigned)
Patient transferred to ER via wheelchair by [facility] RN and Shellia Carwin RN. Pt assisted to bed in ER Room 9. Sol Passer took over patient's care.

## 2016-03-03 NOTE — ED Notes (Signed)
Pt here from cancer center for evaluation of low oxygen level. Per nurse pt's 02 sats were in the 60's.  Pt placed on oxygen Pt arrived with a unit of blood infusing and is to get a second per nurse from cancer center

## 2016-03-04 ENCOUNTER — Inpatient Hospital Stay (HOSPITAL_COMMUNITY): Payer: Medicaid Other

## 2016-03-04 DIAGNOSIS — C801 Malignant (primary) neoplasm, unspecified: Secondary | ICD-10-CM

## 2016-03-04 DIAGNOSIS — D6489 Other specified anemias: Secondary | ICD-10-CM

## 2016-03-04 DIAGNOSIS — N179 Acute kidney failure, unspecified: Secondary | ICD-10-CM

## 2016-03-04 LAB — TROPONIN I
TROPONIN I: 15.86 ng/mL — AB (ref <0.031)
TROPONIN I: 21.49 ng/mL — AB (ref <0.031)
TROPONIN I: 23.06 ng/mL — AB (ref <0.031)

## 2016-03-04 LAB — CBC
HEMATOCRIT: 24 % — AB (ref 36.0–46.0)
HEMOGLOBIN: 7.9 g/dL — AB (ref 12.0–15.0)
MCH: 29.2 pg (ref 26.0–34.0)
MCHC: 32.9 g/dL (ref 30.0–36.0)
MCV: 88.6 fL (ref 78.0–100.0)
Platelets: 97 10*3/uL — ABNORMAL LOW (ref 150–400)
RBC: 2.71 MIL/uL — AB (ref 3.87–5.11)
RDW: 15.2 % (ref 11.5–15.5)
WBC: 5.5 10*3/uL (ref 4.0–10.5)

## 2016-03-04 LAB — BASIC METABOLIC PANEL
ANION GAP: 12 (ref 5–15)
BUN: 36 mg/dL — AB (ref 6–20)
CALCIUM: 8.4 mg/dL — AB (ref 8.9–10.3)
CO2: 27 mmol/L (ref 22–32)
CREATININE: 2.22 mg/dL — AB (ref 0.44–1.00)
Chloride: 98 mmol/L — ABNORMAL LOW (ref 101–111)
GFR calc Af Amer: 27 mL/min — ABNORMAL LOW (ref >60)
GFR, EST NON AFRICAN AMERICAN: 23 mL/min — AB (ref >60)
GLUCOSE: 113 mg/dL — AB (ref 65–99)
Potassium: 3.5 mmol/L (ref 3.5–5.1)
Sodium: 137 mmol/L (ref 135–145)

## 2016-03-04 LAB — TYPE AND SCREEN
ABO/RH(D): A POS
Antibody Screen: NEGATIVE
UNIT DIVISION: 0
Unit division: 0

## 2016-03-04 LAB — HEPARIN LEVEL (UNFRACTIONATED)
Heparin Unfractionated: 0.19 IU/mL — ABNORMAL LOW (ref 0.30–0.70)
Heparin Unfractionated: 0.23 IU/mL — ABNORMAL LOW (ref 0.30–0.70)
Heparin Unfractionated: 0.4 IU/mL (ref 0.30–0.70)

## 2016-03-04 LAB — GLUCOSE, CAPILLARY
GLUCOSE-CAPILLARY: 135 mg/dL — AB (ref 65–99)
Glucose-Capillary: 155 mg/dL — ABNORMAL HIGH (ref 65–99)

## 2016-03-04 LAB — MRSA PCR SCREENING: MRSA by PCR: NEGATIVE

## 2016-03-04 LAB — ECHOCARDIOGRAM COMPLETE
Height: 64 in
Weight: 2317.48 oz

## 2016-03-04 MED ORDER — FREE WATER
175.0000 mL | Freq: Four times a day (QID) | Status: DC
  Administered 2016-03-04 – 2016-03-05 (×3): 175 mL

## 2016-03-04 MED ORDER — WHITE PETROLATUM GEL
Status: DC | PRN
  Filled 2016-03-04: qty 1

## 2016-03-04 MED ORDER — OSMOLITE 1.5 CAL PO LIQD
237.0000 mL | Freq: Four times a day (QID) | ORAL | Status: DC
  Administered 2016-03-04 – 2016-03-05 (×3): 237 mL
  Filled 2016-03-04 (×10): qty 237

## 2016-03-04 MED ORDER — JEVITY 1.2 CAL PO LIQD: 1000.0000 mL | ORAL | Status: DC

## 2016-03-04 MED ORDER — MORPHINE SULFATE (CONCENTRATE) 10 MG/0.5ML PO SOLN
5.0000 mg | ORAL | Status: DC | PRN
  Administered 2016-03-04 – 2016-03-06 (×3): 5 mg via ORAL
  Filled 2016-03-04 (×3): qty 0.5

## 2016-03-04 NOTE — Progress Notes (Signed)
ANTICOAGULATION CONSULT NOTE - Follow Up Consult  Pharmacy Consult for Heparin  Indication: chest pain/ACS  Allergies  Allergen Reactions  . Codeine Nausea Only    Patient Measurements: Height: 5\' 4"  (162.6 cm) Weight: 144 lb 13.5 oz (65.7 kg) IBW/kg (Calculated) : 54.7  Vital Signs: Temp: 97.9 F (36.6 C) (04/29 1600) Temp Source: Oral (04/29 1600) BP: 139/95 mmHg (04/29 1800) Pulse Rate: 105 (04/29 1800)  Labs:  Recent Labs  03/02/16 1200  03/03/16 1639 03/04/16 0020 03/04/16 0311 03/04/16 0543 03/04/16 1049 03/04/16 1050 03/04/16 1745  HGB 7.1*  --  8.6*  --   --  7.9*  --   --   --   HCT 20.9*  --  25.4*  --   --  24.0*  --   --   --   PLT 94*  --  100*  --   --  97*  --   --   --   HEPARINUNFRC  --   --   --   --  0.19*  --  0.23*  --  0.40  CREATININE 2.22*  --  2.19*  --   --  2.22*  --   --   --   TROPONINI  --   < > 14.22* 21.49*  --  23.06*  --  15.86*  --   < > = values in this interval not displayed.  Estimated Creatinine Clearance: 25.5 mL/min (by C-G formula based on Cr of 2.22).  Assessment: Heparin for NSTEMI, troponin rising, initial HL have been low, no issues per RN. CBC stable.  Repeat HL is therapeutic at 0.4 on heparin 1250 units/hr. No issues with infusion or bleeding noted.  Goal of Therapy:  Heparin level 0.3-0.7 units/ml Monitor platelets by anticoagulation protocol: Yes   Plan:  Continue heparin 1250 units/hr 6 hour HL Daily HL/CBC  Andrey Cota. Diona Foley, PharmD, [place] Clinical Pharmacist Pager [phone]  03/04/2016 7:06 PM

## 2016-03-04 NOTE — Progress Notes (Signed)
ANTICOAGULATION CONSULT NOTE - Follow Up Consult  Pharmacy Consult for Heparin  Indication: chest pain/ACS  Allergies  Allergen Reactions  . Codeine Nausea Only    Patient Measurements: Height: 5\' 4"  (162.6 cm) Weight: 144 lb 13.5 oz (65.7 kg) IBW/kg (Calculated) : 54.7  Vital Signs: Temp: 98.6 F (37 C) (04/28 2337) Temp Source: Oral (04/28 2337) BP: 139/98 mmHg (04/29 0300) Pulse Rate: 94 (04/29 0300)  Labs:  Recent Labs  03/02/16 1200 03/03/16 1639 03/04/16 0020 03/04/16 0311  HGB 7.1* 8.6*  --   --   HCT 20.9* 25.4*  --   --   PLT 94* 100*  --   --   HEPARINUNFRC  --   --   --  0.19*  CREATININE 2.22* 2.19*  --   --   TROPONINI  --  14.22* 21.49*  --     Estimated Creatinine Clearance: 25.8 mL/min (by C-G formula based on Cr of 2.19).  Assessment: Heparin for NSTEMI, troponin rising, initial HL is low at 0.19, no issues per RN.   Goal of Therapy:  Heparin level 0.3-0.7 units/ml Monitor platelets by anticoagulation protocol: Yes   Plan:  -Increase heparin to 1050 units/hr -1200 HL  Gina Frederick 03/04/2016,4:19 AM

## 2016-03-04 NOTE — Progress Notes (Addendum)
Initial Nutrition Assessment  DOCUMENTATION CODES:   Not applicable  INTERVENTION:   -Continue with Heart Healthy diet  -Initiate bolus feedings of 237 ml (1 can) of Osmolite 1.5 via PEG 4 times daily  Tube feeding regimen provides 1420 kcal (81% of needs), 60 grams of protein, and 724 ml of H2O.   -175 ml free water flush 4 times daily  NUTRITION DIAGNOSIS:   Inadequate oral intake related to cancer and cancer related treatments as evidenced by meal completion < 25% (PEG dependent).  GOAL:   Patient will meet greater than or equal to 90% of their needs  MONITOR:   PO intake, Labs, Weight trends, TF tolerance, Skin, I & O's  REASON FOR ASSESSMENT:   Consult Enteral/tube feeding initiation and management  ASSESSMENT:   The patient has no past cardiac history. 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 She says she's undergone about 12 radiation therapies. She's had 2 cycles of cis-platinum. She has had a biopsy but no other surgery on this. Today she was at the [facility] [place] to get IV fluid and packed red blood cells. Apparently during therapy she dropped her oxygen saturation on room air is 66%. He was started on 4 L of O2 and sent to the emergency room. There she was found to have an elevated troponin of 14.22. BNP was 2533. He was treated with heparin, Lasix, aspirin and nitroglycerin patch. VQ demonstrate diffuse bilateral patchy ventilation defects and no wedge shaped peripheral perfusion defects to suggest acute pulmonary embolism.   Pt admitted with acute pulmonary edema.   Pt with hx of squamous cell neck cancer; she is current receiving radiation treatments through [facility].   Hx obtained from pt at bedside. She reports feeling ok, but overall has a positive attitude, laughing and joking with this RD. She reports that her appetite is generally poor and usually "nibbles here and there" several times a day. She confirms that the  majority of her nutrition comes from her PEG.   Pt is followed by [facility] RD; reviewed RD note, which reveal that pt's prescribed home TF regimen is 5 cans Osmolite 1.5 5 times per day with 150 ml flush before and after each administration (complete regimen provides 1775 kcals, 75 grams protein, and 2405 ml fluid daily. Pt confirms that this is the prescribed regimen, however, she reports she usually only administers 4 cans per day, which is consistent with [facility] RD notes. Regimen that pt actually administers at home provides 1420 kcal (81% of needs), 60 grams of protein (80% of needs), and 724 ml of H2O  Pt reports her weight has stabilized since starting TF and now typically weighs around 144#.   Nutrition-Focused physical exam completed. Findings are no fat depletion, severe muscle depletion, and mild edema.   Pt has no further nutrition concerns at this time. She feels confident administering her TF at home ("it's really easy") and has no difficulty obtaining needed formula and supplies. Encouraged pt to continue to follow-up with [facility] RD.   Labs reviewed.   Diet Order:  Diet Heart Room service appropriate?: Yes; Fluid consistency:: Thin  Skin:  Reviewed, no issues  Last BM:  03/03/16  Height:   Ht Readings from Last 1 Encounters:  03/03/16 5\' 4"  (1.626 m)    Weight:   Wt Readings from Last 1 Encounters:  03/03/16 144 lb 13.5 oz (65.7 kg)    Ideal Body Weight:  54.5 kg  BMI:  Body  mass index is 24.85 kg/(m^2).  Estimated Nutritional Needs:   Kcal:  [id number]  Protein:  75-90 grams  Fluid:  >1.7 L  EDUCATION NEEDS:   Education needs addressed  Estoria Geary A. Jimmye Norman, RD, LDN, CDE Pager: [phone] After hours Pager: [phone]

## 2016-03-04 NOTE — Progress Notes (Addendum)
ANTICOAGULATION CONSULT NOTE - Follow Up Consult  Pharmacy Consult for Heparin  Indication: chest pain/ACS  Allergies  Allergen Reactions  . Codeine Nausea Only    Patient Measurements: Height: 5\' 4"  (162.6 cm) Weight: 144 lb 13.5 oz (65.7 kg) IBW/kg (Calculated) : 54.7  Vital Signs: Temp: 98.3 F (36.8 C) (04/29 1200) Temp Source: Oral (04/29 1200) BP: 128/90 mmHg (04/29 1200) Pulse Rate: 95 (04/29 1200)  Labs:  Recent Labs  03/02/16 1200  03/03/16 1639 03/04/16 0020 03/04/16 0311 03/04/16 0543 03/04/16 1049 03/04/16 1050  HGB 7.1*  --  8.6*  --   --  7.9*  --   --   HCT 20.9*  --  25.4*  --   --  24.0*  --   --   PLT 94*  --  100*  --   --  97*  --   --   HEPARINUNFRC  --   --   --   --  0.19*  --  0.23*  --   CREATININE 2.22*  --  2.19*  --   --  2.22*  --   --   TROPONINI  --   < > 14.22* 21.49*  --  23.06*  --  15.86*  < > = values in this interval not displayed.  Estimated Creatinine Clearance: 25.5 mL/min (by C-G formula based on Cr of 2.22).  Assessment: Heparin for NSTEMI, troponin rising, initial HL have been low, no issues per RN. CBC stable.  Goal of Therapy:  Heparin level 0.3-0.7 units/ml Monitor platelets by anticoagulation protocol: Yes   Plan:  - Increase heparin to 1250 units/hr - 6 hour HL - Daily HL/CBC  Melburn Popper, PharmD Clinical Pharmacy Resident Pager: [phone] 03/04/2016 1:51 PM

## 2016-03-04 NOTE — Progress Notes (Signed)
Cardiologist: Dr. 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 VQ scan showed no evidence of acute pulmonary embolism.  This morning she is feeling much better, less short of breath, resting comfortably. Overall decreased energy, lethargy over the past week has felt poor. No active chest pain. She is asking for something to help with her discomfort on her neck. She has mild oozing from this site.  Objective:  Vital Signs in the last 24 hours: Temp:  [98.3 F (36.8 C)-98.8 F (37.1 C)] 98.3 F (36.8 C) (04/29 0800) Pulse Rate:  [73-114] 73 (04/29 0600) Resp:  [12-37] 23 (04/29 0800) BP: (119-164)/(76-127) 152/99 mmHg (04/29 0800) SpO2:  [76 %-99 %] 94 % (04/29 0800) Weight:  [144 lb 13.5 oz (65.7 kg)-146 lb (66.225 kg)] 144 lb 13.5 oz (65.7 kg) (04/28 2100)  Intake/Output from previous day: 04/28 0701 - 04/29 0700 In: 202.8 [I.V.:202.8] Out: 250 [Urine:250]   Physical Exam: General: Moderately Ill-appearing from chronic illness, comfortable on her side in bed, tired, well developed,  in no acute distress. Head:  Normocephalic and atraumatic. Neck radiation scarring noted. Tumor right anterior neck, Port-A-Cath Lungs: Clear to auscultation and percussion. Heart: Normal S1 and S2.  No murmur, rubs or gallops.  Abdomen: soft, non-tender, positive bowel sounds. Extremities: No clubbing or cyanosis. No edema. Neurologic: Alert and oriented x 3.    Lab Results:  Recent Labs  03/03/16 1639 03/04/16 0543  WBC 4.6 5.5  HGB 8.6* 7.9*  PLT 100* 97*    Recent Labs  03/03/16 1639 03/04/16 0543  NA 133* 137  K 3.1* 3.5  CL 94* 98*  CO2 27 27  GLUCOSE 128* 113*  BUN 40* 36*    CREATININE 2.19* 2.22*    Recent Labs  03/04/16 0020 03/04/16 0543  TROPONINI 21.49* 23.06*   Hepatic Function Panel  Recent Labs  03/03/16 1639  PROT 6.7  ALBUMIN 3.3*  AST 89*  ALT 27  ALKPHOS 96  BILITOT 0.5   No results for input(s): CHOL in the last 72 hours. No results for input(s): PROTIME in the last 72 hours.  Imaging: Dg Chest 2 View  03/03/2016  CLINICAL DATA:  Hypoxia, active cancer, low oxygen level. EXAM: CHEST  2 VIEW COMPARISON:  Head CT dated 01/19/2016. FINDINGS: Lungs are hyperexpanded indicating COPD/emphysema. Prominent interstitial markings noted throughout both lungs. No confluent airspace opacity to suggest a developing pneumonia. No pneumothorax seen. There is mild cardiomegaly. Left chest wall Port-A-Cath in place with tip well-positioned at the level of the mid SVC. No acute or suspicious osseous lesions seen. Mild degenerative spurring noted within the thoracic spine. IMPRESSION: 1. Hyperexpanded lungs indicating COPD/emphysema. 2. Prominent interstitial markings bilaterally, new compared to the recent PET-CT, suggesting acute interstitial edema related to CHF/volume overload. 3. Mild cardiomegaly. Electronically Signed   By: Franki Cabot M.D.   On: 03/03/2016 15:21   Nm Pulmonary Perf And Vent  03/03/2016  CLINICAL DATA:  Shortness of breath for several days, current history of malignancy EXAM: NUCLEAR MEDICINE VENTILATION - PERFUSION LUNG SCAN TECHNIQUE: Ventilation images were obtained in multiple projections using inhaled aerosol Tc-3m DTPA. Perfusion images were obtained in multiple projections after intravenous injection of Tc-46m MAA. RADIOPHARMACEUTICALS:  30 mCi Technetium-95m DTPA aerosol inhalation and 4.4 mCi Technetium-26m MAA IV COMPARISON:  Chest radiograph 03/03/16 FINDINGS: Ventilation:  Diffuse bilateral patchy ventilation defects Perfusion: No wedge shaped peripheral perfusion defects to suggest acute pulmonary embolism. IMPRESSION:  Patchy ventilation abnormality bilaterally without perfusion defect. Study does not suggest the presence of pulmonary embolism. Electronically Signed   By: Skipper Cliche M.D.   On: 03/03/2016 16:37   Personally viewed.   Telemetry: No adverse arrhythmias Personally viewed.   EKG:   03/04/16-sinus tachycardia 109, mild T-wave inversion lateral leads, nonspecific no significant change from prior Personally viewed.  Cardiac Studies:  Echocardiogram pending  Meds: Scheduled Meds: . antiseptic oral rinse  7 mL Mouth Rinse q12n4p  . aspirin EC  81 mg Oral Daily  . chlorhexidine  15 mL Mouth Rinse BID  . fentaNYL  25 mcg Transdermal Q72H  . fluconazole  100 mg Oral Daily  . magnesium oxide  400 mg Oral BID  . metoprolol tartrate  12.5 mg Oral BID  . pravastatin  40 mg Oral q1800  . sodium chloride flush  3 mL Intravenous Q12H   Continuous Infusions: . heparin 1,050 Units/hr (03/04/16 0600)   PRN Meds:.sodium chloride, acetaminophen, LORazepam, morphine CONCENTRATE, naphazoline-pheniramine, nitroGLYCERIN, ondansetron (ZOFRAN) IV, sodium chloride flush, white petrolatum, zolpidem  Assessment/Plan:  Active Problems:   NSTEMI (non-ST elevated myocardial infarction) (HCC)  Non-ST elevation myocardial infarction -Reviewed notes from Dr. Percival Spanish and agree, would not pursue acute cardiac catheterization especially given acute renal failure. Troponin increased from 14-23. Increases overall mortality risk -Continue IV heparin for the next 24 hours. -Aspirin, low-dose beta blocker. -Continue pravastatin 40 mg -Await echocardiogram  Dyspnea -Resolved. No further IV Lasix. -Await results of echocardiogram.  Acute renal failure -Creatinine 2 weeks ago was 1.29, currently 2.2 -Holding lisinopril as well as hydrochlorothiazide.  Anemia -Hemoglobin decreased today to 7.9. -Hemoglobin was 7.1 on 4/27, increased 8.6 on 4/28 in the setting of her acute dyspnea/flash pulmonary edema likely also  exacerbated by volume load from packed red blood cells. -May need further transfusion, continue to monitor.  Advanced squamous cell carcinoma of the neck -Radiation scars noted. Asking for Vaseline.  Given the magnitude of her squamous cell carcinoma, worsening anemia, acute kidney failure, I will ask hospitalist service to assume care allowing [place] to continue in consultation.    Stefanie Hodgens, [facility] 03/04/2016, 10:42 AM

## 2016-03-04 NOTE — Progress Notes (Signed)
CRITICAL VALUE ALERT  Critical value received:  Troponin 21.49  Date of notification:  03/04/16  Time of notification:  01:25  Critical value read back: yes  Nurse who received alert:  Arma Heading  MD notified (1st page):  Hochrein  Time of first page:  01:28  Responding MD:  Hochrein  Time MD responded:  01:29

## 2016-03-05 ENCOUNTER — Inpatient Hospital Stay (HOSPITAL_COMMUNITY): Payer: Medicaid Other

## 2016-03-05 DIAGNOSIS — J9601 Acute respiratory failure with hypoxia: Secondary | ICD-10-CM

## 2016-03-05 DIAGNOSIS — J438 Other emphysema: Secondary | ICD-10-CM

## 2016-03-05 LAB — HEPARIN LEVEL (UNFRACTIONATED): Heparin Unfractionated: 0.3 IU/mL (ref 0.30–0.70)

## 2016-03-05 LAB — PREPARE RBC (CROSSMATCH)

## 2016-03-05 LAB — CBC
HEMATOCRIT: 22.3 % — AB (ref 36.0–46.0)
HEMOGLOBIN: 7.5 g/dL — AB (ref 12.0–15.0)
MCH: 30.5 pg (ref 26.0–34.0)
MCHC: 33.6 g/dL (ref 30.0–36.0)
MCV: 90.7 fL (ref 78.0–100.0)
Platelets: 97 10*3/uL — ABNORMAL LOW (ref 150–400)
RBC: 2.46 MIL/uL — ABNORMAL LOW (ref 3.87–5.11)
RDW: 15 % (ref 11.5–15.5)
WBC: 5.2 10*3/uL (ref 4.0–10.5)

## 2016-03-05 LAB — GLUCOSE, CAPILLARY
GLUCOSE-CAPILLARY: 172 mg/dL — AB (ref 65–99)
GLUCOSE-CAPILLARY: 111 mg/dL — AB (ref 65–99)
GLUCOSE-CAPILLARY: 59 mg/dL — AB (ref 65–99)
GLUCOSE-CAPILLARY: 105 mg/dL — AB (ref 65–99)
Glucose-Capillary: 124 mg/dL — ABNORMAL HIGH (ref 65–99)
Glucose-Capillary: 124 mg/dL — ABNORMAL HIGH (ref 65–99)
Glucose-Capillary: 91 mg/dL (ref 65–99)

## 2016-03-05 LAB — ABO/RH: ABO/RH(D): A POS

## 2016-03-05 LAB — CREATININE, URINE, RANDOM: Creatinine, Urine: 120.01 mg/dL

## 2016-03-05 LAB — SODIUM, URINE, RANDOM: Sodium, Ur: <10 mmol/L

## 2016-03-05 MED ORDER — HEPARIN SODIUM (PORCINE) 5000 UNIT/ML IJ SOLN
5000.0000 [IU] | Freq: Three times a day (TID) | INTRAMUSCULAR | Status: DC
  Administered 2016-03-05 – 2016-03-07 (×6): 5000 [IU] via SUBCUTANEOUS
  Filled 2016-03-05 (×6): qty 1

## 2016-03-05 MED ORDER — SODIUM CHLORIDE 0.9 % IV SOLN
Freq: Once | INTRAVENOUS | Status: AC
  Administered 2016-03-05: 17:00:00 via INTRAVENOUS

## 2016-03-05 MED ORDER — SALINE SPRAY 0.65 % NA SOLN
1.0000 | NASAL | Status: DC | PRN
  Filled 2016-03-05: qty 44

## 2016-03-05 MED ORDER — ATORVASTATIN CALCIUM 40 MG PO TABS
40.0000 mg | ORAL_TABLET | Freq: Every day | ORAL | Status: DC
  Administered 2016-03-05 – 2016-03-09 (×5): 40 mg via ORAL
  Filled 2016-03-05 (×7): qty 1

## 2016-03-05 MED ORDER — IPRATROPIUM-ALBUTEROL 0.5-2.5 (3) MG/3ML IN SOLN
3.0000 mL | Freq: Four times a day (QID) | RESPIRATORY_TRACT | Status: DC
  Administered 2016-03-05 – 2016-03-09 (×15): 3 mL via RESPIRATORY_TRACT
  Filled 2016-03-05 (×15): qty 3

## 2016-03-05 MED ORDER — FUROSEMIDE 10 MG/ML IJ SOLN
60.0000 mg | Freq: Once | INTRAMUSCULAR | Status: AC
  Administered 2016-03-05: 60 mg via INTRAVENOUS
  Filled 2016-03-05 (×2): qty 6

## 2016-03-05 MED ORDER — BUDESONIDE 0.25 MG/2ML IN SUSP
0.2500 mg | Freq: Two times a day (BID) | RESPIRATORY_TRACT | Status: DC
  Administered 2016-03-05 – 2016-03-10 (×10): 0.25 mg via RESPIRATORY_TRACT
  Filled 2016-03-05 (×10): qty 2

## 2016-03-05 NOTE — Progress Notes (Addendum)
Hypoglycemic Event  CBG: 59  Treatment: 15 GM carbohydrate snack  Symptoms: None  Follow-up CBG: Time: 1300 CBG Result: 105  Possible Reasons for Event: Inadequate meal intake  Comments/MD notified: MD notified.     [name], Gina Frederick

## 2016-03-05 NOTE — Progress Notes (Signed)
Cardiologist: Dr. 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 VQ scan showed no evidence of acute pulmonary embolism.  This morning she is feeling much better, less short of breath, resting comfortably. Overall decreased energy, lethargy over the past week has felt poor. No active chest pain. She is asking for something to help with her discomfort on her neck. She has mild oozing from this site.  Objective:  Vital Signs in the last 24 hours: Temp:  [97.9 F (36.6 C)-99.2 F (37.3 C)] 98.2 F (36.8 C) (04/30 0800) Pulse Rate:  [93-114] 104 (04/30 0800) Resp:  [13-30] 19 (04/30 0800) BP: (105-145)/(67-101) 140/84 mmHg (04/30 0800) SpO2:  [90 %-99 %] 98 % (04/30 0800) Weight:  [144 lb 12.8 oz (65.681 kg)] 144 lb 12.8 oz (65.681 kg) (04/30 0338)  Intake/Output from previous day: 04/29 0701 - 04/30 0700 In: 1585.3 [P.O.:440; I.V.:321.3; NG/GT:824] Out: 100 [Urine:100]   Physical Exam: General: Moderately Ill-appearing from chronic illness, comfortable sitting on edge of bed, tired, well developed,  in no acute distress. Head:  Normocephalic and atraumatic. Neck radiation scarring noted. Tumor right anterior, dressed neck, Port-A-Cath Lungs: Mild wheeze B. Heart: Normal S1 and S2.  No murmur, rubs or gallops.  Abdomen: soft, non-tender, positive bowel sounds. Extremities: No clubbing or cyanosis. No edema. Neurologic: Alert and oriented x 3.    Lab Results:  Recent Labs  03/04/16 0543 03/05/16 0500  WBC 5.5 5.2  HGB 7.9* 7.5*  PLT 97* 97*    Recent Labs  03/03/16 1639 03/04/16 0543  NA 133* 137  K 3.1* 3.5  CL 94* 98*  CO2 27 27  GLUCOSE 128* 113*  BUN 40* 36*    CREATININE 2.19* 2.22*    Recent Labs  03/04/16 0543 03/04/16 1050  TROPONINI 23.06* 15.86*   Hepatic Function Panel  Recent Labs  03/03/16 1639  PROT 6.7  ALBUMIN 3.3*  AST 89*  ALT 27  ALKPHOS 96  BILITOT 0.5   No results for input(s): CHOL in the last 72 hours. No results for input(s): PROTIME in the last 72 hours.  Imaging: Dg Chest 2 View  03/03/2016  CLINICAL DATA:  Hypoxia, active cancer, low oxygen level. EXAM: CHEST  2 VIEW COMPARISON:  Head CT dated 01/19/2016. FINDINGS: Lungs are hyperexpanded indicating COPD/emphysema. Prominent interstitial markings noted throughout both lungs. No confluent airspace opacity to suggest a developing pneumonia. No pneumothorax seen. There is mild cardiomegaly. Left chest wall Port-A-Cath in place with tip well-positioned at the level of the mid SVC. No acute or suspicious osseous lesions seen. Mild degenerative spurring noted within the thoracic spine. IMPRESSION: 1. Hyperexpanded lungs indicating COPD/emphysema. 2. Prominent interstitial markings bilaterally, new compared to the recent PET-CT, suggesting acute interstitial edema related to CHF/volume overload. 3. Mild cardiomegaly. Electronically Signed   By: Franki Cabot M.D.   On: 03/03/2016 15:21   Nm Pulmonary Perf And Vent  03/03/2016  CLINICAL DATA:  Shortness of breath for several days, current history of malignancy EXAM: NUCLEAR MEDICINE VENTILATION - PERFUSION LUNG SCAN TECHNIQUE: Ventilation images were obtained in multiple projections using inhaled aerosol Tc-71m DTPA. Perfusion images were obtained in multiple projections after intravenous injection of Tc-20m MAA. RADIOPHARMACEUTICALS:  30 mCi  Technetium-102m DTPA aerosol inhalation and 4.4 mCi Technetium-11m MAA IV COMPARISON:  Chest radiograph 03/03/16 FINDINGS: Ventilation:  Diffuse bilateral patchy ventilation defects Perfusion: No wedge shaped peripheral perfusion defects to suggest acute pulmonary embolism. IMPRESSION:  Patchy ventilation abnormality bilaterally without perfusion defect. Study does not suggest the presence of pulmonary embolism. Electronically Signed   By: Skipper Cliche M.D.   On: 03/03/2016 16:37   Personally viewed.   Telemetry: No adverse arrhythmias Personally viewed.   EKG:   03/04/16-sinus tachycardia 109, mild T-wave inversion lateral leads, nonspecific no significant change from prior Personally viewed.  Cardiac Studies:  Echocardiogram 03/04/16: Left ventricle: The cavity size was normal. Systolic function was  mildly to moderately reduced. The estimated ejection fraction was  in the range of 40% to 45%. Severe hypokinesis of the inferior  myocardium. - Left atrium: The atrium was mildly to moderately dilated. - Right atrium: The atrium was mildly dilated. - Tricuspid valve: There was moderate regurgitation. - Pulmonary arteries: Systolic pressure was moderately increased.  PA peak pressure: 48 mm Hg (S).  Meds: Scheduled Meds: . antiseptic oral rinse  7 mL Mouth Rinse q12n4p  . aspirin EC  81 mg Oral Daily  . chlorhexidine  15 mL Mouth Rinse BID  . feeding supplement (OSMOLITE 1.5 CAL)  237 mL Per Tube QID  . fentaNYL  25 mcg Transdermal Q72H  . fluconazole  100 mg Oral Daily  . free water  175 mL Per Tube QID  . magnesium oxide  400 mg Oral BID  . metoprolol tartrate  12.5 mg Oral BID  . pravastatin  40 mg Oral q1800  . sodium chloride flush  3 mL Intravenous Q12H   Continuous Infusions: . heparin 1,250 Units/hr (03/05/16 0700)   PRN Meds:.sodium chloride, acetaminophen, LORazepam, morphine CONCENTRATE, naphazoline-pheniramine, nitroGLYCERIN, ondansetron (ZOFRAN) IV, sodium chloride flush, white petrolatum, zolpidem  Assessment/Plan:  Active Problems:   NSTEMI (non-ST elevated myocardial infarction) (HCC)  Non-ST elevation myocardial infarction -Reviewed notes from Dr. Percival Spanish and agree, would not pursue acute cardiac catheterization especially given acute  renal failure. Feels better, no Chest pain. Troponin increased from 14-23 now trending down.  -ECHO with EF 40-45% inferior wall hypokinesis -Stop IV heparin, completed course. -Aspirin, low-dose beta blocker. -No ACE-I with ARF -Change pravastatin 40 mg to atorvastatin 40mg .   Dyspnea -Resolved. No further IV Lasix. Improved. Underlying COPD as well. Mild wheeze on exam.  -EF [id number], acute systolic HF/ NSTEMI.  -Bb, no ACE-I (renal fnx). No lasix at this time.  Acute renal failure -Creatinine 2 weeks ago was 1.29, currently 2.2 -Holding lisinopril as well as hydrochlorothiazide.  Anemia -Hemoglobin decreased today to 7.5 from 7.9. -Hemoglobin was 7.1 on 4/27, increased 8.6 on 4/28 in the setting of her acute dyspnea/flash pulmonary edema likely also exacerbated by volume load from packed red blood cells. -May need further transfusion, continue to monitor.  Advanced squamous cell carcinoma of the neck -Radiation scars noted. Asking for Vaseline.  Appreciate hospitalist service.    SKAINS, [place] 03/05/2016, 10:14 AM

## 2016-03-05 NOTE — Progress Notes (Addendum)
ANTICOAGULATION CONSULT NOTE - Follow Up Consult  Pharmacy Consult for Heparin  Indication: chest pain/ACS  Allergies  Allergen Reactions  . Codeine Nausea Only    Patient Measurements: Height: 5\' 4"  (162.6 cm) Weight: 144 lb 12.8 oz (65.681 kg) IBW/kg (Calculated) : 54.7  Vital Signs: Temp: 98.4 F (36.9 C) (04/30 1146) Temp Source: Oral (04/30 1146) BP: 140/98 mmHg (04/30 1200) Pulse Rate: 101 (04/30 1146)  Labs:  Recent Labs  03/03/16 1639 03/04/16 0020  03/04/16 0543 03/04/16 1049 03/04/16 1050 03/04/16 1745 03/05/16 0500  HGB 8.6*  --   --  7.9*  --   --   --  7.5*  HCT 25.4*  --   --  24.0*  --   --   --  22.3*  PLT 100*  --   --  97*  --   --   --  97*  HEPARINUNFRC  --   --   < >  --  0.23*  --  0.40 0.30  CREATININE 2.19*  --   --  2.22*  --   --   --   --   TROPONINI 14.22* 21.49*  --  23.06*  --  15.86*  --   --   < > = values in this interval not displayed.  Estimated Creatinine Clearance: 25.5 mL/min (by C-G formula based on Cr of 2.22).  Assessment: Heparin for NSTEMI, initial HL were low. CBC stable. HL is now therapeutic at 0.3 on heparin 1250 units/hr. No issues with infusion or bleeding noted.  Goal of Therapy:  Heparin level 0.3-0.7 units/ml Monitor platelets by anticoagulation protocol: Yes   Plan:  Continue heparin 1250 units/hr Daily HL/CBC Monitor for signs/symptoms of bleeding   Melburn Popper, PharmD Clinical Pharmacy Resident Pager: [phone] 03/05/2016 1:31 PM  ----------------------------------------------------- Heparin is now to be discontinued per Dr. Marlou Porch note

## 2016-03-05 NOTE — Progress Notes (Signed)
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 She was at the [facility] in [place] to get IV fluid and packed red blood cells when she dropped her oxygen saturation on room air to 66% and was sent to the emergency room. In the ED she was found to have a troponin of 14.22. BNP was 2533. VQ demonstrated diffuse bilateral patchy ventilation defects but no perfusion defects to suggest acute pulmonary embolism.  She was transferred to [facility] and admitted to the [facility] Cardiology service.    HPI/Subjective: Resting comfortably in bed.  Still feels somewhat sob, but denies cp, n/v, or abdom pain.  Has not yet been up and around to a signif extent.  Reports poor oral intake.    Assessment/Plan:  NSTEMI Cardiology following - on ASA, BB, pravastatin - has completed IV heparin course   Recent Labs Lab 03/03/16 1639 03/04/16 0020 03/04/16 0543 03/04/16 1050  TROPONINI 14.22* 21.49* 23.06* [id number]*   Mild systolic CHF EF [id number] via TTE w/ severe hypokinesis of inferior myocardium - +1.6L since admit - baseline wgt prior to this admit appears to be ~65kg - gently diurese today and watch renal fxn  Filed Weights   03/03/16 2100 03/05/16 0338  Weight: 65.7 kg (144 lb 13.5 oz) 65.681 kg (144 lb 12.8 oz)    Acute hypoxic respiratory failure  Pulmonary edema + baseline COPD - still requiring 4L [place] - titrate toward RA as able - diurese gently and watch renal fxn   Acute renal failure  Baseline crt 1.29 - was on ACE + HCTZ - ?MI related transient hypoperfusion - check renal US  Recent Labs Lab 03/02/16 1200 03/03/16 1639 03/04/16 0543  CREATININE 2.22* 2.19* 2.22*    Anemia  w/ CAD (assumed given NSTEMI) will transfuse to goal of  8.0 or > - etiology felt to be CA  Advanced squamous cell carcinoma R neck - s/p XRT + cisplatin Currently receiving XRT in [place]  HTN BP poorly controlled at present - follow w/ diuresis   COPD No active wheezing - pt still smoking - initiate scheduled bronchodilators   Nutrition Experienced signif reflux of tube feeds last evening, w/ tube feed also leaking around the PEG insertion site - holding tube feeds for now - check KUB in AM to r/o ileus - may require tube study to confirm position   Code Status: FULL Family Communication: no family present at time of exam Disposition Plan: SDU   Consultants: [facility] Cardiology   Procedures: 4/29 TTE - as above  Antibiotics: none  DVT prophylaxis: SQ heparin   Objective: Blood pressure 140/98, pulse 101, temperature 98.4 F (36.9 C), temperature source Oral, resp. rate 17, height 5\' 4"  (1.626 m), weight 65.681 kg (144 lb 12.8 oz), SpO2 95 %.  Intake/Output Summary (Last 24 hours) at 03/05/16 1316 Last data filed at 03/05/16 1300  Gross per 24 hour  Intake 1706.08 ml  Output    250 ml  Net 1456.08 ml   Exam: General: No acute respiratory distress at rest  Lungs: mild bibasilar crackles - poor air movement th/o - no wheeze  Cardiovascular: Regular rate and rhythm without murmur gallop or rub normal S1 and S2 Abdomen: Nontender, nondistended, soft, bowel sounds positive, no rebound, no ascites,  no appreciable mass Extremities: no significant cyanosis, or clubbing; trace edema bilateral lower extremities  Data Reviewed:  Basic Metabolic Panel:  Recent Labs Lab 03/02/16 1200 03/03/16 1639 03/04/16 0543  NA 134* 133* 137  K 3.2* 3.1* 3.5  CL 95* 94* 98*  CO2 25 27 27   GLUCOSE 104* 128* 113*  BUN 41* 40* 36*  CREATININE 2.22* 2.19* 2.22*  CALCIUM 8.3* 8.4* 8.4*  MG 1.6*  --   --     CBC:  Recent Labs Lab 03/02/16 1200 03/03/16 1639 03/04/16 0543 03/05/16 0500  WBC 2.4* 4.6 5.5 5.2  NEUTROABS 1.6* 3.6  --    --   HGB 7.1* 8.6* 7.9* 7.5*  HCT 20.9* 25.4* 24.0* 22.3*  MCV 89.7 88.8 88.6 90.7  PLT 94* 100* 97* 97*    Liver Function Tests:  Recent Labs Lab 03/02/16 1200 03/03/16 1639  AST 53* 89*  ALT 22 27  ALKPHOS 93 96  BILITOT 0.4 0.5  PROT 6.4* 6.7  ALBUMIN 3.2* 3.3*    Cardiac Enzymes:  Recent Labs Lab 03/03/16 1639 03/04/16 0020 03/04/16 0543 03/04/16 1050  TROPONINI 14.22* 21.49* 23.06* 15.86*    CBG:  Recent Labs Lab 03/05/16 0337 03/05/16 0855 03/05/16 1151 03/05/16 1301 03/05/16 1310  GLUCAP 124* 111* 59* 105* 124*    Recent Results (from the past 240 hour(s))  MRSA PCR Screening     Status: None   Collection Time: 03/03/16  9:49 PM  Result Value Ref Range Status   MRSA by PCR NEGATIVE NEGATIVE Final    Comment:        The GeneXpert MRSA Assay (FDA approved for NASAL specimens only), is one component of a comprehensive MRSA colonization surveillance program. It is not intended to diagnose MRSA infection nor to guide or monitor treatment for MRSA infections.      Studies:   Recent x-ray studies have been reviewed in detail by the Attending Physician  Scheduled Meds:  Scheduled Meds: . antiseptic oral rinse  7 mL Mouth Rinse q12n4p  . aspirin EC  81 mg Oral Daily  . atorvastatin  40 mg Oral q1800  . chlorhexidine  15 mL Mouth Rinse BID  . feeding supplement (OSMOLITE 1.5 CAL)  237 mL Per Tube QID  . fentaNYL  25 mcg Transdermal Q72H  . fluconazole  100 mg Oral Daily  . free water  175 mL Per Tube QID  . magnesium oxide  400 mg Oral BID  . metoprolol tartrate  12.5 mg Oral BID  . sodium chloride flush  3 mL Intravenous Q12H    Time spent on care of this patient: 35 mins   Jayelle Page T , MD   [facility] Office  [phone] Pager - Text Page per Shea Evans as per below:  On-Call/Text Page:      [email or web address]      password [email or web address]  If 7PM-7AM, please contact night-coverage [email or web address] Password [email or web address] 03/05/2016, 1:16  PM   LOS: 2 days

## 2016-03-06 ENCOUNTER — Ambulatory Visit (HOSPITAL_COMMUNITY): Payer: Self-pay

## 2016-03-06 ENCOUNTER — Inpatient Hospital Stay (HOSPITAL_COMMUNITY): Payer: Self-pay

## 2016-03-06 ENCOUNTER — Ambulatory Visit (HOSPITAL_COMMUNITY): Payer: Self-pay | Admitting: Hematology & Oncology

## 2016-03-06 LAB — TYPE AND SCREEN
ABO/RH(D): A POS
ANTIBODY SCREEN: NEGATIVE
UNIT DIVISION: 0

## 2016-03-06 LAB — COMPREHENSIVE METABOLIC PANEL
ALBUMIN: 2.4 g/dL — AB (ref 3.5–5.0)
ALK PHOS: 78 U/L (ref 38–126)
ALT: 16 U/L (ref 14–54)
ANION GAP: 11 (ref 5–15)
AST: 22 U/L (ref 15–41)
BUN: 39 mg/dL — ABNORMAL HIGH (ref 6–20)
CHLORIDE: 96 mmol/L — AB (ref 101–111)
CO2: 28 mmol/L (ref 22–32)
CREATININE: 2.1 mg/dL — AB (ref 0.44–1.00)
Calcium: 8.6 mg/dL — ABNORMAL LOW (ref 8.9–10.3)
GFR calc Af Amer: 29 mL/min — ABNORMAL LOW (ref >60)
GFR calc non Af Amer: 25 mL/min — ABNORMAL LOW (ref >60)
GLUCOSE: 116 mg/dL — AB (ref 65–99)
Potassium: 3.7 mmol/L (ref 3.5–5.1)
SODIUM: 135 mmol/L (ref 135–145)
Total Bilirubin: 0.7 mg/dL (ref 0.3–1.2)
Total Protein: 5.7 g/dL — ABNORMAL LOW (ref 6.5–8.1)

## 2016-03-06 LAB — CORTISOL: Cortisol, Plasma: 18.3 ug/dL

## 2016-03-06 LAB — MAGNESIUM: Magnesium: 2.2 mg/dL (ref 1.7–2.4)

## 2016-03-06 LAB — CBC
HCT: 24.4 % — ABNORMAL LOW (ref 36.0–46.0)
HEMOGLOBIN: 8.4 g/dL — AB (ref 12.0–15.0)
MCH: 30.5 pg (ref 26.0–34.0)
MCHC: 34.4 g/dL (ref 30.0–36.0)
MCV: 88.7 fL (ref 78.0–100.0)
PLATELETS: 90 10*3/uL — AB (ref 150–400)
RBC: 2.75 MIL/uL — AB (ref 3.87–5.11)
RDW: 16.1 % — ABNORMAL HIGH (ref 11.5–15.5)
WBC: 5.2 10*3/uL (ref 4.0–10.5)

## 2016-03-06 MED ORDER — SODIUM CHLORIDE 0.9% FLUSH
10.0000 mL | INTRAVENOUS | Status: DC | PRN
  Administered 2016-03-08: 10 mL
  Filled 2016-03-06: qty 40

## 2016-03-06 MED ORDER — DEXTROSE 5 % IV SOLN: 5.0000 mg/h | INTRAVENOUS | Status: DC

## 2016-03-06 MED ORDER — SODIUM CHLORIDE 0.9% FLUSH
10.0000 mL | Freq: Two times a day (BID) | INTRAVENOUS | Status: DC
  Administered 2016-03-06 – 2016-03-10 (×6): 10 mL

## 2016-03-06 MED ORDER — METOPROLOL TARTRATE 50 MG PO TABS
50.0000 mg | ORAL_TABLET | Freq: Two times a day (BID) | ORAL | Status: DC
  Administered 2016-03-06: 50 mg via ORAL
  Filled 2016-03-06: qty 1

## 2016-03-06 MED ORDER — FUROSEMIDE 10 MG/ML IJ SOLN
60.0000 mg | Freq: Once | INTRAMUSCULAR | Status: AC
  Administered 2016-03-06: 60 mg via INTRAVENOUS
  Filled 2016-03-06: qty 6

## 2016-03-06 MED ORDER — METOPROLOL TARTRATE 5 MG/5ML IV SOLN
5.0000 mg | Freq: Once | INTRAVENOUS | Status: AC
  Administered 2016-03-06: 5 mg via INTRAVENOUS
  Filled 2016-03-06: qty 5

## 2016-03-06 MED ORDER — OSMOLITE 1.5 CAL PO LIQD
237.0000 mL | Freq: Three times a day (TID) | ORAL | Status: DC
  Administered 2016-03-06 – 2016-03-10 (×4): 237 mL
  Filled 2016-03-06 (×22): qty 237

## 2016-03-06 MED ORDER — DILTIAZEM LOAD VIA INFUSION
15.0000 mg | Freq: Once | INTRAVENOUS | Status: DC
Start: 2016-03-06 — End: 2016-03-06
  Filled 2016-03-06: qty 15

## 2016-03-06 NOTE — Progress Notes (Signed)
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 She was at the [facility] in [place] to get IV fluid and packed red blood cells when she dropped her oxygen saturation on room air to 66% and was sent to the emergency room. In the ED she was found to have a troponin of 14.22. BNP was 2533. VQ demonstrated diffuse bilateral patchy ventilation defects but no perfusion defects to suggest acute pulmonary embolism.  She was transferred to [facility] and admitted to the [facility] Cardiology service.    HPI/Subjective: The pt is resting comfortably.  She denies abdom pain, sob, n/v, or cp.  She feels weak in general.    Assessment/Plan:  NSTEMI Cardiology following - on ASA, BB, pravastatin - has completed IV heparin course - plan for medical tx only  Recent Labs Lab 03/03/16 1639 03/04/16 0020 03/04/16 0543 03/04/16 1050  TROPONINI 14.22* 21.49* 23.06* [id number]*   Mild systolic CHF EF [id number] via TTE w/ severe hypokinesis of inferior myocardium - +1.7L since admit - baseline wgt prior to this admit appears to be ~65kg - no evidence of volume overload on exam   Filed Weights   03/03/16 2100 03/05/16 0338 03/06/16 0338  Weight: 65.7 kg (144 lb 13.5 oz) 65.681 kg (144 lb 12.8 oz) 64.9 kg (143 lb 1.3 oz)    Acute hypoxic respiratory failure  Pulmonary edema + baseline COPD - still requiring 4L [place] - titrate toward RA as able - diurese gently and watch renal fxn - f/u CXR in AM  Acute renal failure  Baseline crt 1.29 - was on ACE + HCTZ - ?MI related transient hypoperfusion - slowly improving - consider renal US if worsens again - follow w/ gentle diuresis   Recent Labs Lab 03/02/16 1200 03/03/16 1639 03/04/16 0543 03/06/16 0355    CREATININE 2.22* 2.19* 2.22* 2.10*    Anemia  w/ CAD (assumed given NSTEMI) - transfuse to goal of 8.0 or > - etiology felt to be CA - now s/p 1U PRBC  Recent Labs Lab 03/02/16 1200 03/03/16 1639 03/04/16 0543 03/05/16 0500 03/06/16 0355  HGB 7.1* 8.6* 7.9* 7.5* 8.4*    Advanced squamous cell carcinoma R neck - s/p XRT + cisplatin Currently receiving XRT in [place]  HTN BP not yet at goal - follow w/ ongoing diuresis   COPD No active wheezing - pt still smoking - initiate scheduled bronchodilators - counseled on need to stop smoking   Nutrition Experienced signif reflux of tube feeds 4/29 evening, w/ tube feed also leaking around the PEG insertion site - KUB unremarkable - attempt to resume tube feeding today    Code Status: FULL Family Communication: no family present at time of exam Disposition Plan: stable for transfer to cardiac tele bed   Consultants: [facility] Cardiology   Procedures: 4/29 TTE - as above  Antibiotics: none  DVT prophylaxis: SQ heparin   Objective: Blood pressure 149/101, pulse 113, temperature 97.5 F (36.4 C), temperature source Oral, resp. rate 16, height 5\' 4"  (1.626 m), weight 64.9 kg (143 lb 1.3 oz), SpO2 93 %.  Intake/Output Summary (Last 24 hours) at 03/06/16 1025 Last data filed at 03/06/16 0846  Gross per 24 hour  Intake 1401.25 ml  Output   1550 ml  Net -  148.75 ml   Exam: General: No acute respiratory distress at rest in bed  Lungs: mild bibasilar crackles - poor air movement th/o w/o wheeze  Cardiovascular: Regular rate and rhythm without murmur gallop or rub  Abdomen: Nontender, nondistended, soft, bowel sounds positive, no rebound, no ascites, no appreciable mass - PEG tube w/ some tube feed staining the dressing at the insertion site - no evidence of cellulitis at insertion site - tube had ~1inch of "play" - I moved the bumper to snug it up to the skin/dressing to remove this play - no pain w/ manipulation of the  tubing Extremities: no significant cyanosis, or clubbing; trace edema bilateral lower extremities  Data Reviewed:  Basic Metabolic Panel:  Recent Labs Lab 03/02/16 1200 03/03/16 1639 03/04/16 0543 03/06/16 0355  NA 134* 133* 137 135  K 3.2* 3.1* 3.5 3.7  CL 95* 94* 98* 96*  CO2 25 27 27 28   GLUCOSE 104* 128* 113* 116*  BUN 41* 40* 36* 39*  CREATININE 2.22* 2.19* 2.22* 2.10*  CALCIUM 8.3* 8.4* 8.4* 8.6*  MG 1.6*  --   --  2.2    CBC:  Recent Labs Lab 03/02/16 1200 03/03/16 1639 03/04/16 0543 03/05/16 0500 03/06/16 0355  WBC 2.4* 4.6 5.5 5.2 5.2  NEUTROABS 1.6* 3.6  --   --   --   HGB 7.1* 8.6* 7.9* 7.5* 8.4*  HCT 20.9* 25.4* 24.0* 22.3* 24.4*  MCV 89.7 88.8 88.6 90.7 88.7  PLT 94* 100* 97* 97* 90*    Liver Function Tests:  Recent Labs Lab 03/02/16 1200 03/03/16 1639 03/06/16 0355  AST 53* 89* 22  ALT 22 27 16   ALKPHOS 93 96 78  BILITOT 0.4 0.5 0.7  PROT 6.4* 6.7 5.7*  ALBUMIN 3.2* 3.3* 2.4*    Cardiac Enzymes:  Recent Labs Lab 03/03/16 1639 03/04/16 0020 03/04/16 0543 03/04/16 1050  TROPONINI 14.22* 21.49* 23.06* 15.86*    CBG:  Recent Labs Lab 03/05/16 0855 03/05/16 1151 03/05/16 1301 03/05/16 1310 03/05/16 1658  GLUCAP 111* 59* 105* 124* 91    Recent Results (from the past 240 hour(s))  MRSA PCR Screening     Status: None   Collection Time: 03/03/16  9:49 PM  Result Value Ref Range Status   MRSA by PCR NEGATIVE NEGATIVE Final    Comment:        The GeneXpert MRSA Assay (FDA approved for NASAL specimens only), is one component of a comprehensive MRSA colonization surveillance program. It is not intended to diagnose MRSA infection nor to guide or monitor treatment for MRSA infections.      Studies:   Recent x-ray studies have been reviewed in detail by the Attending Physician  Scheduled Meds:  Scheduled Meds: . antiseptic oral rinse  7 mL Mouth Rinse q12n4p  . aspirin EC  81 mg Oral Daily  . atorvastatin  40 mg  Oral q1800  . budesonide (PULMICORT) nebulizer solution  0.25 mg Nebulization BID  . chlorhexidine  15 mL Mouth Rinse BID  . fentaNYL  25 mcg Transdermal Q72H  . fluconazole  100 mg Oral Daily  . heparin subcutaneous  5,000 Units Subcutaneous Q8H  . ipratropium-albuterol  3 mL Nebulization QID  . metoprolol tartrate  12.5 mg Oral BID    Time spent on care of this patient: 35 mins   Alera Quevedo T , MD   [facility] Office  [phone] Pager - Text Page per Shea Evans as per below:  On-Call/Text Page:  [email or web address]      password [email or web address]  If 7PM-7AM, please contact night-coverage [email or web address] Password [email or web address] 03/06/2016, 10:25 AM   LOS: 3 days
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
## 2016-03-06 NOTE — Progress Notes (Signed)
Chaplain stopped by to complete an advance directive, However Pt said she "isn't ready". Please page chaplain if Pt. Changes her mind.

## 2016-03-06 NOTE — Progress Notes (Signed)
Cardiologist: Dr. 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 VQ scan showed no evidence of acute pulmonary embolism.  Pt with mild dyspnea; no chest pain  Objective:  Vital Signs in the last 24 hours: Temp:  [98.1 F (36.7 C)-98.7 F (37.1 C)] 98.2 F (36.8 C) (05/01 0338) Pulse Rate:  [97-121] 121 (05/01 0400) Resp:  [13-31] 31 (05/01 0400) BP: (122-153)/(76-100) 143/99 mmHg (05/01 0338) SpO2:  [83 %-97 %] 87 % (05/01 0400) Weight:  [143 lb 1.3 oz (64.9 kg)] 143 lb 1.3 oz (64.9 kg) (05/01 0338)  Intake/Output from previous day: 04/30 0701 - 05/01 0700 In: 1318.8 [P.O.:180; I.V.:293.8; Blood:335; NG/GT:175] Out: 1350 [Urine:1350]   Physical Exam: General: WD chronically ill appearing, NAD Head:  Normocephalic and atraumatic. Neck radiation scarring noted. Tumor right anterior, dressed neck, Port-A-Cath Lungs: Diminished BS throughout Heart: RRR Abdomen: soft, NT, ND Extremities: No edema. Neurologic: Alert and oriented x 3.    Lab Results:  Recent Labs  03/05/16 0500 03/06/16 0355  WBC 5.2 5.2  HGB 7.5* 8.4*  PLT 97* 90*    Recent Labs  03/04/16 0543 03/06/16 0355  NA 137 135  K 3.5 3.7  CL 98* 96*  CO2 27 28  GLUCOSE 113* 116*  BUN 36* 39*  CREATININE 2.22* 2.10*    Recent Labs  03/04/16 0543 03/04/16 1050  TROPONINI 23.06* 15.86*   Hepatic Function Panel  Recent Labs  03/06/16 0355  PROT 5.7*  ALBUMIN 2.4*  AST 22  ALT 16  ALKPHOS 78  BILITOT 0.7    Imaging: Dg Abd Portable 1v  03/05/2016  CLINICAL DATA:  Ileus. EXAM: PORTABLE ABDOMEN - 1 VIEW COMPARISON:  01/17/2016 FINDINGS: The gastrostomy tube is identified within the  projection of the left upper quadrant of the abdomen. The bowel gas pattern appears within normal limits. No dilated loops of small bowel or air-fluid levels identified. Right pleural effusion with overlying airspace consolidation noted. IMPRESSION: 1. Normal bowel gas pattern noted. Electronically Signed   By: Kerby Moors M.D.   On: 03/05/2016 15:51    Telemetry: Sinus   Cardiac Studies:  Echocardiogram 03/04/16: Left ventricle: The cavity size was normal. Systolic function was  mildly to moderately reduced. The estimated ejection fraction was  in the range of 40% to 45%. Severe hypokinesis of the inferior  myocardium. - Left atrium: The atrium was mildly to moderately dilated. - Right atrium: The atrium was mildly dilated. - Tricuspid valve: There was moderate regurgitation. - Pulmonary arteries: Systolic pressure was moderately increased.  PA peak pressure: 48 mm Hg (S).  Meds: Scheduled Meds: . antiseptic oral rinse  7 mL Mouth Rinse q12n4p  . aspirin EC  81 mg Oral Daily  . atorvastatin  40 mg Oral q1800  . budesonide (PULMICORT) nebulizer solution  0.25 mg Nebulization BID  . chlorhexidine  15 mL Mouth Rinse BID  . fentaNYL  25 mcg Transdermal Q72H  . fluconazole  100 mg Oral Daily  . heparin subcutaneous  5,000 Units Subcutaneous Q8H  . ipratropium-albuterol  3 mL Nebulization QID  . metoprolol tartrate  12.5 mg Oral BID   Continuous Infusions:   PRN Meds:.acetaminophen, LORazepam, morphine CONCENTRATE, naphazoline-pheniramine, nitroGLYCERIN, ondansetron (ZOFRAN) IV,  sodium chloride, white petrolatum, zolpidem  Assessment/Plan:   Non-ST elevation myocardial infarction -Plan medical therapy given multiple comorbidities including acute renal insuff, thrombocytopenia, anemia, advanced CA. -ECHO with EF 40-45% inferior wall hypokinesis -Aspirin, low-dose beta blocker and statin. -No ACE-I with ARF Pt can FU with Dr Percival Spanish in [place] after DC  Acute sytolic  CHF -EF [id number] -Bb, no ACE-I (renal fnx). No lasix as she appears to be euvolemic.  Acute renal failure -Creatinine 2 weeks ago was 1.29 -Holding lisinopril as well as hydrochlorothiazide.  Anemia -Continue to follow; transfusion as needed  Advanced squamous cell carcinoma of the neck -Management per primary care.    Kirk Ruths 03/06/2016, 8:16 AM

## 2016-03-06 NOTE — Progress Notes (Signed)
Md paged about patients Afib. Rate 130-150. Patient asymptomatic at this time. Will continue to monitor. B/P 118/91

## 2016-03-06 NOTE — Progress Notes (Signed)
Attempted to call report to [facility], nurse unavailable at this time.

## 2016-03-07 ENCOUNTER — Ambulatory Visit (HOSPITAL_COMMUNITY): Payer: Self-pay

## 2016-03-07 DIAGNOSIS — I10 Essential (primary) hypertension: Secondary | ICD-10-CM | POA: Diagnosis present

## 2016-03-07 DIAGNOSIS — J449 Chronic obstructive pulmonary disease, unspecified: Secondary | ICD-10-CM | POA: Diagnosis present

## 2016-03-07 DIAGNOSIS — I272 Other secondary pulmonary hypertension: Secondary | ICD-10-CM

## 2016-03-07 DIAGNOSIS — I4891 Unspecified atrial fibrillation: Secondary | ICD-10-CM | POA: Diagnosis present

## 2016-03-07 DIAGNOSIS — J9621 Acute and chronic respiratory failure with hypoxia: Secondary | ICD-10-CM | POA: Diagnosis present

## 2016-03-07 DIAGNOSIS — I5043 Acute on chronic combined systolic (congestive) and diastolic (congestive) heart failure: Secondary | ICD-10-CM | POA: Diagnosis present

## 2016-03-07 LAB — BASIC METABOLIC PANEL
ANION GAP: 11 (ref 5–15)
BUN: 46 mg/dL — AB (ref 6–20)
CO2: 28 mmol/L (ref 22–32)
Calcium: 9 mg/dL (ref 8.9–10.3)
Chloride: 95 mmol/L — ABNORMAL LOW (ref 101–111)
Creatinine, Ser: 2.03 mg/dL — ABNORMAL HIGH (ref 0.44–1.00)
GFR calc Af Amer: 30 mL/min — ABNORMAL LOW (ref >60)
GFR, EST NON AFRICAN AMERICAN: 26 mL/min — AB (ref >60)
GLUCOSE: 124 mg/dL — AB (ref 65–99)
POTASSIUM: 3.9 mmol/L (ref 3.5–5.1)
Sodium: 134 mmol/L — ABNORMAL LOW (ref 135–145)

## 2016-03-07 LAB — CBC
HEMATOCRIT: 25.8 % — AB (ref 36.0–46.0)
Hemoglobin: 8.8 g/dL — ABNORMAL LOW (ref 12.0–15.0)
MCH: 30.3 pg (ref 26.0–34.0)
MCHC: 34.1 g/dL (ref 30.0–36.0)
MCV: 89 fL (ref 78.0–100.0)
PLATELETS: 107 10*3/uL — AB (ref 150–400)
RBC: 2.9 MIL/uL — AB (ref 3.87–5.11)
RDW: 15.5 % (ref 11.5–15.5)
WBC: 6.3 10*3/uL (ref 4.0–10.5)

## 2016-03-07 MED ORDER — METOPROLOL TARTRATE 25 MG PO TABS
75.0000 mg | ORAL_TABLET | Freq: Two times a day (BID) | ORAL | Status: DC
  Administered 2016-03-07: 75 mg via ORAL
  Filled 2016-03-07: qty 1

## 2016-03-07 MED ORDER — METOPROLOL TARTRATE 100 MG PO TABS
100.0000 mg | ORAL_TABLET | Freq: Two times a day (BID) | ORAL | Status: DC
  Administered 2016-03-07 – 2016-03-10 (×6): 100 mg via ORAL
  Filled 2016-03-07 (×6): qty 1

## 2016-03-07 MED ORDER — APIXABAN 5 MG PO TABS
5.0000 mg | ORAL_TABLET | Freq: Two times a day (BID) | ORAL | Status: DC
  Administered 2016-03-07 – 2016-03-10 (×7): 5 mg via ORAL
  Filled 2016-03-07 (×7): qty 1

## 2016-03-07 NOTE — Progress Notes (Addendum)
Cardiologist: Dr. 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 VQ scan showed no evidence of acute pulmonary embolism.  Pt with mild dyspnea; no chest pain  Objective:  Vital Signs in the last 24 hours: Temp:  [97.5 F (36.4 C)-99 F (37.2 C)] 97.8 F (36.6 C) (05/02 0400) Pulse Rate:  [94-116] 101 (05/02 0700) Resp:  [10-28] 14 (05/02 0700) BP: (131-154)/(87-102) 132/87 mmHg (05/01 2351) SpO2:  [92 %-100 %] 92 % (05/02 0700) Weight:  [143 lb 3.2 oz (64.955 kg)] 143 lb 3.2 oz (64.955 kg) (05/02 0500)  Intake/Output from previous day: 05/01 0701 - 05/02 0700 In: 1340 [P.O.:1340] Out: 1050 [Urine:1050]   Physical Exam: General: WD chronically ill appearing, NAD Head:  Normocephalic and atraumatic. Neck radiation scarring noted. Tumor right anterior, dressed neck, Port-A-Cath Lungs: Diminished BS throughout Heart: RRR Abdomen: soft, NT, ND Extremities: No edema. Neurologic: Alert and oriented x 3.    Lab Results:  Recent Labs  03/06/16 0355 03/07/16 0526  WBC 5.2 6.3  HGB 8.4* 8.8*  PLT 90* 107*    Recent Labs  03/06/16 0355 03/07/16 0526  NA 135 134*  K 3.7 3.9  CL 96* 95*  CO2 28 28  GLUCOSE 116* 124*  BUN 39* 46*  CREATININE 2.10* 2.03*    Recent Labs  03/04/16 1050  TROPONINI 15.86*   Hepatic Function Panel  Recent Labs  03/06/16 0355  PROT 5.7*  ALBUMIN 2.4*  AST 22  ALT 16  ALKPHOS 78  BILITOT 0.7    Imaging: Dg Abd Portable 1v  03/05/2016  CLINICAL DATA:  Ileus. EXAM: PORTABLE ABDOMEN - 1 VIEW COMPARISON:  01/17/2016 FINDINGS: The gastrostomy tube is identified within the projection of the left upper quadrant of the abdomen. The  bowel gas pattern appears within normal limits. No dilated loops of small bowel or air-fluid levels identified. Right pleural effusion with overlying airspace consolidation noted. IMPRESSION: 1. Normal bowel gas pattern noted. Electronically Signed   By: Kerby Moors M.D.   On: 03/05/2016 15:51    Telemetry: Sinus with PAF   Cardiac Studies:  Echocardiogram 03/04/16: Left ventricle: The cavity size was normal. Systolic function was  mildly to moderately reduced. The estimated ejection fraction was  in the range of 40% to 45%. Severe hypokinesis of the inferior  myocardium. - Left atrium: The atrium was mildly to moderately dilated. - Right atrium: The atrium was mildly dilated. - Tricuspid valve: There was moderate regurgitation. - Pulmonary arteries: Systolic pressure was moderately increased.  PA peak pressure: 48 mm Hg (S).  Meds: Scheduled Meds: . antiseptic oral rinse  7 mL Mouth Rinse q12n4p  . aspirin EC  81 mg Oral Daily  . atorvastatin  40 mg Oral q1800  . budesonide (PULMICORT) nebulizer solution  0.25 mg Nebulization BID  . chlorhexidine  15 mL Mouth Rinse BID  . feeding supplement (OSMOLITE 1.5 CAL)  237 mL Per Tube TID WC & HS  . fentaNYL  25 mcg Transdermal Q72H  . fluconazole  100 mg Oral Daily  . heparin subcutaneous  5,000 Units Subcutaneous Q8H  . ipratropium-albuterol  3 mL Nebulization QID  . metoprolol tartrate  50 mg Oral BID  . sodium chloride  flush  10-40 mL Intracatheter Q12H   Continuous Infusions:   PRN Meds:.acetaminophen, LORazepam, morphine CONCENTRATE, naphazoline-pheniramine, nitroGLYCERIN, ondansetron (ZOFRAN) IV, sodium chloride, sodium chloride flush, white petrolatum, zolpidem  Assessment/Plan:   Non-ST elevation myocardial infarction -Plan medical therapy given multiple comorbidities including acute renal insuff, thrombocytopenia, anemia, advanced CA. -ECHO with EF 40-45% inferior wall hypokinesis -Beta blocker and statin. DC ASA  given need for anticoagulation -No ACE-I with ARF Pt can FU with Dr Percival Spanish in [place] after DC  Acute sytolic CHF -EF [id number] -Bb, no ACE-I (renal fnx). No lasix as she appears to be euvolemic.  Acute renal failure -Creatinine 2 weeks ago was 1.29 -Holding lisinopril as well as hydrochlorothiazide.  Anemia -Continue to follow; transfusion as needed  Advanced squamous cell carcinoma of the neck -Management per primary care.  PAF - Patient with PAF on telemetry last PM; no symptoms; increase metoprolol to 75 BID; embolic risk factors include female sex, CAD, HTN and CHF (CHADSvasc 4). Needs anticoagulation long term. Issue complicated by cancer and need for ongoing chemotherapy causing thrombocytopenia. Plt count reasonable at present. Begin apixaban 5 mg BID; could be DCed if significant thrombocytopenia develops. Will DC asa given need for anticoagulation and borderline plt count. Note recent TSH normal. Can add amiodarone later if needed.    Kirk Ruths 03/07/2016, 7:18 AM

## 2016-03-07 NOTE — Discharge Instructions (Signed)

## 2016-03-07 NOTE — Progress Notes (Signed)
Pt transferred to rm 2w10 at this time.  Pt has no s/s of any acute distress or c/o pain.

## 2016-03-07 NOTE — Progress Notes (Addendum)
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 She was at the [facility] in [place] to get IV fluid and packed red blood cells when she dropped her oxygen saturation on room air to 66% and was sent to the emergency room. In the ED she was found to have a troponin of 14.22. BNP was 2533. VQ demonstrated diffuse bilateral patchy ventilation defects but no perfusion defects to suggest acute pulmonary embolism. She was transferred to [facility] and admitted to the [facility] Cardiology service.   HPI/Subjective: 5/2 A/O 4, feels significant better than at admission. Negative CP, negative SOB  Assessment/Plan: NSTEMI -Cardiology following; secondary to patient's multiple medical problems will manage conservatively. -Increase Metoprolol 100 mg BID -Lipitor 40 mg daily  Chronic Systolic CHF/Pulmonary Hypertension -EF 40-45% via TTE w/ severe hypokinesis of inferior myocardium -Strict in and out; since admission  +2.2 L  -Daily weight ( baseline wgt prior to this admit appears to be ~65kg) Filed Weights   03/05/16 0338 03/06/16 0338 03/07/16 0500  Weight: 65.681 kg (144 lb 12.8 oz) 64.9 kg (143 lb 1.3 oz) 64.955 kg (143 lb 3.2 oz)   - no evidence of volume overload on exam  A. fib with RVR  -Patient now in NSR  -Eliquis 5 mg  BID -See NSTEMI   HTN -BP not yet at goal  -See NSTEMI   Acute respiratory failure with hypoxia  -Pulmonary edema + baseline COPD - still requiring 4L [place] - titrate toward RA as able - diurese gently and watch renal fxn - f/u CXR in AM  COPD -No active wheezing - pt still smoking  - initiate scheduled bronchodilators  - counseled on need to stop smoking   Acute renal failure  (Baseline Cr ~1.29) -Most likely multifactorial to include MI and diuretics -Continue to hold ACE + HCTZ  -slowly improving without diuretics continue to follow  Anemia  -s/p 1U PRBC -w/ CAD (assumed given NSTEMI) - transfuse  for hemoglobin < 8.0   Advanced squamous cell carcinoma R neck - s/p XRT + cisplatin -Currently receiving XRT in [place]. Patient unsure if she's going to continue chemotherapy/XRT post discharge   Nutrition Experienced signif reflux of tube feeds 4/29 evening, w/ tube feed also leaking around the PEG insertion site - KUB unremarkable - attempt to resume tube feeding today   Goals of care -Discussed at length difference between DO NOT RESUSCITATE and full code. After answering all questions Patient decided on DO NOT RESUSCITATE.   Code Status: DO NOT RESUSCITATE Family Communication: no family present at time of exam Disposition Plan: Per cardiology    Consultants: [facility]    Procedure/Significant Events: 4/29 TTE; LVEF= 40% to 45%. Severe hypokinesis of the inferior myocardium. - Left atrium: mildly to moderately dilated. - Right atrium: Tmildly dilated. - Tricuspid valve:  moderate regurgitation. - Pulmonary arteries:  PA peak pressure: 48 mm Hg (S).   Culture NA   Antibiotics: None  DVT prophylaxis: Eliquis   Devices NA  LINES / TUBES:  NA   Continuous Infusions:   Objective: VITAL SIGNS: Temp: 97.8 F (36.6 C) (05/02 1637) Temp Source: Oral (05/02 1637) BP: 134/89 mmHg (05/02 1637) Pulse Rate: 102 (05/02 1700) SPO2; FIO2:  Intake/Output Summary (Last 24 hours) at 03/07/16 1751 Last data filed at 03/07/16 1700  Gross per 24 hour  Intake   1220 ml  Output    625 ml  Net    595 ml     Exam: General: A/O 4, NAD, cachectic, positive chronic respiratory distress Eyes: negative scleral hemorrhage, negative icterus ENT: Negative Runny nose, negative gingival bleeding, Neck:  Negative scars, masses,  torticollis, lymphadenopathy, JVD Lungs: Clear to auscultation bilaterally without wheezes or crackles Cardiovascular: Regular rate and rhythm without murmur gallop or rub normal S1 and S2 Abdomen:negative abdominal pain, nondistended, positive soft, bowel sounds, no rebound, no ascites, no appreciable mass Extremities: No significant cyanosis, clubbing, or edema bilateral lower extremities Psychiatric:  Negative depression, negative anxiety, negative fatigue, negative mania  Neurologic:  Cranial nerves II through XII intact, tongue/uvula midline, all extremities muscle strength 5/5, sensation intact throughout, negative dysarthria, negative expressive aphasia, negative receptive aphasia.   Data Reviewed: Basic Metabolic Panel:  Recent Labs Lab 03/02/16 1200 03/03/16 1639 03/04/16 0543 03/06/16 0355 03/07/16 0526  NA 134* 133* 137 135 134*  K 3.2* 3.1* 3.5 3.7 3.9  CL 95* 94* 98* 96* 95*  CO2 25 27 27 28 28   GLUCOSE 104* 128* 113* 116* 124*  BUN 41* 40* 36* 39* 46*  CREATININE 2.22* 2.19* 2.22* 2.10* 2.03*  CALCIUM 8.3* 8.4* 8.4* 8.6* 9.0  MG 1.6*  --   --  2.2  --    Liver Function Tests:  Recent Labs Lab 03/02/16 1200 03/03/16 1639 03/06/16 0355  AST 53* 89* 22  ALT 22 27 16   ALKPHOS 93 96 78  BILITOT 0.4 0.5 0.7  PROT 6.4* 6.7 5.7*  ALBUMIN 3.2* 3.3* 2.4*   No results for input(s): LIPASE, AMYLASE in the last 168 hours. No results for input(s): AMMONIA in the last 168 hours. CBC:  Recent Labs Lab 03/02/16 1200 03/03/16 1639 03/04/16 0543 03/05/16 0500 03/06/16 0355 03/07/16 0526  WBC 2.4* 4.6 5.5 5.2 5.2 6.3  NEUTROABS 1.6* 3.6  --   --   --   --   HGB 7.1* 8.6* 7.9* 7.5* 8.4* 8.8*  HCT 20.9* 25.4* 24.0* 22.3* 24.4* 25.8*  MCV 89.7 88.8 88.6 90.7 88.7 89.0  PLT 94* 100* 97* 97* 90* 107*   Cardiac Enzymes:  Recent Labs Lab 03/03/16 1639 03/04/16 0020 03/04/16 0543 03/04/16 1050  TROPONINI 14.22* 21.49* 23.06* 15.86*   BNP (last 3  results)  Recent Labs  03/03/16 1639  BNP 2533.0*    ProBNP (last 3 results) No results for input(s): PROBNP in the last 8760 hours.  CBG:  Recent Labs Lab 03/05/16 0855 03/05/16 1151 03/05/16 1301 03/05/16 1310 03/05/16 1658  GLUCAP 111* 59* 105* 124* 91    Recent Results (from the past 240 hour(s))  MRSA PCR Screening     Status: None   Collection Time: 03/03/16  9:49 PM  Result Value Ref Range Status   MRSA by PCR NEGATIVE NEGATIVE Final    Comment:        The GeneXpert MRSA Assay (FDA approved for NASAL specimens only), is one component of a comprehensive MRSA colonization surveillance program. It is not intended to diagnose MRSA infection nor to guide or monitor treatment for MRSA infections.      Studies:  Recent x-ray studies have been reviewed in detail by the Attending Physician  Scheduled Meds:  Scheduled Meds: . antiseptic oral rinse  7 mL Mouth Rinse q12n4p  . apixaban  5 mg  Oral BID  . atorvastatin  40 mg Oral q1800  . budesonide (PULMICORT) nebulizer solution  0.25 mg Nebulization BID  . chlorhexidine  15 mL Mouth Rinse BID  . feeding supplement (OSMOLITE 1.5 CAL)  237 mL Per Tube TID WC & HS  . fentaNYL  25 mcg Transdermal Q72H  . fluconazole  100 mg Oral Daily  . ipratropium-albuterol  3 mL Nebulization QID  . metoprolol tartrate  100 mg Oral BID  . sodium chloride flush  10-40 mL Intracatheter Q12H    Time spent on care of this patient: 40 mins   WOODS, Geraldo Docker , MD  [facility] Office  [phone] Pager - [phone]  On-Call/Text Page:      [email or web address]      password [email or web address]  If 7PM-7AM, please contact night-coverage [email or web address] Password [email or web address] 03/07/2016, 5:51 PM   LOS: 4 days   Care during the described time interval was provided by me .  I have reviewed this patient's available data, including medical history, events of note, physical examination, and all test results as part of my evaluation. I have personally  reviewed and interpreted all radiology studies.   Dia Crawford, MD [phone] Pager

## 2016-03-07 NOTE — Progress Notes (Signed)
Spoke w pt. She has applied for medicaid. Gave pt 30day free eliquis card. Will placed pt assistance form for eliquis on shadow chart for md to sign.

## 2016-03-08 ENCOUNTER — Ambulatory Visit (HOSPITAL_COMMUNITY): Payer: Self-pay

## 2016-03-08 ENCOUNTER — Inpatient Hospital Stay (HOSPITAL_COMMUNITY): Payer: Medicaid Other

## 2016-03-08 DIAGNOSIS — J439 Emphysema, unspecified: Secondary | ICD-10-CM

## 2016-03-08 DIAGNOSIS — I10 Essential (primary) hypertension: Secondary | ICD-10-CM

## 2016-03-08 DIAGNOSIS — I4891 Unspecified atrial fibrillation: Secondary | ICD-10-CM

## 2016-03-08 DIAGNOSIS — C4442 Squamous cell carcinoma of skin of scalp and neck: Secondary | ICD-10-CM

## 2016-03-08 DIAGNOSIS — I5022 Chronic systolic (congestive) heart failure: Secondary | ICD-10-CM

## 2016-03-08 LAB — MAGNESIUM: Magnesium: 2.4 mg/dL (ref 1.7–2.4)

## 2016-03-08 LAB — CBC
HCT: 25.8 % — ABNORMAL LOW (ref 36.0–46.0)
HEMOGLOBIN: 8.5 g/dL — AB (ref 12.0–15.0)
MCH: 29.9 pg (ref 26.0–34.0)
MCHC: 32.9 g/dL (ref 30.0–36.0)
MCV: 90.8 fL (ref 78.0–100.0)
PLATELETS: 126 10*3/uL — AB (ref 150–400)
RBC: 2.84 MIL/uL — AB (ref 3.87–5.11)
RDW: 15.1 % (ref 11.5–15.5)
WBC: 7.8 10*3/uL (ref 4.0–10.5)

## 2016-03-08 LAB — BASIC METABOLIC PANEL
Anion gap: 13 (ref 5–15)
BUN: 54 mg/dL — AB (ref 6–20)
CALCIUM: 8.9 mg/dL (ref 8.9–10.3)
CHLORIDE: 93 mmol/L — AB (ref 101–111)
CO2: 29 mmol/L (ref 22–32)
CREATININE: 2.21 mg/dL — AB (ref 0.44–1.00)
GFR calc Af Amer: 27 mL/min — ABNORMAL LOW (ref >60)
GFR, EST NON AFRICAN AMERICAN: 23 mL/min — AB (ref >60)
GLUCOSE: 113 mg/dL — AB (ref 65–99)
POTASSIUM: 4.4 mmol/L (ref 3.5–5.1)
Sodium: 135 mmol/L (ref 135–145)

## 2016-03-08 LAB — URINALYSIS, ROUTINE W REFLEX MICROSCOPIC
Bilirubin Urine: NEGATIVE
GLUCOSE, UA: NEGATIVE mg/dL
HGB URINE DIPSTICK: NEGATIVE
KETONES UR: NEGATIVE mg/dL
LEUKOCYTES UA: NEGATIVE
Nitrite: NEGATIVE
PROTEIN: NEGATIVE mg/dL
Specific Gravity, Urine: 1.018 (ref 1.005–1.030)
pH: 5 (ref 5.0–8.0)

## 2016-03-08 LAB — BRAIN NATRIURETIC PEPTIDE

## 2016-03-08 LAB — CREATININE, URINE, RANDOM: Creatinine, Urine: 110.49 mg/dL

## 2016-03-08 LAB — SODIUM, URINE, RANDOM

## 2016-03-08 NOTE — Consult Note (Signed)
[name] Renal Consultation Note  Requesting MD: Thereasa Solo Indication for Consultation: AKI  HPI:  Patient is a 60 year old female admitted on 4/28 after having an episode of shortness of breath and desaturation while receiving her chemotherapy infusion at the cancer center in [place]. She has PMH of HTN, HLD, and SCC of the neck s/p radiation and cisplatin chemotherapy.   On admission, patient was found to have an elevated troponin consistent with NSTEMI. Prior to admission, patient had a little decreased energy, but otherwise had no chest pain or shortness of breath. She was found to have an acute rise in her creatinine to 2.22 on admission (baseline previously <1). He creatinine has remained in the 2-2.2 range since admission. She has been making a "normal" amount of urine over the past several day.   Patient started chemotherapy last month. She was not able to eat or drink very much for several days secondary to throat pain from the radiation therapy. She also had significant nausea from her chemotherapy.   CREATININE, SER  Date/Time Value Ref Range Status  03/08/2016 05:30 AM 2.21* 0.44 - 1.00 mg/dL Final  03/07/2016 05:26 AM 2.03* 0.44 - 1.00 mg/dL Final  03/06/2016 03:55 AM 2.10* 0.44 - 1.00 mg/dL Final  03/04/2016 05:43 AM 2.22* 0.44 - 1.00 mg/dL Final  03/03/2016 04:39 PM 2.19* 0.44 - 1.00 mg/dL Final  03/02/2016 12:00 PM 2.22* 0.44 - 1.00 mg/dL Final  02/21/2016 10:23 AM 1.29* 0.44 - 1.00 mg/dL Final  02/17/2016 11:55 AM 1.53* 0.44 - 1.00 mg/dL Final  02/07/2016 10:20 AM 1.50* 0.44 - 1.00 mg/dL Final  02/01/2016 01:25 PM 0.76 0.44 - 1.00 mg/dL Final  01/31/2016 09:43 AM 0.59 0.44 - 1.00 mg/dL Final  01/21/2016 09:47 AM 0.83 0.44 - 1.00 mg/dL Final  01/17/2016 12:14 PM 0.69 0.44 - 1.00 mg/dL Final     PMHx:   Past Medical History  Diagnosis Date  . Essential hypertension   . Arthritis   . Hyperlipidemia   . Squamous cell carcinoma (HCC)     Invasive  - right neck, XRT and Cisplatin - Dr. Whitney Muse  . Edentulous     Multiple dental extractions March 2017  . Centrilobular emphysema ([place])     Past Surgical History  Procedure Laterality Date  . Tonsillectomy    . Knee arthroscopy Right   . Cesarean section    . Mass biopsy Right 12/21/2015    Procedure: NECK MASS BIOPSY;  Surgeon: Leta Baptist, MD;  Location: [facility];  Service: ENT;  Laterality: Right;  . Panendoscopy N/A 12/21/2015    Procedure: PANENDOSCOPY;  Surgeon: Leta Baptist, MD;  Location: [facility];  Service: ENT;  Laterality: N/A;  . Multiple extractions with alveoloplasty N/A 01/13/2016    Procedure: EXTRACTION OF TOOTH #'S 6-12,15, 19-28, 30 WITH ALVEOLOPLASTY AND BILATERAL MAXILLARY TUBEROSITY REDUCTIONS;  Surgeon: Lenn Cal, DDS;  Location: [facility];  Service: Oral Surgery;  Laterality: N/A;    Family Hx:  Family History  Problem Relation Age of Onset  . Alzheimer's disease Mother   . Congestive Heart Failure Father 15    Social History:  reports that she has quit smoking. Her smoking use included Cigarettes. She has a 5 pack-year smoking history. She has never used smokeless tobacco. She reports that she does not drink alcohol or use illicit drugs.  Allergies:  Allergies  Allergen Reactions  . Codeine Nausea Only    Medications: Prior to Admission medications   Medication  Sig Start Date End Date Taking? Authorizing Provider  acetaminophen (TYLENOL) 500 MG tablet Take 1,000 mg by mouth every 6 (six) hours as needed for mild pain or moderate pain.    Yes Historical Provider, MD  CISPLATIN IV Inject into the vein. Reported on 01/21/2016   Yes Historical Provider, MD  diphenhydrAMINE (BENADRYL) 25 MG tablet Take 25 mg by mouth every 6 (six) hours as needed.   Yes Historical Provider, MD  fentaNYL (DURAGESIC) 25 MCG/HR patch Place 1 patch (25 mcg total) onto the skin every 3 (three) days. 02/28/16  Yes Patrici Ranks, MD   lidocaine-prilocaine (EMLA) cream Apply a quarter size amount to port site 1 hour prior to chemo. Do not rub in. Cover with plastic wrap. 01/20/16  Yes Patrici Ranks, MD  lisinopril-hydrochlorothiazide (PRINZIDE,ZESTORETIC) 20-12.5 MG tablet Take 1 tablet by mouth daily. Reported on 02/10/2016   Yes Historical Provider, MD  magnesium oxide (MAG-OX) 400 (241.3 Mg) MG tablet Take 1 tablet (400 mg total) by mouth 2 (two) times daily. 02/10/16  Yes Patrici Ranks, MD  ondansetron (ZOFRAN) 8 MG tablet TAKE 1 TABLET EVERY 8 HOURS AS NEEDED FOR NAUSEA AND VOMITING 02/15/16  Yes Patrici Ranks, MD  oxyCODONE-acetaminophen (PERCOCET) 10-325 MG tablet Take every 2-4 hours as needed for pain Patient taking differently: Take 1 tablet by mouth every 2 (two) hours as needed for pain.  01/31/16  Yes Patrici Ranks, MD  potassium bicarbonate (K-VESCENT) 25 MEQ disintegrating tablet Take 50 mEq by mouth daily.    Yes Historical Provider, MD  prochlorperazine (COMPAZINE) 10 MG tablet Take 1 tablet (10 mg total) by mouth every 6 (six) hours as needed for nausea or vomiting. 02/28/16  Yes Patrici Ranks, MD  Tetrahydrozoline-Zn Sulfate (EYE DROPS ALLERGY RELIEF OP) Apply 1 drop to eye 2 (two) times daily as needed (Itching eyes).   Yes Historical Provider, MD  zolpidem (AMBIEN) 10 MG tablet Take 1 tablet (10 mg total) by mouth at bedtime as needed for sleep. 02/28/16 03/29/16 Yes Patrici Ranks, MD  fluconazole (DIFLUCAN) 100 MG tablet Take 100 mg by mouth daily. STARTING ON 4/11/2-17- TAKE TWO TABLETS, THEN TAKE ONE TABLET EVERY DAY AFTER FOR 20 TOTAL DAYS    Historical Provider, MD  LORazepam (ATIVAN) 0.5 MG tablet Take 1 tablet every 4-6 hours as needed for nausea/vomiting. 02/28/16   Patrici Ranks, MD  lovastatin (MEVACOR) 20 MG tablet Take 20 mg by mouth at bedtime. Reported on 02/17/2016    Historical Provider, MD  morphine 10 MG/5ML solution Take 2.5 mLs (5 mg total) by mouth every 2 (two) hours as  needed for severe pain. Patient not taking: Reported on 03/03/2016 02/28/16   Patrici Ranks, MD    I have reviewed the patient's current medications.  Labs:  Results for orders placed or performed during the hospital encounter of 03/03/16 (from the past 48 hour(s))  CBC     Status: Abnormal   Collection Time: 03/07/16  5:26 AM  Result Value Ref Range   WBC 6.3 4.0 - 10.5 K/uL   RBC 2.90 (L) 3.87 - 5.11 MIL/uL   Hemoglobin 8.8 (L) 12.0 - 15.0 g/dL   HCT 25.8 (L) 36.0 - 46.0 %   MCV 89.0 78.0 - 100.0 fL   MCH 30.3 26.0 - 34.0 pg   MCHC 34.1 30.0 - 36.0 g/dL   RDW 15.5 11.5 - 15.5 %   Platelets 107 (L) 150 - 400 K/uL  Comment: CONSISTENT WITH PREVIOUS RESULT  Basic metabolic panel     Status: Abnormal   Collection Time: 03/07/16  5:26 AM  Result Value Ref Range   Sodium 134 (L) 135 - 145 mmol/L   Potassium 3.9 3.5 - 5.1 mmol/L   Chloride 95 (L) 101 - 111 mmol/L   CO2 28 22 - 32 mmol/L   Glucose, Bld 124 (H) 65 - 99 mg/dL   BUN 46 (H) 6 - 20 mg/dL   Creatinine, Ser 2.03 (H) 0.44 - 1.00 mg/dL   Calcium 9.0 8.9 - 10.3 mg/dL   GFR calc non Af Amer 26 (L) >60 mL/min   GFR calc Af Amer 30 (L) >60 mL/min    Comment: (NOTE) The eGFR has been calculated using the CKD EPI equation. This calculation has not been validated in all clinical situations. eGFR's persistently <60 mL/min signify possible Chronic Kidney Disease.    Anion gap 11 5 - 15  Basic metabolic panel     Status: Abnormal   Collection Time: 03/08/16  5:30 AM  Result Value Ref Range   Sodium 135 135 - 145 mmol/L   Potassium 4.4 3.5 - 5.1 mmol/L   Chloride 93 (L) 101 - 111 mmol/L   CO2 29 22 - 32 mmol/L   Glucose, Bld 113 (H) 65 - 99 mg/dL   BUN 54 (H) 6 - 20 mg/dL   Creatinine, Ser 2.21 (H) 0.44 - 1.00 mg/dL   Calcium 8.9 8.9 - 10.3 mg/dL   GFR calc non Af Amer 23 (L) >60 mL/min   GFR calc Af Amer 27 (L) >60 mL/min    Comment: (NOTE) The eGFR has been calculated using the CKD EPI equation. This calculation  has not been validated in all clinical situations. eGFR's persistently <60 mL/min signify possible Chronic Kidney Disease.    Anion gap 13 5 - 15  CBC     Status: Abnormal   Collection Time: 03/08/16  5:30 AM  Result Value Ref Range   WBC 7.8 4.0 - 10.5 K/uL   RBC 2.84 (L) 3.87 - 5.11 MIL/uL   Hemoglobin 8.5 (L) 12.0 - 15.0 g/dL   HCT 25.8 (L) 36.0 - 46.0 %   MCV 90.8 78.0 - 100.0 fL   MCH 29.9 26.0 - 34.0 pg   MCHC 32.9 30.0 - 36.0 g/dL   RDW 15.1 11.5 - 15.5 %   Platelets 126 (L) 150 - 400 K/uL  Brain natriuretic peptide     Status: Abnormal   Collection Time: 03/08/16  5:30 AM  Result Value Ref Range   B Natriuretic Peptide >4500.0 (H) 0.0 - 100.0 pg/mL  Magnesium     Status: None   Collection Time: 03/08/16  7:00 AM  Result Value Ref Range   Magnesium 2.4 1.7 - 2.4 mg/dL   ROS:  Pertinent items are noted in HPI.  Physical Exam: Filed Vitals:   03/08/16 0720 03/08/16 1127  BP:    Pulse: 95 86  Temp:    Resp: 20 18     General: 60 year old female in NAD, [place] in place HEENT: radiation related changes noted along anterior neck.  Eyes: EOMI Neck: Mass noted over right anterior neck  Heart: RRR, no murmurs noted Lungs: NWOB, CTAB Abdomen: PEG tube in place, S, NT, ND Extremities: No edema Skin: Warm, dry Neuro: Alert and oriented. Moves all extremities spontaneously.   Assessment/Plan: 1. AKI. Cr 2.22 on admission. Baseline appears to be <1. Likely multifactorial in setting of  hypovolemia 2/2 side effects of XRT and chemotherapy in addition to hypoperfusion 2/2 NSTEMI while on an ACE inhibitor. Cisplatin-related nephrotoxicity may also be contributing.  She has been making urine in response to lasix.   - Follow up renal US ordered by primary  - Gentle IVF as tolerated.  - Will follow along, trend renal function.  2. Hypertension/volume. Holding home antihypertensives. Appears euvolemic.   -Monitor UOP 3. Normocytic Anemia / thrombocytopenia / leukopenia. WBC 2.4,  Hgb 8.6, PLT 94 on admission. Previously normal last month. Likely side effect of starting cisplatin infusions last month.   - Monitor CBC 4. CHF with borderline EF. EF40-45%. Monitor fluid status.  5. SCC of the neck. S/p XRT and cisplatin 6. PAF. Chads2vasc 4. Started on eliquis 13m bid. Monitor for signs of bleeding.   CDimas Chyle5/01/2016, 2:17 PM   Renal Attending: This pt had excellent renal fct prior to radiation and chemotherapy with cisplatinum.  She has a PEG for nutritional support.  Her weight has fallen.  She was receiving an ACE-I. She had a NSTEMI. I suspect she has hemodynamically mediated AKI.  I am not sure if the cisplatinum load was enough to cause any renal issues, but certainly the secondary GI side effects could. Supportive care is appropriate. Mikyla Schachter C

## 2016-03-08 NOTE — Progress Notes (Signed)
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 She was at the [facility] in [place] to get IV fluid and packed red blood cells when she dropped her oxygen saturation on room air to 66% and was sent to the emergency room. In the ED she was found to have a troponin of 14.22. BNP was 2533. VQ demonstrated diffuse bilateral patchy ventilation defects but no perfusion defects to suggest acute pulmonary embolism. She was transferred to [facility] and admitted to the [facility] Cardiology service.    Assessment & Plan:   Active Problems:   NSTEMI (non-ST elevated myocardial infarction) (HCC)   Chronic systolic CHF (congestive heart failure) (HCC)   Pulmonary hypertension (HCC)   Atrial fibrillation with RVR (HCC)   Essential hypertension   Acute respiratory failure with hypoxia (HCC)   Chronic obstructive pulmonary disease (HCC)   Acute renal failure (HCC)   Squamous cell carcinoma of neck ([place])   #1 NSTEMI Patient currently chest pain-free. Patient denies any shortness of breath. 2-D echo done with a EF of 40-45%. Severe hypokinesis of the inferior myocardium. Mildly to moderately dilated left atrium. Mildly dilated right atrium. Moderate tricuspid valvular regurgitation. Pulmonary artery systolic pressure was moderately increased with a PA peak pressure 48 mmHg. Patient is being followed by cardiology and due to multiple comorbidities cardiology recommended medical therapy. No ACE inhibitor secondary to acute renal failure. Continue current regimen of beta blocker, statin, apixaban. Per cardiology.  #2 acute renal failure Likely secondary to problem #1 and possible CHF. Patient denies any shortness of breath and patient does not look volume overloaded on  examination. Patient's creatinine was 0.76 on 02/01/2016. Current creatinine is 2.21. Will check a fractional excretion of sodium. Check a BNP. Check a renal ultrasound. Patient is not on an ACE inhibitor. Patient not on diuretics at this time. Patient has not received any contrast during this hospitalization. Consult with nephrology for further evaluation and management.  #3 atrial fibrillation CHADS2VASC score 4 Patient currently in normal sinus rhythm. Continue current dose of metoprolol for rate control. 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 Vision has been started on anticoagulation per cardiology of apixaban 5 mg twice a day with recommendations of discontinuing if significant thrombocytopenia develops. Aspirin has been discontinued. Per cardiology.  #4 hypertension Stable. Continue metoprolol.  #5 hyperlipidemia Continue statin.  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 Outpatient follow-up with oncology.  #7 acute vs chronic systolic CHF EF of [id number]. Patient currently on a beta blocker. Patient does not look volume overloaded on examination and a such note on diuretics at this time. Unable to place on ACE inhibitor secondary to acute renal failure. Per cardiology.  #8 acute respiratory failure with hypoxia Felt to be secondary to pulmonary edema and baseline COPD. Patient on nasal cannula. Repeat chest x-ray in the morning.  #9 COPD Stable. Continue bronchodilators.  #10 anemia Status post 1 unit packed red blood cells. Transfuse for hemoglobin less than 8.     DVT prophylaxis: apixaban Code Status: DO NOT RESUSCITATE Family Communication: Updated patient. No family at bedside. Disposition Plan: Home when medically stable and renal function has improved and close to baseline.   Consultants:  Admitted initially to cardiology 03/03/2016  Nephrology  pending  Procedures:  Abdominal x-rays 03/05/2016  Chest x-ray 03/03/2016  VQ scan 03/03/2016  2-D echo 03/04/2016  Antimicrobials:   None   Subjective: Patient denies any chest pain. No shortness of breath. Patient states has good urine output.  Objective: Filed Vitals:   03/08/16 0648 03/08/16 0720 03/08/16 0731 03/08/16 1127  BP: 124/96     Pulse:  95  86  Temp:      TempSrc:      Resp:  20  18  Height:      Weight:      SpO2:  98% 98% 93%    Intake/Output Summary (Last 24 hours) at 03/08/16 1153 Last data filed at 03/08/16 0300  Gross per 24 hour  Intake    660 ml  Output    350 ml  Net    310 ml   Filed Weights   03/06/16 0338 03/07/16 0500 03/08/16 0551  Weight: 64.9 kg (143 lb 1.3 oz) 64.955 kg (143 lb 3.2 oz) 65.046 kg (143 lb 6.4 oz)    Examination:  General exam: Appears calm and comfortable  Respiratory system: Clear to auscultation. Respiratory effort normal. Cardiovascular system: S1 & S2 heard, RRR. No JVD, murmurs, rubs, gallops or clicks. No pedal edema. Gastrointestinal system: Abdomen is nondistended, soft and nontender. No organomegaly or masses felt. Normal bowel sounds heard. Central nervous system: Alert and oriented. No focal neurological deficits. Extremities: Symmetric 5 x 5 power. Skin: Bandage on right neck. Psychiatry: Judgement and insight appear normal. Mood & affect appropriate.     Data Reviewed: I have personally reviewed following labs and imaging studies  CBC:  Recent Labs Lab 03/02/16 1200 03/03/16 1639 03/04/16 0543 03/05/16 0500 03/06/16 0355 03/07/16 0526 03/08/16 0530  WBC 2.4* 4.6 5.5 5.2 5.2 6.3 7.8  NEUTROABS 1.6* 3.6  --   --   --   --   --   HGB 7.1* 8.6* 7.9* 7.5* 8.4* 8.8* 8.5*  HCT 20.9* 25.4* 24.0* 22.3* 24.4* 25.8* 25.8*  MCV 89.7 88.8 88.6 90.7 88.7 89.0 90.8  PLT 94* 100* 97* 97* 90* 107* 123XX123*   Basic Metabolic Panel:  Recent Labs Lab 03/02/16 1200 03/03/16 1639 03/04/16 0543  03/06/16 0355 03/07/16 0526 03/08/16 0530 03/08/16 0700  NA 134* 133* 137 135 134* 135  --   K 3.2* 3.1* 3.5 3.7 3.9 4.4  --   CL 95* 94* 98* 96* 95* 93*  --   CO2 25 27 27 28 28 29   --   GLUCOSE 104* 128* 113* 116* 124* 113*  --   BUN 41* 40* 36* 39* 46* 54*  --   CREATININE 2.22* 2.19* 2.22* 2.10* 2.03* 2.21*  --   CALCIUM 8.3* 8.4* 8.4* 8.6* 9.0 8.9  --   MG 1.6*  --   --  2.2  --   --  2.4   GFR: Estimated Creatinine Clearance: 23.7 mL/min (by C-G formula based on Cr of 2.21). Liver Function Tests:  Recent Labs Lab 03/02/16 1200 03/03/16 1639 03/06/16 0355  AST 53* 89* 22  ALT 22 27 16   ALKPHOS 93 96 78  BILITOT 0.4 0.5 0.7  PROT 6.4* 6.7 5.7*  ALBUMIN 3.2* 3.3* 2.4*   No results for input(s): LIPASE, AMYLASE in the last 168 hours. No results for input(s): AMMONIA in the last 168 hours. Coagulation Profile: No results for input(s): INR, PROTIME in the last 168 hours. Cardiac Enzymes:  Recent Labs  Lab 03/03/16 1639 03/04/16 0020 03/04/16 0543 03/04/16 1050  TROPONINI 14.22* 21.49* 23.06* 15.86*   BNP (last 3 results) No results for input(s): PROBNP in the last 8760 hours. HbA1C: No results for input(s): HGBA1C in the last 72 hours. CBG:  Recent Labs Lab 03/05/16 0855 03/05/16 1151 03/05/16 1301 03/05/16 1310 03/05/16 1658  GLUCAP 111* 59* 105* 124* 91   Lipid Profile: No results for input(s): CHOL, HDL, LDLCALC, TRIG, CHOLHDL, LDLDIRECT in the last 72 hours. Thyroid Function Tests: No results for input(s): TSH, T4TOTAL, FREET4, T3FREE, THYROIDAB in the last 72 hours. Anemia Panel: No results for input(s): VITAMINB12, FOLATE, FERRITIN, TIBC, IRON, RETICCTPCT in the last 72 hours. Urine analysis: No results found for: COLORURINE, APPEARANCEUR, LABSPEC, [place], GLUCOSEU, HGBUR, BILIRUBINUR, KETONESUR, PROTEINUR, UROBILINOGEN, NITRITE, LEUKOCYTESUR Sepsis Labs: @LABRCNTIP (procalcitonin:4,lacticidven:4)  ) Recent Results (from the past 240  hour(s))  MRSA PCR Screening     Status: None   Collection Time: 03/03/16  9:49 PM  Result Value Ref Range Status   MRSA by PCR NEGATIVE NEGATIVE Final    Comment:        The GeneXpert MRSA Assay (FDA approved for NASAL specimens only), is one component of a comprehensive MRSA colonization surveillance program. It is not intended to diagnose MRSA infection nor to guide or monitor treatment for MRSA infections.          Radiology Studies: No results found.      Scheduled Meds: . antiseptic oral rinse  7 mL Mouth Rinse q12n4p  . apixaban  5 mg Oral BID  . atorvastatin  40 mg Oral q1800  . budesonide (PULMICORT) nebulizer solution  0.25 mg Nebulization BID  . chlorhexidine  15 mL Mouth Rinse BID  . feeding supplement (OSMOLITE 1.5 CAL)  237 mL Per Tube TID WC & HS  . fentaNYL  25 mcg Transdermal Q72H  . ipratropium-albuterol  3 mL Nebulization QID  . metoprolol tartrate  100 mg Oral BID  . sodium chloride flush  10-40 mL Intracatheter Q12H   Continuous Infusions:    LOS: 5 days    Time spent: 54 minutes    THOMPSON,DANIEL, MD [facility] Pager [phone]  If 7PM-7AM, please contact night-coverage [email or web address] Password [email or web address] 03/08/2016, 11:53 AM

## 2016-03-08 NOTE — Progress Notes (Signed)
Cardiologist: Dr. Percival Spanish Subjective:   60 year old female with advanced stage squamous cell carcinoma of the neck treated with over 12 radiation therapies and 2 cycles of cisplatinum who was brought over from [facility] in [place] following an episode of acute dyspnea, troponin elevation of 14, BNP of 2533, elevated creatinine of 2.2 in acute renal failure. VQ scan showed no evidence of acute pulmonary embolism.  Denies dyspnea or chest pain  Objective:  Vital Signs in the last 24 hours: Temp:  [97.7 F (36.5 C)-98.5 F (36.9 C)] 98.5 F (36.9 C) (05/03 0551) Pulse Rate:  [86-105] 86 (05/03 1127) Resp:  [17-26] 18 (05/03 1127) BP: (124-136)/(89-110) 124/96 mmHg (05/03 0648) SpO2:  [92 %-100 %] 93 % (05/03 1127) Weight:  [143 lb 6.4 oz (65.046 kg)] 143 lb 6.4 oz (65.046 kg) (05/03 0551)  Intake/Output from previous day: 05/02 0701 - 05/03 0700 In: 960 [P.O.:600; NG/GT:120] Out: 350 [Urine:350]   Physical Exam: General: WD chronically ill appearing, NAD Head:  Normocephalic and atraumatic. Neck radiation scarring noted. Tumor right anterior, dressed neck, Port-A-Cath Lungs: Diminished BS throughout Heart: RRR Abdomen: soft, NT, ND Extremities: No edema. Neurologic: Alert and oriented x 3.    Lab Results:  Recent Labs  03/07/16 0526 03/08/16 0530  WBC 6.3 7.8  HGB 8.8* 8.5*  PLT 107* 126*    Recent Labs  03/07/16 0526 03/08/16 0530  NA 134* 135  K 3.9 4.4  CL 95* 93*  CO2 28 29  GLUCOSE 124* 113*  BUN 46* 54*  CREATININE 2.03* 2.21*    Hepatic Function Panel  Recent Labs  03/06/16 0355  PROT 5.7*  ALBUMIN 2.4*  AST 22  ALT 16  ALKPHOS 78  BILITOT 0.7     Telemetry: Sinus   Cardiac Studies:  Echocardiogram 03/04/16: Left ventricle: The cavity size was normal. Systolic function was  mildly to moderately reduced. The estimated ejection fraction was  in the range of 40% to 45%. Severe hypokinesis of the inferior  myocardium. -  Left atrium: The atrium was mildly to moderately dilated. - Right atrium: The atrium was mildly dilated. - Tricuspid valve: There was moderate regurgitation. - Pulmonary arteries: Systolic pressure was moderately increased.  PA peak pressure: 48 mm Hg (S).  Meds: Scheduled Meds: . antiseptic oral rinse  7 mL Mouth Rinse q12n4p  . apixaban  5 mg Oral BID  . atorvastatin  40 mg Oral q1800  . budesonide (PULMICORT) nebulizer solution  0.25 mg Nebulization BID  . chlorhexidine  15 mL Mouth Rinse BID  . feeding supplement (OSMOLITE 1.5 CAL)  237 mL Per Tube TID WC & HS  . fentaNYL  25 mcg Transdermal Q72H  . ipratropium-albuterol  3 mL Nebulization QID  . metoprolol tartrate  100 mg Oral BID  . sodium chloride flush  10-40 mL Intracatheter Q12H   Continuous Infusions:   PRN Meds:.acetaminophen, LORazepam, morphine CONCENTRATE, naphazoline-pheniramine, nitroGLYCERIN, ondansetron (ZOFRAN) IV, sodium chloride, sodium chloride flush, white petrolatum, zolpidem  Assessment/Plan:   Non-ST elevation myocardial infarction -Plan medical therapy given multiple comorbidities including acute renal insuff, thrombocytopenia, anemia, advanced CA. -ECHO with EF 40-45% inferior wall hypokinesis -Beta blocker and statin. No ASA given need for anticoagulation -No ACE-I with ARF Pt can FU with Dr Percival Spanish in [place] after DC  Acute sytolic CHF -EF [id number] -Bb, no ACE-I (renal fnx). No lasix as she appears to be euvolemic.  Acute renal failure -Creatinine 2 weeks ago was 1.29 -Holding lisinopril as well as hydrochlorothiazide.  May need nephrology consult; will leave to primary care  Anemia -Continue to follow; transfusion as needed  Advanced squamous cell carcinoma of the neck -Management per primary care.  PAF - Patient with PAF on telemetry previously but remains in sinus now; no symptoms; continue metoprolol; embolic risk factors include female sex, CAD, HTN and CHF (CHADSvasc 4). Needs  anticoagulation long term. Issue complicated by cancer and need for ongoing chemotherapy causing thrombocytopenia. Plt count reasonable at present. Continue apixaban 5 mg BID; could be DCed if significant thrombocytopenia develops. ASA DCed given need for anticoagulation and borderline plt count. Note recent TSH normal. Can add amiodarone later if needed.    Kirk Ruths 03/08/2016, 11:58 AM

## 2016-03-08 NOTE — Progress Notes (Signed)
Physical Therapy Treatment Patient Details Name: Gina Frederick MRN: [id number] DOB: 12/08/1955 Today's Date: 03/08/2016    History of Present Illness 60 year old female with advanced stage squamous cell carcinoma of the neck treated with over 12 radiation therapies and 2 cycles of cisplatinum who was brought over from [facility] in [place] following an episode of acute dyspnea, troponin elevation of 14, BNP of 2533, elevated creatinine of 2.2 in acute renal failure.  Being treated for NSTEMI.    PT Comments    Patient progressing with ambulation tolerance and decreased O2 requirement.  Feel balance and safety improved with RW, but needs more practice for safety and strengthening.  Recommend cardiac rehab to initiate during hospitalization.  Patient may prefer follow up HHPT, however to outpatient cardiac rehab.   Follow Up Recommendations  Home health PT;Supervision/Assistance - 24 hour     Equipment Recommendations  None recommended by PT    Recommendations for Other Services       Precautions / Restrictions Precautions Precautions: Fall Precaution Comments: watch HR    Mobility  Bed Mobility Overal bed mobility: Modified Independent                Transfers Overall transfer level: Needs assistance Equipment used: Rolling walker (2 wheeled) Transfers: Sit to/from Stand Sit to Stand: Supervision         General transfer comment: assist for safety from bed, was standing after toileting in bathroom so stood unaided  Ambulation/Gait Ambulation/Gait assistance: Min guard;Min assist Ambulation Distance (Feet): 250 Feet Assistive device: Rolling walker (2 wheeled) Gait Pattern/deviations: Step-through pattern;Decreased stride length;Drifts right/left     General Gait Details: still veers R with walker, but improved steadiness. Walked in room no device minguard for safety; SpO2 on 2L O2 97%, HR 100; BP 124/54   Stairs            Wheelchair Mobility     Modified Rankin (Stroke Patients Only)       Balance Overall balance assessment: Needs assistance   Sitting balance-Leahy Scale: Good         Standing balance comment: gait with walker in hallway and no device minguard in room                    Cognition Arousal/Alertness: Awake/alert Behavior During Therapy: WFL for tasks assessed/performed Overall Cognitive Status: Within Functional Limits for tasks assessed                      Exercises      General Comments        Pertinent Vitals/Pain Pain Assessment: No/denies pain    Home Living                      Prior Function            PT Goals (current goals can now be found in the care plan section) Progress towards PT goals: Progressing toward goals    Frequency  Min 3X/week    PT Plan Current plan remains appropriate    Co-evaluation             End of Session Equipment Utilized During Treatment: Oxygen Activity Tolerance: Patient tolerated treatment well Patient left: in bed;with call bell/phone within reach     Time: 1550-1608 PT Time Calculation (min) (ACUTE ONLY): 18 min  Charges:  $Gait Training: 8-22 mins  G CodesReginia Naas 03/08/2016, 4:56 PM  Magda Kiel, [name] 03/08/2016

## 2016-03-08 NOTE — Progress Notes (Addendum)
Notified NP Baltazar Najjar about patient's 5 beats of Vtach, Trigeminy and PAC's. No orders were given  Also notified of elevated bp 136/110. Was asked to check manually and text results. It was 124/96.

## 2016-03-08 NOTE — Care Management Note (Signed)
Case Management Note Marvetta Gibbons RN, BSN Unit 2W-Case Manager [phone]  Patient Details  Name: Gina Frederick MRN: [id number] Date of Birth: [birth year]  Subjective/Objective:   Pt admitted with NSTEMI                Action/Plan: PTA Pt lived at home with friend, has been started on Eliquis- previous CM has given 30 day free card to pt- also pt assistance paper work has been placed on shadow chart for MD signature. Pt states that she has applied for medicaid. Per conversation with pt she is followed by Royce Macadamia at [facility] - per pt she prefers to stay at the Health Dept. For f/u as this is more convenient  for pt than coming to the [facility]. Pt reports that she would like to make her own f/u at health dept. Discussed asking Health Dept to assist pt in applying for their MAP program for medication assistance - pt will do this when she goes to her next appointment.  CM to continue to follow for any further d/c needs  Expected Discharge Date:                  Expected Discharge Plan:  Home/Self Care  In-House Referral:     Discharge planning Services  CM Consult, Medication Assistance, [facility]  Post Acute Care Choice:    Choice offered to:     DME Arranged:    DME Agency:     HH Arranged:    HH Agency:     Status of Service:  In process, will continue to follow  Medicare Important Message Given:    Date Medicare IM Given:    Medicare IM give by:    Date Additional Medicare IM Given:    Additional Medicare Important Message give by:     If discussed at [place] of Stay Meetings, dates discussed:    Additional Comments:  Dawayne Patricia, RN 03/08/2016, 2:00 PM

## 2016-03-09 ENCOUNTER — Ambulatory Visit (HOSPITAL_COMMUNITY): Payer: Self-pay

## 2016-03-09 LAB — RENAL FUNCTION PANEL
ANION GAP: 13 (ref 5–15)
Albumin: 2.4 g/dL — ABNORMAL LOW (ref 3.5–5.0)
BUN: 57 mg/dL — ABNORMAL HIGH (ref 6–20)
CHLORIDE: 93 mmol/L — AB (ref 101–111)
CO2: 29 mmol/L (ref 22–32)
Calcium: 9 mg/dL (ref 8.9–10.3)
Creatinine, Ser: 2.04 mg/dL — ABNORMAL HIGH (ref 0.44–1.00)
GFR, EST AFRICAN AMERICAN: 30 mL/min — AB (ref >60)
GFR, EST NON AFRICAN AMERICAN: 26 mL/min — AB (ref >60)
Glucose, Bld: 111 mg/dL — ABNORMAL HIGH (ref 65–99)
PHOSPHORUS: 4.2 mg/dL (ref 2.5–4.6)
POTASSIUM: 4.1 mmol/L (ref 3.5–5.1)
Sodium: 135 mmol/L (ref 135–145)

## 2016-03-09 LAB — CBC
HCT: 25.3 % — ABNORMAL LOW (ref 36.0–46.0)
HEMOGLOBIN: 8.4 g/dL — AB (ref 12.0–15.0)
MCH: 30.5 pg (ref 26.0–34.0)
MCHC: 33.2 g/dL (ref 30.0–36.0)
MCV: 92 fL (ref 78.0–100.0)
Platelets: 167 10*3/uL (ref 150–400)
RBC: 2.75 MIL/uL — AB (ref 3.87–5.11)
RDW: 14.7 % (ref 11.5–15.5)
WBC: 8.3 10*3/uL (ref 4.0–10.5)

## 2016-03-09 MED ORDER — ENSURE ENLIVE PO LIQD
237.0000 mL | Freq: Two times a day (BID) | ORAL | Status: DC
  Administered 2016-03-10: 237 mL via ORAL

## 2016-03-09 MED ORDER — IPRATROPIUM-ALBUTEROL 0.5-2.5 (3) MG/3ML IN SOLN
3.0000 mL | Freq: Three times a day (TID) | RESPIRATORY_TRACT | Status: DC
Start: 2016-03-09 — End: 2016-03-10
  Administered 2016-03-09 – 2016-03-10 (×3): 3 mL via RESPIRATORY_TRACT
  Filled 2016-03-09 (×3): qty 3

## 2016-03-09 MED ORDER — SORBITOL 70 % SOLN
30.0000 mL | Freq: Once | Status: AC
  Administered 2016-03-09: 30 mL via ORAL
  Filled 2016-03-09: qty 30

## 2016-03-09 MED ORDER — BENZONATATE 100 MG PO CAPS
100.0000 mg | ORAL_CAPSULE | Freq: Three times a day (TID) | ORAL | Status: DC
  Administered 2016-03-09 – 2016-03-10 (×3): 100 mg via ORAL
  Filled 2016-03-09 (×3): qty 1

## 2016-03-09 NOTE — Progress Notes (Signed)
Pt complaining of constipation this morning. RN will continue to monitor.  Arnell Sieving, RN
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
## 2016-03-09 NOTE — Progress Notes (Signed)
Cardiologist: Dr. 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 VQ scan showed no evidence of acute pulmonary embolism.  Denies dyspnea or chest pain  Objective:  Vital Signs in the last 24 hours: Temp:  [98.2 F (36.8 C)-99 F (37.2 C)] 98.3 F (36.8 C) (05/04 0657) Pulse Rate:  [86-98] 90 (05/04 0657) Resp:  [16-18] 18 (05/04 0657) BP: (112-138)/(75-93) 127/93 mmHg (05/04 0657) SpO2:  [92 %-98 %] 92 % (05/04 0939) Weight:  [143 lb 8 oz (65.091 kg)] 143 lb 8 oz (65.091 kg) (05/04 0657)  Intake/Output from previous day:     Physical Exam: General: WD chronically ill appearing, NAD Head:  Normal Neck radiation scarring noted. Tumor right anterior, dressed Lungs: Diminished BS throughout Heart: RRR Abdomen: soft, NT, ND Extremities: No edema. Neurologic: Alert and oriented x 3.    Lab Results:  Recent Labs  03/08/16 0530 03/09/16 0450  WBC 7.8 8.3  HGB 8.5* 8.4*  PLT 126* 167    Recent Labs  03/08/16 0530 03/09/16 0450  NA 135 135  K 4.4 4.1  CL 93* 93*  CO2 29 29  GLUCOSE 113* 111*  BUN 54* 57*  CREATININE 2.21* 2.04*    Hepatic Function Panel  Recent Labs  03/09/16 0450  ALBUMIN 2.4*     Telemetry: Sinus with transient atrial flutter   Cardiac Studies:  Echocardiogram 03/04/16: Left ventricle: The cavity size was normal. Systolic function was  mildly to moderately reduced. The estimated ejection fraction was  in the range of 40% to 45%. Severe hypokinesis of the inferior  myocardium. - Left atrium: The atrium was mildly to moderately dilated. - Right atrium: The atrium was mildly dilated. - Tricuspid valve: There was moderate  regurgitation. - Pulmonary arteries: Systolic pressure was moderately increased.  PA peak pressure: 48 mm Hg (S).  Meds: Scheduled Meds: . antiseptic oral rinse  7 mL Mouth Rinse q12n4p  . apixaban  5 mg Oral BID  . atorvastatin  40 mg Oral q1800  . budesonide (PULMICORT) nebulizer solution  0.25 mg Nebulization BID  . chlorhexidine  15 mL Mouth Rinse BID  . feeding supplement (OSMOLITE 1.5 CAL)  237 mL Per Tube TID WC & HS  . fentaNYL  25 mcg Transdermal Q72H  . ipratropium-albuterol  3 mL Nebulization QID  . metoprolol tartrate  100 mg Oral BID  . sodium chloride flush  10-40 mL Intracatheter Q12H   Continuous Infusions:   PRN Meds:.acetaminophen, LORazepam, morphine CONCENTRATE, naphazoline-pheniramine, nitroGLYCERIN, ondansetron (ZOFRAN) IV, sodium chloride, sodium chloride flush, white petrolatum, zolpidem  Assessment/Plan:   Non-ST elevation myocardial infarction -Plan medical therapy given multiple comorbidities including acute renal insuff, thrombocytopenia, anemia, advanced CA. -ECHO with EF 40-45% inferior wall hypokinesis -Beta blocker and statin. No ASA given need for anticoagulation -No ACE-I with ARF Pt can FU with Dr Percival Spanish in [place] after DC  Acute sytolic CHF -EF [id number] -Bb, no ACE-I (renal fnx). No lasix as she appears to be euvolemic.  Acute renal failure -Creatinine 2 weeks ago was 1.29 -Holding lisinopril as well as hydrochlorothiazide.  - Nephrology now following  Anemia -Continue to follow; transfusion as needed  Advanced squamous cell carcinoma of the neck -Management per primary care.  PAF - Patient with PAF on telemetry previously but remains in sinus now; did have transient atrial flutter last PM; no symptoms; continue metoprolol; embolic risk factors include female sex, CAD, HTN and CHF (CHADSvasc 4). Needs anticoagulation long term. Issue complicated by cancer and need for ongoing chemotherapy causing thrombocytopenia. Plt count  reasonable at present. Continue apixaban 5 mg BID; could be DCed if significant thrombocytopenia develops. ASA DCed given need for anticoagulation and borderline plt count. Note recent TSH normal. Can add amiodarone later if needed.    Gina Frederick 03/09/2016, 10:59 AM

## 2016-03-09 NOTE — Care Management Note (Signed)
Case Management Note Marvetta Gibbons RN, BSN Unit 2W-Case Manager [phone]  Patient Details  Name: Gina Frederick MRN: [id number] Date of Birth: [birth year]  Subjective/Objective:   Pt admitted with NSTEMI                Action/Plan: PTA Pt lived at home with friend, has been started on Eliquis- previous CM has given 30 day free card to pt- also pt assistance paper work has been placed on shadow chart for MD signature. Pt states that she has applied for medicaid. Per conversation with pt she is followed by Royce Macadamia at [facility] Dept. - per pt she prefers to stay at the Health Dept. For f/u as this is more convenient  for pt than coming to the [facility]. Pt reports that she would like to make her own f/u at health dept. Discussed asking Health Dept to assist pt in applying for their MAP program for medication assistance - pt will do this when she goes to her next appointment.  CM to continue to follow for any further d/c needs  Expected Discharge Date:                  Expected Discharge Plan:  Home/Self Care  In-House Referral:     Discharge planning Services  CM Consult, Medication Assistance, [place] Acute Care Choice:  Home Health Choice offered to:  Patient  DME Arranged:    DME Agency:     HH Arranged:  RN, Disease Management [place] Agency:  [place]  Status of Service:  In process, will continue to follow  Medicare Important Message Given:    Date Medicare IM Given:    Medicare IM give by:    Date Additional Medicare IM Given:    Additional Medicare Important Message give by:     If discussed at [place] of Stay Meetings, dates discussed:    Additional Comments:  03/09/16- 1600- Marvetta Gibbons RN, BSN - referral for HH-RN/PT/OT/aide received- pt is self pay - and will not qualify for PT/OT/aide- however she could have a HHRN for disease management- spoke with pt at bedside- regarding [place] needs- per pt she states that she  can not afford out of pocket cost for [facility] therapies- discussed that she might could qualify for "charity care" with [facility] who is the provider that works with self pay patients- pt agreeable to see if she would qualify for services with [facility]- call made to Lelan Pons with [facility] for [facility] RN referral for disease management she will come see pt regarding qualifying under "charity care". CM to continue to follow for further d/c needs- [place] assistance form remains on chart for MD signature.   Dawayne Patricia, RN 03/09/2016, 4:16 PM

## 2016-03-09 NOTE — Progress Notes (Signed)
PROGRESS NOTE    Gina Frederick  [id number] DOB: 05/28/1956 DOA: 03/03/2016 PCP: MUSE,ROCHELLE D., PA-C     Brief Narrative:  59 WF PMHx Advanced stage squamous cell carcinoma of the neck moderately to poorly differentiated, S/P 12 rounds radiation therapies and 2 cycles of cis-platinum. She was at the [facility] in [place] to get IV fluid and packed red blood cells when she dropped her oxygen saturation on room air to 66% and was sent to the emergency room. In the ED she was found to have a troponin of 14.22. BNP was 2533. VQ demonstrated diffuse bilateral patchy ventilation defects but no perfusion defects to suggest acute pulmonary embolism. She was transferred to [facility] and admitted to the [facility] Cardiology service.    Assessment & Plan:   Principal Problem:   NSTEMI (non-ST elevated myocardial infarction) ([place]) Active Problems:   Acute renal failure (HCC)   Chronic systolic CHF (congestive heart failure) (HCC)   Pulmonary hypertension (HCC)   Atrial fibrillation with RVR (HCC)   Essential hypertension   Acute respiratory failure with hypoxia (HCC)   Chronic obstructive pulmonary disease (HCC)   Squamous cell carcinoma of neck ([place])   #1 NSTEMI Patient currently chest pain-free. Patient denies any shortness of breath. 2-D echo done with a EF of 40-45%. Severe hypokinesis of the inferior myocardium. Mildly to moderately dilated left atrium. Mildly dilated right atrium. Moderate tricuspid valvular regurgitation. Pulmonary artery systolic pressure was moderately increased with a PA peak pressure 48 mmHg. Patient is being followed by cardiology and due to multiple comorbidities cardiology recommended medical therapy. No ACE inhibitor secondary to acute renal failure. Continue current regimen of beta blocker, statin, apixaban. Per cardiology.  #2 acute renal failure Likely secondary to problem #1 and possible CHF. Patient denies any shortness of breath and patient does not look  volume overloaded on examination. Patient's creatinine was 0.76 on 02/01/2016. Current creatinine is 2.04 from 2.21. Urine sodium was less than 10. Renal ultrasound negative for hydronephrosis. Patient is not on an ACE inhibitor. Patient not on diuretics at this time. Patient has not received any contrast during this hospitalization. Nephrology following and appreciate input and recommendations.   #3 atrial fibrillation CHADS2VASC score 4 Patient currently in normal sinus rhythm. Continue current dose of metoprolol for rate control. 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 Vision has been started on anticoagulation per cardiology of apixaban 5 mg twice a day with recommendations of discontinuing if significant thrombocytopenia develops. Aspirin has been discontinued. Per cardiology.  #4 hypertension Stable. Continue metoprolol.  #5 hyperlipidemia Continue statin.  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 Outpatient follow-up with oncology.  #7 acute vs chronic systolic CHF EF of [id number]. Patient currently on a beta blocker. Patient does not look volume overloaded on examination and a such note on diuretics at this time. Unable to place on ACE inhibitor secondary to acute renal failure. Per cardiology.  #8 acute respiratory failure with hypoxia Felt to be secondary to pulmonary edema and baseline COPD. Patient on nasal cannula. Repeat chest x-ray in the morning.  #9 COPD Stable. Continue bronchodilators.  #10 anemia Status post 1 unit packed red blood cells. Transfuse for hemoglobin less than 8.     DVT prophylaxis: apixaban Code Status: DO NOT RESUSCITATE Family Communication: Updated patient. No family at bedside. Disposition Plan: Home when medically stable and renal function has improved and close to baseline.   Consultants:  Admitted initially to cardiology 03/03/2016  Nephrology Dr  Florene Glen 03/09/2016  Procedures:  Abdominal x-rays 03/05/2016  Chest x-ray 03/03/2016  VQ scan 03/03/2016  2-D echo 03/04/2016  Antimicrobials:   None   Subjective: Patient denies any chest pain. No shortness of breath. Patient states has good urine output.  Objective: Filed Vitals:   03/09/16 [id number] 03/09/16 0938 03/09/16 0939 03/09/16 1115  BP: 127/93     Pulse: 90     Temp: 98.3 F (36.8 C)     TempSrc: Oral     Resp: 18     Height:      Weight: 65.091 kg (143 lb 8 oz)     SpO2: 93% 92% 92% 97%   No intake or output data in the 24 hours ending 03/09/16 1205 Filed Weights   03/07/16 0500 03/08/16 0551 03/09/16 0657  Weight: 64.955 kg (143 lb 3.2 oz) 65.046 kg (143 lb 6.4 oz) 65.091 kg (143 lb 8 oz)    Examination:  General exam: Appears calm and comfortable  Respiratory system: Clear to auscultation. Respiratory effort normal. Cardiovascular system: S1 & S2 heard, RRR. No JVD, murmurs, rubs, gallops or clicks. No pedal edema. Gastrointestinal system: Abdomen is nondistended, soft and nontender. No organomegaly or masses felt. Normal bowel sounds heard. Central nervous system: Alert and oriented. No focal neurological deficits. Extremities: Symmetric 5 x 5 power. Skin: Bandage on right neck. Psychiatry: Judgement and insight appear normal. Mood & affect appropriate.     Data Reviewed: I have personally reviewed following labs and imaging studies  CBC:  Recent Labs Lab 03/03/16 1639  03/05/16 0500 03/06/16 0355 03/07/16 0526 03/08/16 0530 03/09/16 0450  WBC 4.6  < > 5.2 5.2 6.3 7.8 8.3  NEUTROABS 3.6  --   --   --   --   --   --   HGB 8.6*  < > 7.5* 8.4* 8.8* 8.5* 8.4*  HCT 25.4*  < > 22.3* 24.4* 25.8* 25.8* 25.3*  MCV 88.8  < > 90.7 88.7 89.0 90.8 92.0  PLT 100*  < > 97* 90* 107* 126* 167  < > = values in this interval not displayed. Basic Metabolic Panel:  Recent Labs Lab 03/04/16 0543 03/06/16 0355 03/07/16 0526 03/08/16 0530 03/08/16 0700  03/09/16 0450  NA 137 135 134* 135  --  135  K 3.5 3.7 3.9 4.4  --  4.1  CL 98* 96* 95* 93*  --  93*  CO2 27 28 28 29   --  29  GLUCOSE 113* 116* 124* 113*  --  111*  BUN 36* 39* 46* 54*  --  57*  CREATININE 2.22* 2.10* 2.03* 2.21*  --  2.04*  CALCIUM 8.4* 8.6* 9.0 8.9  --  9.0  MG  --  2.2  --   --  2.4  --   PHOS  --   --   --   --   --  4.2   GFR: Estimated Creatinine Clearance: 25.6 mL/min (by C-G formula based on Cr of 2.04). Liver Function Tests:  Recent Labs Lab 03/03/16 1639 03/06/16 0355 03/09/16 0450  AST 89* 22  --   ALT 27 16  --   ALKPHOS 96 78  --   BILITOT 0.5 0.7  --   PROT 6.7 5.7*  --   ALBUMIN 3.3* 2.4* 2.4*   No results for input(s): LIPASE, AMYLASE in the last 168 hours. No results for input(s): AMMONIA in the last 168 hours. Coagulation Profile: No  results for input(s): INR, PROTIME in the last 168 hours. Cardiac Enzymes:  Recent Labs Lab 03/03/16 1639 03/04/16 0020 03/04/16 0543 03/04/16 1050  TROPONINI 14.22* 21.49* 23.06* 15.86*   BNP (last 3 results) No results for input(s): PROBNP in the last 8760 hours. HbA1C: No results for input(s): HGBA1C in the last 72 hours. CBG:  Recent Labs Lab 03/05/16 0855 03/05/16 1151 03/05/16 1301 03/05/16 1310 03/05/16 1658  GLUCAP 111* 59* 105* 124* 91   Lipid Profile: No results for input(s): CHOL, HDL, LDLCALC, TRIG, CHOLHDL, LDLDIRECT in the last 72 hours. Thyroid Function Tests: No results for input(s): TSH, T4TOTAL, FREET4, T3FREE, THYROIDAB in the last 72 hours. Anemia Panel: No results for input(s): VITAMINB12, FOLATE, FERRITIN, TIBC, IRON, RETICCTPCT in the last 72 hours. Urine analysis:    Component Value Date/Time   COLORURINE YELLOW 03/08/2016 1504   APPEARANCEUR CLOUDY* 03/08/2016 1504   LABSPEC 1.018 03/08/2016 1504   PHURINE 5.0 03/08/2016 1504   GLUCOSEU NEGATIVE 03/08/2016 1504   HGBUR NEGATIVE 03/08/2016 1504   BILIRUBINUR NEGATIVE 03/08/2016 1504   KETONESUR NEGATIVE  03/08/2016 1504   PROTEINUR NEGATIVE 03/08/2016 1504   NITRITE NEGATIVE 03/08/2016 1504   LEUKOCYTESUR NEGATIVE 03/08/2016 1504   Sepsis Labs: @LABRCNTIP (procalcitonin:4,lacticidven:4)  ) Recent Results (from the past 240 hour(s))  MRSA PCR Screening     Status: None   Collection Time: 03/03/16  9:49 PM  Result Value Ref Range Status   MRSA by PCR NEGATIVE NEGATIVE Final    Comment:        The GeneXpert MRSA Assay (FDA approved for NASAL specimens only), is one component of a comprehensive MRSA colonization surveillance program. It is not intended to diagnose MRSA infection nor to guide or monitor treatment for MRSA infections.   Urine culture     Status: None (Preliminary result)   Collection Time: 03/08/16  3:04 PM  Result Value Ref Range Status   Specimen Description URINE, RANDOM  Final   Special Requests NONE  Final   Culture NO GROWTH < 24 HOURS  Final   Report Status PENDING  Incomplete         Radiology Studies: US Renal  03/08/2016  CLINICAL DATA:  Elevated creatinine. EXAM: RENAL / URINARY TRACT ULTRASOUND COMPLETE COMPARISON:  PET-CT - 01/19/2016 ; CT the chest, abdomen pelvis - 01/03/2016 FINDINGS: Right Kidney: Normal cortical thickness, echogenicity and size, measuring 10.9 cm in length. No focal renal lesions. No echogenic renal stones. No urinary obstruction. Left Kidney: Normal cortical thickness, echogenicity and size, measuring 11.2 cm in length. No focal renal lesions. No echogenic renal stones. No urinary obstruction. Bladder: Appears normal for degree of bladder distention. IMPRESSION: No explanation for patient's elevated creatinine. Specifically, no evidence of urinary obstruction. Electronically Signed   By: Sandi Mariscal M.D.   On: 03/08/2016 20:41        Scheduled Meds: . antiseptic oral rinse  7 mL Mouth Rinse q12n4p  . apixaban  5 mg Oral BID  . atorvastatin  40 mg Oral q1800  . budesonide (PULMICORT) nebulizer solution  0.25 mg  Nebulization BID  . chlorhexidine  15 mL Mouth Rinse BID  . feeding supplement (OSMOLITE 1.5 CAL)  237 mL Per Tube TID WC & HS  . fentaNYL  25 mcg Transdermal Q72H  . ipratropium-albuterol  3 mL Nebulization TID  . metoprolol tartrate  100 mg Oral BID  . sodium chloride flush  10-40 mL Intracatheter Q12H   Continuous Infusions:    LOS: 6 days  Time spent: 86 minutes    Payslie Mccaig, MD [facility] Pager [phone]  If 7PM-7AM, please contact night-coverage [email or web address] Password [email or web address] 03/09/2016, 12:05 PM

## 2016-03-09 NOTE — Progress Notes (Signed)
Subjective:  Feels about the same this morning. No chest pain or shortness of breath.   Objective Vital signs in last 24 hours: Filed Vitals:   03/08/16 2108 03/08/16 2118 03/08/16 2258 03/09/16 0657  BP: 112/75  118/93 127/93  Pulse: 91  98 90  Temp: 99 F (37.2 C)   98.3 F (36.8 C)  TempSrc: Oral   Oral  Resp: 16   18  Height:      Weight:    143 lb 8 oz (65.091 kg)  SpO2: 96% 94% 98% 93%   Weight change: 1.6 oz (0.045 kg) No intake or output data in the 24 hours ending 03/09/16 0757  Physical Exam: General: 60 year old female in NAD, [place] in place Heart: RRR, no murmurs Lungs:CTA Abdomen:PEG tube in place, S, NT, Nd Extremities: No edema  Labs: Basic Metabolic Panel:  Recent Labs Lab 03/07/16 0526 03/08/16 0530 03/09/16 0450  NA 134* 135 135  K 3.9 4.4 4.1  CL 95* 93* 93*  CO2 28 29 29   GLUCOSE 124* 113* 111*  BUN 46* 54* 57*  CREATININE 2.03* 2.21* 2.04*  CALCIUM 9.0 8.9 9.0  PHOS  --   --  4.2   Liver Function Tests:  Recent Labs Lab 03/02/16 1200 03/03/16 1639 03/06/16 0355 03/09/16 0450  AST 53* 89* 22  --   ALT 22 27 16   --   ALKPHOS 93 96 78  --   BILITOT 0.4 0.5 0.7  --   PROT 6.4* 6.7 5.7*  --   ALBUMIN 3.2* 3.3* 2.4* 2.4*   No results for input(s): LIPASE, AMYLASE in the last 168 hours. No results for input(s): AMMONIA in the last 168 hours. CBC:  Recent Labs Lab 03/02/16 1200 03/03/16 1639  03/05/16 0500 03/06/16 0355 03/07/16 0526 03/08/16 0530 03/09/16 0450  WBC 2.4* 4.6  < > 5.2 5.2 6.3 7.8 8.3  NEUTROABS 1.6* 3.6  --   --   --   --   --   --   HGB 7.1* 8.6*  < > 7.5* 8.4* 8.8* 8.5* 8.4*  HCT 20.9* 25.4*  < > 22.3* 24.4* 25.8* 25.8* 25.3*  MCV 89.7 88.8  < > 90.7 88.7 89.0 90.8 92.0  PLT 94* 100*  < > 97* 90* 107* 126* 167  < > = values in this interval not displayed. Cardiac Enzymes:  Recent Labs Lab 03/03/16 1639 03/04/16 0020 03/04/16 0543 03/04/16 1050  TROPONINI 14.22* 21.49* 23.06* 15.86*   CBG:  Recent  Labs Lab 03/05/16 0855 03/05/16 1151 03/05/16 1301 03/05/16 1310 03/05/16 1658  GLUCAP 111* 59* 105* 124* 91    Iron Studies: No results for input(s): IRON, TIBC, TRANSFERRIN, FERRITIN in the last 72 hours. Studies/Results: US Renal  03/08/2016  CLINICAL DATA:  Elevated creatinine. EXAM: RENAL / URINARY TRACT ULTRASOUND COMPLETE COMPARISON:  PET-CT - 01/19/2016 ; CT the chest, abdomen pelvis - 01/03/2016 FINDINGS: Right Kidney: Normal cortical thickness, echogenicity and size, measuring 10.9 cm in length. No focal renal lesions. No echogenic renal stones. No urinary obstruction. Left Kidney: Normal cortical thickness, echogenicity and size, measuring 11.2 cm in length. No focal renal lesions. No echogenic renal stones. No urinary obstruction. Bladder: Appears normal for degree of bladder distention. IMPRESSION: No explanation for patient's elevated creatinine. Specifically, no evidence of urinary obstruction. Electronically Signed   By: Sandi Mariscal M.D.   On: 03/08/2016 20:41   Medications: Infusions:    Scheduled Medications: . antiseptic oral rinse  7 mL Mouth  Rinse q12n4p  . apixaban  5 mg Oral BID  . atorvastatin  40 mg Oral q1800  . budesonide (PULMICORT) nebulizer solution  0.25 mg Nebulization BID  . chlorhexidine  15 mL Mouth Rinse BID  . feeding supplement (OSMOLITE 1.5 CAL)  237 mL Per Tube TID WC & HS  . fentaNYL  25 mcg Transdermal Q72H  . ipratropium-albuterol  3 mL Nebulization QID  . metoprolol tartrate  100 mg Oral BID  . sodium chloride flush  10-40 mL Intracatheter Q12H   I have reviewed scheduled and prn medications.   Assessment/ Plan: Pt is a 60 y.o. yo female who was admitted on 03/03/2016 with NSTEMI and AKI. PMH of HTN, HLD, and SCC of the neck s/p radiation and cisplatin chemotherapy  1. AKI. Cr 2.22 on admission. Baseline appears to be <1. Likely hemodynamically mediated AKI in the setting of hypovolemia 2/2 side effects of XRT and chemotherapy in addition  to hypoperfusion 2/2 NSTEMI while on an ACE inhibitor. Cisplatin-related nephrotoxicity may also be contributing.  - Recommend gentle IVF as tolerated. - Will follow along, trend renal function.  2. Hypertension/volume. Holding home antihypertensives. Appears euvolemic.  -Monitor UOP 3. Pancytopenia. WBC 2.4, Hgb 8.6, PLT 94 on admission. Previously normal last month. Likely side effect of starting cisplatin infusions last month.  - Monitor CBC 4. CHF with borderline EF. EF40-45%. Monitor fluid status.  5. SCC of the neck. S/p XRT and cisplatin 6. PAF. Chads2vasc 4. Started on eliquis 5mg  bid. Monitor for signs of bleeding.   Algis Greenhouse. Jerline Pain, [place] Resident PGY-2 03/09/2016 8:00 AM   LOS: 6 days   Renal Attending; Nonoliguric AKI with stable kidney fct.  Supportive care Endora Teresi C

## 2016-03-10 ENCOUNTER — Ambulatory Visit (HOSPITAL_COMMUNITY): Payer: Self-pay

## 2016-03-10 DIAGNOSIS — I509 Heart failure, unspecified: Secondary | ICD-10-CM

## 2016-03-10 LAB — URINE CULTURE

## 2016-03-10 LAB — RENAL FUNCTION PANEL
ALBUMIN: 2.3 g/dL — AB (ref 3.5–5.0)
ANION GAP: 14 (ref 5–15)
BUN: 53 mg/dL — AB (ref 6–20)
CO2: 31 mmol/L (ref 22–32)
Calcium: 9.1 mg/dL (ref 8.9–10.3)
Chloride: 93 mmol/L — ABNORMAL LOW (ref 101–111)
Creatinine, Ser: 1.92 mg/dL — ABNORMAL HIGH (ref 0.44–1.00)
GFR, EST AFRICAN AMERICAN: 32 mL/min — AB (ref >60)
GFR, EST NON AFRICAN AMERICAN: 27 mL/min — AB (ref >60)
Glucose, Bld: 106 mg/dL — ABNORMAL HIGH (ref 65–99)
PHOSPHORUS: 4.6 mg/dL (ref 2.5–4.6)
POTASSIUM: 4 mmol/L (ref 3.5–5.1)
Sodium: 138 mmol/L (ref 135–145)

## 2016-03-10 MED ORDER — ENSURE ENLIVE PO LIQD: 237.0000 mL | Freq: Two times a day (BID) | ORAL | Status: AC

## 2016-03-10 MED ORDER — ATORVASTATIN CALCIUM 40 MG PO TABS: 40.0000 mg | ORAL_TABLET | Freq: Every day | ORAL | Status: AC

## 2016-03-10 MED ORDER — BENZONATATE 100 MG PO CAPS: 100.0000 mg | ORAL_CAPSULE | Freq: Three times a day (TID) | ORAL | Status: DC | PRN

## 2016-03-10 MED ORDER — POLYETHYLENE GLYCOL 3350 17 G PO PACK: 17.0000 g | PACK | Freq: Every day | ORAL | Status: DC

## 2016-03-10 MED ORDER — APIXABAN 5 MG PO TABS: 5.0000 mg | ORAL_TABLET | Freq: Two times a day (BID) | ORAL | Status: DC

## 2016-03-10 MED ORDER — METOPROLOL TARTRATE 100 MG PO TABS: 100.0000 mg | ORAL_TABLET | Freq: Two times a day (BID) | ORAL | Status: DC

## 2016-03-10 MED ORDER — BUDESONIDE-FORMOTEROL FUMARATE 160-4.5 MCG/ACT IN AERO: 2.0000 | INHALATION_SPRAY | Freq: Two times a day (BID) | RESPIRATORY_TRACT | Status: DC

## 2016-03-10 MED ORDER — POLYETHYLENE GLYCOL 3350 17 G PO PACK
17.0000 g | PACK | Freq: Every day | ORAL | Status: DC
  Administered 2016-03-10: 17 g via ORAL
  Filled 2016-03-10: qty 1

## 2016-03-10 MED ORDER — NITROGLYCERIN 0.4 MG SL SUBL: 0.4000 mg | SUBLINGUAL_TABLET | SUBLINGUAL | Status: AC | PRN

## 2016-03-10 MED ORDER — IPRATROPIUM-ALBUTEROL 0.5-2.5 (3) MG/3ML IN SOLN: 3.0000 mL | RESPIRATORY_TRACT | Status: DC | PRN

## 2016-03-10 MED ORDER — TIOTROPIUM BROMIDE MONOHYDRATE 18 MCG IN CAPS: 18.0000 ug | ORAL_CAPSULE | Freq: Every day | RESPIRATORY_TRACT | Status: DC

## 2016-03-10 NOTE — Care Management Note (Signed)
Case Management Note Marvetta Gibbons RN, BSN Unit 2W-Case Manager [phone]  Patient Details  Name: Ilan Guyse MRN: [id number] Date of Birth: [birth year]  Subjective/Objective:   Pt admitted with NSTEMI                Action/Plan: PTA Pt lived at home with friend, has been started on Eliquis- previous CM has given 30 day free card to pt- also pt assistance paper work has been placed on shadow chart for MD signature. Pt states that she has applied for medicaid. Per conversation with pt she is followed by Royce Macadamia at [facility]. - per pt she prefers to stay at the Health Dept. For f/u as this is more convenient  for pt than coming to the [facility]. Pt reports that she would like to make her own f/u at health dept. Discussed asking Health Dept to assist pt in applying for their MAP program for medication assistance - pt will do this when she goes to her next appointment.  CM to continue to follow for any further d/c needs  Expected Discharge Date:     03/10/16             Expected Discharge Plan:  Home/Self Care  In-House Referral:     Discharge planning Services  CM Consult, Medication Assistance, [facility], [facility]  Post Acute Care Choice:  Home Health Choice offered to:  Patient  DME Arranged:    DME Agency:     HH Arranged:  RN, Disease Management [place] Agency:  [place]  Status of Service:  Completed, signed off  Medicare Important Message Given:    Date Medicare IM Given:    Medicare IM give by:    Date Additional Medicare IM Given:    Additional Medicare Important Message give by:     If discussed at [place] of Stay Meetings, dates discussed:    Additional Comments:  03/10/16- 1530- Marvetta Gibbons RN, BSN - pt for d/c today- HH has been arranged with [facility]- for [facility]- assistance form for eliquis given to pt to take to Cardiology f/u apointment- pt will need assistance with d/c meds- MATCH letter given to pt for assistance  $3 copay per script discussed with pt- and instructions regarding program given to pt.   03/09/16- 1600- Marvetta Gibbons RN, BSN - referral for HH-RN/PT/OT/aide received- pt is self pay - and will not qualify for PT/OT/aide- however she could have a HHRN for disease management- spoke with pt at bedside- regarding [place] needs- per pt she states that she can not afford out of pocket cost for [facility] therapies- discussed that she might could qualify for "charity care" with [facility] who is the provider that works with self pay patients- pt agreeable to see if she would qualify for services with [facility]- call made to Lelan Pons with [facility] for [facility] RN referral for disease management she will come see pt regarding qualifying under "charity care". CM to continue to follow for further d/c needs- [place] assistance form remains on chart for MD signature.   Dawayne Patricia, RN 03/10/2016, 4:32 PM

## 2016-03-10 NOTE — Discharge Summary (Signed)
Physician Discharge Summary  Gina Frederick [id number] DOB: May 02, 1956 DOA: 03/03/2016  PCP: MUSE,ROCHELLE D., PA-C  Admit date: 03/03/2016 Discharge date: 03/10/2016  Time spent: 65 minutes  Recommendations for Outpatient Follow-up:  1. Follow-up with Dr.Hochrein, cardiology and [place] in 4 weeks. 2. Follow-up with MUSE,ROCHELLE D., PA-C in 1-2 weeks. On follow-up patient will need a basic metabolic profile done to follow-up on electrolytes and renal function. Patient needs a magnesium level checked. Patient will also need a CBC done to follow-up on hemoglobin and platelet count. 3. Follow-up with Dr. Whitney Muse, oncology in 2 weeks or as previously scheduled. On follow-up patient will need a CBC done to follow-up on counts.   Discharge Diagnoses:  Principal Problem:   NSTEMI (non-ST elevated myocardial infarction) ([place]) Active Problems:   Acute renal failure (HCC)   Chronic systolic CHF (congestive heart failure) (HCC)   Pulmonary hypertension (HCC)   Atrial fibrillation with RVR (HCC)   Essential hypertension   Acute respiratory failure with hypoxia (HCC)   Chronic obstructive pulmonary disease (HCC)   Squamous cell carcinoma of neck (HCC)   Acute heart failure ([place])   ARF (acute renal failure) ([place])   Discharge Condition: Stable and improved  Diet recommendation: Heart healthy  Filed Weights   03/08/16 0551 03/09/16 0657 03/10/16 0449  Weight: 65.046 kg (143 lb 6.4 oz) 65.091 kg (143 lb 8 oz) 64.5 kg (142 lb 3.2 oz)    History of present illness:  Per Dr Percival Spanish The patient has no past cardiac history. 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 She stated she's undergone about 12 radiation therapies. She's had 2 cycles of cis-platinum. She has had a biopsy but no other surgery on this. On the day of admission, she was at the [place] [place] to get IV fluid and packed red blood cells. Apparently during therapy she dropped her oxygen  saturation on room air was 66%. She was started on 4 L of O2 and sent to the emergency room. There she was found to have an elevated troponin of 14.22. BNP was 2533. He was treated with heparin, Lasix, aspirin and nitroglycerin patch. VQ demonstrated diffuse bilateral patchy ventilation defects and no wedge shaped peripheral perfusion defects to suggest acute pulmonary embolism.   She reported no past cardiac workup or history. She said she hadn't been feeling quite herself the last week prior to admission. She had decreased energy. However, she didn't become short of breath until the day of admission. She didn't have any chest discomfort. On the day of admission, she did feel like she had some pressure in her abdomen when she was going to throw up but she did not have neck or arm discomfort. She hasn't had any palpitations, presyncope or syncope. She had no PND or orthopnea.  Hospital Course:  #1 NSTEMI Patient was admitted with elevated troponins of 14 and a BNP of 2533 with a creatinine of 2.2. VQ scan which was done was negative for acute PE. Patient was initially admitted per cardiology service. 2-D echo done with a EF of 40-45%. Severe hypokinesis of the inferior myocardium. Mildly to moderately dilated left atrium. Mildly dilated right atrium. Moderate tricuspid valvular regurgitation. Pulmonary artery systolic pressure was moderately increased with a PA peak pressure 48 mmHg. Patient is being followed by cardiology and due to multiple comorbidities cardiology recommended medical therapy. No ACE inhibitor secondary to acute renal failure. Patient was maintained on a regimen of beta blocker, statin, apixaban. Aspirin was  discontinued. Patient improved clinically did not have any further chest pain or shortness of breath during the hospitalization. Patient be discharged home in stable and improved condition with outpatient follow-up with cardiology in 4 weeks.   #2 acute renal failure During the  hospitalization patient was noted to go into acute renal failure with baseline creatinine previously less than 1. Patient's renal function during the hospitalization remained in the 2-2.22 range. Likely multifactorial secondary to problem #1(NSTEMI) leading to hypoperfusion while on ACE inhibitor, hypovolemia secondary to side effects of chemotherapy and XRT, and possibly ?? cisplatin-related nephrotoxicity may be contributing and possible CHF. Patient denied any shortness of breath and patient does not look volume overloaded on examination. Patient's creatinine was 0.76 on 02/01/2016. Urine sodium was less than 10. Renal ultrasound negative for hydronephrosis. Patient is not on an ACE inhibitor. Patient not on diuretics at this time. Patient has not received any contrast during this hospitalization. Nephrology consultation was obtained and patient was seen in consultation by nephrology. Patient renal function started to tremble back down such that by day of discharge patient's creatinine was 1.92. Outpatient follow-up.  #3 paroxysmal atrial fibrillation CHADS2VASC score 4 During the hospitalization patient went into paroxysmal atrial fibrillation however remained asymptomatic. Patient was on metoprolol and dose increase for better rate control. Patient also noted to have embolic risk factors of female sex, coronary artery disease, hypertension, CHF and was recommended that patient needed long term anticoagulation. Patient convert her back into normal sinus rhythm. Anticoagulation was recommended per cardiology and patient started on apixaban. Aspirin was discontinued. 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 Patient has been started on anticoagulation per cardiology of apixaban 5 mg twice a day with recommendations of discontinuing if significant thrombocytopenia develops. Outpatient follow-up with cardiology.  #4 hypertension Stable. Patient's home  regimen of antihypertensive medications of diuretics and ACE inhibitor were held secondary to acute renal failure. Patient was placed on metoprolol. Patient's blood pressure remained stable. Outpatient follow-up.   #5 hyperlipidemia Patient was maintained on a statin during the hospitalization. Outpatient follow-up.   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 Outpatient follow-up with oncology.  #7 acute vs chronic systolic CHF EF of [id number] per 2-D echo. Patient was followed by cardiology and placed on the beta blocker. Patient was diuresed gently with clinical improvement. Diuretics were subsequently discontinued. Patient did not look volume overloaded on examination and a such was not placed on any diuretics. Unable to place on ACE inhibitor secondary to acute renal failure. Patient was followed by cardiology throughout the hospitalization and recommended to continue beta blocker as well as statin. Patient will follow-up with cardiology in the outpatient setting.   #8 acute respiratory failure with hypoxia Felt to be secondary to pulmonary edema and baseline COPD. Patient improved clinically was maintained on nasal cannula. Patient noted to have some pulmonary edema on chest x-ray which improved. Patient was diuresed gently initially with clinical improvement. Iritis was subsequently held. Patient maintained on bronchodilators. Patient improved clinically and was back to baseline by day of discharge.   #9 COPD Stable. Patient was maintained on bronchodilators during the hospitalization. Patient be discharged on Symbicort and Spiriva.   #10 anemia Status post 1 unit packed red blood cells. Hemoglobin stabilized at 8.4.      Procedures:  Abdominal x-rays 03/05/2016  Chest x-ray 03/03/2016  VQ scan 03/03/2016  2-D echo 03/04/2016    Consultations:  Admitted initially  to cardiology 03/03/2016  Nephrology Dr Florene Glen 03/09/2016  Discharge  Exam: Filed Vitals:   03/10/16 0449 03/10/16 1002  BP: 130/96 126/76  Pulse: 85 91  Temp: 98.1 F (36.7 C)   Resp: 18     General: NAD Cardiovascular: RRR Respiratory: CTAB  Discharge Instructions   Discharge Instructions    Diet - low sodium heart healthy    Complete by:  As directed      Discharge instructions    Complete by:  As directed   Follow up with MUSE,ROCHELLE D., PA-C in 1-2 weeks. Follow up with Dr Percival Spanish in [place] in 1 month. Follow up with oncologist as scheduled or in 2 weeks.     Increase activity slowly    Complete by:  As directed           Current Discharge Medication List    START taking these medications   Details  apixaban (ELIQUIS) 5 MG TABS tablet Take 1 tablet (5 mg total) by mouth 2 (two) times daily. Qty: 60 tablet, Refills: 3    atorvastatin (LIPITOR) 40 MG tablet Take 1 tablet (40 mg total) by mouth daily at 6 PM. Qty: 30 tablet, Refills: 3    benzonatate (TESSALON) 100 MG capsule Take 1 capsule (100 mg total) by mouth 3 (three) times daily as needed for cough. Qty: 20 capsule, Refills: 0    budesonide-formoterol (SYMBICORT) 160-4.5 MCG/ACT inhaler Inhale 2 puffs into the lungs 2 (two) times daily. Qty: 1 Inhaler, Refills: 3    feeding supplement, ENSURE ENLIVE, (ENSURE ENLIVE) LIQD Take 237 mLs by mouth 2 (two) times daily between meals. Qty: 237 mL, Refills: 0    metoprolol (LOPRESSOR) 100 MG tablet Take 1 tablet (100 mg total) by mouth 2 (two) times daily. Qty: 60 tablet, Refills: 3    nitroGLYCERIN (NITROSTAT) 0.4 MG SL tablet Place 1 tablet (0.4 mg total) under the tongue every 5 (five) minutes x 3 doses as needed for chest pain. Qty: 15 tablet, Refills: 0    polyethylene glycol (MIRALAX / GLYCOLAX) packet Take 17 g by mouth daily. Qty: 14 each, Refills: 0    tiotropium (SPIRIVA HANDIHALER) 18 MCG inhalation capsule Place 1 capsule (18 mcg total) into inhaler and inhale daily. Qty: 30 capsule, Refills: 3      CONTINUE  these medications which have NOT CHANGED   Details  acetaminophen (TYLENOL) 500 MG tablet Take 1,000 mg by mouth every 6 (six) hours as needed for mild pain or moderate pain.     CISPLATIN IV Inject into the vein. Reported on 01/21/2016   Associated Diagnoses: Squamous cell carcinoma of head and neck (HCC)    diphenhydrAMINE (BENADRYL) 25 MG tablet Take 25 mg by mouth every 6 (six) hours as needed.    fentaNYL (DURAGESIC) 25 MCG/HR patch Place 1 patch (25 mcg total) onto the skin every 3 (three) days. Qty: 10 patch, Refills: 0    lidocaine-prilocaine (EMLA) cream Apply a quarter size amount to port site 1 hour prior to chemo. Do not rub in. Cover with plastic wrap. Qty: 30 g, Refills: 3   Associated Diagnoses: Squamous cell carcinoma of head and neck (HCC)    magnesium oxide (MAG-OX) 400 (241.3 Mg) MG tablet Take 1 tablet (400 mg total) by mouth 2 (two) times daily. Qty: 60 tablet, Refills: 3    ondansetron (ZOFRAN) 8 MG tablet TAKE 1 TABLET EVERY 8 HOURS AS NEEDED FOR NAUSEA AND VOMITING Qty: 30 tablet, Refills: 6    oxyCODONE-acetaminophen (  PERCOCET) 10-325 MG tablet Take every 2-4 hours as needed for pain Qty: 60 tablet, Refills: 0    prochlorperazine (COMPAZINE) 10 MG tablet Take 1 tablet (10 mg total) by mouth every 6 (six) hours as needed for nausea or vomiting. Qty: 30 tablet, Refills: 2   Associated Diagnoses: Squamous cell carcinoma of head and neck (HCC)    Tetrahydrozoline-Zn Sulfate (EYE DROPS ALLERGY RELIEF OP) Apply 1 drop to eye 2 (two) times daily as needed (Itching eyes).    zolpidem (AMBIEN) 10 MG tablet Take 1 tablet (10 mg total) by mouth at bedtime as needed for sleep. Qty: 30 tablet, Refills: 0    fluconazole (DIFLUCAN) 100 MG tablet Take 100 mg by mouth daily. STARTING ON 4/11/2-17- TAKE TWO TABLETS, THEN TAKE ONE TABLET EVERY DAY AFTER FOR 20 TOTAL DAYS    LORazepam (ATIVAN) 0.5 MG tablet Take 1 tablet every 4-6 hours as needed for nausea/vomiting. Qty: 45  tablet, Refills: 0   Associated Diagnoses: Squamous cell carcinoma of head and neck ([place]); Anemia, unspecified anemia type      STOP taking these medications     lisinopril-hydrochlorothiazide (PRINZIDE,ZESTORETIC) 20-12.5 MG tablet      potassium bicarbonate (K-VESCENT) 25 MEQ disintegrating tablet      lovastatin (MEVACOR) 20 MG tablet      morphine 10 MG/5ML solution        Allergies  Allergen Reactions  . Codeine Nausea Only   Follow-up Information    Follow up with [facility]   Why:  call for f/u appointment-  ask about MAP program for medication assistance   Contact information:   [address] [place] [address] [phone]       Follow up with [place].   Why:  HHRN- disease management- arranged   Contact information:   [address] [place] [address] [phone]       Follow up with MUSE,ROCHELLE D., PA-C. Schedule an appointment as soon as possible for a visit in 2 weeks.   Contact information:   [address] [place] [address] [phone]       Follow up with Molli Hazard, MD.   Specialties:  Hematology and Oncology, Oncology   Why:  f/u in 2 weeks or as scheduled.   Contact information:   [place] [address] [phone]       Follow up with Minus Breeding, MD. Schedule an appointment as soon as possible for a visit in 1 month.   Specialty:  Cardiology   Contact information:   [place] [address] [phone]        The results of significant diagnostics from this hospitalization (including imaging, microbiology, ancillary and laboratory) are listed below for reference.    Significant Diagnostic Studies: Dg Chest 2 View  03/03/2016  CLINICAL DATA:  Hypoxia, active cancer, low oxygen level. EXAM: CHEST  2 VIEW COMPARISON:  Head CT dated 01/19/2016. FINDINGS: Lungs are hyperexpanded indicating COPD/emphysema. Prominent interstitial  markings noted throughout both lungs. No confluent airspace opacity to suggest a developing pneumonia. No pneumothorax seen. There is mild cardiomegaly. Left chest wall Port-A-Cath in place with tip well-positioned at the level of the mid SVC. No acute or suspicious osseous lesions seen. Mild degenerative spurring noted within the thoracic spine. IMPRESSION: 1. Hyperexpanded lungs indicating COPD/emphysema. 2. Prominent interstitial markings bilaterally, new compared to the recent PET-CT, suggesting acute interstitial edema related to CHF/volume overload.  3. Mild cardiomegaly. Electronically Signed   By: Franki Cabot M.D.   On: 03/03/2016 15:21   US Renal  03/08/2016  CLINICAL DATA:  Elevated creatinine. EXAM: RENAL / URINARY TRACT ULTRASOUND COMPLETE COMPARISON:  PET-CT - 01/19/2016 ; CT the chest, abdomen pelvis - 01/03/2016 FINDINGS: Right Kidney: Normal cortical thickness, echogenicity and size, measuring 10.9 cm in length. No focal renal lesions. No echogenic renal stones. No urinary obstruction. Left Kidney: Normal cortical thickness, echogenicity and size, measuring 11.2 cm in length. No focal renal lesions. No echogenic renal stones. No urinary obstruction. Bladder: Appears normal for degree of bladder distention. IMPRESSION: No explanation for patient's elevated creatinine. Specifically, no evidence of urinary obstruction. Electronically Signed   By: Sandi Mariscal M.D.   On: 03/08/2016 20:41   Nm Pulmonary Perf And Vent  03/03/2016  CLINICAL DATA:  Shortness of breath for several days, current history of malignancy EXAM: NUCLEAR MEDICINE VENTILATION - PERFUSION LUNG SCAN TECHNIQUE: Ventilation images were obtained in multiple projections using inhaled aerosol Tc-30m DTPA. Perfusion images were obtained in multiple projections after intravenous injection of Tc-29m MAA. RADIOPHARMACEUTICALS:  30 mCi Technetium-71m DTPA aerosol inhalation and 4.4 mCi Technetium-68m MAA IV COMPARISON:  Chest radiograph  03/03/16 FINDINGS: Ventilation:  Diffuse bilateral patchy ventilation defects Perfusion: No wedge shaped peripheral perfusion defects to suggest acute pulmonary embolism. IMPRESSION: Patchy ventilation abnormality bilaterally without perfusion defect. Study does not suggest the presence of pulmonary embolism. Electronically Signed   By: Skipper Cliche M.D.   On: 03/03/2016 16:37   Dg Abd Portable 1v  03/05/2016  CLINICAL DATA:  Ileus. EXAM: PORTABLE ABDOMEN - 1 VIEW COMPARISON:  01/17/2016 FINDINGS: The gastrostomy tube is identified within the projection of the left upper quadrant of the abdomen. The bowel gas pattern appears within normal limits. No dilated loops of small bowel or air-fluid levels identified. Right pleural effusion with overlying airspace consolidation noted. IMPRESSION: 1. Normal bowel gas pattern noted. Electronically Signed   By: Kerby Moors M.D.   On: 03/05/2016 15:51    Microbiology: Recent Results (from the past 240 hour(s))  MRSA PCR Screening     Status: None   Collection Time: 03/03/16  9:49 PM  Result Value Ref Range Status   MRSA by PCR NEGATIVE NEGATIVE Final    Comment:        The GeneXpert MRSA Assay (FDA approved for NASAL specimens only), is one component of a comprehensive MRSA colonization surveillance program. It is not intended to diagnose MRSA infection nor to guide or monitor treatment for MRSA infections.   Urine culture     Status: Abnormal   Collection Time: 03/08/16  3:04 PM  Result Value Ref Range Status   Specimen Description URINE, RANDOM  Final   Special Requests NONE  Final   Culture MULTIPLE SPECIES PRESENT, SUGGEST RECOLLECTION (A)  Final   Report Status 03/10/2016 FINAL  Final     Labs: Basic Metabolic Panel:  Recent Labs Lab 03/06/16 0355 03/07/16 0526 03/08/16 0530 03/08/16 0700 03/09/16 0450 03/10/16 0626  NA 135 134* 135  --  135 138  K 3.7 3.9 4.4  --  4.1 4.0  CL 96* 95* 93*  --  93* 93*  CO2 28 28 29   --  29  31  GLUCOSE 116* 124* 113*  --  111* 106*  BUN 39* 46* 54*  --  57* 53*  CREATININE 2.10* 2.03* 2.21*  --  2.04* 1.92*  CALCIUM 8.6* 9.0 8.9  --  9.0  9.1  MG 2.2  --   --  2.4  --   --   PHOS  --   --   --   --  4.2 4.6   Liver Function Tests:  Recent Labs Lab 03/03/16 1639 03/06/16 0355 03/09/16 0450 03/10/16 0626  AST 89* 22  --   --   ALT 27 16  --   --   ALKPHOS 96 78  --   --   BILITOT 0.5 0.7  --   --   PROT 6.7 5.7*  --   --   ALBUMIN 3.3* 2.4* 2.4* 2.3*   No results for input(s): LIPASE, AMYLASE in the last 168 hours. No results for input(s): AMMONIA in the last 168 hours. CBC:  Recent Labs Lab 03/03/16 1639  03/05/16 0500 03/06/16 0355 03/07/16 0526 03/08/16 0530 03/09/16 0450  WBC 4.6  < > 5.2 5.2 6.3 7.8 8.3  NEUTROABS 3.6  --   --   --   --   --   --   HGB 8.6*  < > 7.5* 8.4* 8.8* 8.5* 8.4*  HCT 25.4*  < > 22.3* 24.4* 25.8* 25.8* 25.3*  MCV 88.8  < > 90.7 88.7 89.0 90.8 92.0  PLT 100*  < > 97* 90* 107* 126* 167  < > = values in this interval not displayed. Cardiac Enzymes:  Recent Labs Lab 03/03/16 1639 03/04/16 0020 03/04/16 0543 03/04/16 1050  TROPONINI 14.22* 21.49* 23.06* 15.86*   BNP: BNP (last 3 results)  Recent Labs  03/03/16 1639 03/08/16 0530  BNP 2533.0* >4500.0*    ProBNP (last 3 results) No results for input(s): PROBNP in the last 8760 hours.  CBG:  Recent Labs Lab 03/05/16 0855 03/05/16 1151 03/05/16 1301 03/05/16 1310 03/05/16 1658  GLUCAP 111* 59* 105* 124* 91       Signed:  Chaunta Bejarano MD.  [facility] 03/10/2016, 3:32 PM

## 2016-03-10 NOTE — Progress Notes (Signed)
PT Cancellation Note  Patient Details Name: Gina Frederick MRN: [id number] DOB: 09-Feb-1956   Cancelled Treatment:    Reason Eval/Treat Not Completed: Other (comment) (Getting bandage changed then eating a meal).  Try back as time and pt allow.   Ramond Dial 03/10/2016, 12:02 PM   Mee Hives, PT MS Acute Rehab Dept. Number: [facility] [id number] and [place] [phone]

## 2016-03-10 NOTE — Progress Notes (Signed)
Subjective:  Doing well this morning. No acute complaints.   Objective Vital signs in last 24 hours: Filed Vitals:   03/09/16 1800 03/09/16 1956 03/09/16 2025 03/10/16 0449  BP:   128/96 130/96  Pulse:   99 85  Temp:   98.7 F (37.1 C) 98.1 F (36.7 C)  TempSrc:   Oral Oral  Resp:   18 18  Height:      Weight:    142 lb 3.2 oz (64.5 kg)  SpO2: 95% 92% 100% 100%   Weight change: -1 lb 4.9 oz (-0.591 kg)  Intake/Output Summary (Last 24 hours) at 03/10/16 0811 Last data filed at 03/09/16 2028  Gross per 24 hour  Intake      0 ml  Output    100 ml  Net   -100 ml    Physical Exam: General: 60 year old female in NAD, Ashley in place Heart: RRR, no murmurs Lungs:CTA Abdomen:PEG tube in place, S, NT, Nd Extremities: No edema  Labs: Basic Metabolic Panel:  Recent Labs Lab 03/08/16 0530 03/09/16 0450 03/10/16 0626  NA 135 135 138  K 4.4 4.1 4.0  CL 93* 93* 93*  CO2 29 29 31   GLUCOSE 113* 111* 106*  BUN 54* 57* 53*  CREATININE 2.21* 2.04* 1.92*  CALCIUM 8.9 9.0 9.1  PHOS  --  4.2 4.6   Liver Function Tests:  Recent Labs Lab 03/03/16 1639 03/06/16 0355 03/09/16 0450 03/10/16 0626  AST 89* 22  --   --   ALT 27 16  --   --   ALKPHOS 96 78  --   --   BILITOT 0.5 0.7  --   --   PROT 6.7 5.7*  --   --   ALBUMIN 3.3* 2.4* 2.4* 2.3*   No results for input(s): LIPASE, AMYLASE in the last 168 hours. No results for input(s): AMMONIA in the last 168 hours. CBC:  Recent Labs Lab 03/03/16 1639  03/05/16 0500 03/06/16 0355 03/07/16 0526 03/08/16 0530 03/09/16 0450  WBC 4.6  < > 5.2 5.2 6.3 7.8 8.3  NEUTROABS 3.6  --   --   --   --   --   --   HGB 8.6*  < > 7.5* 8.4* 8.8* 8.5* 8.4*  HCT 25.4*  < > 22.3* 24.4* 25.8* 25.8* 25.3*  MCV 88.8  < > 90.7 88.7 89.0 90.8 92.0  PLT 100*  < > 97* 90* 107* 126* 167  < > = values in this interval not displayed. Cardiac Enzymes:  Recent Labs Lab 03/03/16 1639 03/04/16 0020 03/04/16 0543 03/04/16 1050  TROPONINI 14.22*  21.49* 23.06* 15.86*   CBG:  Recent Labs Lab 03/05/16 0855 03/05/16 1151 03/05/16 1301 03/05/16 1310 03/05/16 1658  GLUCAP 111* 59* 105* 124* 91    Iron Studies: No results for input(s): IRON, TIBC, TRANSFERRIN, FERRITIN in the last 72 hours. Studies/Results: US Renal  03/08/2016  CLINICAL DATA:  Elevated creatinine. EXAM: RENAL / URINARY TRACT ULTRASOUND COMPLETE COMPARISON:  PET-CT - 01/19/2016 ; CT the chest, abdomen pelvis - 01/03/2016 FINDINGS: Right Kidney: Normal cortical thickness, echogenicity and size, measuring 10.9 cm in length. No focal renal lesions. No echogenic renal stones. No urinary obstruction. Left Kidney: Normal cortical thickness, echogenicity and size, measuring 11.2 cm in length. No focal renal lesions. No echogenic renal stones. No urinary obstruction. Bladder: Appears normal for degree of bladder distention. IMPRESSION: No explanation for patient's elevated creatinine. Specifically, no evidence of urinary obstruction. Electronically Signed  By: Sandi Mariscal M.D.   On: 03/08/2016 20:41   Medications: Infusions:    Scheduled Medications: . antiseptic oral rinse  7 mL Mouth Rinse q12n4p  . apixaban  5 mg Oral BID  . atorvastatin  40 mg Oral q1800  . benzonatate  100 mg Oral TID  . budesonide (PULMICORT) nebulizer solution  0.25 mg Nebulization BID  . chlorhexidine  15 mL Mouth Rinse BID  . feeding supplement (ENSURE ENLIVE)  237 mL Oral BID BM  . feeding supplement (OSMOLITE 1.5 CAL)  237 mL Per Tube TID WC & HS  . fentaNYL  25 mcg Transdermal Q72H  . ipratropium-albuterol  3 mL Nebulization TID  . metoprolol tartrate  100 mg Oral BID  . polyethylene glycol  17 g Oral Daily  . sodium chloride flush  10-40 mL Intracatheter Q12H   I have reviewed scheduled and prn medications.   Assessment/ Plan: Pt is a 60 y.o. yo female who was admitted on 03/03/2016 with NSTEMI and AKI. PMH of HTN, HLD, and SCC of the neck s/p radiation and cisplatin chemotherapy  1.  AKI. Cr 2.22 on admission. Baseline appears to be <1. Likely hemodynamically mediated AKI in the setting of hypovolemia 2/2 side effects of XRT and chemotherapy in addition to hypoperfusion 2/2 NSTEMI while on an ACE inhibitor. Cisplatin-related nephrotoxicity may also be contributing.  - Renal function improving.OK for discharge from renal standpoint. Will need follow up with oncologist and PCP to monitor improvement in renal function.  2. Hypertension/volume. Holding home antihypertensives. Appears euvolemic.  -Monitor UOP 3. Pancytopenia. WBC 2.4, Hgb 8.6, PLT 94 on admission. Previously normal last month. Likely side effect of starting cisplatin infusions last month.  - Monitor CBC 4. CHF with borderline EF. EF40-45%. Monitor fluid status.  5. SCC of the neck. S/p XRT and cisplatin 6. PAF. Chads2vasc 4. Started on eliquis 5mg  bid. Monitor for signs of bleeding.   Algis Greenhouse. Jerline Pain, [facility] Resident PGY-2 03/10/2016 8:11 AM   LOS: 7 days    Renal Attending; AKI, hemodynamically mediated and questionable direct influence of chemotherapy.  Rehydration and avoidance of nephrotoxins is appropriate. We will sign off.  OK for DC. Brytnee Bechler C

## 2016-03-10 NOTE — Progress Notes (Signed)
Subjective:  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   Patient was seen and examined this morning. Her main complaint is of cough. She denies chest pain, shortness of breath, DOE, orthopnea, lower extremity swelling or palpitations. She wants to go home.   Objective:  Filed Vitals:   03/09/16 1800 03/09/16 1956 03/09/16 2025 03/10/16 0449  BP:   128/96 130/96  Pulse:   99 85  Temp:   98.7 F (37.1 C) 98.1 F (36.7 C)  TempSrc:   Oral Oral  Resp:   18 18  Height:      Weight:    142 lb 3.2 oz (64.5 kg)  SpO2: 95% 92% 100% 100%   Intake/Output from previous day:  Intake/Output Summary (Last 24 hours) at 03/10/16 0825 Last data filed at 03/09/16 2028  Gross per 24 hour  Intake      0 ml  Output    100 ml  Net   -100 ml    Physical Exam: General: Vital signs reviewed.  Patient is thin, appears older than stated age, in no acute distress and cooperative with exam.  Neck: Large bandage wrapped around neck.  Cardiovascular: RRR, S1 normal, S2 normal, no murmurs, gallops, or rubs. Pulmonary/Chest: +Rhonchi, no rales or wheezes. Abdominal: Soft, non-tender, non-distended  Extremities: No lower extremity edema bilaterally Psychiatric: Normal mood and affect.   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  NSTEMI: No recurrent chest pain. Presented with elevated troponins 14>21>23>15 without ST changes. Patient has been managed medically, now off of ASA and heparin and on metoprolol.  -Follow up with Dr. Percival Spanish after discharge -Avoiding ACEI with ARF -Continue metoprolol 100 mg BID -Continue atorvastatin 40 mg daily  -No ASA, now on Eliquis for PAF  PAF: Seen on telemetry, CHADS-VASC 4. However, there is concern for bleeding given patient's history of thrombocytopenia while on chemotherapy. Recently as low at 90s, but currently 167. Currently on metoprolol 100 mg BID with rate control and Eliquis 5 mg BID. -Continue metoprolol 100 mg BID -Continue Eliquis 5 mg BID, monitor for thrombocytopenia -Monitor rate, consider amiodarone in the future if needed  Acute Sytolic CHF: Appears euvolemic. EF 40-45% on 03/04/16 with severe hypokinesis of the inferior myocardium. Pa peak pressure of 44 mmHg.  -Metoprolol 100 mg BID -Holding HCTZ and ACEI given GFR -Monitor volume status, no need or lasix now  Acute Renal Failure: Baseline 1  prior to admission. Cr 2.22 on admission, 1.92 this morning. Not much improvement as GFR 26 to 27. Per nephrology, likely secondary to hypovolemia from chemotherapy and radiation. Also consideration for nephrotoxicity from Cisplatin.  -Per nephrology -Giving gentle IVF- monitor volume status while hydrating  Normocytic Anemia: Trending down since March. 12.2 to 8.4 in the last month, follows with Oncology for tranfusions.  -Per primary  Advanced Squamous Cell Carcinoma of the Neck: Undergoing chemoradiation. -Per primary  Martyn Malay, DO PGY-2 Internal Medicine Resident Pager # [phone] 03/10/2016 8:25 AM  As above, patient seen and examined. Patient denies chest pain or dyspnea. She remains in sinus rhythm. We are treating  her recent infarct medically. Continue statin. I have not added aspirin given need for anticoagulation.Continue beta blocker and apixaban. Her platelet count will need to be followed closely if she has continuing chemotherapy. If she develops significant thrombocytopenia she may need to have her anticoagulation held transiently. Patient should follow-up with Dr. Percival Spanish in [place] in 4 weeks. We will sign off. Please call with questions. Kirk Ruths

## 2016-03-10 NOTE — Progress Notes (Signed)
Trying to call IV team about patient being deassessed

## 2016-03-13 ENCOUNTER — Inpatient Hospital Stay (HOSPITAL_COMMUNITY): Payer: Self-pay

## 2016-03-13 ENCOUNTER — Encounter (HOSPITAL_COMMUNITY): Payer: Self-pay | Admitting: Hematology & Oncology

## 2016-03-13 ENCOUNTER — Encounter (HOSPITAL_COMMUNITY): Payer: Medicaid Other | Attending: Hematology & Oncology | Admitting: Hematology & Oncology

## 2016-03-13 VITALS — BP 125/79 | HR 78 | Temp 98.5°F

## 2016-03-13 DIAGNOSIS — D649 Anemia, unspecified: Secondary | ICD-10-CM | POA: Insufficient documentation

## 2016-03-13 DIAGNOSIS — C760 Malignant neoplasm of head, face and neck: Secondary | ICD-10-CM | POA: Insufficient documentation

## 2016-03-13 DIAGNOSIS — I10 Essential (primary) hypertension: Secondary | ICD-10-CM

## 2016-03-13 DIAGNOSIS — J432 Centrilobular emphysema: Secondary | ICD-10-CM | POA: Diagnosis not present

## 2016-03-13 DIAGNOSIS — I82C11 Acute embolism and thrombosis of right internal jugular vein: Secondary | ICD-10-CM

## 2016-03-13 DIAGNOSIS — C4442 Squamous cell carcinoma of skin of scalp and neck: Secondary | ICD-10-CM

## 2016-03-13 DIAGNOSIS — Z95828 Presence of other vascular implants and grafts: Secondary | ICD-10-CM

## 2016-03-13 DIAGNOSIS — E871 Hypo-osmolality and hyponatremia: Secondary | ICD-10-CM | POA: Diagnosis not present

## 2016-03-13 DIAGNOSIS — Z720 Tobacco use: Secondary | ICD-10-CM

## 2016-03-13 DIAGNOSIS — I252 Old myocardial infarction: Secondary | ICD-10-CM | POA: Diagnosis not present

## 2016-03-13 DIAGNOSIS — N189 Chronic kidney disease, unspecified: Secondary | ICD-10-CM

## 2016-03-13 DIAGNOSIS — D631 Anemia in chronic kidney disease: Secondary | ICD-10-CM

## 2016-03-13 DIAGNOSIS — I214 Non-ST elevation (NSTEMI) myocardial infarction: Secondary | ICD-10-CM

## 2016-03-13 NOTE — Progress Notes (Signed)
[place] [facility] Progress Note  Patient Care Team: Noel Journey. 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 Benjamine Mola 12/21/2015 No obvious primary lesion noted on panendoscopy examination CT neck 12/17/2015 large infiltrative tumor mass extends to skin surface, infiltrates R patorid, R submandibular, R carotid sheath,Thrombosis of R IJ vein, multiple satellite nodules Centrilobular emphysema P16 negative Tobacco Abuse Port a cath/FT placement on 01/17/2016 Multiple dental extractions with Dr. Enrique Sack 01/13/3016    Squamous cell carcinoma of head and neck ([place])   12/17/2015 Imaging CT neck- large infiltrative tumor mass extends to skin surface, infiltrates R patorid, R submandibular, R carotid sheath,Thrombosis of R IJ vein, multiple satellite nodules   12/21/2015 Procedure Soft tissue, biopsy, right neck mass by Dr. Benjamine Mola  Direct laryngoscopy, rigid esophagoscopy, and bronchoscopy    12/21/2015 Pathology Results - SQUAMOUS CELL CARCINOMA.  The carcinoma appears moderately to poorly differentiated.  P16 NEGATIVE.   12/28/2015 Initial Diagnosis Squamous cell carcinoma of head and neck ([place])   01/03/2016 Imaging CT CAP- Large right-sided neck mass. No evidence for lymphadenopathy or metastatic disease in the chest, abdomen, or pelvis. 9 mm ground-glass nodule in the posterior right lung apex is probably related to scarring given the underlying emphysema   01/04/2016 Imaging Bone scan- No evidence of metastatic disease.   01/17/2016 Procedure IR port and feeding tube placed   01/19/2016 PET scan The dominant right neck mass is hypermetabolic. In addition, there are hypermetabolic smaller cervical lymph nodes  bilaterally.  No discrete lesion of the pharyngeal mucosal space identified. The dominant right neck mass and its hypermetabolic activi...   01/31/2016 -  Chemotherapy Cisplatin   02/01/2016 -  Radiation Therapy 70 Gy by Dr. Isidore Moos.   03/03/2016 - 03/10/2016 Hospital Admission NSTEMI, ARF, CHF, Afib with RVR, respiratory failure with hypoxia    HISTORY OF PRESENTING ILLNESS:  Gina Frederick 60 y.o. 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   [name] is accompanied by a female friend, she has just been discharged from [facility] after an MI.   She has not found the number to call and reschedule radiation treatment.  She has stopped smoking.  States, "I'm barely moving now". Admits she was "pouty" while in the hospital. Her breathing is doing well, despite sounding "raspy". She was on oxygen while in the hospital. Denies shortness of breath while walking or sitting.   She has been eating the "canned food". She also eats apple sauce and soups. She is able to sleep at night, "now that I am back in my bed".   She is able to walk "like a mouse", without stumbling or falling. While walking, her feet feel as though they are shaking on top.She notes fluid retention "on the tops of her feet" and ankles from her hospital stay.  MEDICAL HISTORY:  Past Medical History  Diagnosis Date  . Essential hypertension   . Arthritis   . Hyperlipidemia   . Squamous cell carcinoma (HCC)     Invasive - right neck, XRT and Cisplatin - Dr. Whitney Muse  . Edentulous     Multiple dental extractions March 2017  . Centrilobular emphysema ([place])   . Myocardial infarction ([place]) 03/03/16    SURGICAL  HISTORY: Past Surgical History  Procedure Laterality Date  . Tonsillectomy    . Knee arthroscopy Right   . Cesarean section    . Mass biopsy Right 12/21/2015    Procedure: NECK MASS BIOPSY;  Surgeon: Leta Baptist, MD;  Location: [place];  Service: ENT;   Laterality: Right;  . Panendoscopy N/A 12/21/2015    Procedure: PANENDOSCOPY;  Surgeon: Leta Baptist, MD;  Location: [place];  Service: ENT;  Laterality: N/A;  . Multiple extractions with alveoloplasty N/A 01/13/2016    Procedure: EXTRACTION OF TOOTH #'S 6-12,15, 19-28, 30 WITH ALVEOLOPLASTY AND BILATERAL MAXILLARY TUBEROSITY REDUCTIONS;  Surgeon: Lenn Cal, DDS;  Location: [facility];  Service: Oral Surgery;  Laterality: N/A;    SOCIAL HISTORY: Social History   Social History  . Marital Status: Divorced    Spouse Name: N/A  . Number of Children: 1  . Years of Education: N/A   Occupational History  . Not on file.   Social History Main Topics  . Smoking status: Former Smoker -- 0.25 packs/day for 20 years    Types: Cigarettes  . Smokeless tobacco: Never Used     Comment: She took a puff two days ago  . Alcohol Use: No  . Drug Use: No  . Sexual Activity: No   Other Topics Concern  . Not on file   Social History Narrative   Lives with a lady she takes care of.    She is divorced. One daughter, no grandchildren Smokes, notes that she is down to 3 a day. Began smoking "probably in her 40's." She says she has quit "on and off through the years." Denies problems with EtOH. 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 Quit working when her daughter was "of age to go to school," then stayed home with her daughter. For hobbies, she says she likes to read.  Is currently the caregiver for a lady with diabetes.  She does light housekeeping and keeps her on her medications, and cooks. The son of the woman she cares for, Herbie Baltimore, notes that her good cooking has brought his momma's blood pressure and blood sugar down.  She notes she has a good support system with her sister, and with the son of the woman she takes care of.  FAMILY HISTORY: Family History  Problem Relation Age of Onset  .  Alzheimer's disease Mother   . Congestive Heart Failure Father 66   indicated that her mother is deceased. She indicated that her father is deceased.   Mother and father are deceased. Mother was 50; died of Alzheimer's. Father was 70; congestive heart failure. Has a sister, age 55 this year. Had a brother, deceased. He took his own life. 4 nieces and nephews.  ALLERGIES:  is allergic to codeine.  MEDICATIONS:  Current Outpatient Prescriptions  Medication Sig Dispense Refill  . acetaminophen (TYLENOL) 500 MG tablet Take 1,000 mg by mouth every 6 (six) hours as needed for mild pain or moderate pain.     Marland Kitchen apixaban (ELIQUIS) 5 MG TABS tablet Take 1 tablet (5 mg total) by mouth 2 (two) times daily. 60 tablet 3  . atorvastatin (LIPITOR) 40 MG tablet Take 1 tablet (40 mg total) by mouth daily at 6 PM. 30 tablet 3  . benzonatate (TESSALON) 100 MG capsule Take 1 capsule (100 mg total) by mouth 3 (three) times daily as needed for cough. 20 capsule  0  . CISPLATIN IV Inject into the vein. Reported on 01/21/2016    . feeding supplement, ENSURE ENLIVE, (ENSURE ENLIVE) LIQD Take 237 mLs by mouth 2 (two) times daily between meals. 237 mL 0  . fentaNYL (DURAGESIC) 25 MCG/HR patch Place 1 patch (25 mcg total) onto the skin every 3 (three) days. 10 patch 0  . lidocaine-prilocaine (EMLA) cream Apply a quarter size amount to port site 1 hour prior to chemo. Do not rub in. Cover with plastic wrap. 30 g 3  . LORazepam (ATIVAN) 0.5 MG tablet Take 1 tablet every 4-6 hours as needed for nausea/vomiting. 45 tablet 0  . magnesium oxide (MAG-OX) 400 (241.3 Mg) MG tablet Take 1 tablet (400 mg total) by mouth 2 (two) times daily. 60 tablet 3  . metoprolol (LOPRESSOR) 100 MG tablet Take 1 tablet (100 mg total) by mouth 2 (two) times daily. 60 tablet 3  . ondansetron (ZOFRAN) 8 MG tablet TAKE 1 TABLET EVERY 8 HOURS AS NEEDED FOR NAUSEA AND VOMITING 30 tablet 6  . Tetrahydrozoline-Zn Sulfate (EYE DROPS ALLERGY RELIEF OP)  Apply 1 drop to eye 2 (two) times daily as needed (Itching eyes).    Marland Kitchen zolpidem (AMBIEN) 10 MG tablet Take 1 tablet (10 mg total) by mouth at bedtime as needed for sleep. 30 tablet 0  . budesonide-formoterol (SYMBICORT) 160-4.5 MCG/ACT inhaler Inhale 2 puffs into the lungs 2 (two) times daily. (Patient not taking: Reported on 03/13/2016) 1 Inhaler 3  . diphenhydrAMINE (BENADRYL) 25 MG tablet Take 25 mg by mouth every 6 (six) hours as needed. Reported on 03/13/2016    . fluconazole (DIFLUCAN) 100 MG tablet Take 100 mg by mouth daily. Reported on 03/13/2016    . nitroGLYCERIN (NITROSTAT) 0.4 MG SL tablet Place 1 tablet (0.4 mg total) under the tongue every 5 (five) minutes x 3 doses as needed for chest pain. (Patient not taking: Reported on 03/13/2016) 15 tablet 0  . oxyCODONE-acetaminophen (PERCOCET) 10-325 MG tablet Take every 2-4 hours as needed for pain (Patient not taking: Reported on 03/13/2016) 60 tablet 0  . polyethylene glycol (MIRALAX / GLYCOLAX) packet Take 17 g by mouth daily. (Patient not taking: Reported on 03/13/2016) 14 each 0  . prochlorperazine (COMPAZINE) 10 MG tablet Take 1 tablet (10 mg total) by mouth every 6 (six) hours as needed for nausea or vomiting. (Patient not taking: Reported on 03/13/2016) 30 tablet 2  . tiotropium (SPIRIVA HANDIHALER) 18 MCG inhalation capsule Place 1 capsule (18 mcg total) into inhaler and inhale daily. (Patient not taking: Reported on 03/13/2016) 30 capsule 3   No current facility-administered medications for this visit.   Facility-Administered Medications Ordered in Other Visits  Medication Dose Route Frequency Provider Last Rate Last Dose  . sodium polystyrene (KAYEXALATE) 15 GM/60ML suspension 30 g  30 g Oral Once Baird Cancer, PA-C        Review of Systems  Constitutional: Positive for malaise/fatigue. Negative for fever and chills.  HENT: Negative for congestion, hearing loss, nosebleeds, sore throat and tinnitus.        Hoarse voice.  Eyes: Negative.   Negative for blurred vision, double vision, pain and discharge.  Respiratory: Negative for hemoptysis, shortness of breath and wheezing.   Cardiovascular: Positive for leg swelling. Negative for chest pain, palpitations, claudication and PND.       Feet/ankle swelling.  Gastrointestinal: Negative for heartburn, vomiting, abdominal pain, diarrhea, constipation, blood in stool and melena.  Genitourinary: Negative.  Negative for dysuria, urgency, frequency  and hematuria.  Musculoskeletal: Negative for myalgias, joint pain and falls.  Skin: Negative.  Negative for itching and rash.  Neurological: Positive for weakness. Negative for dizziness, tingling, tremors, sensory change, speech change, focal weakness, seizures, loss of consciousness and headaches.  Endo/Heme/Allergies: Negative.  Does not bruise/bleed easily.  Psychiatric/Behavioral: Negative for depression, suicidal ideas, memory loss and substance abuse. The patient is not nervous/anxious.   All other systems reviewed and are negative.  14 point ROS was done and is otherwise as detailed above or in HPI   PHYSICAL EXAMINATION: ECOG PERFORMANCE STATUS: 1 - Symptomatic but completely ambulatory  Vitals with BMI 03/13/2016  Height   Weight   BMI   Systolic 0000000  Diastolic 79  Pulse 78  Respirations    Physical Exam  Constitutional: She is oriented to person, place, and time and well-developed, well-nourished, and in no distress.  Wears glasses.   HENT:  Head: Normocephalic and atraumatic.  Nose: Nose normal.  Mouth/Throat: No oropharyngeal exudate.  Limited as to how far she can open her mouth, due to her mass. Mild oropharyngeal mucositis noted  Eyes: Conjunctivae and EOM are normal. Pupils are equal, round, and reactive to light. Right eye exhibits no discharge. Left eye exhibits no discharge. No scleral icterus.  Neck: Normal range of motion. Neck supple. No tracheal deviation present. No thyromegaly present.  Neck mass still  noted, smaller, significant XRT changes L neck greater than R  Cardiovascular: Normal rate, regular rhythm and normal heart sounds.  Exam reveals no gallop and no friction rub.   No murmur heard. Pulmonary/Chest: Effort normal. She has no wheezes. She has no rales.  Occasional coarse BS  Abdominal: Soft. Bowel sounds are normal. She exhibits no distension and no mass. There is no tenderness. There is no rebound and no guarding.  FT C/D/I  Musculoskeletal: Normal range of motion. She exhibits no edema.  Bilateral lower extremity edema.  Lymphadenopathy:    She has no cervical adenopathy.  Neurological: She is alert and oriented to person, place, and time. She has normal reflexes. No cranial nerve deficit. Gait normal. Coordination normal.  Skin: Skin is warm and dry. No rash noted.  Psychiatric: Mood, memory, affect and judgment normal.  Nursing note and vitals reviewed.   LABORATORY DATA:  I have reviewed the data as listed  Lab Results  Component Value Date   WBC 8.3 03/09/2016   HGB 8.4* 03/09/2016   HCT 25.3* 03/09/2016   MCV 92.0 03/09/2016   PLT 167 03/09/2016   CMP     Component Value Date/Time   NA 138 03/10/2016 0626   K 4.0 03/10/2016 0626   CL 93* 03/10/2016 0626   CO2 31 03/10/2016 0626   GLUCOSE 106* 03/10/2016 0626   BUN 53* 03/10/2016 0626   CREATININE 1.92* 03/10/2016 0626   CALCIUM 9.1 03/10/2016 0626   PROT 5.7* 03/06/2016 0355   ALBUMIN 2.3* 03/10/2016 0626   AST 22 03/06/2016 0355   ALT 16 03/06/2016 0355   ALKPHOS 78 03/06/2016 0355   BILITOT 0.7 03/06/2016 0355   GFRNONAA 27* 03/10/2016 0626   GFRAA 32* 03/10/2016 0626    RADIOGRAPHIC STUDIES: I have personally reviewed the radiological images as listed and agreed with the findings in the report. Study Result     CLINICAL DATA: Elevated creatinine.  EXAM: RENAL / URINARY TRACT ULTRASOUND COMPLETE  COMPARISON: PET-CT - 01/19/2016 ; CT the chest, abdomen pelvis  - 01/03/2016  FINDINGS: Right Kidney:  Normal cortical thickness,  echogenicity and size, measuring 10.9 cm in length. No focal renal lesions. No echogenic renal stones. No urinary obstruction.  Left Kidney:  Normal cortical thickness, echogenicity and size, measuring 11.2 cm in length. No focal renal lesions. No echogenic renal stones. No urinary obstruction.  Bladder:  Appears normal for degree of bladder distention.  IMPRESSION: No explanation for patient's elevated creatinine. Specifically, no evidence of urinary obstruction.   Electronically Signed  By: Sandi Mariscal M.D.  On: 03/08/2016 20:41    CLINICAL DATA: Initial treatment strategy for squamous cell carcinoma of the right neck.  EXAM: NUCLEAR MEDICINE PET SKULL BASE TO THIGH  TECHNIQUE: 7.61 mCi F-18 FDG was injected intravenously. Full-ring PET imaging was performed from the skull base to thigh after the radiotracer. CT data was obtained and used for attenuation correction and anatomic localization.  FASTING BLOOD GLUCOSE: Value: 98 mg/dl  COMPARISON: CTs of the neck, 12/17/2015, and chest, abdomen and pelvis, 01/02/2015. Bone scan 01/03/2015.  FINDINGS: NECK  The dominant right neck mass is markedly hypermetabolic with an SUV max of 20.7. This mass measures approximately 7.0 x 6.8 cm on image 36. There are multiple additional hypermetabolic cervical lymph nodes bilaterally. These include a right level 4 node measuring 14 mm on image 43 (SUV max 9.9), and small left-sided level 2 (10 mm on image 28) and level 4 nodes on image 41 (measuring 7 and 10 mm, SUV max 11.1). The dominant right neck mass appears contiguous with the right lobe of the thyroid and maybe invading it.No lesions of the pharyngeal mucosal space are identified. There is stable mass effect on the larynx which is displaced to the left.  CHEST  There are no hypermetabolic mediastinal, hilar or axillary  lymph nodes. There is no suspicious pulmonary activity. Emphysema is noted. The small ground-glass nodule previously noted in the right upper lobe measures 6 mm on image number 6, too small to evaluate by PET-CT.  ABDOMEN/PELVIS  There is no hypermetabolic activity within the liver, adrenal glands, spleen or pancreas. There is no hypermetabolic nodal activity. There is physiologic activity within the left anterior abdominal wall surrounding a percutaneous G-tube. There is a small amount of extraluminal air within the left upper quadrant of the abdomen and in the anterior abdominal wall, attributed to this G-tube which was placed 2 days ago. Cholelithiasis, mild aortoiliac atherosclerosis and sigmoid diverticulosis are noted.  SKELETON  There is no hypermetabolic activity to suggest osseous metastatic disease.  IMPRESSION: 1. The dominant right neck mass is hypermetabolic. In addition, there are hypermetabolic smaller cervical lymph nodes bilaterally. 2. No discrete lesion of the pharyngeal mucosal space identified. The dominant right neck mass and its hypermetabolic activity are contiguous with the right thyroid lobe and may be invading it. 3. No suspicious activity within the chest, abdomen or pelvis. 4. Small ground-glass nodule in the right upper lobe is too small to evaluate by PET-CT. Attention on follow-up recommended. 5. Small amount of extraluminal air in the upper abdomen attributed to recent G-tube placement.   Electronically Signed  By: Richardean Sale M.D.  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 Benjamine Mola 12/21/2015 No obvious primary lesion noted on panendoscopy examination CT neck 12/17/2015 large infiltrative tumor mass extends to skin surface, infiltrates R patorid, R submandibular, R  carotid sheath,Thrombosis of R IJ vein, multiple satellite nodules Centrilobular emphysema P16 negative Tobacco Use  Tobacco Abuse Port a cath/FT placement on 01/17/2016 Multiple dental extractions with Dr. Enrique Sack 01/13/3016 Hyponatremia HTN NSTEMI  Given her kidney function/recent MI, I unfortunately do not feel able to give her Cisplatin.  We can consider additional chemotherapy pending how she tolerates the remainder of her radiation. We have called rad onc and they will work her in today.   I feel we should start her on renal dosing of aranesp given her anemia and heart disease and ongoing therapy.   She does not need refills at this time.  She will return in 2 weeks for follow up. She is to notify us with any problems or concerns in the interim.  All questions were answered. The patient knows to call the clinic with any problems, questions or concerns.  This document serves as a record of services personally performed by Ancil Linsey, MD. It was created on her behalf by Arlyce Harman, a trained medical scribe. The creation of this record is based on the scribe's personal observations and the provider's statements to them. This document has been checked and approved by the attending provider.  I have reviewed the above documentation for accuracy and completeness, and I agree with the above.  This note was electronically signed.  Molli Hazard, MD  03/13/2016 10:49 AM

## 2016-03-13 NOTE — Patient Instructions (Addendum)
[place] at [facility] Discharge Instructions  RECOMMENDATIONS MADE BY THE CONSULTANT AND ANY TEST RESULTS WILL BE SENT TO YOUR REFERRING PHYSICIAN.  Return to clinic in 2 weeks with labs You may go to Radiation today at 230 pm Thank you for choosing [place] at [facility] to provide your oncology and hematology care.  To afford each patient quality time with our provider, please arrive at least 15 minutes before your scheduled appointment time.   Beginning January 23rd 2017 lab work for the [name] will be done in the  Main lab at [facility] on 1st floor. If you have a lab appointment with the [facility] please come in thru the  Main Entrance and check in at the main information desk  You need to re-schedule your appointment should you arrive 10 or more minutes late.  We strive to give you quality time with our providers, and arriving late affects you and other patients whose appointments are after yours.  Also, if you no show three or more times for appointments you may be dismissed from the clinic at the providers discretion.     Again, thank you for choosing [facility].  Our hope is that these requests will decrease the amount of time that you wait before being seen by our physicians.       _____________________________________________________________  Should you have questions after your visit to [facility], please contact our office at [phone] between the hours of 8:30 a.m. and 4:30 p.m.  Voicemails left after 4:30 p.m. will not be returned until the following business day.  For prescription refill requests, have your pharmacy contact our office.         Resources For Cancer Patients and their Caregivers ? American Cancer Society: Can assist with transportation, wigs, general needs, runs Look Good Feel Better.        (615) 470-9444 ? Cancer Care: Provides financial assistance, online  support groups, medication/co-pay assistance.  1-800-813-HOPE 514 126 0731) ? [place] Assists [place] cancer patients and their families through emotional , educational and financial support.  6360957916 ? [place] DSS Where to apply for food stamps, Medicaid and utility assistance. 6626281183 ? [facility]: Transportation to medical appointments. 601-537-9134 ? Social Security Administration: May apply for disability if have a Stage IV cancer. 205-851-4657 646-692-2503 ? [employer], Disability and Transit Services: Assists with nutrition, care and transit needs. 9541681421

## 2016-03-14 ENCOUNTER — Ambulatory Visit (HOSPITAL_COMMUNITY): Payer: Self-pay

## 2016-03-15 ENCOUNTER — Ambulatory Visit (HOSPITAL_COMMUNITY): Payer: Self-pay

## 2016-03-16 ENCOUNTER — Ambulatory Visit (HOSPITAL_COMMUNITY): Payer: Self-pay

## 2016-03-17 ENCOUNTER — Ambulatory Visit (HOSPITAL_COMMUNITY): Payer: Self-pay

## 2016-03-20 ENCOUNTER — Ambulatory Visit (HOSPITAL_COMMUNITY): Payer: Self-pay

## 2016-03-21 ENCOUNTER — Ambulatory Visit (HOSPITAL_COMMUNITY): Payer: Self-pay

## 2016-03-22 ENCOUNTER — Ambulatory Visit (HOSPITAL_COMMUNITY): Payer: Self-pay

## 2016-03-23 ENCOUNTER — Ambulatory Visit (HOSPITAL_COMMUNITY): Payer: Self-pay

## 2016-03-24 ENCOUNTER — Ambulatory Visit (HOSPITAL_COMMUNITY): Payer: Self-pay

## 2016-03-25 NOTE — Progress Notes (Signed)
This encounter was created in error - please disregard.

## 2016-03-27 ENCOUNTER — Ambulatory Visit (HOSPITAL_COMMUNITY): Payer: Self-pay

## 2016-03-29 ENCOUNTER — Ambulatory Visit (HOSPITAL_COMMUNITY): Payer: Self-pay

## 2016-03-31 ENCOUNTER — Encounter (HOSPITAL_COMMUNITY): Payer: Self-pay | Admitting: Hematology & Oncology

## 2016-03-31 ENCOUNTER — Encounter (HOSPITAL_BASED_OUTPATIENT_CLINIC_OR_DEPARTMENT_OTHER): Payer: Medicaid Other | Admitting: Hematology & Oncology

## 2016-03-31 ENCOUNTER — Ambulatory Visit (HOSPITAL_COMMUNITY): Payer: Self-pay

## 2016-03-31 ENCOUNTER — Encounter (HOSPITAL_COMMUNITY): Payer: Medicaid Other

## 2016-03-31 VITALS — BP 136/79 | HR 92 | Temp 98.4°F | Resp 14 | Wt 139.1 lb

## 2016-03-31 DIAGNOSIS — I10 Essential (primary) hypertension: Secondary | ICD-10-CM

## 2016-03-31 DIAGNOSIS — C760 Malignant neoplasm of head, face and neck: Secondary | ICD-10-CM

## 2016-03-31 DIAGNOSIS — J432 Centrilobular emphysema: Secondary | ICD-10-CM | POA: Diagnosis not present

## 2016-03-31 DIAGNOSIS — D649 Anemia, unspecified: Secondary | ICD-10-CM

## 2016-03-31 DIAGNOSIS — Z8673 Personal history of transient ischemic attack (TIA), and cerebral infarction without residual deficits: Secondary | ICD-10-CM | POA: Diagnosis not present

## 2016-03-31 DIAGNOSIS — Z452 Encounter for adjustment and management of vascular access device: Secondary | ICD-10-CM

## 2016-03-31 DIAGNOSIS — Z95828 Presence of other vascular implants and grafts: Secondary | ICD-10-CM

## 2016-03-31 DIAGNOSIS — E871 Hypo-osmolality and hyponatremia: Secondary | ICD-10-CM | POA: Diagnosis not present

## 2016-03-31 DIAGNOSIS — N189 Chronic kidney disease, unspecified: Secondary | ICD-10-CM

## 2016-03-31 DIAGNOSIS — I214 Non-ST elevation (NSTEMI) myocardial infarction: Secondary | ICD-10-CM

## 2016-03-31 DIAGNOSIS — D631 Anemia in chronic kidney disease: Secondary | ICD-10-CM

## 2016-03-31 LAB — COMPREHENSIVE METABOLIC PANEL
ALK PHOS: 80 U/L (ref 38–126)
ALT: 17 U/L (ref 14–54)
AST: 26 U/L (ref 15–41)
Albumin: 3 g/dL — ABNORMAL LOW (ref 3.5–5.0)
Anion gap: 9 (ref 5–15)
BILIRUBIN TOTAL: 0.6 mg/dL (ref 0.3–1.2)
BUN: 20 mg/dL (ref 6–20)
CALCIUM: 8.4 mg/dL — AB (ref 8.9–10.3)
CO2: 33 mmol/L — ABNORMAL HIGH (ref 22–32)
CREATININE: 1.09 mg/dL — AB (ref 0.44–1.00)
Chloride: 85 mmol/L — ABNORMAL LOW (ref 101–111)
GFR, EST NON AFRICAN AMERICAN: 54 mL/min — AB (ref >60)
Glucose, Bld: 119 mg/dL — ABNORMAL HIGH (ref 65–99)
Potassium: 3.1 mmol/L — ABNORMAL LOW (ref 3.5–5.1)
Sodium: 127 mmol/L — ABNORMAL LOW (ref 135–145)
Total Protein: 6.1 g/dL — ABNORMAL LOW (ref 6.5–8.1)

## 2016-03-31 LAB — CBC WITH DIFFERENTIAL/PLATELET
Basophils Absolute: 0 10*3/uL (ref 0.0–0.1)
Basophils Relative: 0 %
EOS PCT: 1 %
Eosinophils Absolute: 0.1 10*3/uL (ref 0.0–0.7)
HEMATOCRIT: 26.7 % — AB (ref 36.0–46.0)
HEMOGLOBIN: 8.9 g/dL — AB (ref 12.0–15.0)
LYMPHS ABS: 0.5 10*3/uL — AB (ref 0.7–4.0)
LYMPHS PCT: 11 %
MCH: 31.3 pg (ref 26.0–34.0)
MCHC: 33.3 g/dL (ref 30.0–36.0)
MCV: 94 fL (ref 78.0–100.0)
Monocytes Absolute: 0.4 10*3/uL (ref 0.1–1.0)
Monocytes Relative: 8 %
NEUTROS PCT: 80 %
Neutro Abs: 3.9 10*3/uL (ref 1.7–7.7)
Platelets: 167 10*3/uL (ref 150–400)
RBC: 2.84 MIL/uL — AB (ref 3.87–5.11)
RDW: 19.2 % — ABNORMAL HIGH (ref 11.5–15.5)
WBC: 4.8 10*3/uL (ref 4.0–10.5)

## 2016-03-31 LAB — MAGNESIUM: MAGNESIUM: 2.6 mg/dL — AB (ref 1.7–2.4)

## 2016-03-31 MED ORDER — HEPARIN SOD (PORK) LOCK FLUSH 100 UNIT/ML IV SOLN
INTRAVENOUS | Status: AC
  Filled 2016-03-31: qty 5

## 2016-03-31 MED ORDER — ALTEPLASE 2 MG IJ SOLR
2.0000 mg | Freq: Once | INTRAMUSCULAR | Status: AC
  Administered 2016-03-31: 2 mg
  Filled 2016-03-31: qty 2

## 2016-03-31 MED ORDER — HEPARIN SOD (PORK) LOCK FLUSH 100 UNIT/ML IV SOLN
500.0000 [IU] | Freq: Once | INTRAVENOUS | Status: AC
  Administered 2016-03-31: 500 [IU] via INTRAVENOUS

## 2016-03-31 MED ORDER — POT BICARB-POT CHLORIDE 25 MEQ PO TBEF: 40.0000 meq | EFFERVESCENT_TABLET | Freq: Every day | ORAL | Status: DC

## 2016-03-31 MED ORDER — ZOLPIDEM TARTRATE 10 MG PO TABS: 10.0000 mg | ORAL_TABLET | Freq: Every evening | ORAL | Status: DC | PRN

## 2016-03-31 MED ORDER — STERILE WATER FOR INJECTION IJ SOLN
INTRAMUSCULAR | Status: AC
  Filled 2016-03-31: qty 10

## 2016-03-31 MED ORDER — SODIUM CHLORIDE 0.9% FLUSH
10.0000 mL | INTRAVENOUS | Status: AC
  Administered 2016-03-31: 10 mL via INTRAVENOUS

## 2016-03-31 MED ORDER — SODIUM CHLORIDE 0.9% FLUSH
10.0000 mL | INTRAVENOUS | Status: DC | PRN
  Administered 2016-03-31: 10 mL via INTRAVENOUS
  Filled 2016-03-31: qty 10

## 2016-03-31 MED ORDER — LORAZEPAM 0.5 MG PO TABS: ORAL_TABLET | ORAL | Status: DC

## 2016-03-31 MED ORDER — PROCHLORPERAZINE MALEATE 10 MG PO TABS: 10.0000 mg | ORAL_TABLET | Freq: Four times a day (QID) | ORAL | Status: DC | PRN

## 2016-03-31 NOTE — Patient Instructions (Signed)
[place] at [facility] Discharge Instructions  RECOMMENDATIONS MADE BY THE CONSULTANT AND ANY TEST RESULTS WILL BE SENT TO YOUR REFERRING PHYSICIAN.  You were seen and examined by Dr. Whitney Muse. Prescription for Effer K (potassium supplement) called in to your pharmacy, [facility]. Port has been deaccessed and will be flushed today before you leave. Follow-up with clinic in 2 weeks for lab work. Follow-up with Cardiology as soon as possible. Call clinic for any questions or concerns.   Thank you for choosing [place] at [facility] to provide your oncology and hematology care.  To afford each patient quality time with our provider, please arrive at least 15 minutes before your scheduled appointment time.   Beginning January 23rd 2017 lab work for the [name] will be done in the  Main lab at [facility] on 1st floor. If you have a lab appointment with the [facility] please come in thru the  Main Entrance and check in at the main information desk  You need to re-schedule your appointment should you arrive 10 or more minutes late.  We strive to give you quality time with our providers, and arriving late affects you and other patients whose appointments are after yours.  Also, if you no show three or more times for appointments you may be dismissed from the clinic at the providers discretion.     Again, thank you for choosing [facility].  Our hope is that these requests will decrease the amount of time that you wait before being seen by our physicians.       _____________________________________________________________  Should you have questions after your visit to [facility], please contact our office at [phone] between the hours of 8:30 a.m. and 4:30 p.m.  Voicemails left after 4:30 p.m. will not be returned until the following business day.  For prescription refill requests, have your pharmacy contact our  office.         Resources For Cancer Patients and their Caregivers ? American Cancer Society: Can assist with transportation, wigs, general needs, runs Look Good Feel Better.        906-577-6807 ? Cancer Care: Provides financial assistance, online support groups, medication/co-pay assistance.  1-800-813-HOPE 657 165 4245) ? [facility] Assists [place] cancer patients and their families through emotional , educational and financial support.  (562)124-8491 ? [place] DSS Where to apply for food stamps, Medicaid and utility assistance. 4328271213 ? [facility]: Transportation to medical appointments. 318-674-3600 ? Social Security Administration: May apply for disability if have a Stage IV cancer. 816-516-7484 (608)604-8380 ? [employer], Disability and Transit Services: Assists with nutrition, care and transit needs. [place] Support Programs: @10RELATIVEDAYS @ > Cancer Support Group  2nd Tuesday of the month 1pm-2pm, Journey Room  > Creative Journey  3rd Tuesday of the month 1130am-1pm, Journey Room  > Look Good Feel Better  1st Wednesday of the month 10am-12 noon, Journey Room (Call [place] to register [phone])

## 2016-03-31 NOTE — Progress Notes (Signed)
Gina Frederick presented for Portacath access and flush. Proper placement of portacath confirmed by CXR. Portacath located lt chest wall accessed with  H 20 needle. Good blood return present. Portacath flushed with 87ml NS and 500U/44ml Heparin and needle removed intact. Procedure without incident. Patient tolerated procedure well.

## 2016-03-31 NOTE — Progress Notes (Signed)
[place] at [facility] Progress Note  Patient Care Team: Noel Journey. 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 Benjamine Mola 12/21/2015 No obvious primary lesion noted on panendoscopy examination CT neck 12/17/2015 large infiltrative tumor mass extends to skin surface, infiltrates R patorid, R submandibular, R carotid sheath,Thrombosis of R IJ vein, multiple satellite nodules Centrilobular emphysema P16 negative Tobacco Abuse Port a cath/FT placement on 01/17/2016 Multiple dental extractions with Dr. Enrique Sack 01/13/3016    Squamous cell carcinoma of head and neck ([place])   12/17/2015 Imaging CT neck- large infiltrative tumor mass extends to skin surface, infiltrates R patorid, R submandibular, R carotid sheath,Thrombosis of R IJ vein, multiple satellite nodules   12/21/2015 Procedure Soft tissue, biopsy, right neck mass by Dr. Benjamine Mola  Direct laryngoscopy, rigid esophagoscopy, and bronchoscopy    12/21/2015 Pathology Results - SQUAMOUS CELL CARCINOMA.  The carcinoma appears moderately to poorly differentiated.  P16 NEGATIVE.   12/28/2015 Initial Diagnosis Squamous cell carcinoma of head and neck ([place])   01/03/2016 Imaging CT CAP- Large right-sided neck mass. No evidence for lymphadenopathy or metastatic disease in the chest, abdomen, or pelvis. 9 mm ground-glass nodule in the posterior right lung apex is probably related to scarring given the underlying emphysema   01/04/2016 Imaging Bone scan- No evidence of metastatic disease.   01/17/2016 Procedure IR port and feeding tube placed   01/19/2016 PET scan The dominant right neck mass is hypermetabolic. In addition, there are hypermetabolic smaller cervical lymph nodes  bilaterally.  No discrete lesion of the pharyngeal mucosal space identified. The dominant right neck mass and its hypermetabolic activi...   01/31/2016 -  Chemotherapy Cisplatin   02/01/2016 - 03/28/2016 Radiation Therapy 70 Gy by Dr. Isidore Moos.   03/03/2016 - 03/10/2016 Hospital Admission NSTEMI, ARF, CHF, Afib with RVR, respiratory failure with hypoxia    HISTORY OF PRESENTING ILLNESS:  Gina Frederick 60 y.o. 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   Gina Frederick is accompanied by a female friend today.  She completed radiation therapy on Wednesday, 5/24. Dr. Isidore Moos thought she did well. The patient will follow up with radiation in 4 weeks.  States, "my heart feels fine".   She has quit smoking. She has been eating by mouth, "chomping up a storm". She breaks up food into little bits, reporting she was able to eat fish last night and has improved swallowing. She only experiences trouble swallowing if she takes too big of a bite and does not take her time. She has not had any problems with her feeding tube, further explaining she does not use it as much since eating by mouth more regularly.  She has not scheduled an appointment with speech therapy, "I forgot about it".  She is pleased with the improvement in her neck but notes "I can still tell there is tumor there." She denies difficulty breathing, no CP. No nausea or vomiting. No diarrhea or constipation.  MEDICAL HISTORY:  Past Medical History  Diagnosis Date  . Essential hypertension   . Arthritis   . Hyperlipidemia   . Squamous cell carcinoma (HCC)     Invasive - right neck, XRT and Cisplatin - Dr. Whitney Muse  . Edentulous     Multiple  dental extractions March 2017  . Centrilobular emphysema ([place])   . Myocardial infarction ([place]) 03/03/16    SURGICAL HISTORY: Past Surgical History  Procedure Laterality Date  . Tonsillectomy    . Knee arthroscopy Right   . Cesarean section    .  Mass biopsy Right 12/21/2015    Procedure: NECK MASS BIOPSY;  Surgeon: Leta Baptist, MD;  Location: [facility];  Service: ENT;  Laterality: Right;  . Panendoscopy N/A 12/21/2015    Procedure: PANENDOSCOPY;  Surgeon: Leta Baptist, MD;  Location: [facility];  Service: ENT;  Laterality: N/A;  . Multiple extractions with alveoloplasty N/A 01/13/2016    Procedure: EXTRACTION OF TOOTH #'S 6-12,15, 19-28, 30 WITH ALVEOLOPLASTY AND BILATERAL MAXILLARY TUBEROSITY REDUCTIONS;  Surgeon: Lenn Cal, DDS;  Location: [facility];  Service: Oral Surgery;  Laterality: N/A;    SOCIAL HISTORY: Social History   Social History  . Marital Status: Divorced    Spouse Name: N/A  . Number of Children: 1  . Years of Education: N/A   Occupational History  . Not on file.   Social History Main Topics  . Smoking status: Former Smoker -- 0.25 packs/day for 20 years    Types: Cigarettes  . Smokeless tobacco: Never Used     Comment: She took a puff two days ago  . Alcohol Use: No  . Drug Use: No  . Sexual Activity: No   Other Topics Concern  . Not on file   Social History Narrative   Lives with a lady she takes care of.    She is divorced. One daughter, no grandchildren Smokes, notes that she is down to 3 a day. Began smoking "probably in her 9's." She says she has quit "on and off through the years." Denies problems with EtOH. 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 Quit working when her daughter was "of age to go to school," then stayed home with her daughter. For hobbies, she says she likes to read.  Is currently the caregiver for a lady with diabetes.  She does light housekeeping and keeps her on her medications, and cooks. The son of the woman she cares for, Herbie Baltimore, notes that her good cooking has brought his momma's blood pressure and blood sugar down.  She notes she has a good support system with  her sister, and with the son of the woman she takes care of.  FAMILY HISTORY: Family History  Problem Relation Age of Onset  . Alzheimer's disease Mother   . Congestive Heart Failure Father 58   indicated that her mother is deceased. She indicated that her father is deceased.   Mother and father are deceased. Mother was 93; died of Alzheimer's. Father was 71; congestive heart failure. Has a sister, age 39 this year. Had a brother, deceased. He took his own life. 4 nieces and nephews.  ALLERGIES:  is allergic to codeine.  MEDICATIONS:  Current Outpatient Prescriptions  Medication Sig Dispense Refill  . acetaminophen (TYLENOL) 500 MG tablet Take 1,000 mg by mouth every 6 (six) hours as needed for mild pain or moderate pain.     [place] Kitchen apixaban (ELIQUIS) 5 MG TABS tablet Take 1 tablet (5 mg total) by mouth 2 (two) times daily. 60 tablet 3  . atorvastatin (LIPITOR) 40 MG tablet Take 1 tablet (40 mg total) by mouth daily at 6 PM. 30 tablet 3  . benzonatate (TESSALON) 100  MG capsule Take 1 capsule (100 mg total) by mouth 3 (three) times daily as needed for cough. 20 capsule 0  . diphenhydrAMINE (BENADRYL) 25 MG tablet Take 25 mg by mouth every 6 (six) hours as needed. Reported on 03/13/2016    . feeding supplement, ENSURE ENLIVE, (ENSURE ENLIVE) LIQD Take 237 mLs by mouth 2 (two) times daily between meals. 237 mL 0  . fentaNYL (DURAGESIC) 25 MCG/HR patch Place 1 patch (25 mcg total) onto the skin every 3 (three) days. 10 patch 0  . lidocaine-prilocaine (EMLA) cream Apply a quarter size amount to port site 1 hour prior to chemo. Do not rub in. Cover with plastic wrap. 30 g 3  . LORazepam (ATIVAN) 0.5 MG tablet Take 1 tablet every 4-6 hours as needed for nausea/vomiting. 45 tablet 0  . magnesium oxide (MAG-OX) 400 (241.3 Mg) MG tablet Take 1 tablet (400 mg total) by mouth 2 (two) times daily. 60 tablet 3  . nitroGLYCERIN (NITROSTAT) 0.4 MG SL tablet Place 1 tablet (0.4 mg total) under the tongue  every 5 (five) minutes x 3 doses as needed for chest pain. 15 tablet 0  . ondansetron (ZOFRAN) 8 MG tablet TAKE 1 TABLET EVERY 8 HOURS AS NEEDED FOR NAUSEA AND VOMITING 30 tablet 6  . oxyCODONE-acetaminophen (PERCOCET) 10-325 MG tablet Take every 2-4 hours as needed for pain 60 tablet 0  . polyethylene glycol (MIRALAX / GLYCOLAX) packet Take 17 g by mouth daily. 14 each 0  . prochlorperazine (COMPAZINE) 10 MG tablet Take 1 tablet (10 mg total) by mouth every 6 (six) hours as needed for nausea or vomiting. 30 tablet 2  . budesonide-formoterol (SYMBICORT) 160-4.5 MCG/ACT inhaler Inhale 2 puffs into the lungs 2 (two) times daily. (Patient not taking: Reported on 03/13/2016) 1 Inhaler 3  . CISPLATIN IV Inject into the vein. Reported on 01/21/2016    . fluconazole (DIFLUCAN) 100 MG tablet Take 100 mg by mouth daily. Reported on 03/31/2016    . metoprolol (LOPRESSOR) 100 MG tablet Take 1 tablet (100 mg total) by mouth 2 (two) times daily. (Patient not taking: Reported on 03/31/2016) 60 tablet 3  . POT BICARB-POT CHLORIDE,25MEQ, 25 MEQ TBEF Take 1.6 tablets (40 mEq total) by mouth daily. 30 tablet 2  . Tetrahydrozoline-Zn Sulfate (EYE DROPS ALLERGY RELIEF OP) Apply 1 drop to eye 2 (two) times daily as needed (Itching eyes). Reported on 03/31/2016    . tiotropium (SPIRIVA HANDIHALER) 18 MCG inhalation capsule Place 1 capsule (18 mcg total) into inhaler and inhale daily. (Patient not taking: Reported on 03/13/2016) 30 capsule 3  . zolpidem (AMBIEN) 10 MG tablet Take 1 tablet (10 mg total) by mouth at bedtime as needed for sleep. 30 tablet 0   Current Facility-Administered Medications  Medication Dose Route Frequency Provider Last Rate Last Dose  . sodium chloride flush (NS) 0.9 % injection 10 mL  10 mL Intravenous PRN Patrici Ranks, MD   10 mL at 03/31/16 1159   Facility-Administered Medications Ordered in Other Visits  Medication Dose Route Frequency Provider Last Rate Last Dose  . sodium polystyrene  (KAYEXALATE) 15 GM/60ML suspension 30 g  30 g Oral Once Baird Cancer, PA-C        Review of Systems  Constitutional: Positive for malaise/fatigue. Negative for fever and chills.  HENT: Negative for congestion, hearing loss, nosebleeds, sore throat and tinnitus.        Hoarse voice.  Eyes: Negative.  Negative for blurred vision, double vision, pain and  discharge.  Respiratory: Negative for hemoptysis, shortness of breath and wheezing.   Cardiovascular: Positive for leg swelling. Negative for chest pain, palpitations, claudication and PND.       Feet/ankle swelling.  Gastrointestinal: Negative for heartburn, vomiting, abdominal pain, diarrhea, constipation, blood in stool and melena.  Genitourinary: Negative.  Negative for dysuria, urgency, frequency and hematuria.  Musculoskeletal: Negative for myalgias, joint pain and falls.  Skin: Negative.  Negative for itching and rash.  Neurological: Positive for weakness. Negative for dizziness, tingling, tremors, sensory change, speech change, focal weakness, seizures, loss of consciousness and headaches.  Endo/Heme/Allergies: Negative.  Does not bruise/bleed easily.  Psychiatric/Behavioral: Negative for depression, suicidal ideas, memory loss and substance abuse. The patient is not nervous/anxious.   All other systems reviewed and are negative.  14 point ROS was done and is otherwise as detailed above or in HPI   PHYSICAL EXAMINATION: ECOG PERFORMANCE STATUS: 1 - Symptomatic but completely ambulatory Vitals - 1 value per visit [id number]  SYSTOLIC [id number]  DIASTOLIC 79  Pulse 92  Temperature 98.4  Respirations 14  Weight (lb) 139.1  Height   BMI 23.86  VISIT REPORT    Physical Exam  Constitutional: She is oriented to person, place, and time and well-developed, well-nourished, and in no distress.  Wears glasses.   HENT:  Head: Normocephalic and atraumatic.  Nose: Nose normal.  Mouth/Throat: No oropharyngeal exudate.  Mild oropharyngeal  erythema noted  Eyes: Conjunctivae and EOM are normal. Pupils are equal, round, and reactive to light. Right eye exhibits no discharge. Left eye exhibits no discharge. No scleral icterus.  Neck: Normal range of motion. Neck supple. No tracheal deviation present. No thyromegaly present.  Neck mass still noted, smaller, significant XRT changes L neck greater than R  Cardiovascular: Normal rate, regular rhythm and normal heart sounds.  Exam reveals no gallop and no friction rub.   No murmur heard. Pulmonary/Chest: Effort normal and breath sounds normal. She has no wheezes. She has no rales.  Abdominal: Soft. Bowel sounds are normal. She exhibits no distension and no mass. There is no tenderness. There is no rebound and no guarding.  FT C/D/I  Musculoskeletal: Normal range of motion. She exhibits edema.  Minimal bilateral lower extremity edema.  Lymphadenopathy:    She has no cervical adenopathy.  Neurological: She is alert and oriented to person, place, and time. She has normal reflexes. No cranial nerve deficit. Gait normal. Coordination normal.  Skin: Skin is warm and dry. No rash noted.  Psychiatric: Mood, memory, affect and judgment normal.  Nursing note and vitals reviewed.   LABORATORY DATA:  I have reviewed the data as listed  Lab Results  Component Value Date   WBC 4.8 03/31/2016   HGB 8.9* 03/31/2016   HCT 26.7* 03/31/2016   MCV 94.0 03/31/2016   PLT 167 03/31/2016   CMP     Component Value Date/Time   NA 127* 03/31/2016 0945   K 3.1* 03/31/2016 0945   CL 85* 03/31/2016 0945   CO2 33* 03/31/2016 0945   GLUCOSE 119* 03/31/2016 0945   BUN 20 03/31/2016 0945   CREATININE 1.09* 03/31/2016 0945   CALCIUM 8.4* 03/31/2016 0945   PROT 6.1* 03/31/2016 0945   ALBUMIN 3.0* 03/31/2016 0945   AST 26 03/31/2016 0945   ALT 17 03/31/2016 0945   ALKPHOS 80 03/31/2016 0945   BILITOT 0.6 03/31/2016 0945   GFRNONAA 54* 03/31/2016 0945   GFRAA >60 03/31/2016 0945    RADIOGRAPHIC  STUDIES: I have  personally reviewed the radiological images as listed and agreed with the findings in the report. Study Result     CLINICAL DATA: Elevated creatinine.  EXAM: RENAL / URINARY TRACT ULTRASOUND COMPLETE  COMPARISON: PET-CT - 01/19/2016 ; CT the chest, abdomen pelvis - 01/03/2016  FINDINGS: Right Kidney:  Normal cortical thickness, echogenicity and size, measuring 10.9 cm in length. No focal renal lesions. No echogenic renal stones. No urinary obstruction.  Left Kidney:  Normal cortical thickness, echogenicity and size, measuring 11.2 cm in length. No focal renal lesions. No echogenic renal stones. No urinary obstruction.  Bladder:  Appears normal for degree of bladder distention.  IMPRESSION: No explanation for patient's elevated creatinine. Specifically, no evidence of urinary obstruction.   Electronically Signed  By: Sandi Mariscal M.D.  On: 03/08/2016 20:41    CLINICAL DATA: Initial treatment strategy for squamous cell carcinoma of the right neck.  EXAM: NUCLEAR MEDICINE PET SKULL BASE TO THIGH  TECHNIQUE: 7.61 mCi F-18 FDG was injected intravenously. Full-ring PET imaging was performed from the skull base to thigh after the radiotracer. CT data was obtained and used for attenuation correction and anatomic localization.  FASTING BLOOD GLUCOSE: Value: 98 mg/dl  COMPARISON: CTs of the neck, 12/17/2015, and chest, abdomen and pelvis, 01/02/2015. Bone scan 01/03/2015.  FINDINGS: NECK  The dominant right neck mass is markedly hypermetabolic with an SUV max of 20.7. This mass measures approximately 7.0 x 6.8 cm on image 36. There are multiple additional hypermetabolic cervical lymph nodes bilaterally. These include a right level 4 node measuring 14 mm on image 43 (SUV max 9.9), and small left-sided level 2 (10 mm on image 28) and level 4 nodes on image 41 (measuring 7 and 10 mm, SUV max 11.1). The dominant right neck  mass appears contiguous with the right lobe of the thyroid and maybe invading it.No lesions of the pharyngeal mucosal space are identified. There is stable mass effect on the larynx which is displaced to the left.  CHEST  There are no hypermetabolic mediastinal, hilar or axillary lymph nodes. There is no suspicious pulmonary activity. Emphysema is noted. The small ground-glass nodule previously noted in the right upper lobe measures 6 mm on image number 6, too small to evaluate by PET-CT.  ABDOMEN/PELVIS  There is no hypermetabolic activity within the liver, adrenal glands, spleen or pancreas. There is no hypermetabolic nodal activity. There is physiologic activity within the left anterior abdominal wall surrounding a percutaneous G-tube. There is a small amount of extraluminal air within the left upper quadrant of the abdomen and in the anterior abdominal wall, attributed to this G-tube which was placed 2 days ago. Cholelithiasis, mild aortoiliac atherosclerosis and sigmoid diverticulosis are noted.  [place]  There is no hypermetabolic activity to suggest osseous metastatic disease.  IMPRESSION: 1. The dominant right neck mass is hypermetabolic. In addition, there are hypermetabolic smaller cervical lymph nodes bilaterally. 2. No discrete lesion of the pharyngeal mucosal space identified. The dominant right neck mass and its hypermetabolic activity are contiguous with the right thyroid lobe and may be invading it. 3. No suspicious activity within the chest, abdomen or pelvis. 4. Small ground-glass nodule in the right upper lobe is too small to evaluate by PET-CT. Attention on follow-up recommended. 5. Small amount of extraluminal air in the upper abdomen attributed to recent G-tube placement.   Electronically Signed  By: Richardean Sale M.D.  On: 01/19/2016 13:01  PATHOLOGY:   ASSESSMENT & PLAN:  Squamous Cell Carcinoma R Neck, moderately to poorly  differentiated, [id number] Open biopsy, direct laryngoscopy, rigid esophagoscopy, bronchoscopy with Dr. Benjamine Mola 12/21/2015 No obvious primary lesion noted on panendoscopy examination CT neck 12/17/2015 large infiltrative tumor mass extends to skin surface, infiltrates R patorid, R submandibular, R carotid sheath,Thrombosis of R IJ vein, multiple satellite nodules Centrilobular emphysema P16 negative Tobacco Use Tobacco Abuse Port a cath/FT placement on 01/17/2016 Multiple dental extractions with Dr. Enrique Sack 01/13/3016 Hyponatremia HTN NSTEMI  Based upon her PE, I still feel she has significant residual disease and unfortunately did not complete recommended chemotherapy. Because of her kidney function she will not be able to tolerate her last cycle of cisplatin.  I will discuss with Dr. Isidore Moos, if she is in agreement I believe that the patient should receive additional chemotherapy, perhaps carbo/taxol weekly for several cycles.  I will have Hildred Alamin refer the patient back to speech therapy.  The patient is scheduled to follow up with radiation oncology in 4 weeks. She completed radiation treatment on Wednesday, 5/24.  I have referred her back to cardiology. She was to schedule a follow-up appointment 4 weeks after her discharge but did not. We will refer her downstairs.. I anticipate chemotherapy treatment to begin in two weeks.  Her port has been accessed since her heart attack. This needs to be de-accessed.  I have refilled her compazine, ativan, magnesium, and ambien. She was instructed to restart her potassium. Hyponatremia has been an ongoing issue Initially thought to be secondary to her HCTZ.   I anticipate she will need renal dosing of aranesp for her anemia. Will discuss this at follow-up.  She will return for follow up in 2 weeks.  All questions were answered. The patient knows to call the clinic with any problems, questions or concerns.  This document serves as a record of services  personally performed by Ancil Linsey, MD. It was created on her behalf by Arlyce Harman, a trained medical scribe. The creation of this record is based on the scribe's personal observations and the provider's statements to them. This document has been checked and approved by the attending provider.  I have reviewed the above documentation for accuracy and completeness, and I agree with the above.  This note was electronically signed.  Molli Hazard, MD  04/02/2016 5:29 PM

## 2016-04-02 ENCOUNTER — Encounter (HOSPITAL_COMMUNITY): Payer: Self-pay | Admitting: Hematology & Oncology

## 2016-04-07 ENCOUNTER — Ambulatory Visit (INDEPENDENT_AMBULATORY_CARE_PROVIDER_SITE_OTHER): Payer: Medicaid Other | Admitting: Cardiology

## 2016-04-07 ENCOUNTER — Other Ambulatory Visit (HOSPITAL_COMMUNITY): Payer: Self-pay | Admitting: Hematology & Oncology

## 2016-04-07 ENCOUNTER — Encounter: Payer: Self-pay | Admitting: Cardiology

## 2016-04-07 VITALS — BP 128/82 | HR 99 | Ht 64.0 in | Wt 132.8 lb

## 2016-04-07 DIAGNOSIS — I214 Non-ST elevation (NSTEMI) myocardial infarction: Secondary | ICD-10-CM | POA: Diagnosis not present

## 2016-04-07 DIAGNOSIS — E782 Mixed hyperlipidemia: Secondary | ICD-10-CM | POA: Diagnosis not present

## 2016-04-07 DIAGNOSIS — I255 Ischemic cardiomyopathy: Secondary | ICD-10-CM | POA: Diagnosis not present

## 2016-04-07 DIAGNOSIS — I10 Essential (primary) hypertension: Secondary | ICD-10-CM

## 2016-04-07 NOTE — Progress Notes (Signed)
Cardiology Office Note  Date: 04/07/2016   ID: Gina Frederick, DOB 05-13-1956, MRN [id number]  PCP: Raiford Simmonds., PA-C  Referring provider: Dr. Ancil Linsey Consulting Cardiologist: Rozann Lesches, MD   Chief Complaint  Patient presents with  . Coronary Artery Disease  . Recent NSTEMI    History of Present Illness: Gina Frederick is a medically complex 60 y.o. female referred by Dr. Whitney Muse to establish cardiology follow-up. She has already been evaluated by our practice during hospitalization in April, was assessed by Dr. Percival Spanish at that time with NSTEMI that was managed medically in light of comorbid illnesses. I reviewed her records, she also had paroxysmal atrial fibrillation and was placed on Eliquis, LVEF was documented in the range of 40-45% with wall motion abnormalities consistent with ischemic cardiomyopathy. She was to follow-up with Dr. Percival Spanish in the [place] office.  She just recently saw Dr. Whitney Muse on May 26. 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 It looks like she is being considered for additional chemotherapy, possibly carboplatin/Taxol based on the notes.  Most recent lab work is outlined below.  She is here today with a significant other. Does not report any exertional chest pain, functional with basic ADLs. She feels like she has somewhat more energy. Still has hoarseness with some swallowing difficulties. Also leg edema which gets better when she puts her feet up at nighttime.  Today we discussed her diagnosis of heart disease and plan for medical therapy at this time. She is at risk for further cardiomyopathy with chemotherapy as well. I reviewed her medications which are outlined below.  Past Medical History  Diagnosis Date  . Essential hypertension   . Arthritis   . Hyperlipidemia   . Squamous cell carcinoma (HCC)     Invasive - right neck,  XRT and Cisplatin - Dr. Whitney Muse  . Edentulous     Multiple dental extractions March 2017  . Centrilobular emphysema ([place])   . NSTEMI (non-ST elevated myocardial infarction) [facility])     April 2017 - managed medically due to comorbid illnesses  . Ischemic cardiomyopathy     LVEF 40-45%    Past Surgical History  Procedure Laterality Date  . Tonsillectomy    . Knee arthroscopy Right   . Cesarean section    . Mass biopsy Right 12/21/2015    Procedure: NECK MASS BIOPSY;  Surgeon: Leta Baptist, MD;  Location: [facility];  Service: ENT;  Laterality: Right;  . Panendoscopy N/A 12/21/2015    Procedure: PANENDOSCOPY;  Surgeon: Leta Baptist, MD;  Location: [facility];  Service: ENT;  Laterality: N/A;  . Multiple extractions with alveoloplasty N/A 01/13/2016    Procedure: EXTRACTION OF TOOTH #'S 6-12,15, 19-28, 30 WITH ALVEOLOPLASTY AND BILATERAL MAXILLARY TUBEROSITY REDUCTIONS;  Surgeon: Lenn Cal, DDS;  Location: [facility];  Service: Oral Surgery;  Laterality: N/A;    Current Outpatient Prescriptions  Medication Sig Dispense Refill  . acetaminophen (TYLENOL) 500 MG tablet Take 1,000 mg by mouth every 6 (six) hours as needed for mild pain or moderate pain.     Marland Kitchen apixaban (ELIQUIS) 5 MG TABS tablet Take 1 tablet (5 mg total) by mouth 2 (two) times daily. 60 tablet 3  . atorvastatin (LIPITOR) 40 MG tablet Take 1 tablet (40 mg total) by mouth daily at 6 PM. 30 tablet 3  . benzonatate (TESSALON) 100 MG capsule Take 1 capsule (100 mg  total) by mouth 3 (three) times daily as needed for cough. 20 capsule 0  . budesonide-formoterol (SYMBICORT) 160-4.5 MCG/ACT inhaler Inhale 2 puffs into the lungs 2 (two) times daily. 1 Inhaler 3  . CISPLATIN IV Inject into the vein. Reported on 01/21/2016    . diphenhydrAMINE (BENADRYL) 25 MG tablet Take 25 mg by mouth every 6 (six) hours as needed. Reported on 03/13/2016    . feeding supplement, ENSURE ENLIVE, (ENSURE ENLIVE) LIQD Take 237 mLs by mouth 2  (two) times daily between meals. 237 mL 0  . fentaNYL (DURAGESIC) 25 MCG/HR patch Place 1 patch (25 mcg total) onto the skin every 3 (three) days. 10 patch 0  . fluconazole (DIFLUCAN) 100 MG tablet Take 100 mg by mouth daily. Reported on 03/31/2016    . lidocaine-prilocaine (EMLA) cream Apply a quarter size amount to port site 1 hour prior to chemo. Do not rub in. Cover with plastic wrap. 30 g 3  . LORazepam (ATIVAN) 0.5 MG tablet Take 1 tablet every 4-6 hours as needed for nausea/vomiting. 45 tablet 0  . magnesium oxide (MAG-OX) 400 (241.3 Mg) MG tablet Take 1 tablet (400 mg total) by mouth 2 (two) times daily. 60 tablet 3  . metoprolol (LOPRESSOR) 100 MG tablet Take 1 tablet (100 mg total) by mouth 2 (two) times daily. 60 tablet 3  . nitroGLYCERIN (NITROSTAT) 0.4 MG SL tablet Place 1 tablet (0.4 mg total) under the tongue every 5 (five) minutes x 3 doses as needed for chest pain. 15 tablet 0  . ondansetron (ZOFRAN) 8 MG tablet TAKE 1 TABLET EVERY 8 HOURS AS NEEDED FOR NAUSEA AND VOMITING 30 tablet 6  . oxyCODONE-acetaminophen (PERCOCET) 10-325 MG tablet Take every 2-4 hours as needed for pain 60 tablet 0  . polyethylene glycol (MIRALAX / GLYCOLAX) packet Take 17 g by mouth daily. 14 each 0  . POT BICARB-POT CHLORIDE,25MEQ, 25 MEQ TBEF Take 1.6 tablets (40 mEq total) by mouth daily. 30 tablet 2  . prochlorperazine (COMPAZINE) 10 MG tablet Take 1 tablet (10 mg total) by mouth every 6 (six) hours as needed for nausea or vomiting. 30 tablet 2  . Tetrahydrozoline-Zn Sulfate (EYE DROPS ALLERGY RELIEF OP) Apply 1 drop to eye 2 (two) times daily as needed (Itching eyes). Reported on 03/31/2016    . tiotropium (SPIRIVA HANDIHALER) 18 MCG inhalation capsule Place 1 capsule (18 mcg total) into inhaler and inhale daily. 30 capsule 3  . zolpidem (AMBIEN) 10 MG tablet Take 1 tablet (10 mg total) by mouth at bedtime as needed for sleep. 30 tablet 0   No current facility-administered medications for this visit.    Facility-Administered Medications Ordered in Other Visits  Medication Dose Route Frequency Provider Last Rate Last Dose  . sodium polystyrene (KAYEXALATE) 15 GM/60ML suspension 30 g  30 g Oral Once Baird Cancer, PA-C       Allergies:  Codeine   Social History: The patient  reports that she has quit smoking. Her smoking use included Cigarettes. She has a 5 pack-year smoking history. She has never used smokeless tobacco. She reports that she does not drink alcohol or use illicit drugs.   Family History: The patient's family history includes Alzheimer's disease in her mother; Congestive Heart Failure (age of onset: 37) in her father.   ROS:  Please see the history of present illness. Otherwise, complete review of systems is positive for generalized weakness.  All other systems are reviewed and negative.   Physical Exam: VS:  BP 128/82 mmHg  Pulse 99  Ht 5\' 4"  (1.626 m)  Wt 132 lb 12.8 oz (60.238 kg)  BMI 22.78 kg/m2  SpO2 97%, BMI Body mass index is 22.78 kg/(m^2).  Wt Readings from Last 3 Encounters:  04/07/16 132 lb 12.8 oz (60.238 kg)  03/31/16 139 lb 1.6 oz (63.095 kg)  03/10/16 142 lb 3.2 oz (64.5 kg)    General: Chronically ill-appearing woman in no distress, hoarse with talking. HEENT: Conjunctiva and lids normal, mass right side of neck. Neck: Supple, obvious elevated JVP or carotid bruits. Lungs: Decreased breath sounds without wheezing, nonlabored breathing at rest. Cardiac: Regular rate and rhythm, no S3, soft systolic murmur, no pericardial rub. Abdomen: Soft, nontender, bowel sounds present, no guarding or rebound. Extremities: 2+ lower leg edema, distal pulses 1-2+. Skin: Warm and dry. Musculoskeletal: No kyphosis. Neuropsychiatric: Alert and oriented x3, affect grossly appropriate.  ECG: I personally reviewed the tracing from 03/06/2016 which showed sinus tachycardia with nonspecific ST-T changes.  Recent Labwork: 01/21/2016: TSH 4.179 03/08/2016: B Natriuretic  Peptide >4500.0* 03/31/2016: ALT 17; AST 26; BUN 20; Creatinine, Ser 1.09*; Hemoglobin 8.9*; Magnesium 2.6*; Platelets 167; Potassium 3.1*; Sodium 127*   Other Studies Reviewed Today:  Echocardiogram 03/04/2016: Study Conclusions  - Left ventricle: The cavity size was normal. Systolic function was  mildly to moderately reduced. The estimated ejection fraction was  in the range of 40% to 45%. Severe hypokinesis of the inferior  myocardium. - Left atrium: The atrium was mildly to moderately dilated. - Right atrium: The atrium was mildly dilated. - Tricuspid valve: There was moderate regurgitation. - Pulmonary arteries: Systolic pressure was moderately increased.  PA peak pressure: 48 mm Hg (S).  Chest x-ray 03/03/2016: IMPRESSION: 1. Hyperexpanded lungs indicating COPD/emphysema. 2. Prominent interstitial markings bilaterally, new compared to the recent PET-CT, suggesting acute interstitial edema related to CHF/volume overload. 3. Mild cardiomegaly.  Assessment and Plan:  1. Status post NSTEMI in late April with evidence of ischemic cardiomyopathy and LVEF 40-45%, inferior hypokinesis noted. She was managed medically in light of comorbid illnesses and at this point does not report any angina symptoms. Plan to continue high-dose beta blocker, statin, and as needed nitroglycerin. Reluctant to place her on a standing diuretic in light of propensity for dehydration and renal insufficiency with chemotherapy. Also holding off ACE inhibitor or ARB.  2. 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 She has been treated with XRT and cisplatin, to undergo additional chemotherapy within the next few weeks per patient report. She is at risk for advancing cardiomyopathy and will need to have follow-up serial echocardiography along the way.  3. Essential hypertension, blood pressure is adequately controlled today.  4. Hyperlipidemia, on Lipitor.  Current  medicines were reviewed with the patient today.  Disposition: FU with me in 6 weeks.   Signed, Satira Sark, MD, [facility] 04/07/2016 9:13 AM    [facility]. [address], [place], [place] [address] Phone: [phone]; Fax: [phone]

## 2016-04-07 NOTE — Patient Instructions (Signed)
Your physician recommends that you schedule a follow-up appointment in: 6 weeks with Dr Domenic Polite    Your physician recommends that you continue on your current medications as directed. Please refer to the Current Medication list given to you today.    Thank you for choosing [facility] !

## 2016-04-14 ENCOUNTER — Encounter (HOSPITAL_BASED_OUTPATIENT_CLINIC_OR_DEPARTMENT_OTHER): Payer: Medicaid Other | Admitting: Hematology & Oncology

## 2016-04-14 ENCOUNTER — Encounter (HOSPITAL_COMMUNITY): Payer: Medicaid Other | Attending: Hematology & Oncology

## 2016-04-14 ENCOUNTER — Encounter: Payer: Self-pay | Admitting: Dietician

## 2016-04-14 VITALS — BP 127/85 | HR 96 | Temp 98.0°F | Resp 20 | Wt 127.6 lb

## 2016-04-14 DIAGNOSIS — R110 Nausea: Secondary | ICD-10-CM

## 2016-04-14 DIAGNOSIS — C760 Malignant neoplasm of head, face and neck: Secondary | ICD-10-CM

## 2016-04-14 DIAGNOSIS — Z8673 Personal history of transient ischemic attack (TIA), and cerebral infarction without residual deficits: Secondary | ICD-10-CM | POA: Diagnosis not present

## 2016-04-14 DIAGNOSIS — R634 Abnormal weight loss: Secondary | ICD-10-CM

## 2016-04-14 DIAGNOSIS — D649 Anemia, unspecified: Secondary | ICD-10-CM | POA: Diagnosis present

## 2016-04-14 DIAGNOSIS — I10 Essential (primary) hypertension: Secondary | ICD-10-CM | POA: Diagnosis not present

## 2016-04-14 DIAGNOSIS — J432 Centrilobular emphysema: Secondary | ICD-10-CM | POA: Diagnosis not present

## 2016-04-14 DIAGNOSIS — Z86711 Personal history of pulmonary embolism: Secondary | ICD-10-CM | POA: Diagnosis not present

## 2016-04-14 LAB — CBC WITH DIFFERENTIAL/PLATELET
BASOS ABS: 0 10*3/uL (ref 0.0–0.1)
BASOS PCT: 0 %
Eosinophils Absolute: 0.1 10*3/uL (ref 0.0–0.7)
Eosinophils Relative: 1 %
HEMATOCRIT: 26 % — AB (ref 36.0–46.0)
HEMOGLOBIN: 8.7 g/dL — AB (ref 12.0–15.0)
LYMPHS PCT: 11 %
Lymphs Abs: 0.6 10*3/uL — ABNORMAL LOW (ref 0.7–4.0)
MCH: 32.6 pg (ref 26.0–34.0)
MCHC: 33.5 g/dL (ref 30.0–36.0)
MCV: 97.4 fL (ref 78.0–100.0)
MONO ABS: 0.5 10*3/uL (ref 0.1–1.0)
MONOS PCT: 9 %
NEUTROS ABS: 4.6 10*3/uL (ref 1.7–7.7)
NEUTROS PCT: 79 %
Platelets: 230 10*3/uL (ref 150–400)
RBC: 2.67 MIL/uL — ABNORMAL LOW (ref 3.87–5.11)
RDW: 19.7 % — AB (ref 11.5–15.5)
WBC: 5.8 10*3/uL (ref 4.0–10.5)

## 2016-04-14 LAB — COMPREHENSIVE METABOLIC PANEL
ALBUMIN: 3 g/dL — AB (ref 3.5–5.0)
ALK PHOS: 92 U/L (ref 38–126)
ALT: 13 U/L — AB (ref 14–54)
AST: 18 U/L (ref 15–41)
Anion gap: 9 (ref 5–15)
BILIRUBIN TOTAL: 0.5 mg/dL (ref 0.3–1.2)
BUN: 16 mg/dL (ref 6–20)
CALCIUM: 9.2 mg/dL (ref 8.9–10.3)
CO2: 30 mmol/L (ref 22–32)
CREATININE: 0.95 mg/dL (ref 0.44–1.00)
Chloride: 94 mmol/L — ABNORMAL LOW (ref 101–111)
GFR calc Af Amer: >60 mL/min (ref >60)
GLUCOSE: 111 mg/dL — AB (ref 65–99)
POTASSIUM: 4.2 mmol/L (ref 3.5–5.1)
Sodium: 133 mmol/L — ABNORMAL LOW (ref 135–145)
TOTAL PROTEIN: 6.3 g/dL — AB (ref 6.5–8.1)

## 2016-04-14 LAB — MAGNESIUM: MAGNESIUM: 1.9 mg/dL (ref 1.7–2.4)

## 2016-04-14 MED ORDER — OMEPRAZOLE 40 MG PO CPDR: 40.0000 mg | DELAYED_RELEASE_CAPSULE | Freq: Every day | ORAL | Status: DC

## 2016-04-14 NOTE — Progress Notes (Signed)
[place] [facility] [place] NOTE  Patient Care Team: Noel Journey. 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 Benjamine Mola 12/21/2015 No obvious primary lesion noted on panendoscopy examination CT neck 12/17/2015 large infiltrative tumor mass extends to skin surface, infiltrates R patorid, R submandibular, R carotid sheath,Thrombosis of R IJ vein, multiple satellite nodules Centrilobular emphysema P16 negative Tobacco Abuse Port a cath/FT placement on 01/17/2016 Multiple dental extractions with Dr. Enrique Sack 01/13/3016    Squamous cell carcinoma of head and neck ([place])   12/17/2015 Imaging CT neck- large infiltrative tumor mass extends to skin surface, infiltrates R patorid, R submandibular, R carotid sheath,Thrombosis of R IJ vein, multiple satellite nodules   12/21/2015 Procedure Soft tissue, biopsy, right neck mass by Dr. Benjamine Mola  Direct laryngoscopy, rigid esophagoscopy, and bronchoscopy    12/21/2015 Pathology Results - SQUAMOUS CELL CARCINOMA.  The carcinoma appears moderately to poorly differentiated.  P16 NEGATIVE.   12/28/2015 Initial Diagnosis Squamous cell carcinoma of head and neck ([place])   01/03/2016 Imaging CT CAP- Large right-sided neck mass. No evidence for lymphadenopathy or metastatic disease in the chest, abdomen, or pelvis. 9 mm ground-glass nodule in the posterior right lung apex is probably related to scarring given the underlying emphysema   01/04/2016 Imaging Bone scan- No evidence of metastatic disease.   01/17/2016 Procedure IR port and feeding tube placed   01/19/2016 PET scan The dominant right neck mass is hypermetabolic. In addition, there are hypermetabolic smaller cervical lymph nodes  bilaterally.  No discrete lesion of the pharyngeal mucosal space identified. The dominant right neck mass and its hypermetabolic activi...   01/31/2016 -  Chemotherapy Cisplatin   02/01/2016 - 03/28/2016 Radiation Therapy 70 Gy by Dr. Isidore Moos.   03/03/2016 - 03/10/2016 Hospital Admission NSTEMI, ARF, CHF, Afib with RVR, respiratory failure with hypoxia    HISTORY OF PRESENTING ILLNESS:  Gina Frederick 60 y.o. 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   Ms. Bost returns to the [facility] today accompanied by a female friend. Today she notes that she's not doing too great. She confirms that she's eating a little bit; "not a whole lot, just what I can get in and swallow."  She has an appointment on the 27th with Dr. Isidore Moos, where her PET scan will be ordered. She has missed multiple appointments with speech therapy. She did see Dr. Domenic Polite.   She had a cigarette the other day, noting that she smoked it "because I'm stupid." States again "my heart feels fine."  She says she has nausea. She notes that she has not had vomiting, she says "a couple of times I have, but it's just the constant thinking that you're going to is 24/7." She says she is taking her nausea meds all the time and notes "I don't know if they're strong enough." She repeats that she's not actually vomiting, just feels like she's going to. She is willing to try taking Prilosec to try to help this.  She confirms that she's been using her feeding tube for about four cans a day, and was even going up to five. She says she's been eating what she can by mouth.  She still hasn't been to speech therapy yet, saying "  no, I have to call them."  She denies pain. She denies SOB or CP. She notes she is sleeping. Weight is down 12 pounds from 5/26.    MEDICAL HISTORY:  Past Medical History  Diagnosis Date  . Essential hypertension   . Arthritis   . Hyperlipidemia   . Squamous cell  carcinoma (HCC)     Invasive - right neck, XRT and Cisplatin - Dr. Whitney Muse  . Edentulous     Multiple dental extractions March 2017  . Centrilobular emphysema ([place])   . NSTEMI (non-ST elevated myocardial infarction) [facility])     April 2017 - managed medically due to comorbid illnesses  . Ischemic cardiomyopathy     LVEF 40-45%    SURGICAL HISTORY: Past Surgical History  Procedure Laterality Date  . Tonsillectomy    . Knee arthroscopy Right   . Cesarean section    . Mass biopsy Right 12/21/2015    Procedure: NECK MASS BIOPSY;  Surgeon: Leta Baptist, MD;  Location: [facility];  Service: ENT;  Laterality: Right;  . Panendoscopy N/A 12/21/2015    Procedure: PANENDOSCOPY;  Surgeon: Leta Baptist, MD;  Location: [facility];  Service: ENT;  Laterality: N/A;  . Multiple extractions with alveoloplasty N/A 01/13/2016    Procedure: EXTRACTION OF TOOTH #'S 6-12,15, 19-28, 30 WITH ALVEOLOPLASTY AND BILATERAL MAXILLARY TUBEROSITY REDUCTIONS;  Surgeon: Lenn Cal, DDS;  Location: [facility];  Service: Oral Surgery;  Laterality: N/A;    SOCIAL HISTORY: Social History   Social History  . Marital Status: Divorced    Spouse Name: N/A  . Number of Children: 1  . Years of Education: N/A   Occupational History  . Not on file.   Social History Main Topics  . Smoking status: Former Smoker -- 0.25 packs/day for 20 years    Types: Cigarettes  . Smokeless tobacco: Never Used     Comment: She took a puff two days ago  . Alcohol Use: No  . Drug Use: No  . Sexual Activity: No   Other Topics Concern  . Not on file   Social History Narrative   Lives with a lady she takes care of.    She is divorced. One daughter, no grandchildren Smokes, notes that she is down to 3 a day. Began smoking "probably in her 55's." She says she has quit "on and off through the years." Denies problems with EtOH. 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 Quit working when her daughter was "of age to go to school," then stayed home with her daughter. For hobbies, she says she likes to read.  Is currently the caregiver for a lady with diabetes.  She does light housekeeping and keeps her on her medications, and cooks. The son of the woman she cares for, Herbie Baltimore, notes that her good cooking has brought his momma's blood pressure and blood sugar down.  She notes she has a good support system with her sister, and with the son of the woman she takes care of.  FAMILY HISTORY: Family History  Problem Relation Age of Onset  . Alzheimer's disease Mother   . Congestive Heart Failure Father 35   indicated that her mother is deceased. She indicated that her father is deceased.   Mother and father are deceased. Mother was 45; died of Alzheimer's. Father was 60; congestive heart failure. Has a sister, age 28 this year. Had a brother,  deceased. He took his own life. 4 nieces and nephews.  ALLERGIES:  is allergic to codeine.  MEDICATIONS:  Current Outpatient Prescriptions  Medication Sig Dispense Refill  . acetaminophen (TYLENOL) 500 MG tablet Take 1,000 mg by mouth every 6 (six) hours as needed for mild pain or moderate pain.     Marland Kitchen apixaban (ELIQUIS) 5 MG TABS tablet Take 1 tablet (5 mg total) by mouth 2 (two) times daily. 60 tablet 3  . atorvastatin (LIPITOR) 40 MG tablet Take 1 tablet (40 mg total) by mouth daily at 6 PM. 30 tablet 3  . benzonatate (TESSALON) 100 MG capsule Take 1 capsule (100 mg total) by mouth 3 (three) times daily as needed for cough. 20 capsule 0  . budesonide-formoterol (SYMBICORT) 160-4.5 MCG/ACT inhaler Inhale 2 puffs into the lungs 2 (two) times daily. 1 Inhaler 3  . CISPLATIN IV Inject into the vein. Reported on 01/21/2016    . diphenhydrAMINE (BENADRYL) 25 MG tablet Take 25 mg by mouth every 6 (six) hours as needed. Reported on 03/13/2016    . feeding supplement, ENSURE ENLIVE,  (ENSURE ENLIVE) LIQD Take 237 mLs by mouth 2 (two) times daily between meals. 237 mL 0  . fentaNYL (DURAGESIC) 25 MCG/HR patch Place 1 patch (25 mcg total) onto the skin every 3 (three) days. 10 patch 0  . fluconazole (DIFLUCAN) 100 MG tablet Take 100 mg by mouth daily. Reported on 03/31/2016    . lidocaine-prilocaine (EMLA) cream Apply a quarter size amount to port site 1 hour prior to chemo. Do not rub in. Cover with plastic wrap. 30 g 3  . LORazepam (ATIVAN) 0.5 MG tablet Take 1 tablet every 4-6 hours as needed for nausea/vomiting. 45 tablet 0  . magnesium oxide (MAG-OX) 400 (241.3 Mg) MG tablet Take 1 tablet (400 mg total) by mouth 2 (two) times daily. 60 tablet 3  . metoprolol (LOPRESSOR) 100 MG tablet Take 1 tablet (100 mg total) by mouth 2 (two) times daily. 60 tablet 3  . nitroGLYCERIN (NITROSTAT) 0.4 MG SL tablet Place 1 tablet (0.4 mg total) under the tongue every 5 (five) minutes x 3 doses as needed for chest pain. 15 tablet 0  . ondansetron (ZOFRAN) 8 MG tablet TAKE 1 TABLET EVERY 8 HOURS AS NEEDED FOR NAUSEA AND VOMITING 30 tablet 6  . oxyCODONE-acetaminophen (PERCOCET) 10-325 MG tablet Take every 2-4 hours as needed for pain 60 tablet 0  . polyethylene glycol (MIRALAX / GLYCOLAX) packet Take 17 g by mouth daily. 14 each 0  . POT BICARB-POT CHLORIDE,25MEQ, 25 MEQ TBEF Take 1.6 tablets (40 mEq total) by mouth daily. 30 tablet 2  . prochlorperazine (COMPAZINE) 10 MG tablet Take 1 tablet (10 mg total) by mouth every 6 (six) hours as needed for nausea or vomiting. 30 tablet 2  . Tetrahydrozoline-Zn Sulfate (EYE DROPS ALLERGY RELIEF OP) Apply 1 drop to eye 2 (two) times daily as needed (Itching eyes). Reported on 03/31/2016    . tiotropium (SPIRIVA HANDIHALER) 18 MCG inhalation capsule Place 1 capsule (18 mcg total) into inhaler and inhale daily. 30 capsule 3  . zolpidem (AMBIEN) 10 MG tablet Take 1 tablet (10 mg total) by mouth at bedtime as needed for sleep. 30 tablet 0   No current  facility-administered medications for this visit.   Facility-Administered Medications Ordered in Other Visits  Medication Dose Route Frequency Provider Last Rate Last Dose  . sodium polystyrene (KAYEXALATE) 15 GM/60ML suspension 30 g  30 g Oral Once Baird Cancer,  PA-C        Review of Systems  Constitutional: Positive for malaise/fatigue. Negative for fever and chills.  HENT: Negative for congestion, hearing loss, nosebleeds, sore throat and tinnitus.        Hoarse voice.  Eyes: Negative.  Negative for blurred vision, double vision, pain and discharge.  Respiratory: Negative for hemoptysis, shortness of breath and wheezing.   Cardiovascular: Negative for chest pain, palpitations, claudication, leg swelling and PND.  Gastrointestinal: Positive for nausea and vomiting. Negative for heartburn, abdominal pain, diarrhea, constipation, blood in stool and melena.  Genitourinary: Negative.  Negative for dysuria, urgency, frequency and hematuria.  Musculoskeletal: Negative for myalgias, joint pain and falls.  Skin: Negative.  Negative for itching and rash.  Neurological: Positive for weakness. Negative for dizziness, tingling, tremors, sensory change, speech change, focal weakness, seizures, loss of consciousness and headaches.  Endo/Heme/Allergies: Negative.  Does not bruise/bleed easily.  Psychiatric/Behavioral: Negative for depression, suicidal ideas, memory loss and substance abuse. The patient is not nervous/anxious.   All other systems reviewed and are negative.  14 point ROS was done and is otherwise as detailed above or in HPI    PHYSICAL EXAMINATION: ECOG PERFORMANCE STATUS: 1 - Symptomatic but completely ambulatory   Vitals - 1 value per visit 03/09/6502  SYSTOLIC 546  DIASTOLIC 85  Pulse 96  Temperature 98  Respirations 20  Weight (lb) 127.6  Height   BMI 21.89   Physical Exam  Constitutional: She is oriented to person, place, and time and well-developed, well-nourished,  and in no distress.  Wears glasses.   HENT:  Head: Normocephalic and atraumatic.  Nose: Nose normal.  Mouth/Throat: No oropharyngeal exudate.  Eyes: Conjunctivae and EOM are normal. Pupils are equal, round, and reactive to light. Right eye exhibits no discharge. Left eye exhibits no discharge. No scleral icterus.  Neck: Normal range of motion. Neck supple. No tracheal deviation present. No thyromegaly present.  Neck mass still noted, smaller, significant XRT changes L neck greater than R  Cardiovascular: Normal rate, regular rhythm and normal heart sounds.  Exam reveals no gallop and no friction rub.   No murmur heard. Pulmonary/Chest: Effort normal and breath sounds normal. She has no wheezes. She has no rales.  Abdominal: Soft. Bowel sounds are normal. She exhibits no distension and no mass. There is no tenderness. There is no rebound and no guarding.  FT C/D/I  Musculoskeletal: Normal range of motion. She exhibits edema.  Minimal bilateral lower extremity edema.  Lymphadenopathy:    She has no cervical adenopathy.  Neurological: She is alert and oriented to person, place, and time. She has normal reflexes. No cranial nerve deficit. Gait normal. Coordination normal.  Skin: Skin is warm and dry. No rash noted.  Psychiatric: Mood, memory, affect and judgment normal.  Nursing note and vitals reviewed.   LABORATORY DATA:  I have reviewed the data as listed  Lab Results  Component Value Date   WBC 5.8 04/14/2016   HGB 8.7* 04/14/2016   HCT 26.0* 04/14/2016   MCV 97.4 04/14/2016   PLT 230 04/14/2016   CMP     Component Value Date/Time   NA 133* 04/14/2016 0818   K 4.2 04/14/2016 0818   CL 94* 04/14/2016 0818   CO2 30 04/14/2016 0818   GLUCOSE 111* 04/14/2016 0818   BUN 16 04/14/2016 0818   CREATININE 0.95 04/14/2016 0818   CALCIUM 9.2 04/14/2016 0818   PROT 6.3* 04/14/2016 0818   ALBUMIN 3.0* 04/14/2016 0818  AST 18 04/14/2016 0818   ALT 13* 04/14/2016 0818   ALKPHOS 92  04/14/2016 0818   BILITOT 0.5 04/14/2016 0818   GFRNONAA >60 04/14/2016 0818   GFRAA >60 04/14/2016 0818    RADIOGRAPHIC STUDIES: I have personally reviewed the radiological images as listed and agreed with the findings in the report. Study Result     CLINICAL DATA: Elevated creatinine.  EXAM: RENAL / URINARY TRACT ULTRASOUND COMPLETE  COMPARISON: PET-CT - 01/19/2016 ; CT the chest, abdomen pelvis - 01/03/2016  FINDINGS: Right Kidney:  Normal cortical thickness, echogenicity and size, measuring 10.9 cm in length. No focal renal lesions. No echogenic renal stones. No urinary obstruction.  Left Kidney:  Normal cortical thickness, echogenicity and size, measuring 11.2 cm in length. No focal renal lesions. No echogenic renal stones. No urinary obstruction.  Bladder:  Appears normal for degree of bladder distention.  IMPRESSION: No explanation for patient's elevated creatinine. Specifically, no evidence of urinary obstruction.   Electronically Signed  By: Sandi Mariscal M.D.  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 Benjamine Mola 12/21/2015 No obvious primary lesion noted on panendoscopy examination CT neck 12/17/2015 large infiltrative tumor mass extends to skin surface, infiltrates R patorid, R submandibular, R carotid sheath,Thrombosis of R IJ vein, multiple satellite nodules Centrilobular emphysema P16 negative Tobacco Use Tobacco Abuse Port a cath/FT placement on 01/17/2016 Multiple dental extractions with Dr. Enrique Sack 01/13/3016 Hyponatremia HTN NSTEMI   I spoke with Dr. Isidore Moos and we have opted to observe at this point. She would like to repeat imaging in about 2 months. If Laurelyn continues to loose weight I may recommend imaging sooner given the advanced nature of her disease at  presentation.   She has met with Ovid Curd of nutrition. We are referring her back to speech therapy. I have asked the patient to write her appointments in her calendar.   I will call her in a prescription for Prilosec to take every day to help her stomach and nausea.  I will see her 2 weeks after her appointment with Dr. Isidore Moos. We will take turns following her.  She knows that if something comes up and she keeps vomiting, she needs to let us know. I will bring her back in sooner for additional evaluation and work-up.   Orders Placed This Encounter  Procedures  . CBC with Differential    Standing Status: Future     Number of Occurrences: 1     Standing Expiration Date: 07/06/2016  . Comprehensive metabolic panel    Standing Status: Future     Number of Occurrences: 1     Standing Expiration Date: 07/06/2016  . CBC with Differential    Standing Status: Future     Number of Occurrences:      Standing Expiration Date: 04/14/2017  . Comprehensive metabolic panel    Standing Status: Future     Number of Occurrences:      Standing Expiration Date: 04/14/2017   All questions were answered. The patient knows to call the clinic with any problems, questions or concerns.  This document serves as a record of services personally performed by Ancil Linsey, MD. It was created on her behalf by Toni Amend, a trained medical scribe. The creation of this record is based on the scribe's personal observations and the provider's statements to them. This document has been  checked and approved by the attending provider.  I have reviewed the above documentation for accuracy and completeness, and I agree with the above.  This note was electronically signed.  Molli Hazard, MD  04/14/2016 9:19 AM

## 2016-04-14 NOTE — Progress Notes (Signed)
Following up with H&N patient secondary to severe weight loss  Contacted Pt by Visiting prior to her office visit   Wt Readings from Last 10 Encounters:  04/14/16 127 lb 9.6 oz (57.879 kg)  04/07/16 132 lb 12.8 oz (60.238 kg)  03/31/16 139 lb 1.6 oz (63.095 kg)  03/10/16 142 lb 3.2 oz (64.5 kg)  03/03/16 146 lb (66.225 kg)  03/02/16 143 lb 6.4 oz (65.046 kg)  02/28/16 143 lb 11.2 oz (65.182 kg)  02/22/16 145 lb (65.772 kg)  02/21/16 140 lb 6.4 oz (63.685 kg)  02/17/16 140 lb 6.4 oz (63.685 kg)   Pt was last seen by this RD on 4/18. A couple weeks after that visit, pt suffered NSTEMI and was subsequently admitted to [facility] for 1 week. She had still been able to maintain her weight until just these last couple weeks. She is now down 15 lbs in 1 month.  When her TF regimen was made, goal was 5 cans to meet 100% of needs, however she was able to eat throughout treatment, so 4 cans was acceptable. She was able to maintain weight with 4 cans and her PO intake   Pt says she has "constant, 24/7 nausea, from the time I wake up to when I go to sleep". She notes a couple occasions where she threw up TF.   Despite this, she reports her TF habits have not changed, she is still doing the 4 cans of osmolite 1.5 each day and reports "maybe one time" where she didn't get 4 cans in. She flushes w/ 120 mls before and after each can. She does a feeding roughly every four hrs starting at 8 am and last one at 8 pm.   TF regimen provides 1422 kcals, 60 g Pro, 724 ml fluid w/ additional 960 mls from flushes.   She is eating bits of food throughout the day. Foods eaten include ice cream, grits, scrambled eggs, soup. She notes no odynophagia. She reports that she has to be careful how she eats because she "gets choked". She has not followed up with SLP in a while. Directed her to do this and educated that SLP will teach her ways to compensate for dysphagia as well as exercises to help improve her swallow function. She  sounded unaware that this is what ST was for: "well i need to see her then". Encouraged continued PO intake  She denies any intolerance to the TF. Given her SEVERE rate of loss, it is difficult to believe her TF intake has not wavered in the least when she was maintaining on the same regimen for a prolonged period.  RD communicated that she needs to be very honest about whether or not she is having issues with her tube feeding. Advised that if she is, could change regimen to gravity drip or pump regimen. It is imperative that her TF intake is not inhibited in any way and if it is, need to change regimen.  She again states that the TF does NOT make her nausea worse. She gets full from one can, but does not cause discomfort. She says her urine is clear and not dark  Given what she reports, her TF needs to be increased to 5 cans daily. RD went over how this could be done-by either increasing each feed by 2 oz or adding another can daily and feeding every 3 hrs.   She thinks she will be able to do this without any difficulty. RD gave her another business  card with direction to contact him if, for whatever reason, she cannot do the 5 cans because something will then need to be altered.   Pt will address constant Nausea with MD, however, per pt report, this has no impact on her TF  Burtis Junes RD, LDN, [facility] Pager: [phone] 04/14/2016 9:07 AM

## 2016-04-14 NOTE — Patient Instructions (Signed)
[place] at [facility] Discharge Instructions  RECOMMENDATIONS MADE BY THE CONSULTANT AND ANY TEST RESULTS WILL BE SENT TO YOUR REFERRING PHYSICIAN.  Return to clinic mid July with labs  Prilosec 40mg  daily   Dr. Pearlie Oyster appt date/time is:  Refer to [facility] for speech therapy/swallow eval  Thank you for choosing [place] at [facility] to provide your oncology and hematology care.  To afford each patient quality time with our provider, please arrive at least 15 minutes before your scheduled appointment time.   Beginning January 23rd 2017 lab work for the [name] will be done in the  Main lab at [facility] on 1st floor. If you have a lab appointment with the [facility] please come in thru the  Main Entrance and check in at the main information desk  You need to re-schedule your appointment should you arrive 10 or more minutes late.  We strive to give you quality time with our providers, and arriving late affects you and other patients whose appointments are after yours.  Also, if you no show three or more times for appointments you may be dismissed from the clinic at the providers discretion.     Again, thank you for choosing [facility].  Our hope is that these requests will decrease the amount of time that you wait before being seen by our physicians.       _____________________________________________________________  Should you have questions after your visit to [facility], please contact our office at [phone] between the hours of 8:30 a.m. and 4:30 p.m.  Voicemails left after 4:30 p.m. will not be returned until the following business day.  For prescription refill requests, have your pharmacy contact our office.         Resources For Cancer Patients and their Caregivers ? American Cancer Society: Can assist with transportation, wigs, general needs, runs Look Good Feel Better.         803-620-1292 ? Cancer Care: Provides financial assistance, online support groups, medication/co-pay assistance.  1-800-813-HOPE 772-283-0638) ? [place] Assists [place] cancer patients and their families through emotional , educational and financial support.  440-488-9268 ? [place] DSS Where to apply for food stamps, Medicaid and utility assistance. 385-310-2273 ? [facility]: Transportation to medical appointments. 347 440 1773 ? Social Security Administration: May apply for disability if have a Stage IV cancer. (367)635-8525 (775)760-3847 ? [employer], Disability and Transit Services: Assists with nutrition, care and transit needs. [place] Support Programs: @10RELATIVEDAYS @ > Cancer Support Group  2nd Tuesday of the month 1pm-2pm, Journey Room  > Creative Journey  3rd Tuesday of the month 1130am-1pm, Journey Room  > Look Good Feel Better  1st Wednesday of the month 10am-12 noon, Journey Room (Call [place] to register [phone])

## 2016-04-18 ENCOUNTER — Encounter (HOSPITAL_COMMUNITY): Payer: Self-pay | Admitting: Hematology & Oncology

## 2016-04-19 ENCOUNTER — Ambulatory Visit (HOSPITAL_COMMUNITY): Payer: Medicaid Other | Attending: Hematology & Oncology | Admitting: Speech Pathology

## 2016-04-19 DIAGNOSIS — R1312 Dysphagia, oropharyngeal phase: Secondary | ICD-10-CM | POA: Insufficient documentation

## 2016-04-19 NOTE — Therapy (Signed)
[place], [place], [address] Phone: [phone]   Fax:  [phone]  Speech Language Pathology Evaluation  Patient Details  Name: Gina Frederick MRN: [id number] Date of Birth: Feb 25, 1956 No Data Recorded  Encounter Date: 04/19/2016    Past Medical History  Diagnosis Date  . Essential hypertension   . Arthritis   . Hyperlipidemia   . Squamous cell carcinoma (HCC)     Invasive - right neck, XRT and Cisplatin - Dr. Whitney Muse  . Edentulous     Multiple dental extractions March 2017  . Centrilobular emphysema ([place])   . NSTEMI (non-ST elevated myocardial infarction) [facility])     April 2017 - managed medically due to comorbid illnesses  . Ischemic cardiomyopathy     LVEF 40-45%    Past Surgical History  Procedure Laterality Date  . Tonsillectomy    . Knee arthroscopy Right   . Cesarean section    . Mass biopsy Right 12/21/2015    Procedure: NECK MASS BIOPSY;  Surgeon: Leta Baptist, MD;  Location: [facility];  Service: ENT;  Laterality: Right;  . Panendoscopy N/A 12/21/2015    Procedure: PANENDOSCOPY;  Surgeon: Leta Baptist, MD;  Location: [facility];  Service: ENT;  Laterality: N/A;  . Multiple extractions with alveoloplasty N/A 01/13/2016    Procedure: EXTRACTION OF TOOTH #'S 6-12,15, 19-28, 30 WITH ALVEOLOPLASTY AND BILATERAL MAXILLARY TUBEROSITY REDUCTIONS;  Surgeon: Lenn Cal, DDS;  Location: [facility];  Service: Oral Surgery;  Laterality: N/A;    There were no vitals filed for this visit.   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 She was treated with chemoradiation therapy, but was unable to complete it fully due to kidney function. She was referred to SLP by Dr. Whitney Muse as part of head and neck cancer protocol. Pt was orginally scheduled in March 2017, but was too ill to participate. She was admitted to [facility] 03/03/2016-03/10/2016  for NSTEMI, ARF, CHF, Afib with RVR, and respiratory failure with hypoxia.  Pt currently tolerates mechanical soft textures and thin liquids (ice cream, green beans, mashed and cream potato, baked fish, chocolate milk, sweet tea, and water). She is supplementing oral nutrition with PEG feedings and met with RD. Oral motor assessment revealed WFL lingual ROM and reduced lingual strength. Labial ROM was [facility] and strength was adequate. Velar ROM appeared [facility]. Vocal quality is hoarse/harsh.  SLP educated pt re: changes to swallowing musculature after rad tx, and why adherence to dysphagia HEP provided today and PO consumption was necessary to inhibit muscular disuse atrophy and to reduce muscle fibrosis following rad tx. Pt demonstrated understanding of this information to SLP. Further education was provided re: xerostomia, mucositis/esophagitis, and late effects head/neck radiation. SLP emphasized the importance of oral care and reflux precautions.    After evaluation tasks, SLP then developed a HEP for pt and pt was instructed how to perform exercises involving lingual, vocal, and pharyngeal strengthening. SLP performed each exercise and pt return demonstrated each exercise. SLP ensured pt performance was correct prior to moving on to next exercise. Pt was instructed to complete this program 3 times a day, 6-7 days/week until 60 days after their last rad tx, then x3 a week after that.   Will follow pt for the 3 allowed visits per Medicaid (will request).  SLP Short Term Goals - 04/19/16 1915    SLP SHORT TERM GOAL #  1   Title Pt will complete oral motor and pharyngeal strengthening exercises with min cues over 3 consecutive sessions with use of written prompt as needed   Baseline Not completing   Time 4   Period Months   Status New   SLP SHORT TERM GOAL #2   Title Pt will record items in food journal on 4+ days per week   Baseline Not using food journal now   Time 4    Period Months   Status New          SLP Long Term Goals - 04/19/16 1845    SLP LONG TERM GOAL #1   Title Pt will demonstrate safe and efficient consumption of self-regulated regular textures and unrestricted liquids with use of compensatory strategies as needed.   Baseline Showing positive signs of aspiration (coughing and wet vocal quality)   Time 4   Period Months   Status New          Patient will benefit from skilled therapeutic intervention in order to improve the following deficits and impairments:   Dysphagia, oropharyngeal phase    Problem List Patient Active Problem List   Diagnosis Date Noted  . Anemia in chronic kidney disease (CKD) 03/13/2016  . Acute heart failure ([place])   . ARF (acute renal failure) ([place])   . Chronic systolic CHF (congestive heart failure) ([place])   . Pulmonary hypertension ([place])   . Atrial fibrillation with RVR ([place])   . Essential hypertension   . Acute respiratory failure with hypoxia ([place])   . Chronic obstructive pulmonary disease ([place])   . Acute renal failure ([place])   . Squamous cell carcinoma of neck ([place])   . NSTEMI (non-ST elevated myocardial infarction) ([place]) 03/03/2016  . Cancer related pain 02/07/2016  . Insomnia due to medical condition 02/07/2016  . Absolute anemia 01/22/2016  . Port-a-cath in place 01/22/2016  . Pre-chemoradiation therapy dental protocol examination 01/07/2016  . Squamous cell carcinoma of head and neck ([place]) 12/28/2015  . Tobacco use 12/28/2015  . HTN (hypertension) 12/28/2015   Thank you,  Genene Churn, [place]  [facility] 04/19/2016, 10:37 PM  [place] [address] [place], [place], [address] Phone: [phone]   Fax:  [phone]  Name: Gina Frederick MRN: [id number] Date of Birth: 1956-10-27

## 2016-04-26 ENCOUNTER — Other Ambulatory Visit (HOSPITAL_COMMUNITY): Payer: Self-pay | Admitting: Hematology & Oncology

## 2016-05-01 ENCOUNTER — Other Ambulatory Visit (HOSPITAL_COMMUNITY): Payer: Self-pay | Admitting: Oncology

## 2016-05-01 DIAGNOSIS — E871 Hypo-osmolality and hyponatremia: Secondary | ICD-10-CM

## 2016-05-01 DIAGNOSIS — C760 Malignant neoplasm of head, face and neck: Secondary | ICD-10-CM

## 2016-05-01 MED ORDER — LORAZEPAM 0.5 MG PO TABS: 0.5000 mg | ORAL_TABLET | ORAL | Status: DC | PRN

## 2016-05-04 ENCOUNTER — Other Ambulatory Visit: Payer: Self-pay | Admitting: Radiation Oncology

## 2016-05-04 DIAGNOSIS — C770 Secondary and unspecified malignant neoplasm of lymph nodes of head, face and neck: Secondary | ICD-10-CM

## 2016-05-05 ENCOUNTER — Other Ambulatory Visit (HOSPITAL_COMMUNITY): Payer: Self-pay | Admitting: Oncology

## 2016-05-05 ENCOUNTER — Encounter (HOSPITAL_COMMUNITY): Payer: Medicaid Other

## 2016-05-05 DIAGNOSIS — C760 Malignant neoplasm of head, face and neck: Secondary | ICD-10-CM | POA: Diagnosis not present

## 2016-05-05 DIAGNOSIS — C4442 Squamous cell carcinoma of skin of scalp and neck: Secondary | ICD-10-CM

## 2016-05-05 DIAGNOSIS — E876 Hypokalemia: Secondary | ICD-10-CM

## 2016-05-05 DIAGNOSIS — Z95828 Presence of other vascular implants and grafts: Secondary | ICD-10-CM

## 2016-05-05 DIAGNOSIS — E871 Hypo-osmolality and hyponatremia: Secondary | ICD-10-CM

## 2016-05-05 LAB — CBC WITH DIFFERENTIAL/PLATELET
BASOS ABS: 0 10*3/uL (ref 0.0–0.1)
BASOS PCT: 0 %
EOS ABS: 0 10*3/uL (ref 0.0–0.7)
Eosinophils Relative: 1 %
HEMATOCRIT: 29.5 % — AB (ref 36.0–46.0)
HEMOGLOBIN: 9.6 g/dL — AB (ref 12.0–15.0)
Lymphocytes Relative: 6 %
Lymphs Abs: 0.3 10*3/uL — ABNORMAL LOW (ref 0.7–4.0)
MCH: 34.3 pg — ABNORMAL HIGH (ref 26.0–34.0)
MCHC: 32.5 g/dL (ref 30.0–36.0)
MCV: 105.4 fL — ABNORMAL HIGH (ref 78.0–100.0)
Monocytes Absolute: 0.6 10*3/uL (ref 0.1–1.0)
Monocytes Relative: 11 %
NEUTROS ABS: 4.1 10*3/uL (ref 1.7–7.7)
NEUTROS PCT: 82 %
Platelets: 214 10*3/uL (ref 150–400)
RBC: 2.8 MIL/uL — ABNORMAL LOW (ref 3.87–5.11)
RDW: 19.1 % — ABNORMAL HIGH (ref 11.5–15.5)
WBC: 5 10*3/uL (ref 4.0–10.5)

## 2016-05-05 LAB — COMPREHENSIVE METABOLIC PANEL
ALBUMIN: 3.3 g/dL — AB (ref 3.5–5.0)
ALK PHOS: 109 U/L (ref 38–126)
ALT: 25 U/L (ref 14–54)
ANION GAP: 8 (ref 5–15)
AST: 23 U/L (ref 15–41)
BILIRUBIN TOTAL: 0.6 mg/dL (ref 0.3–1.2)
BUN: 17 mg/dL (ref 6–20)
CALCIUM: 9.1 mg/dL (ref 8.9–10.3)
CO2: 35 mmol/L — ABNORMAL HIGH (ref 22–32)
CREATININE: 1.16 mg/dL — AB (ref 0.44–1.00)
Chloride: 86 mmol/L — ABNORMAL LOW (ref 101–111)
GFR calc Af Amer: 59 mL/min — ABNORMAL LOW (ref >60)
GFR calc non Af Amer: 50 mL/min — ABNORMAL LOW (ref >60)
GLUCOSE: 91 mg/dL (ref 65–99)
Potassium: 3 mmol/L — ABNORMAL LOW (ref 3.5–5.1)
Sodium: 129 mmol/L — ABNORMAL LOW (ref 135–145)
TOTAL PROTEIN: 6.2 g/dL — AB (ref 6.5–8.1)

## 2016-05-05 MED ORDER — POT BICARB-POT CHLORIDE 25 MEQ PO TBEF: 40.0000 meq | EFFERVESCENT_TABLET | Freq: Every day | ORAL | Status: DC

## 2016-05-10 ENCOUNTER — Other Ambulatory Visit (HOSPITAL_COMMUNITY): Payer: Self-pay | Admitting: Oncology

## 2016-05-11 ENCOUNTER — Other Ambulatory Visit (HOSPITAL_COMMUNITY): Payer: Self-pay | Admitting: *Deleted

## 2016-05-11 DIAGNOSIS — Z95828 Presence of other vascular implants and grafts: Secondary | ICD-10-CM

## 2016-05-11 DIAGNOSIS — C760 Malignant neoplasm of head, face and neck: Secondary | ICD-10-CM

## 2016-05-11 DIAGNOSIS — G4700 Insomnia, unspecified: Secondary | ICD-10-CM | POA: Insufficient documentation

## 2016-05-11 MED ORDER — ZOLPIDEM TARTRATE 10 MG PO TABS: 10.0000 mg | ORAL_TABLET | Freq: Every evening | ORAL | Status: DC | PRN

## 2016-05-19 ENCOUNTER — Encounter (HOSPITAL_COMMUNITY): Payer: Medicaid Other | Attending: Hematology & Oncology | Admitting: Hematology & Oncology

## 2016-05-19 ENCOUNTER — Encounter (HOSPITAL_COMMUNITY): Payer: Medicaid Other

## 2016-05-19 ENCOUNTER — Encounter: Payer: Self-pay | Admitting: Dietician

## 2016-05-19 VITALS — BP 142/77 | HR 74 | Temp 98.1°F | Resp 16 | Wt 124.9 lb

## 2016-05-19 DIAGNOSIS — C760 Malignant neoplasm of head, face and neck: Secondary | ICD-10-CM | POA: Insufficient documentation

## 2016-05-19 DIAGNOSIS — Z720 Tobacco use: Secondary | ICD-10-CM

## 2016-05-19 DIAGNOSIS — I10 Essential (primary) hypertension: Secondary | ICD-10-CM | POA: Diagnosis not present

## 2016-05-19 DIAGNOSIS — I252 Old myocardial infarction: Secondary | ICD-10-CM

## 2016-05-19 DIAGNOSIS — D7589 Other specified diseases of blood and blood-forming organs: Secondary | ICD-10-CM | POA: Diagnosis not present

## 2016-05-19 DIAGNOSIS — J432 Centrilobular emphysema: Secondary | ICD-10-CM | POA: Diagnosis not present

## 2016-05-19 DIAGNOSIS — D649 Anemia, unspecified: Secondary | ICD-10-CM | POA: Diagnosis present

## 2016-05-19 DIAGNOSIS — E871 Hypo-osmolality and hyponatremia: Secondary | ICD-10-CM | POA: Diagnosis not present

## 2016-05-19 LAB — COMPREHENSIVE METABOLIC PANEL
ALK PHOS: 94 U/L (ref 38–126)
ALT: 16 U/L (ref 14–54)
AST: 19 U/L (ref 15–41)
Albumin: 3.4 g/dL — ABNORMAL LOW (ref 3.5–5.0)
Anion gap: 6 (ref 5–15)
BUN: 19 mg/dL (ref 6–20)
CALCIUM: 9.5 mg/dL (ref 8.9–10.3)
CHLORIDE: 88 mmol/L — AB (ref 101–111)
CO2: 33 mmol/L — AB (ref 22–32)
CREATININE: 1 mg/dL (ref 0.44–1.00)
GFR calc Af Amer: >60 mL/min (ref >60)
GFR calc non Af Amer: 60 mL/min — ABNORMAL LOW (ref >60)
GLUCOSE: 115 mg/dL — AB (ref 65–99)
Potassium: 5.2 mmol/L — ABNORMAL HIGH (ref 3.5–5.1)
Sodium: 127 mmol/L — ABNORMAL LOW (ref 135–145)
Total Bilirubin: 0.5 mg/dL (ref 0.3–1.2)
Total Protein: 6.4 g/dL — ABNORMAL LOW (ref 6.5–8.1)

## 2016-05-19 LAB — CBC WITH DIFFERENTIAL/PLATELET
Basophils Absolute: 0 10*3/uL (ref 0.0–0.1)
Basophils Relative: 0 %
EOS PCT: 2 %
Eosinophils Absolute: 0.1 10*3/uL (ref 0.0–0.7)
HCT: 30.1 % — ABNORMAL LOW (ref 36.0–46.0)
HEMOGLOBIN: 9.9 g/dL — AB (ref 12.0–15.0)
LYMPHS ABS: 0.4 10*3/uL — AB (ref 0.7–4.0)
LYMPHS PCT: 8 %
MCH: 34.1 pg — AB (ref 26.0–34.0)
MCHC: 32.9 g/dL (ref 30.0–36.0)
MCV: 103.8 fL — AB (ref 78.0–100.0)
Monocytes Absolute: 0.4 10*3/uL (ref 0.1–1.0)
Monocytes Relative: 9 %
NEUTROS PCT: 81 %
Neutro Abs: 3.6 10*3/uL (ref 1.7–7.7)
Platelets: 265 10*3/uL (ref 150–400)
RBC: 2.9 MIL/uL — AB (ref 3.87–5.11)
RDW: 15.5 % (ref 11.5–15.5)
WBC: 4.5 10*3/uL (ref 4.0–10.5)

## 2016-05-19 NOTE — Progress Notes (Signed)
Following up with H&N patient. She relies on PEG for majority of nutrition  Contacted Pt by visiting during her office appointment  Wt Readings from Last 10 Encounters:  05/19/16 124 lb 14.4 oz (56.654 kg)  04/14/16 127 lb 9.6 oz (57.879 kg)  04/07/16 132 lb 12.8 oz (60.238 kg)  03/31/16 139 lb 1.6 oz (63.095 kg)  03/10/16 142 lb 3.2 oz (64.5 kg)  03/03/16 146 lb (66.225 kg)  03/02/16 143 lb 6.4 oz (65.046 kg)  02/28/16 143 lb 11.2 oz (65.182 kg)  02/22/16 145 lb (65.772 kg)  02/21/16 140 lb 6.4 oz (63.685 kg)   She has lost another 3 lbs in the last month.   Patient was very surprised she has lost weight. "I have been eating like a pig". "I dont feel any different". RD explained other potential reasons for weight discrepancy.   Despite her previous statement, pt is eating only "1 good meal" a day. She notes she snacks throughout the day on ice cream and items such as cereal.   She states "I miss my teeth". She has not yet received dentures, however this sounds not to impact her intake all that much; she is still able to eat meats.   She reports that she still is infusing 4 cans of Osmolite 1.5 each day. TF regimen provides 1422 kcals, 60 g Pro, 724 ml   As mentioned last encounter, it is difficult to believe she is fully compliant with TF regimen given her continued weight loss. PO wise, one good meal is assumed to be 400-500 kcals and atleast another 300-400 kcals from her "constant snacking". Question that if in reality her "constant snacks" and "1 good meal" are nothing more than a couple bites.   Pt is still missing out on opportunity for extra kcals. She is using 2% milk with her cereal and reports eating vegetables. She is instructed to switch to whole milk and only eat the vegetable if they are covered with cheese or other high Kcal sauce/condiment. "I can put cheese on anything". She is instructed to do just that.   She is not drinking Ensure/Boost or any other supplement. She  insinuates cost is a inhibitor. She is given multiple coupons and bag of samples today.   RD did talk about potential changes to TF such as switching formulas or putting her on pump, though this is moving in the opposite direction as she is able to eat by mouth. She was not open to the pump.   Pt herself sounded to be doing fairly well. She noted that her Rad MD mentioned potentially surgery "to make sure there is nothing left in there (points to original mass)". If this is the case, her TF regimen will definitely need to be altered.   Left my contact info, coupons, samples, and handouts titled "Soft and Moist High Protein Menu [employer]"  Burtis Junes RD, LDN, [facility] Pager: [phone] 05/19/2016 11:17 AM

## 2016-05-19 NOTE — Progress Notes (Signed)
[facility] NOTE  Patient Care Team: Noel Journey. 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 Benjamine Mola 12/21/2015 No obvious primary lesion noted on panendoscopy examination CT neck 12/17/2015 large infiltrative tumor mass extends to skin surface, infiltrates R patorid, R submandibular, R carotid sheath,Thrombosis of R IJ vein, multiple satellite nodules Centrilobular emphysema P16 negative Tobacco Abuse Port a cath/FT placement on 01/17/2016 Multiple dental extractions with Dr. Enrique Sack 01/13/3016    Squamous cell carcinoma of head and neck ([place])   12/17/2015 Imaging CT neck- large infiltrative tumor mass extends to skin surface, infiltrates R patorid, R submandibular, R carotid sheath,Thrombosis of R IJ vein, multiple satellite nodules   12/21/2015 Procedure Soft tissue, biopsy, right neck mass by Dr. Benjamine Mola  Direct laryngoscopy, rigid esophagoscopy, and bronchoscopy    12/21/2015 Pathology Results - SQUAMOUS CELL CARCINOMA.  The carcinoma appears moderately to poorly differentiated.  P16 NEGATIVE.   12/28/2015 Initial Diagnosis Squamous cell carcinoma of head and neck ([place])   01/03/2016 Imaging CT CAP- Large right-sided neck mass. No evidence for lymphadenopathy or metastatic disease in the chest, abdomen, or pelvis. 9 mm ground-glass nodule in the posterior right lung apex is probably related to scarring given the underlying emphysema   01/04/2016 Imaging Bone scan- No evidence of metastatic disease.   01/17/2016 Procedure IR port and feeding tube placed   01/19/2016 PET scan The dominant right neck mass is hypermetabolic. In addition, there are hypermetabolic smaller cervical lymph nodes  bilaterally.  No discrete lesion of the pharyngeal mucosal space identified. The dominant right neck mass and its hypermetabolic activi...   01/31/2016 -  Chemotherapy Cisplatin   02/01/2016 - 03/28/2016 Radiation Therapy 70 Gy by Dr. Isidore Moos.   03/03/2016 - 03/10/2016 Hospital Admission NSTEMI, ARF, CHF, Afib with RVR, respiratory failure with hypoxia    HISTORY OF PRESENTING ILLNESS:  Gina Frederick 60 y.o. 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   Gina Frederick returns to the [facility] today accompanied by a female friend.  She confirms that she's eating whatever she can get down and isn't sure why she's losing weight. She uses her feeding tube every day, getting 4 cans in a day. On a normal day, she notes that she's able to eat plenty by mouth, including eggs, soups, stews; made a pot of vegetable stew the other day that was good. She can eat fish, chicken. She notes "it seems like my stomach gets filled up faster. I don't know if it's because of the other stuff I'm pumping in there or what." She says she just doesn't feel hungry. She says she eats "a lot" of ice cream, and is now drinking whole milk instead of 2%. She notes she usually eats 2 eggs whenever she eats eggs, and has about a half a cup of ice cream when she eats that; "eating too much ice cream gives me a headache." She says "I've never been a big ice cream eater."   Her friend notes 'she doesn't ever sit down; she's busy all day."  She comments that her throat feels okay; "it feels all right, it doesn't hurt or anything." But she does go back to get a PET scan on August  14th, and the idea of surgery was brought up.  She confirms that her breathing is fine. She says she smokes 1 a day "because it's there and I have to have it." She says she buys them and puts them there, and they calm her nerves.  Her mouth isn't dry, and she confirms that she's been using Biotene to help with  this.  She confirms that she's going to see Dr. Isidore Moos not long after her PET scan on the 14th, and knows to ask Dr. Isidore Moos about her thoughts on the denture work. Her gums will need to settle before she has dentures fitted.  She has lymphedema collection under her chin from the radiation. She has an upcoming appointment with [facility].  She denies CP or SOB. She is following with cardiology.    MEDICAL HISTORY:  Past Medical History  Diagnosis Date  . Essential hypertension   . Arthritis   . Hyperlipidemia   . Squamous cell carcinoma (HCC)     Invasive - right neck, XRT and Cisplatin - Dr. Whitney Muse  . Edentulous     Multiple dental extractions March 2017  . Centrilobular emphysema ([place])   . NSTEMI (non-ST elevated myocardial infarction) [facility])     April 2017 - managed medically due to comorbid illnesses  . Ischemic cardiomyopathy     LVEF 40-45%    SURGICAL HISTORY: Past Surgical History  Procedure Laterality Date  . Tonsillectomy    . Knee arthroscopy Right   . Cesarean section    . Mass biopsy Right 12/21/2015    Procedure: NECK MASS BIOPSY;  Surgeon: Leta Baptist, MD;  Location: [facility];  Service: ENT;  Laterality: Right;  . Panendoscopy N/A 12/21/2015    Procedure: PANENDOSCOPY;  Surgeon: Leta Baptist, MD;  Location: [facility];  Service: ENT;  Laterality: N/A;  . Multiple extractions with alveoloplasty N/A 01/13/2016    Procedure: EXTRACTION OF TOOTH #'S 6-12,15, 19-28, 30 WITH ALVEOLOPLASTY AND BILATERAL MAXILLARY TUBEROSITY REDUCTIONS;  Surgeon: Lenn Cal, DDS;  Location: [facility];  Service: Oral Surgery;  Laterality: N/A;    SOCIAL HISTORY: Social History   Social History  . Marital Status: Divorced    Spouse Name: N/A  . Number of Children: 1  . Years of Education: N/A   Occupational History  . Not on file.   Social History Main Topics  . Smoking status: Former Smoker -- 0.25 packs/day for 20 years    Types: Cigarettes  . Smokeless  tobacco: Never Used     Comment: She took a puff two days ago  . Alcohol Use: No  . Drug Use: No  . Sexual Activity: No   Other Topics Concern  . Not on file   Social History Narrative   Lives with a lady she takes care of.    She is divorced. One daughter, no grandchildren Smokes, notes that she is down to 3 a day. Began smoking "probably in her 78's." She says she has quit "on and off through the years." Denies problems with EtOH. 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 Quit working when her daughter was "of age to go to school," then stayed home with her daughter. For hobbies, she says she likes to read.  Is currently the caregiver for a lady with diabetes.  She does light housekeeping and keeps her on her medications, and cooks. The son of the woman she  cares for, Herbie Baltimore, notes that her good cooking has brought his momma's blood pressure and blood sugar down.  She notes she has a good support system with her sister, and with the son of the woman she takes care of.  FAMILY HISTORY: Family History  Problem Relation Age of Onset  . Alzheimer's disease Mother   . Congestive Heart Failure Father 35   indicated that her mother is deceased. She indicated that her father is deceased.   Mother and father are deceased. Mother was 101; died of Alzheimer's. Father was 52; congestive heart failure. Has a sister, age 72 this year. Had a brother, deceased. He took his own life. 4 nieces and nephews.  ALLERGIES:  is allergic to codeine.  MEDICATIONS:  Current Outpatient Prescriptions  Medication Sig Dispense Refill  . acetaminophen (TYLENOL) 500 MG tablet Take 1,000 mg by mouth every 6 (six) hours as needed for mild pain or moderate pain.     Marland Kitchen apixaban (ELIQUIS) 5 MG TABS tablet Take 1 tablet (5 mg total) by mouth 2 (two) times daily. 60 tablet 3  . atorvastatin (LIPITOR) 40 MG tablet Take 1 tablet (40  mg total) by mouth daily at 6 PM. 30 tablet 3  . benzonatate (TESSALON) 100 MG capsule Take 1 capsule (100 mg total) by mouth 3 (three) times daily as needed for cough. 20 capsule 0  . diphenhydrAMINE (BENADRYL) 25 MG tablet Take 25 mg by mouth every 6 (six) hours as needed. Reported on 03/13/2016    . feeding supplement, ENSURE ENLIVE, (ENSURE ENLIVE) LIQD Take 237 mLs by mouth 2 (two) times daily between meals. 237 mL 0  . fluconazole (DIFLUCAN) 100 MG tablet Take 100 mg by mouth daily. Reported on 03/31/2016    . lidocaine-prilocaine (EMLA) cream Apply a quarter size amount to port site 1 hour prior to chemo. Do not rub in. Cover with plastic wrap. 30 g 3  . LORazepam (ATIVAN) 0.5 MG tablet Take 1 tablet (0.5 mg total) by mouth every 4 (four) hours as needed for anxiety (one tab every 4-6 hours as needed for nausea and vomiting). 45 tablet 1  . magnesium oxide (MAG-OX) 400 (241.3 Mg) MG tablet Take 1 tablet (400 mg total) by mouth 2 (two) times daily. 60 tablet 3  . metoprolol (LOPRESSOR) 100 MG tablet Take 1 tablet (100 mg total) by mouth 2 (two) times daily. 60 tablet 3  . nitroGLYCERIN (NITROSTAT) 0.4 MG SL tablet Place 1 tablet (0.4 mg total) under the tongue every 5 (five) minutes x 3 doses as needed for chest pain. 15 tablet 0  . omeprazole (PRILOSEC) 40 MG capsule Take 1 capsule (40 mg total) by mouth daily. 30 capsule 3  . ondansetron (ZOFRAN) 8 MG tablet TAKE 1 TABLET EVERY 8 HOURS AS NEEDED FOR NAUSEA AND VOMITING 30 tablet 6  . POT BICARB-POT CHLORIDE,25MEQ, 25 MEQ TBEF Take 1.6 tablets (40 mEq total) by mouth daily. 30 tablet 2  . prochlorperazine (COMPAZINE) 10 MG tablet Take 1 tablet (10 mg total) by mouth every 6 (six) hours as needed for nausea or vomiting. 30 tablet 2  . zolpidem (AMBIEN) 10 MG tablet Take 1 tablet (10 mg total) by mouth at bedtime as needed for sleep. 30 tablet 2   No current facility-administered medications for this visit.   Facility-Administered Medications  Ordered in Other Visits  Medication Dose Route Frequency Provider Last Rate Last Dose  . sodium polystyrene (KAYEXALATE) 15 GM/60ML suspension 30 g  30 g Oral  Once [facility] Cancer, PA-C        Review of Systems  Constitutional: Positive for malaise/fatigue and weight loss. Negative for chills and fever.  HENT: Negative for congestion, hearing loss, nosebleeds, sore throat and tinnitus.        Hoarse voice.  Eyes: Negative.  Negative for blurred vision, double vision, pain and discharge.  Respiratory: Negative for hemoptysis, shortness of breath and wheezing.   Cardiovascular: Negative for chest pain, palpitations, claudication, leg swelling and PND.  Gastrointestinal: Negative for abdominal pain, blood in stool, constipation, diarrhea, heartburn, melena, nausea and vomiting.  Genitourinary: Negative.  Negative for dysuria, urgency, frequency and hematuria.  Musculoskeletal: Negative for myalgias, joint pain and falls.  Skin: Negative.  Negative for itching and rash.  Neurological: Positive for weakness. Negative for dizziness, tingling, tremors, sensory change, speech change, focal weakness, seizures, loss of consciousness and headaches.  Endo/Heme/Allergies: Negative.  Does not bruise/bleed easily.  Psychiatric/Behavioral: Negative for depression, suicidal ideas, memory loss and substance abuse. The patient is not nervous/anxious.   All other systems reviewed and are negative.  14 point ROS was done and is otherwise as detailed above or in HPI    PHYSICAL EXAMINATION: ECOG PERFORMANCE STATUS: 1 - Symptomatic but completely ambulatory  Vitals - 1 value per visit [id number]  SYSTOLIC [id number]  DIASTOLIC 77  Pulse 74  Temperature 98.1  Respirations 16  Weight (lb) 124.9  Height   BMI 21.44    Vitals - 1 value per visit 05/19/2016 04/14/2016 04/07/2016 03/31/2016 03/13/2016  Weight (lb) 124.9 127.6 132.8 139.1    Vitals - 1 value per visit 03/10/2016  Weight (lb) 142.2   Physical Exam    Constitutional: She is oriented to person, place, and time and well-developed, well-nourished, and in no distress.  Wears glasses.   HENT:  Head: Normocephalic and atraumatic.  Nose: Nose normal.  Mouth/Throat: No oropharyngeal exudate.  She has mild lymphedema under her chin   Eyes: Conjunctivae and EOM are normal. Pupils are equal, round, and reactive to light. Right eye exhibits no discharge. Left eye exhibits no discharge. No scleral icterus.  Neck: Normal range of motion. Neck supple. No tracheal deviation present. No thyromegaly present.  Neck mass still noted, smaller, R neck firm, radiation induced skin changes, overall well healed  Cardiovascular: Normal rate, regular rhythm and normal heart sounds.  Exam reveals no gallop and no friction rub.   No murmur heard. Pulmonary/Chest: Effort normal and breath sounds normal. She has no wheezes. She has no rales.  Abdominal: Soft. Bowel sounds are normal. She exhibits no distension and no mass. There is no tenderness. There is no rebound and no guarding.  FT C/D/I  Musculoskeletal: Normal range of motion. She exhibits edema.  Minimal bilateral lower extremity edema.  Lymphadenopathy:    She has no cervical adenopathy.  Neurological: She is alert and oriented to person, place, and time. She has normal reflexes. No cranial nerve deficit. Gait normal. Coordination normal.  Skin: Skin is warm and dry. No rash noted.  Psychiatric: Mood, memory, affect and judgment normal.  Nursing note and vitals reviewed.   LABORATORY DATA:  I have reviewed the data as listed  Lab Results  Component Value Date   WBC 4.5 05/19/2016   HGB 9.9* 05/19/2016   HCT 30.1* 05/19/2016   MCV 103.8* 05/19/2016   PLT 265 05/19/2016   CMP     Component Value Date/Time   NA 127* 05/19/2016 0953   K 5.2* 05/19/2016 [id number]  CL 88* 05/19/2016 0953   CO2 33* 05/19/2016 0953   GLUCOSE 115* 05/19/2016 0953   BUN 19 05/19/2016 0953   CREATININE 1.00 05/19/2016  0953   CALCIUM 9.5 05/19/2016 0953   PROT 6.4* 05/19/2016 0953   ALBUMIN 3.4* 05/19/2016 0953   AST 19 05/19/2016 0953   ALT 16 05/19/2016 0953   ALKPHOS 94 05/19/2016 0953   BILITOT 0.5 05/19/2016 0953   GFRNONAA 60* 05/19/2016 0953   GFRAA >60 05/19/2016 0953     RADIOGRAPHIC STUDIES: I have personally reviewed the radiological images as listed and agreed with the findings in the report. Study Result     CLINICAL DATA: Elevated creatinine.  EXAM: RENAL / URINARY TRACT ULTRASOUND COMPLETE  COMPARISON: PET-CT - 01/19/2016 ; CT the chest, abdomen pelvis - 01/03/2016  FINDINGS: Right Kidney:  Normal cortical thickness, echogenicity and size, measuring 10.9 cm in length. No focal renal lesions. No echogenic renal stones. No urinary obstruction.  Left Kidney:  Normal cortical thickness, echogenicity and size, measuring 11.2 cm in length. No focal renal lesions. No echogenic renal stones. No urinary obstruction.  Bladder:  Appears normal for degree of bladder distention.  IMPRESSION: No explanation for patient's elevated creatinine. Specifically, no evidence of urinary obstruction.   Electronically Signed  By: Sandi Mariscal M.D.  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 Benjamine Mola 12/21/2015 No obvious primary lesion noted on panendoscopy examination CT neck 12/17/2015 large infiltrative tumor mass extends to skin surface, infiltrates R patorid, R submandibular, R carotid sheath,Thrombosis of R IJ vein, multiple satellite nodules Centrilobular emphysema P16 negative Tobacco Use Tobacco Abuse Port a cath/FT placement on 01/17/2016 Multiple dental extractions with Dr. Enrique Sack 01/13/3016 Hyponatremia HTN NSTEMI Macrocytosis, normal B12, folate   I spoke with Dr. Isidore Moos and we  have opted to observe at this point. Allene has upcoming imaging in August. Her weight loss is concerning for more advanced disease.   She was encouraged to discontinue all smoking. I have encouraged her to continue to follow with cardiology and speech therapy.  She did not need any refills today.    Labs will continue to be monitored. Hyponatremia has been a long-standing problem, initially thought to be medication related. It has not been extensively evaluated. We can pursue further workup at follow-up. She is asymptomatic  She will return post PET for additional recommendations.  Plan I believe at this point is that if she has residual disease in the neck only for surgical opinion.  All questions were answered. The patient knows to call the clinic with any problems, questions or concerns.  This document serves as a record of services personally performed by Ancil Linsey, MD. It was created on her behalf by Toni Amend, a trained medical scribe. The creation of this record is based on the scribe's personal observations and the provider's statements to them. This document has been checked and approved by the attending provider.  I have reviewed the above documentation for accuracy and completeness, and I agree with the above.  This note was electronically signed.  Molli Hazard, MD  05/19/2016 10:55 AM

## 2016-05-19 NOTE — Patient Instructions (Signed)
[place] at [facility] Discharge Instructions  RECOMMENDATIONS MADE BY THE CONSULTANT AND ANY TEST RESULTS WILL BE SENT TO YOUR REFERRING PHYSICIAN.  You saw Dr. Whitney Muse today.  Return to clinic post PET scan on 8/14.  Thank you for choosing [place] at [facility] to provide your oncology and hematology care.  To afford each patient quality time with our provider, please arrive at least 15 minutes before your scheduled appointment time.   Beginning January 23rd 2017 lab work for the [name] will be done in the  Main lab at [facility] on 1st floor. If you have a lab appointment with the [facility] please come in thru the  Main Entrance and check in at the main information desk  You need to re-schedule your appointment should you arrive 10 or more minutes late.  We strive to give you quality time with our providers, and arriving late affects you and other patients whose appointments are after yours.  Also, if you no show three or more times for appointments you may be dismissed from the clinic at the providers discretion.     Again, thank you for choosing [facility].  Our hope is that these requests will decrease the amount of time that you wait before being seen by our physicians.       _____________________________________________________________  Should you have questions after your visit to [facility], please contact our office at [phone] between the hours of 8:30 a.m. and 4:30 p.m.  Voicemails left after 4:30 p.m. will not be returned until the following business day.  For prescription refill requests, have your pharmacy contact our office.         Resources For Cancer Patients and their Caregivers ? American Cancer Society: Can assist with transportation, wigs, general needs, runs Look Good Feel Better.        (726)053-4734 ? Cancer Care: Provides financial assistance, online support  groups, medication/co-pay assistance.  1-800-813-HOPE 405-180-2786) ? [facility] Assists [place] cancer patients and their families through emotional , educational and financial support.  (801)163-0349 ? [place] DSS Where to apply for food stamps, Medicaid and utility assistance. 814 646 1073 ? [facility]: Transportation to medical appointments. 914 329 3309 ? Social Security Administration: May apply for disability if have a Stage IV cancer. 207-217-9603 954-337-7086 ? [employer], Disability and Transit Services: Assists with nutrition, care and transit needs. [place] Support Programs: @10RELATIVEDAYS @ > Cancer Support Group  2nd Tuesday of the month 1pm-2pm, Journey Room  > Creative Journey  3rd Tuesday of the month 1130am-1pm, Journey Room  > Look Good Feel Better  1st Wednesday of the month 10am-12 noon, Journey Room (Call [place] to register [phone])

## 2016-05-24 ENCOUNTER — Ambulatory Visit (HOSPITAL_COMMUNITY): Payer: Medicaid Other | Attending: Hematology & Oncology | Admitting: Speech Pathology

## 2016-05-25 ENCOUNTER — Encounter (HOSPITAL_COMMUNITY): Payer: Self-pay | Admitting: Speech Pathology

## 2016-05-26 ENCOUNTER — Encounter: Payer: Self-pay | Admitting: Cardiology

## 2016-05-26 ENCOUNTER — Ambulatory Visit (INDEPENDENT_AMBULATORY_CARE_PROVIDER_SITE_OTHER): Payer: Medicaid Other | Admitting: Cardiology

## 2016-05-26 VITALS — BP 126/81 | HR 82 | Ht 66.0 in | Wt 122.0 lb

## 2016-05-26 DIAGNOSIS — E782 Mixed hyperlipidemia: Secondary | ICD-10-CM | POA: Diagnosis not present

## 2016-05-26 DIAGNOSIS — I480 Paroxysmal atrial fibrillation: Secondary | ICD-10-CM

## 2016-05-26 DIAGNOSIS — I10 Essential (primary) hypertension: Secondary | ICD-10-CM | POA: Diagnosis not present

## 2016-05-26 DIAGNOSIS — I255 Ischemic cardiomyopathy: Secondary | ICD-10-CM | POA: Diagnosis not present

## 2016-05-26 NOTE — Patient Instructions (Signed)
Your physician recommends that you schedule a follow-up appointment in: 3 months     STOP Aspirin    Your physician has requested that you have an echocardiogram. Echocardiography is a painless test that uses sound waves to create images of your heart. It provides your doctor with information about the size and shape of your heart and how well your heart's chambers and valves are working. This procedure takes approximately one hour. There are no restrictions for this procedure.     Thank you for choosing [facility] !

## 2016-05-26 NOTE — Progress Notes (Signed)
Cardiology Office Note  Date: 05/26/2016   ID: Gina Frederick, DOB 05-23-1956, MRN [id number]  PCP: Raiford Simmonds., PA-C  Primary Cardiologist: Rozann Lesches, MD   Chief Complaint  Patient presents with  . Cardiomyopathy  . Ischemic heart disease    History of Present Illness: Gina Frederick is a medically complex 60 y.o. female seen by me for the first time in June.She presents with a friend for follow-up. From a cardiac perspective, she does not report any chest pain or nitroglycerin use. Also no palpitations. States that she has NYHA class II dyspnea with typical activities.  She continues to follow in the Oncology clinic with Dr. Whitney Muse, recently seen on July 14. 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 He has a PEG tube for supplemental nutrition but is able to swallow small amounts to some degree. It sounds she will be undergoing follow-up imaging in the next month to see if any further treatments are needed.  I reviewed her medications. I recommended that she stop aspirin since she is already on Eliquis. Remaining cardiac regimen includes Lipitor, Lopressor, and as needed nitroglycerin.  We discussed updating her echocardiogram to reassess LVEF, last study was in April. She is not undergoing any active chemotherapy at this time.  Past Medical History  Diagnosis Date  . Essential hypertension   . Arthritis   . Hyperlipidemia   . Squamous cell carcinoma (HCC)     Invasive - right neck, XRT and Cisplatin - Dr. Whitney Muse  . Edentulous     Multiple dental extractions March 2017  . Centrilobular emphysema ([place])   . NSTEMI (non-ST elevated myocardial infarction) [facility])     April 2017 - managed medically due to comorbid illnesses  . Ischemic cardiomyopathy     LVEF 40-45%  . PAF (paroxysmal atrial fibrillation) [facility])     Past Surgical History  Procedure Laterality Date  . Tonsillectomy    .  Knee arthroscopy Right   . Cesarean section    . Mass biopsy Right 12/21/2015    Procedure: NECK MASS BIOPSY;  Surgeon: Leta Baptist, MD;  Location: [place];  Service: ENT;  Laterality: Right;  . Panendoscopy N/A 12/21/2015    Procedure: PANENDOSCOPY;  Surgeon: Leta Baptist, MD;  Location: [place];  Service: ENT;  Laterality: N/A;  . Multiple extractions with alveoloplasty N/A 01/13/2016    Procedure: EXTRACTION OF TOOTH #'S 6-12,15, 19-28, 30 WITH ALVEOLOPLASTY AND BILATERAL MAXILLARY TUBEROSITY REDUCTIONS;  Surgeon: Lenn Cal, DDS;  Location[facility];  Service: Oral Surgery;  Laterality: N/A;    Current Outpatient Prescriptions  Medication Sig Dispense Refill  . acetaminophen (TYLENOL) 500 MG tablet Take 1,000 mg by mouth every 6 (six) hours as needed for mild pain or moderate pain.     Marland Kitchen apixaban (ELIQUIS) 5 MG TABS tablet Take 1 tablet (5 mg total) by mouth 2 (two) times daily. 60 tablet 3  . atorvastatin (LIPITOR) 40 MG tablet Take 1 tablet (40 mg total) by mouth daily at 6 PM. 30 tablet 3  . benzonatate (TESSALON) 100 MG capsule Take 1 capsule (100 mg total) by mouth 3 (three) times daily as needed for cough. 20 capsule 0  . diphenhydrAMINE (BENADRYL) 25 MG tablet Take 25 mg by mouth every 6 (six) hours as needed. Reported on 03/13/2016    . feeding supplement, ENSURE ENLIVE, (ENSURE ENLIVE) LIQD Take 237 mLs by mouth 2 (two)  times daily between meals. 237 mL 0  . fluconazole (DIFLUCAN) 100 MG tablet Take 100 mg by mouth daily. Reported on 03/31/2016    . lidocaine-prilocaine (EMLA) cream Apply a quarter size amount to port site 1 hour prior to chemo. Do not rub in. Cover with plastic wrap. 30 g 3  . LORazepam (ATIVAN) 0.5 MG tablet Take 1 tablet (0.5 mg total) by mouth every 4 (four) hours as needed for anxiety (one tab every 4-6 hours as needed for nausea and vomiting). 45 tablet 1  . metoprolol (LOPRESSOR) 100 MG tablet Take 1 tablet (100 mg total) by mouth 2  (two) times daily. 60 tablet 3  . nitroGLYCERIN (NITROSTAT) 0.4 MG SL tablet Place 1 tablet (0.4 mg total) under the tongue every 5 (five) minutes x 3 doses as needed for chest pain. 15 tablet 0  . POT BICARB-POT CHLORIDE,25MEQ, 25 MEQ TBEF Take 1.6 tablets (40 mEq total) by mouth daily. 30 tablet 2  . prochlorperazine (COMPAZINE) 10 MG tablet Take 1 tablet (10 mg total) by mouth every 6 (six) hours as needed for nausea or vomiting. 30 tablet 2  . zolpidem (AMBIEN) 10 MG tablet Take 1 tablet (10 mg total) by mouth at bedtime as needed for sleep. 30 tablet 2   No current facility-administered medications for this visit.   Facility-Administered Medications Ordered in Other Visits  Medication Dose Route Frequency Provider Last Rate Last Dose  . sodium polystyrene (KAYEXALATE) 15 GM/60ML suspension 30 g  30 g Oral Once B[place] Cancer, PA-C       Allergies:  Codeine   Social History: The patient  reports that she has quit smoking. Her smoking use included Cigarettes. She has a 5 pack-year smoking history. She has never used smokeless tobacco. She reports that she does not drink alcohol or use illicit drugs.   ROS:  Please see the history of present illness. Otherwise, complete review of systems is positive for dysphagia.  All other systems are reviewed and negative.   Physical Exam: VS:  BP 126/81 mmHg  Pulse 82  Ht 5\' 6"  (1.676 m)  Wt 122 lb (55.339 kg)  BMI 19.70 kg/m2  SpO2 99%, BMI Body mass index is 19.7 kg/(m^2).  Wt Readings from Last 3 Encounters:  05/26/16 122 lb (55.339 kg)  05/19/16 124 lb 14.4 oz (56.654 kg)  04/14/16 127 lb 9.6 oz (57.879 kg)    General: Chronically ill-appearing woman in no distress, hoarse with talking. HEENT: Conjunctiva and lids normal, mass right side of neck. Neck: Supple, obvious elevated JVP or carotid bruits. Lungs: Decreased breath sounds without wheezing, nonlabored breathing at rest. Cardiac: Regular rate and rhythm, no S3, soft systolic  murmur, no pericardial rub. Abdomen: Soft, nontender, bowel sounds present, no guarding or rebound. Extremities: 2+ lower leg edema, distal pulses 1-2+. Skin: Warm and dry. Musculoskeletal: No kyphosis. Neuropsychiatric: Alert and oriented x3, affect grossly appropriate.  ECG: I personally reviewed the tracing from 03/06/2016 which showed sinus tachycardia with nonspecific ST-T changes.  Recent Labwork: 01/21/2016: TSH 4.179 03/08/2016: B Natriuretic Peptide >4500.0* 04/14/2016: Magnesium 1.9 05/19/2016: ALT 16; AST 19; BUN 19; Creatinine, Ser 1.00; Hemoglobin 9.9*; Platelets 265; Potassium 5.2*; Sodium 127*   Other Studies Reviewed Today:  Echocardiogram 03/04/2016: Study Conclusions  - Left ventricle: The cavity size was normal. Systolic function was  mildly to moderately reduced. The estimated ejection fraction was  in the range of 40% to 45%. Severe hypokinesis of the inferior  myocardium. - Left atrium: The  atrium was mildly to moderately dilated. - Right atrium: The atrium was mildly dilated. - Tricuspid valve: There was moderate regurgitation. - Pulmonary arteries: Systolic pressure was moderately increased.  PA peak pressure: 48 mm Hg (S).  Assessment and Plan:  1. History of medically managed NSTEMI in April of this year in the setting of comorbid illness. She has an associated ischemic cardiomyopathy with LVEF 40-45%. Doing well symptomatically at this point. Plan to continue medical therapy, drop aspirin since she is on Eliquis for PAF, and follow-up echocardiogram to reassess LVEF.  2. Essential hypertension, blood pressure is well controlled today.  3. Hyperlipidemia, continues on Lipitor.  4. Paroxysmal atrial fibrillation noted during prior hospitalization. She is tolerating Eliquis. No palpitations.  5. 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 Whitney Muse. If she needs to undergo extensive ENT surgery at some point, we would at least  want to consider a Myoview study to assess overall ischemic burden as it relates to perioperative risk.  Current medicines were reviewed with the patient today.   Orders Placed This Encounter  Procedures  . ECHOCARDIOGRAM COMPLETE    Disposition: Follow-up with me in 3 months.  Signed, Satira Sark, MD, [facility] 05/26/2016 1:37 PM    [facility] at [facility] [address], [place], [place] [address] Phone: [phone]; Fax: [phone]

## 2016-05-29 ENCOUNTER — Ambulatory Visit (HOSPITAL_COMMUNITY): Payer: Self-pay | Admitting: Hematology & Oncology

## 2016-06-01 ENCOUNTER — Ambulatory Visit (HOSPITAL_COMMUNITY)
Admission: RE | Admit: 2016-06-01 | Discharge: 2016-06-01 | Disposition: A | Discharge status: Home / Self Care | Payer: Medicaid Other | Source: Ambulatory Visit | Attending: Cardiology | Admitting: Cardiology

## 2016-06-01 DIAGNOSIS — I340 Nonrheumatic mitral (valve) insufficiency: Secondary | ICD-10-CM | POA: Insufficient documentation

## 2016-06-01 DIAGNOSIS — I255 Ischemic cardiomyopathy: Secondary | ICD-10-CM | POA: Diagnosis not present

## 2016-06-01 DIAGNOSIS — Z720 Tobacco use: Secondary | ICD-10-CM | POA: Diagnosis not present

## 2016-06-01 DIAGNOSIS — I2510 Atherosclerotic heart disease of native coronary artery without angina pectoris: Secondary | ICD-10-CM | POA: Insufficient documentation

## 2016-06-01 DIAGNOSIS — I071 Rheumatic tricuspid insufficiency: Secondary | ICD-10-CM | POA: Diagnosis not present

## 2016-06-01 DIAGNOSIS — I351 Nonrheumatic aortic (valve) insufficiency: Secondary | ICD-10-CM | POA: Diagnosis not present

## 2016-06-01 DIAGNOSIS — I429 Cardiomyopathy, unspecified: Secondary | ICD-10-CM | POA: Diagnosis present

## 2016-06-01 DIAGNOSIS — I119 Hypertensive heart disease without heart failure: Secondary | ICD-10-CM | POA: Insufficient documentation

## 2016-06-01 LAB — ECHOCARDIOGRAM COMPLETE
CHL CUP MV DEC (S): 257
CHL CUP RV SYS PRESS: 30 mmHg
EERAT: 14.43
EWDT: 257 ms
FS: 17 % — AB (ref 28–44)
IV/PV OW: 1
LA vol A4C: 48.6 ml
LA vol index: 34.4 mL/m2
LADIAMINDEX: 2.94 cm/m2
LASIZE: 47 mm
LAVOL: 55 mL
LEFT ATRIUM END SYS DIAM: 47 mm
LV E/e' medial: 14.43
LV TDI E'LATERAL: 6.62
LV sys vol index: 50 mL/m2
LVDIAVOL: 123 mL — AB (ref 46–106)
LVDIAVOLIN: 77 mL/m2
LVEEAVG: 14.43
LVELAT: 6.62 cm/s
LVOT VTI: 23.4 cm
LVOT area: 2.27 cm2
LVOT diameter: 17 mm
LVOT peak grad rest: 5 mmHg
LVOTPV: 114 cm/s
LVOTSV: 53 mL
LVSYSVOL: 80 mL — AB (ref 14–42)
MV Peak grad: 4 mmHg
MV VTI: 184 cm
MVPKAVEL: 81 m/s
MVPKEVEL: 95.5 m/s
PISA EROA: 0.06 cm2
PW: 10.4 mm — AB (ref 0.6–1.1)
RV LATERAL S' VELOCITY: 12.3 cm/s
Reg peak vel: 258 cm/s
Simpson's disk: 35
Stroke v: 43 ml
TAPSE: 21.6 mm
TDI e' medial: 4.19
TR max vel: 258 cm/s

## 2016-06-01 NOTE — Progress Notes (Signed)
*  PRELIMINARY RESULTS* Echocardiogram 2D Echocardiogram has been performed.  Samuel Germany 06/01/2016, 2:38 PM

## 2016-06-07 ENCOUNTER — Ambulatory Visit (HOSPITAL_COMMUNITY): Payer: Medicaid Other | Attending: Hematology & Oncology | Admitting: Speech Pathology

## 2016-06-07 DIAGNOSIS — R1312 Dysphagia, oropharyngeal phase: Secondary | ICD-10-CM | POA: Diagnosis present

## 2016-06-07 NOTE — Therapy (Signed)
Gina Frederick, [place], [address] Phone: [phone]   Fax:  [phone]  Speech Language Pathology Treatment  Patient Details  Name: Gina Frederick MRN: [id number] Date of Birth: [birth year] No Data Recorded  Encounter Date: 06/07/2016      End of Session - 06/07/16 1203    Visit Number 2   Number of Visits 4   Authorization Type Medicaid   Authorization Time Period 05/22/2016-08/21/2016   Authorization - Visit Number 1   Authorization - Number of Visits 3   SLP Start Time [id number]   SLP Stop Time  1130   SLP Time Calculation (min) 45 min   Activity Tolerance Patient tolerated treatment well      Past Medical History:  Diagnosis Date  . Arthritis   . Centrilobular emphysema ([place])   . Edentulous    Multiple dental extractions March 2017  . Essential hypertension   . Hyperlipidemia   . Ischemic cardiomyopathy    LVEF 40-45%  . NSTEMI (non-ST elevated myocardial infarction) [facility])    April 2017 - managed medically due to comorbid illnesses  . PAF (paroxysmal atrial fibrillation) ([place])   . Squamous cell carcinoma (HCC)    Invasive - right neck, XRT and Cisplatin - Dr. Whitney Muse    Past Surgical History:  Procedure Laterality Date  . CESAREAN SECTION    . KNEE ARTHROSCOPY Right   . MASS BIOPSY Right 12/21/2015   Procedure: NECK MASS BIOPSY;  Surgeon: Leta Baptist, MD;  Location: [place];  Service: ENT;  Laterality: Right;  . MULTIPLE EXTRACTIONS WITH ALVEOLOPLASTY N/A 01/13/2016   Procedure: EXTRACTION OF TOOTH #'S 6-12,15, 19-28, 30 WITH ALVEOLOPLASTY AND BILATERAL MAXILLARY TUBEROSITY REDUCTIONS;  Surgeon: Lenn Cal, DDS;  [facility];  Service: Oral Surgery;  Laterality: N/A;  . PANENDOSCOPY N/A 12/21/2015   Procedure: PANENDOSCOPY;  Surgeon: Leta Baptist, MD;  Location: [place];  Service: ENT;  Laterality: N/A;  . TONSILLECTOMY      There were no vitals filed for this  visit.      Subjective Assessment - 06/07/16 1157    Subjective "I feel pretty good!"   Currently in Pain? No/denies               ADULT SLP TREATMENT - 06/07/16 0001      General Information   Behavior/Cognition Alert;Cooperative;Pleasant mood   Patient Positioning Upright in chair   Oral care provided N/A   HPI 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 She was treated with chemoradiation therapy, but was unable to complete it fully due to kidney function. She was referred to SLP by Dr. Whitney Muse as part of head and neck cancer protocol. Pt was orginally scheduled in March 2017, but was too ill to participate. She was admitted to [facility] 03/03/2016-03/10/2016 for NSTEMI, ARF, CHF, Afib with RVR, and respiratory failure with hypoxia.     Treatment Provided   Treatment provided Dysphagia     Dysphagia Treatment   Temperature Spikes Noted No   Respiratory Status Room air   Oral Cavity - Dentition Edentulous  teeth extractions 01/21/16 for radiation   Treatment Methods Skilled observation;Therapeutic exercise;Patient/caregiver education   Patient observed directly with PO's Yes   Type of PO's observed Thin liquids   Feeding Able to feed self   Liquids provided via Cup   Pharyngeal Phase Signs & Symptoms Wet vocal  quality  mild   Type of cueing Verbal   Amount of cueing Modified independent     Pain Assessment   Pain Assessment No/denies pain     Assessment / Recommendations / Plan   Plan Continue with current plan of care     Dysphagia Recommendations   Diet recommendations Dysphagia 3 (mechanical soft);Thin liquid   Liquids provided via Cup   Medication Administration Whole meds with liquid   Supervision Patient able to self feed   Compensations Multiple dry swallows after each bite/sip;Clear throat intermittently   Postural Changes and/or Swallow Maneuvers Out of bed for meals;Seated upright 90 degrees;Upright  30-60 min after meal     General Recommendations   Oral Care Recommendations Oral care BID;Patient independent with oral care     Progression Toward Goals   Progression toward goals Progressing toward goals          SLP Education - 06/07/16 1202    Education provided Yes   Education Details Provided education regarding soft food ideas, aspiration precautions, specific exercises   Person(s) Educated Patient   Methods Explanation;Demonstration;Handout   Comprehension Verbalized understanding          SLP Short Term Goals - 06/07/16 1214      SLP SHORT TERM GOAL #1   Title Pt will complete oral motor and pharyngeal strengthening exercises with min cues over 3 consecutive sessions with use of written prompt as needed    Baseline Not completing   Time 4   Period Months   Status On-going     SLP SHORT TERM GOAL #2   Title Pt will record items in food journal on 4+ days per week    Baseline Not completing   Time 4   Period Months   Status On-going          SLP Long Term Goals - 06/07/16 1215      SLP LONG TERM GOAL #1   Title Pt will demonstrate safe and efficient consumption of self-regulated regular textures and unrestricted liquids with use of compensatory strategies as needed.   Baseline Showing signs (wet vocal quality and coughing)   Time 4   Period Months   Status On-going          Plan - 06/07/16 1204    Clinical Impression Statement Pt seen for dysphagia therapy and ongoing education s/p chemo/rad for head and neck cancer. Pt reports tolerating of PEG feeds (4 cans per day) and eating by mouth "I eat anything I can get my hands on". She was weighed in treatment gym (127.5#). She has been consuming a variety of items that are soft in texture: pinto beans, broccoli, green beans, soups, eggs, meats. She goes for PET scan on 06/26/2016 which she says will help determine whether they need "to do surgery to remove everything". Pt denies difficulty swallowing as  long as she takes her time. She tolerated thin water by cup this date with min wet vocal quality which she spontaneously clears via throat clears. Education reviewed and provided in written form. Pt encouraged to complete chin tuck against resistance exercise, lingual ROM, lingual press, and effortful swallows. She was able to return demonstrate with min SLP cues. SLP provided encouragement given progress and recommended ongoing completion of exercises. Pt in agreement with plan of care and is scheduled to return 06/27/2016 after her PET scan.   Speech Therapy Frequency Monthly   Treatment/Interventions Aspiration precaution training;Pharyngeal strengthening exercises;Diet toleration management by SLP;SLP instruction and feedback;Patient/family  education   Potential to Achieve Goals Good   Potential Considerations --  Pending PET scan 06/26/16   SLP Home Exercise Plan Pt will complete HEP as assigned to facilitate carryover of treatment strategies and techniques in home enviroment.   Consulted and Agree with Plan of Care Patient      Patient will benefit from skilled therapeutic intervention in order to improve the following deficits and impairments:   Dysphagia, oropharyngeal phase    Problem List Patient Active Problem List   Diagnosis Date Noted  . Insomnia 05/11/2016  . Anemia in chronic kidney disease (CKD) 03/13/2016  . Acute heart failure ([place])   . ARF (acute renal failure) ([place])   . Chronic systolic CHF (congestive heart failure) ([place])   . Pulmonary hypertension ([place])   . Atrial fibrillation with RVR ([place])   . Essential hypertension   . Acute respiratory failure with hypoxia ([place])   . Chronic obstructive pulmonary disease ([place])   . Acute renal failure ([place])   . Squamous cell carcinoma of neck ([place])   . NSTEMI (non-ST elevated myocardial infarction) ([place]) 03/03/2016  . Cancer related pain 02/07/2016  . Insomnia due to medical condition 02/07/2016  . Absolute anemia 01/22/2016   . Port-a-cath in place 01/22/2016  . Pre-chemoradiation therapy dental protocol examination 01/07/2016  . Squamous cell carcinoma of head and neck ([place]) 12/28/2015  . Tobacco use 12/28/2015  . HTN (hypertension) 12/28/2015   Thank you,  Genene Churn, [place]  [facility] 06/07/2016, 12:16 PM  [facility] [address] [place], [place], [address] Phone: [phone]   Fax:  [phone]   Name: Gina Frederick MRN: [id number] Date of Birth: 1956/09/29

## 2016-06-12 ENCOUNTER — Ambulatory Visit (HOSPITAL_COMMUNITY): Payer: Medicaid Other

## 2016-06-18 ENCOUNTER — Encounter (HOSPITAL_COMMUNITY): Payer: Self-pay | Admitting: Hematology & Oncology

## 2016-06-19 ENCOUNTER — Ambulatory Visit (HOSPITAL_COMMUNITY): Payer: Medicaid Other

## 2016-06-20 ENCOUNTER — Other Ambulatory Visit (HOSPITAL_COMMUNITY): Payer: Self-pay

## 2016-06-20 DIAGNOSIS — C760 Malignant neoplasm of head, face and neck: Secondary | ICD-10-CM

## 2016-06-20 NOTE — Progress Notes (Unsigned)
Refill for Lorazepam #45 called in to [facility].

## 2016-06-26 ENCOUNTER — Ambulatory Visit (HOSPITAL_COMMUNITY): Payer: Medicaid Other

## 2016-06-26 ENCOUNTER — Ambulatory Visit (HOSPITAL_COMMUNITY)
Admission: RE | Admit: 2016-06-26 | Discharge: 2016-06-26 | Disposition: A | Discharge status: Home / Self Care | Payer: Medicaid Other | Source: Ambulatory Visit | Attending: Radiation Oncology | Admitting: Radiation Oncology

## 2016-06-26 DIAGNOSIS — K5730 Diverticulosis of large intestine without perforation or abscess without bleeding: Secondary | ICD-10-CM | POA: Diagnosis not present

## 2016-06-26 DIAGNOSIS — R634 Abnormal weight loss: Secondary | ICD-10-CM | POA: Diagnosis present

## 2016-06-26 DIAGNOSIS — R918 Other nonspecific abnormal finding of lung field: Secondary | ICD-10-CM | POA: Diagnosis not present

## 2016-06-26 DIAGNOSIS — J90 Pleural effusion, not elsewhere classified: Secondary | ICD-10-CM | POA: Insufficient documentation

## 2016-06-26 DIAGNOSIS — C760 Malignant neoplasm of head, face and neck: Secondary | ICD-10-CM | POA: Insufficient documentation

## 2016-06-26 DIAGNOSIS — C770 Secondary and unspecified malignant neoplasm of lymph nodes of head, face and neck: Secondary | ICD-10-CM

## 2016-06-26 DIAGNOSIS — I700 Atherosclerosis of aorta: Secondary | ICD-10-CM | POA: Diagnosis not present

## 2016-06-26 DIAGNOSIS — K8020 Calculus of gallbladder without cholecystitis without obstruction: Secondary | ICD-10-CM | POA: Insufficient documentation

## 2016-06-26 DIAGNOSIS — J439 Emphysema, unspecified: Secondary | ICD-10-CM | POA: Diagnosis not present

## 2016-06-26 LAB — GLUCOSE, CAPILLARY: GLUCOSE-CAPILLARY: 117 mg/dL — AB (ref 65–99)

## 2016-06-26 MED ORDER — FLUDEOXYGLUCOSE F - 18 (FDG) INJECTION
6.3000 | Freq: Once | INTRAVENOUS | Status: AC | PRN
  Administered 2016-06-26: 6.3 via INTRAVENOUS

## 2016-06-27 ENCOUNTER — Ambulatory Visit (HOSPITAL_COMMUNITY): Payer: Medicaid Other | Admitting: Speech Pathology

## 2016-06-28 ENCOUNTER — Encounter (HOSPITAL_COMMUNITY): Payer: Medicaid Other | Attending: Hematology & Oncology | Admitting: Hematology & Oncology

## 2016-06-28 ENCOUNTER — Encounter (HOSPITAL_COMMUNITY): Payer: Self-pay | Admitting: Hematology & Oncology

## 2016-06-28 ENCOUNTER — Encounter: Payer: Self-pay | Admitting: Dietician

## 2016-06-28 VITALS — BP 129/72 | HR 72 | Temp 97.5°F | Resp 18 | Wt 132.0 lb

## 2016-06-28 DIAGNOSIS — Z720 Tobacco use: Secondary | ICD-10-CM

## 2016-06-28 DIAGNOSIS — C760 Malignant neoplasm of head, face and neck: Secondary | ICD-10-CM

## 2016-06-28 DIAGNOSIS — I252 Old myocardial infarction: Secondary | ICD-10-CM | POA: Diagnosis not present

## 2016-06-28 DIAGNOSIS — I10 Essential (primary) hypertension: Secondary | ICD-10-CM | POA: Diagnosis not present

## 2016-06-28 DIAGNOSIS — J432 Centrilobular emphysema: Secondary | ICD-10-CM | POA: Diagnosis not present

## 2016-06-28 DIAGNOSIS — Z95828 Presence of other vascular implants and grafts: Secondary | ICD-10-CM

## 2016-06-28 DIAGNOSIS — D649 Anemia, unspecified: Secondary | ICD-10-CM | POA: Insufficient documentation

## 2016-06-28 DIAGNOSIS — D7589 Other specified diseases of blood and blood-forming organs: Secondary | ICD-10-CM

## 2016-06-28 NOTE — Progress Notes (Signed)
[facility] [place] [facility] NOTE  Patient Care Team: Noel Journey. 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 Benjamine Mola 12/21/2015 No obvious primary lesion noted on panendoscopy examination CT neck 12/17/2015 large infiltrative tumor mass extends to skin surface, infiltrates R patorid, R submandibular, R carotid sheath,Thrombosis of R IJ vein, multiple satellite nodules Centrilobular emphysema P16 negative Tobacco Abuse Port a cath/FT placement on 01/17/2016 Multiple dental extractions with Dr. Enrique Sack 01/13/3016    Squamous cell carcinoma of head and neck ([place])   12/17/2015 Imaging    CT neck- large infiltrative tumor mass extends to skin surface, infiltrates R patorid, R submandibular, R carotid sheath,Thrombosis of R IJ vein, multiple satellite nodules      12/21/2015 Procedure    Soft tissue, biopsy, right neck mass by Dr. Benjamine Mola  Direct laryngoscopy, rigid esophagoscopy, and bronchoscopy       12/21/2015 Pathology Results    - SQUAMOUS CELL CARCINOMA.  The carcinoma appears moderately to poorly differentiated.  P16 NEGATIVE.      12/28/2015 Initial Diagnosis    Squamous cell carcinoma of head and neck ([place])      01/03/2016 Imaging    CT CAP- Large right-sided neck mass. No evidence for lymphadenopathy or metastatic disease in the chest, abdomen, or pelvis. 9 mm ground-glass nodule in the posterior right lung apex is probably related to scarring given the underlying emphysema      01/04/2016 Imaging    Bone scan- No evidence of metastatic disease.      01/17/2016 Procedure    IR port and feeding tube placed      01/19/2016 PET scan    The dominant right neck mass is hypermetabolic.  In addition, there are hypermetabolic smaller cervical lymph nodes bilaterally.  No discrete lesion of the pharyngeal mucosal space identified. The dominant right neck mass and its hypermetabolic activi...      01/31/2016 -  Chemotherapy    Cisplatin      02/01/2016 - 03/28/2016 Radiation Therapy    70 Gy by Dr. Isidore Moos.      03/03/2016 - 03/10/2016 Hospital Admission    NSTEMI, ARF, CHF, Afib with RVR, respiratory failure with hypoxia      06/26/2016 PET scan    At least a significant partial metabolic response. Infiltrative mildly hypermetabolic right lateral neck mass is significantly decreased in size and metabolism. The residual mild hypermetabolism within the infiltrative right lateral neck mass is nonspecific and could represent post treatment change versus residual tumor. No additional residual hypermetabolic lymph nodes in the neck. No new hypermetabolic metastatic disease. New 3 mm right lower lobe pulmonary nodule, nonspecific and below PET resolution.       HISTORY OF PRESENTING ILLNESS:  Gina Frederick 60 y.o. 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   [name] is accompanied by female friend. I personally reviewed and went over PET results with the patient.  She has stopped throwing up. She believes prior nausea and vomiting was due to radiation treatment. She has gained 10 lbs since 05/26/16.  She met and spoke with Dr. Isidore Moos about her PET results. She is aware that a repeat PET scan is recommended.   She reports the feeling of  fluid collection in her right neck. She met with PT and learned exercises to perform. She notes that she does not routinely do them.  In the mornings her throat is open the most and she is able to swallow without issue. As the day progresses it becomes harder for her to swallow as it feels like her throat is "closing up."   She is able to eat solid food. She drinks Ensure daily. She pushes 4  cans of Osmolite daily via her FT, noting if she tries to push 5 cans then she throws up.   She continues to smoke "a few" cigarettes daily.   MEDICAL HISTORY:  Past Medical History:  Diagnosis Date  . Arthritis   . Centrilobular emphysema ([place])   . Edentulous    Multiple dental extractions March 2017  . Essential hypertension   . Hyperlipidemia   . Ischemic cardiomyopathy    LVEF 40-45%  . NSTEMI (non-ST elevated myocardial infarction) [facility])    April 2017 - managed medically due to comorbid illnesses  . PAF (paroxysmal atrial fibrillation) ([place])   . Squamous cell carcinoma (HCC)    Invasive - right neck, XRT and Cisplatin - Dr. Whitney Muse    SURGICAL HISTORY: Past Surgical History:  Procedure Laterality Date  . CESAREAN SECTION    . KNEE ARTHROSCOPY Right   . MASS BIOPSY Right 12/21/2015   Procedure: NECK MASS BIOPSY;  Surgeon: Leta Baptist, MD;  Location: [facility];  Service: ENT;  Laterality: Right;  . MULTIPLE EXTRACTIONS WITH ALVEOLOPLASTY N/A 01/13/2016   Procedure: EXTRACTION OF TOOTH #'S 6-12,15, 19-28, 30 WITH ALVEOLOPLASTY AND BILATERAL MAXILLARY TUBEROSITY REDUCTIONS;  Surgeon: Lenn Cal, DDS;  Location: [facility];  Service: Oral Surgery;  Laterality: N/A;  . PANENDOSCOPY N/A 12/21/2015   Procedure: PANENDOSCOPY;  Surgeon: Leta Baptist, MD;  Location: [facility];  Service: ENT;  Laterality: N/A;  . TONSILLECTOMY      SOCIAL HISTORY: Social History   Social History  . Marital status: Divorced    Spouse name: N/A  . Number of children: 1  . Years of education: N/A   Occupational History  . Not on file.   Social History Main Topics  . Smoking status: Former Smoker    Packs/day: 0.25    Years: 20.00    Types: Cigarettes  . Smokeless tobacco: Never Used     Comment: She took a puff two days ago  . Alcohol use No  . Drug use: No  . Sexual activity: No   Other Topics Concern  . Not on file   Social History Narrative   Lives with a  lady she takes care of.    She is divorced. One daughter, no grandchildren Smokes, notes that she is down to 3 a day. Began smoking "probably in her 36's." She says she has quit "on and off through the years." Denies problems with EtOH. 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 Quit working when her daughter was "of age to go to school," then stayed home with her daughter. For hobbies, she says she likes to read.  Is currently the caregiver for a lady with diabetes.  She does light housekeeping and keeps her on her medications, and cooks. The son of the woman she cares for, Herbie Baltimore, notes that her good cooking has brought his momma's blood pressure and blood sugar down.  She notes she has a good  support system with her sister, and with the son of the woman she takes care of.  FAMILY HISTORY: Family History  Problem Relation Age of Onset  . Alzheimer's disease Mother   . Congestive Heart Failure Father 11   indicated that her mother is deceased. She indicated that her father is deceased.    Mother and father are deceased. Mother was 57; died of Alzheimer's. Father was 71; congestive heart failure. Has a sister, age 2 this year. Had a brother, deceased. He took his own life. 4 nieces and nephews.  ALLERGIES:  is allergic to codeine.  MEDICATIONS:  Current Outpatient Prescriptions  Medication Sig Dispense Refill  . acetaminophen (TYLENOL) 500 MG tablet Take 1,000 mg by mouth every 6 (six) hours as needed for mild pain or moderate pain.     Marland Kitchen apixaban (ELIQUIS) 5 MG TABS tablet Take 1 tablet (5 mg total) by mouth 2 (two) times daily. 60 tablet 3  . atorvastatin (LIPITOR) 40 MG tablet Take 1 tablet (40 mg total) by mouth daily at 6 PM. 30 tablet 3  . benzonatate (TESSALON) 100 MG capsule Take 1 capsule (100 mg total) by mouth 3 (three) times daily as needed for cough. 20 capsule 0  . diphenhydrAMINE  (BENADRYL) 25 MG tablet Take 25 mg by mouth every 6 (six) hours as needed. Reported on 03/13/2016    . feeding supplement, ENSURE ENLIVE, (ENSURE ENLIVE) LIQD Take 237 mLs by mouth 2 (two) times daily between meals. 237 mL 0  . fluconazole (DIFLUCAN) 100 MG tablet Take 100 mg by mouth daily. Reported on 03/31/2016    . lidocaine-prilocaine (EMLA) cream Apply a quarter size amount to port site 1 hour prior to chemo. Do not rub in. Cover with plastic wrap. 30 g 3  . LORazepam (ATIVAN) 0.5 MG tablet Take 1 tablet (0.5 mg total) by mouth every 4 (four) hours as needed for anxiety (one tab every 4-6 hours as needed for nausea and vomiting). 45 tablet 1  . metoprolol (LOPRESSOR) 100 MG tablet Take 1 tablet (100 mg total) by mouth 2 (two) times daily. 60 tablet 3  . nitroGLYCERIN (NITROSTAT) 0.4 MG SL tablet Place 1 tablet (0.4 mg total) under the tongue every 5 (five) minutes x 3 doses as needed for chest pain. 15 tablet 0  . POT BICARB-POT CHLORIDE,25MEQ, 25 MEQ TBEF Take 1.6 tablets (40 mEq total) by mouth daily. 30 tablet 2  . prochlorperazine (COMPAZINE) 10 MG tablet Take 1 tablet (10 mg total) by mouth every 6 (six) hours as needed for nausea or vomiting. 30 tablet 2  . zolpidem (AMBIEN) 10 MG tablet Take 1 tablet (10 mg total) by mouth at bedtime as needed for sleep. 30 tablet 2   No current facility-administered medications for this visit.    Facility-Administered Medications Ordered in Other Visits  Medication Dose Route Frequency Provider Last Rate Last Dose  . sodium polystyrene (KAYEXALATE) 15 GM/60ML suspension 30 g  30 g Oral Once B[facility], PA-C        Review of Systems  Constitutional: Negative for chills, fever and weight loss.  HENT: Negative for congestion, hearing loss, nosebleeds, sore throat and tinnitus.        Hoarse voice.  Eyes: Negative.  Negative for blurred vision, double vision, pain and discharge.  Respiratory: Negative for hemoptysis, shortness of breath and  wheezing.   Cardiovascular: Negative for chest pain, palpitations, claudication, leg swelling and PND.  Gastrointestinal: Negative for abdominal pain, blood in  stool, constipation, diarrhea, heartburn, melena, nausea and vomiting.  Genitourinary: Negative.  Negative for dysuria, frequency, hematuria and urgency.  Musculoskeletal: Negative for falls, joint pain and myalgias.  Skin: Negative.  Negative for itching and rash.  Neurological: Negative for dizziness, tingling, tremors, sensory change, speech change, focal weakness, seizures, loss of consciousness and headaches.  Endo/Heme/Allergies: Negative.  Does not bruise/bleed easily.  Psychiatric/Behavioral: Negative for depression, memory loss, substance abuse and suicidal ideas. The patient is not nervous/anxious.   All other systems reviewed and are negative.  14 point ROS was done and is otherwise as detailed above or in HPI    PHYSICAL EXAMINATION: ECOG PERFORMANCE STATUS: 1 - Symptomatic but completely ambulatory  Vitals with BMI 06/28/2016  Height   Weight 132 lbs  BMI   Systolic 219  Diastolic 72  Pulse 72  Respirations 18    Physical Exam  Constitutional: She is oriented to person, place, and time and well-developed, well-nourished, and in no distress.  Wears glasses.   HENT:  Head: Normocephalic and atraumatic.  Nose: Nose normal.  Mouth/Throat: No oropharyngeal exudate.  She has mild lymphedema under her chin   Eyes: Conjunctivae and EOM are normal. Pupils are equal, round, and reactive to light. Right eye exhibits no discharge. Left eye exhibits no discharge. No scleral icterus.  Neck: Normal range of motion. Neck supple. No tracheal deviation present. No thyromegaly present.  R neck firm,  radiation induced skin changes, overall well healed  Cardiovascular: Normal rate, regular rhythm and normal heart sounds.  Exam reveals no gallop and no friction rub.   No murmur heard. Pulmonary/Chest: Effort normal and breath  sounds normal. She has no wheezes. She has no rales.  Abdominal: Soft. Bowel sounds are normal. She exhibits no distension and no mass. There is no tenderness. There is no rebound and no guarding.  FT C/D/I  Musculoskeletal: Normal range of motion.  Lymphadenopathy:    She has no cervical adenopathy.  Neurological: She is alert and oriented to person, place, and time. She has normal reflexes. No cranial nerve deficit. Gait normal. Coordination normal.  Skin: Skin is warm and dry. No rash noted.  Psychiatric: Mood, memory, affect and judgment normal.  Nursing note and vitals reviewed.   LABORATORY DATA:  I have reviewed the data as listed  Lab Results  Component Value Date   WBC 4.5 05/19/2016   HGB 9.9 (L) 05/19/2016   HCT 30.1 (L) 05/19/2016   MCV 103.8 (H) 05/19/2016   PLT 265 05/19/2016   CMP     Component Value Date/Time   NA 127 (L) 05/19/2016 0953   K 5.2 (H) 05/19/2016 0953   CL 88 (L) 05/19/2016 0953   CO2 33 (H) 05/19/2016 0953   GLUCOSE 115 (H) 05/19/2016 0953   BUN 19 05/19/2016 0953   CREATININE 1.00 05/19/2016 0953   CALCIUM 9.5 05/19/2016 0953   PROT 6.4 (L) 05/19/2016 0953   ALBUMIN 3.4 (L) 05/19/2016 0953   AST 19 05/19/2016 0953   ALT 16 05/19/2016 0953   ALKPHOS 94 05/19/2016 0953   BILITOT 0.5 05/19/2016 0953   GFRNONAA 60 (L) 05/19/2016 0953   GFRAA >60 05/19/2016 0953     RADIOGRAPHIC STUDIES: I have personally reviewed the radiological images as listed and agreed with the findings in the report. Study Result   CLINICAL DATA:  Subsequent treatment strategy for squamous cell carcinoma of the right neck status post chemotherapy and radiation therapy. Restaging.  EXAM: NUCLEAR MEDICINE PET SKULL BASE  TO THIGH  TECHNIQUE: 6.3 mCi F-18 FDG was injected intravenously. Full-ring PET imaging was performed from the skull base to thigh after the radiotracer. CT data was obtained and used for attenuation correction and  anatomic localization.  FASTING BLOOD GLUCOSE:  Value: 117 mg/dl  COMPARISON:  01/19/2016 PET-CT.  FINDINGS: NECK  Infiltrative right lateral neck mass centered at the level of the inferior hyoid bone measures 4.2 x 3.2 cm (series 4/ image 36) with mild residual hypermetabolism with max SUV 5.5, previously 7.0 x 6.8 cm with max SUV 20.7, significantly decreased in size and metabolism.  No additional hypermetabolic masses / lymph nodes remain in the neck. No discrete hypermetabolic mass in the pharynx or larynx.  Mucoperiosteal thickening in the right maxillary sinus.  CHEST  No hypermetabolic axillary, mediastinal or hilar nodes. Left internal jugular MediPort terminates at the cavoatrial junction. Right coronary atherosclerosis. Atherosclerotic nonaneurysmal thoracic aorta. Trace right pleural effusion. No left pleural effusion. Moderate centrilobular emphysema and mild diffuse bronchial wall thickening. There is new interlobular septal thickening and patchy ground-glass opacity at the right greater than left lung apices with associated mild hypermetabolism with max SUV 3.7, favor evolving radiation change. New 3 mm solid anterior right lower lobe pulmonary nodule (series 8/image 30), below PET resolution. Thick parenchymal band in the basilar right lower lobe is new and non hypermetabolic, most consistent with atelectasis and/or evolving postinfectious/postinflammatory scarring.  ABDOMEN/PELVIS  No abnormal hypermetabolic activity within the liver, pancreas, adrenal glands, or spleen. No hypermetabolic lymph nodes in the abdomen or pelvis. Cholelithiasis. Percutaneous gastrostomy tube is in place in the gastric body. Marked sigmoid diverticulosis.  SKELETON  No focal hypermetabolic activity to suggest skeletal metastasis.  IMPRESSION: 1. At least a significant partial metabolic response. Infiltrative mildly hypermetabolic right lateral neck mass is  significantly decreased in size and metabolism. The residual mild hypermetabolism within the infiltrative right lateral neck mass is nonspecific and could represent post treatment change versus residual tumor. No additional residual hypermetabolic lymph nodes in the neck. No new hypermetabolic metastatic disease. 2. New 3 mm right lower lobe pulmonary nodule, nonspecific and below PET resolution. Recommend attention on a follow-up chest CT in 3-6 months. 3. New septal thickening and patchy ground-glass opacity at the lung apices with associated mild hypermetabolism, favor evolving radiation change. 4. Additional findings include aortic atherosclerosis, trace right pleural effusion, moderate emphysema, cholelithiasis and marked sigmoid diverticulosis.   Electronically Signed   By: Ilona Sorrel M.D.   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 Benjamine Mola 12/21/2015 No obvious primary lesion noted on panendoscopy examination CT neck 12/17/2015 large infiltrative tumor mass extends to skin surface, infiltrates R patorid, R submandibular, R carotid sheath,Thrombosis of R IJ vein, multiple satellite nodules Centrilobular emphysema P16 negative Tobacco Use Tobacco Abuse Port a cath/FT placement on 01/17/2016 Multiple dental extractions with Dr. Enrique Sack 01/13/3016 Hyponatremia HTN NSTEMI Macrocytosis, normal B12, folate  She seems to be doing better. She has gained 10 pounds. He then met with the patient today. She is taking some food orally and denies any difficulties with swallowing. She does report that it gets harder to swallow as the day progresses. She denies choking.  Plan is to repeat pet imaging in the future, this was discussed with Dr. Isidore Moos yesterday.  Patient does not need any refills at this time. We  discussed smoking  cessation again.  She will return for follow up in 6 weeks. At this next visit, we will recheck her thyroid function,reassess her weight and recheck routine labs.  All questions were answered. The patient knows to call the clinic with any problems, questions or concerns.  This document serves as a record of services personally performed by Ancil Linsey, MD. It was created on her behalf by Arlyce Harman, a trained medical scribe. The creation of this record is based on the scribe's personal observations and the provider's statements to them. This document has been checked and approved by the attending provider.  I have reviewed the above documentation for accuracy and completeness, and I agree with the above.  This note was electronically signed.  Molli Hazard, MD  06/28/2016 7:11 PM

## 2016-06-28 NOTE — Patient Instructions (Addendum)
[place] at [facility] Discharge Instructions  RECOMMENDATIONS MADE BY THE CONSULTANT AND ANY TEST RESULTS WILL BE SENT TO YOUR REFERRING PHYSICIAN.  You saw Dr. Whitney Muse today. Follow up in 6 weeks with either Tom or Dr. Whitney Muse Labs at 6 week follow up also.  Thank you for choosing [place] at [facility] to provide your oncology and hematology care.  To afford each patient quality time with our provider, please arrive at least 15 minutes before your scheduled appointment time.   Beginning January 23rd 2017 lab work for the [name] will be done in the  Main lab at [facility] on 1st floor. If you have a lab appointment with the [facility] please come in thru the  Main Entrance and check in at the main information desk  You need to re-schedule your appointment should you arrive 10 or more minutes late.  We strive to give you quality time with our providers, and arriving late affects you and other patients whose appointments are after yours.  Also, if you no show three or more times for appointments you may be dismissed from the clinic at the providers discretion.     Again, thank you for choosing [facility].  Our hope is that these requests will decrease the amount of time that you wait before being seen by our physicians.       _____________________________________________________________  Should you have questions after your visit to [facility], please contact our office at [phone] between the hours of 8:30 a.m. and 4:30 p.m.  Voicemails left after 4:30 p.m. will not be returned until the following business day.  For prescription refill requests, have your pharmacy contact our office.         Resources For Cancer Patients and their Caregivers ? American Cancer Society: Can assist with transportation, wigs, general needs, runs Look Good Feel Better.        (310)599-9520 ? Cancer  Care: Provides financial assistance, online support groups, medication/co-pay assistance.  1-800-813-HOPE (216)178-4699) ? [place] Assists [place] cancer patients and their families through emotional , educational and financial support.  (236) 532-2770 ? [place] DSS Where to apply for food stamps, Medicaid and utility assistance. 228-736-0307 ? [facility]: Transportation to medical appointments. 587 163 6342 ? Social Security Administration: May apply for disability if have a Stage IV cancer. 478 280 6468 772 049 8013 ? [employer], Disability and Transit Services: Assists with nutrition, care and transit needs. [place] Support Programs: @10RELATIVEDAYS @ > Cancer Support Group  2nd Tuesday of the month 1pm-2pm, Journey Room  > Creative Journey  3rd Tuesday of the month 1130am-1pm, Journey Room  > Look Good Feel Better  1st Wednesday of the month 10am-12 noon, Journey Room (Call [facility] to register [phone])

## 2016-06-28 NOTE — Progress Notes (Signed)
Follow up with H&N patient s/p chemoradiation. Still using TF via PEG.   Contacted Pt by visiting prior to her office visit today  Wt Readings from Last 10 Encounters:  06/28/16 132 lb (59.9 kg)  05/26/16 122 lb (55.3 kg)  05/19/16 124 lb 14.4 oz (56.7 kg)  04/14/16 127 lb 9.6 oz (57.9 kg)  04/07/16 132 lb 12.8 oz (60.2 kg)  03/31/16 139 lb 1.6 oz (63.1 kg)  03/10/16 142 lb 3.2 oz (64.5 kg)  03/03/16 146 lb (66.2 kg)  03/02/16 143 lb 6.4 oz (65 kg)  02/28/16 143 lb 11.2 oz (65.2 kg)   Congratulated her on her weight gain today. She appears to have gained 10 lbs in the last month which is very impressive if accurate. She is much more fuller appearing as well.  Her weight at treatment start was 150-155 lbs. She does not want to gain all of this back.   Patient reports oral intake as fair. She is still suffering from an element of dysphagia. She notes that in the morning she is able to eat/swallow food fine, but as the day progresses her swallow "tires" and by the end of the day she has just nibbles and has to mince her food up very well in order to eat it. Then, the next morning after sleeping, she is again able to tolerate all foods. She did note a recent experience where a piece of food "got stuck". This did frighten her, but she "was able to wash it down". She is encouraged to continue following up with ST.   She is "making the most of every bite" by using full fat/regular milk, mayo, sodas and adding butter to everything. She typically eats cereal in the morning, half a sandwich at [facility] and then, as previously stated, will nibble the rest of the day  She is still using her PEG and Osmolite. She is infusing 4 cans, 1 at a time, with 120 cc flush before and after. This is unchanged from last encounter.   Subjectively, her PO intake does seem slightly improved, but not nearly enough to warrant a 10 lb gain alone. It is likely her needs have decreased being further out from treatment or there  is some slight discrepancies in weights (underwighed last time/overweighed this time due to clothes/hydration status, constipation etc).  Regardless, she is continuing to improve in regards to her nutrition. She is presented with a case of Ensure that was ordered for her at last encounter. She is encouraged to drink these towards the end of the day when she is struggling with the solid foods. She is told to continue to try to gain weight.   RD to continue to follow   Burtis Junes RD, LDN, [facility] Pager: [phone] 06/28/2016 10:56 AM

## 2016-07-04 ENCOUNTER — Other Ambulatory Visit: Payer: Self-pay | Admitting: Radiation Oncology

## 2016-07-04 DIAGNOSIS — C770 Secondary and unspecified malignant neoplasm of lymph nodes of head, face and neck: Secondary | ICD-10-CM

## 2016-07-06 ENCOUNTER — Other Ambulatory Visit (HOSPITAL_COMMUNITY): Payer: Self-pay

## 2016-07-06 DIAGNOSIS — C760 Malignant neoplasm of head, face and neck: Secondary | ICD-10-CM

## 2016-07-06 DIAGNOSIS — G4700 Insomnia, unspecified: Secondary | ICD-10-CM

## 2016-07-06 MED ORDER — ZOLPIDEM TARTRATE 10 MG PO TABS: 10.0000 mg | ORAL_TABLET | Freq: Every evening | ORAL | 1 refills | Status: DC | PRN

## 2016-07-06 MED ORDER — LORAZEPAM 0.5 MG PO TABS: 0.5000 mg | ORAL_TABLET | ORAL | 1 refills | Status: DC | PRN

## 2016-07-06 NOTE — Telephone Encounter (Signed)
Received refill request from patients pharmacy for ativan and ambien. Refilled per Kirby Crigler, PA-C. Prescriptions faxed [facility].

## 2016-07-25 ENCOUNTER — Other Ambulatory Visit (HOSPITAL_COMMUNITY): Payer: Self-pay | Admitting: Oncology

## 2016-07-31 ENCOUNTER — Telehealth (HOSPITAL_COMMUNITY): Payer: Self-pay | Admitting: *Deleted

## 2016-07-31 DIAGNOSIS — C760 Malignant neoplasm of head, face and neck: Secondary | ICD-10-CM

## 2016-07-31 MED ORDER — LORAZEPAM 0.5 MG PO TABS: 0.5000 mg | ORAL_TABLET | ORAL | 1 refills | Status: AC | PRN

## 2016-07-31 NOTE — Telephone Encounter (Signed)
Pt called stating that she is out of nausea medication. Pt states that she has no refills left on her Ativan and it really helps with the nausea.

## 2016-08-02 ENCOUNTER — Telehealth (HOSPITAL_COMMUNITY): Payer: Self-pay | Admitting: *Deleted

## 2016-08-02 NOTE — Telephone Encounter (Signed)
Prescription was sent on 9/25 but pharmacy says they did not receive it. Re-faxed today with fax confirmation received.

## 2016-08-04 ENCOUNTER — Other Ambulatory Visit (HOSPITAL_COMMUNITY): Payer: Self-pay | Admitting: Hematology & Oncology

## 2016-08-04 DIAGNOSIS — C760 Malignant neoplasm of head, face and neck: Secondary | ICD-10-CM

## 2016-08-06 ENCOUNTER — Other Ambulatory Visit (HOSPITAL_COMMUNITY): Payer: Self-pay | Admitting: Oncology

## 2016-08-06 DIAGNOSIS — C760 Malignant neoplasm of head, face and neck: Secondary | ICD-10-CM

## 2016-08-07 ENCOUNTER — Encounter (HOSPITAL_COMMUNITY): Payer: Self-pay | Admitting: *Deleted

## 2016-08-07 ENCOUNTER — Other Ambulatory Visit (HOSPITAL_COMMUNITY): Payer: Self-pay | Admitting: Hematology & Oncology

## 2016-08-07 ENCOUNTER — Ambulatory Visit (HOSPITAL_COMMUNITY)
Admission: RE | Admit: 2016-08-07 | Discharge: 2016-08-07 | Disposition: A | Discharge status: Home / Self Care | Payer: Medicaid Other | Source: Ambulatory Visit | Attending: Hematology & Oncology | Admitting: Hematology & Oncology

## 2016-08-07 DIAGNOSIS — C760 Malignant neoplasm of head, face and neck: Secondary | ICD-10-CM

## 2016-08-07 HISTORY — PX: IR GENERIC HISTORICAL: IMG1180011

## 2016-08-09 ENCOUNTER — Encounter (HOSPITAL_COMMUNITY): Payer: Medicaid Other | Attending: Hematology & Oncology | Admitting: Hematology & Oncology

## 2016-08-09 ENCOUNTER — Encounter (HOSPITAL_COMMUNITY): Payer: Self-pay | Admitting: Hematology & Oncology

## 2016-08-09 ENCOUNTER — Encounter: Payer: Self-pay | Admitting: Dietician

## 2016-08-09 ENCOUNTER — Encounter (HOSPITAL_COMMUNITY): Payer: Medicaid Other

## 2016-08-09 VITALS — BP 141/79 | HR 97 | Temp 98.2°F | Resp 18 | Wt 119.2 lb

## 2016-08-09 DIAGNOSIS — D649 Anemia, unspecified: Secondary | ICD-10-CM | POA: Insufficient documentation

## 2016-08-09 DIAGNOSIS — D7589 Other specified diseases of blood and blood-forming organs: Secondary | ICD-10-CM | POA: Diagnosis not present

## 2016-08-09 DIAGNOSIS — E039 Hypothyroidism, unspecified: Secondary | ICD-10-CM | POA: Diagnosis not present

## 2016-08-09 DIAGNOSIS — Z720 Tobacco use: Secondary | ICD-10-CM

## 2016-08-09 DIAGNOSIS — J432 Centrilobular emphysema: Secondary | ICD-10-CM

## 2016-08-09 DIAGNOSIS — E871 Hypo-osmolality and hyponatremia: Secondary | ICD-10-CM | POA: Diagnosis not present

## 2016-08-09 DIAGNOSIS — C760 Malignant neoplasm of head, face and neck: Secondary | ICD-10-CM | POA: Insufficient documentation

## 2016-08-09 DIAGNOSIS — I10 Essential (primary) hypertension: Secondary | ICD-10-CM

## 2016-08-09 LAB — TSH: TSH: 6.462 u[IU]/mL — AB (ref 0.350–4.500)

## 2016-08-09 LAB — COMPREHENSIVE METABOLIC PANEL
ALBUMIN: 3.7 g/dL (ref 3.5–5.0)
ALT: 13 U/L — ABNORMAL LOW (ref 14–54)
ANION GAP: 6 (ref 5–15)
AST: 18 U/L (ref 15–41)
Alkaline Phosphatase: 104 U/L (ref 38–126)
BILIRUBIN TOTAL: 0.5 mg/dL (ref 0.3–1.2)
BUN: 15 mg/dL (ref 6–20)
CO2: 33 mmol/L — ABNORMAL HIGH (ref 22–32)
Calcium: 9.6 mg/dL (ref 8.9–10.3)
Chloride: 92 mmol/L — ABNORMAL LOW (ref 101–111)
Creatinine, Ser: 1.33 mg/dL — ABNORMAL HIGH (ref 0.44–1.00)
GFR, EST AFRICAN AMERICAN: 49 mL/min — AB (ref >60)
GFR, EST NON AFRICAN AMERICAN: 42 mL/min — AB (ref >60)
Glucose, Bld: 106 mg/dL — ABNORMAL HIGH (ref 65–99)
POTASSIUM: 4.2 mmol/L (ref 3.5–5.1)
Sodium: 131 mmol/L — ABNORMAL LOW (ref 135–145)
TOTAL PROTEIN: 6.5 g/dL (ref 6.5–8.1)

## 2016-08-09 LAB — CBC WITH DIFFERENTIAL/PLATELET
BASOS PCT: 0 %
Basophils Absolute: 0 10*3/uL (ref 0.0–0.1)
Eosinophils Absolute: 0.2 10*3/uL (ref 0.0–0.7)
Eosinophils Relative: 3 %
HEMATOCRIT: 29.9 % — AB (ref 36.0–46.0)
Hemoglobin: 9.5 g/dL — ABNORMAL LOW (ref 12.0–15.0)
Lymphocytes Relative: 9 %
Lymphs Abs: 0.4 10*3/uL — ABNORMAL LOW (ref 0.7–4.0)
MCH: 31.3 pg (ref 26.0–34.0)
MCHC: 31.8 g/dL (ref 30.0–36.0)
MCV: 98.4 fL (ref 78.0–100.0)
MONO ABS: 0.3 10*3/uL (ref 0.1–1.0)
MONOS PCT: 7 %
NEUTROS ABS: 3.9 10*3/uL (ref 1.7–7.7)
Neutrophils Relative %: 81 %
Platelets: 255 10*3/uL (ref 150–400)
RBC: 3.04 MIL/uL — ABNORMAL LOW (ref 3.87–5.11)
RDW: 15.1 % (ref 11.5–15.5)
WBC: 4.9 10*3/uL (ref 4.0–10.5)

## 2016-08-09 LAB — T4, FREE: FREE T4: 0.76 ng/dL (ref 0.61–1.12)

## 2016-08-09 MED ORDER — LEVOTHYROXINE SODIUM 25 MCG PO TABS
25.0000 ug | ORAL_TABLET | Freq: Every day | ORAL | 1 refills | Status: DC
Start: 2016-08-09 — End: 2016-09-11

## 2016-08-09 NOTE — Patient Instructions (Addendum)
[facility]ina [facility] at [facility] Discharge Instructions  RECOMMENDATIONS MADE BY THE CONSULTANT AND ANY TEST RESULTS WILL BE SENT TO YOUR REFERRING PHYSICIAN.  You saw Dr. Whitney Muse today. Follow up in mid November with labs. Synthroid was sent to [facility].  Thank you for choosing [place] at [facility] to provide your oncology and hematology care.  To afford each patient quality time with our provider, please arrive at least 15 minutes before your scheduled appointment time.   Beginning January 23rd 2017 lab work for the [name] will be done in the  Main lab at [facility] on 1st floor. If you have a lab appointment with the [name] please come in thru the  Main Entrance and check in at the main information desk  You need to re-schedule your appointment should you arrive 10 or more minutes late.  We strive to give you quality time with our providers, and arriving late affects you and other patients whose appointments are after yours.  Also, if you no show three or more times for appointments you may be dismissed from the clinic at the providers discretion.     Again, thank you for choosing [facility].  Our hope is that these requests will decrease the amount of time that you wait before being seen by our physicians.       _____________________________________________________________  Should you have questions after your visit to [name], please contact our office at [phone] between the hours of 8:30 a.m. and 4:30 p.m.  Voicemails left after 4:30 p.m. will not be returned until the following business day.  For prescription refill requests, have your pharmacy contact our office.         Resources For Cancer Patients and their Caregivers ? American Cancer Society: Can assist with transportation, wigs, general needs, runs Look Good Feel Better.        480-565-0593 ? Cancer Care: Provides  financial assistance, online support groups, medication/co-pay assistance.  1-800-813-HOPE 863-107-6492) ? [place] Assists [place] cancer patients and their families through emotional , educational and financial support.  214-779-2098 ? [place] DSS Where to apply for food stamps, Medicaid and utility assistance. 732-615-3815 ? [facility]: Transportation to medical appointments. 629-726-8600 ? Social Security Administration: May apply for disability if have a Stage IV cancer. 2521605897 2034457831 ? [employer], Disability and Transit Services: Assists with nutrition, care and transit needs. [place] Support Programs: @10RELATIVEDAYS @ > Cancer Support Group  2nd Tuesday of the month 1pm-2pm, Journey Room  > Creative Journey  3rd Tuesday of the month 1130am-1pm, Journey Room  > Look Good Feel Better  1st Wednesday of the month 10am-12 noon, Journey Room (Call [facility] to register [phone])   Levothyroxine tablets What is this medicine? LEVOTHYROXINE (lee voe thye ROX een) is a thyroid hormone. This medicine can improve symptoms of thyroid deficiency such as slow speech, lack of energy, weight gain, hair loss, dry skin, and feeling cold. It also helps to treat goiter (an enlarged thyroid gland). It is also used to treat some kinds of thyroid cancer along with surgery and other medicines. This medicine may be used for other purposes; ask your health care provider or pharmacist if you have questions. What should I tell my health care provider before I take this medicine? They need to know if you have any of these conditions: -angina -blood clotting  problems -diabetes -dieting or on a weight loss program -fertility problems -heart disease -high levels of thyroid hormone -pituitary gland problem -previous heart attack -an unusual or allergic reaction to levothyroxine, thyroid hormones, other medicines,  foods, dyes, or preservatives -pregnant or trying to get pregnant -breast-feeding How should I use this medicine? Take this medicine by mouth with plenty of water. It is best to take on an empty stomach, at least 30 minutes before or 2 hours after food. Follow the directions on the prescription label. Take at the same time each day. Do not take your medicine more often than directed. Contact your pediatrician regarding the use of this medicine in children. While this drug may be prescribed for children and infants as young as a few days of age for selected conditions, precautions do apply. For infants, you may crush the tablet and place in a small amount of (5-10 ml or 1 to 2 teaspoonfuls) of water, breast milk, or non-soy based infant formula. Do not mix with soy-based infant formula. Give as directed. Overdosage: If you think you have taken too much of this medicine contact a poison control center or emergency room at once. NOTE: This medicine is only for you. Do not share this medicine with others. What if I miss a dose? If you miss a dose, take it as soon as you can. If it is almost time for your next dose, take only that dose. Do not take double or extra doses. What may interact with this medicine? -amiodarone -antacids -anti-thyroid medicines -calcium supplements -carbamazepine -cholestyramine -colestipol -digoxin -female hormones, including contraceptive or birth control pills -iron supplements -ketamine -liquid nutrition products like Ensure -medicines for colds and breathing difficulties -medicines for diabetes -medicines for mental depression -medicines or herbals used to decrease weight or appetite -phenobarbital or other barbiturate medications -phenytoin -prednisone or other corticosteroids -rifabutin -rifampin -soy isoflavones -sucralfate -theophylline -warfarin This list may not describe all possible interactions. Give your health care provider a list of all the  medicines, herbs, non-prescription drugs, or dietary supplements you use. Also tell them if you smoke, drink alcohol, or use illegal drugs. Some items may interact with your medicine. What should I watch for while using this medicine? Be sure to take this medicine with plenty of fluids. Some tablets may cause choking, gagging, or difficulty swallowing from the tablet getting stuck in your throat. Most of these problems disappear if the medicine is taken with the right amount of water or other fluids. Do not switch brands of this medicine unless your health care professional agrees with the change. Ask questions if you are uncertain. You will need regular exams and occasional blood tests to check the response to treatment. If you are receiving this medicine for an underactive thyroid, it may be several weeks before you notice an improvement. Check with your doctor or health care professional if your symptoms do not improve. It may be necessary for you to take this medicine for the rest of your life. Do not stop using this medicine unless your doctor or health care professional advises you to. This medicine can affect blood sugar levels. If you have diabetes, check your blood sugar as directed. You may lose some of your hair when you first start treatment. With time, this usually corrects itself. If you are going to have surgery, tell your doctor or health care professional that you are taking this medicine. What side effects may I notice from receiving this medicine? Side effects that you should report  to your doctor or health care professional as soon as possible: -allergic reactions like skin rash, itching or hives, swelling of the face, lips, or tongue -chest pain -excessive sweating or intolerance to heat -fast or irregular heartbeat -nervousness -skin rash or hives -swelling of ankles, feet, or legs -tremors Side effects that usually do not require medical attention (report to your doctor or  health care professional if they continue or are bothersome): -changes in appetite -changes in menstrual periods -diarrhea -hair loss -headache -trouble sleeping -weight loss This list may not describe all possible side effects. Call your doctor for medical advice about side effects. You may report side effects to FDA at 1-800-FDA-1088. Where should I keep my medicine? Keep out of the reach of children. Store at room temperature between 15 and 30 degrees C (59 and 86 degrees F). Protect from light and moisture. Keep container tightly closed. Throw away any unused medicine after the expiration date. NOTE: This sheet is a summary. It may not cover all possible information. If you have questions about this medicine, talk to your doctor, pharmacist, or health care provider.    2016, [employer]. (2009-01-29 14:28:07)

## 2016-08-09 NOTE — Progress Notes (Signed)
[facility] NOTE  Patient Care Team: Gina Frederick. 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 Benjamine Frederick 12/21/2015 No obvious primary lesion noted on panendoscopy examination CT neck 12/17/2015 large infiltrative tumor mass extends to skin surface, infiltrates R patorid, R submandibular, R carotid sheath,Thrombosis of R IJ vein, multiple satellite nodules Centrilobular emphysema P16 negative Tobacco Abuse Port a cath/FT placement on 01/17/2016 Multiple dental extractions with Dr. Enrique Frederick 01/13/3016    Squamous cell carcinoma of head and neck ([place])   12/17/2015 Imaging    CT neck- large infiltrative tumor mass extends to skin surface, infiltrates R patorid, R submandibular, R carotid sheath,Thrombosis of R IJ vein, multiple satellite nodules      12/21/2015 Procedure    Soft tissue, biopsy, right neck mass by Dr. Benjamine Frederick  Direct laryngoscopy, rigid esophagoscopy, and bronchoscopy       12/21/2015 Pathology Results    - SQUAMOUS CELL CARCINOMA.  The carcinoma appears moderately to poorly differentiated.  P16 NEGATIVE.      12/28/2015 Initial Diagnosis    Squamous cell carcinoma of head and neck ([place])      01/03/2016 Imaging    CT CAP- Large right-sided neck mass. No evidence for lymphadenopathy or metastatic disease in the chest, abdomen, or pelvis. 9 mm ground-glass nodule in the posterior right lung apex is probably related to scarring given the underlying emphysema      01/04/2016 Imaging    Bone scan- No evidence of metastatic disease.      01/17/2016 Procedure    IR port and feeding tube placed      01/19/2016 PET scan    The dominant right neck mass is hypermetabolic.  In addition, there are hypermetabolic smaller cervical lymph nodes bilaterally.  No discrete lesion of the pharyngeal mucosal space identified. The dominant right neck mass and its hypermetabolic activi...      01/31/2016 -  Chemotherapy    Cisplatin      02/01/2016 - 03/28/2016 Radiation Therapy    70 Gy by Gina Frederick.      03/03/2016 - 03/10/2016 Hospital Admission    NSTEMI, ARF, CHF, Afib with RVR, respiratory failure with hypoxia      06/26/2016 PET scan    At least a significant partial metabolic response. Infiltrative mildly hypermetabolic right lateral neck mass is significantly decreased in size and metabolism. The residual mild hypermetabolism within the infiltrative right lateral neck mass is nonspecific and could represent post treatment change versus residual tumor. No additional residual hypermetabolic lymph nodes in the neck. No new hypermetabolic metastatic disease. New 3 mm right lower lobe pulmonary nodule, nonspecific and below PET resolution.       HISTORY OF PRESENTING ILLNESS:  Gina Frederick 60 y.o. 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   [name] is accompanied by female friend. I personally reviewed and went over laboratory studies with the patient.  She continues to smoke 2 cigarettes daily.   She is able to eat "all kinds of stuff" orally with plenty of fluids. "I've been living off of chicken noodle soup". She is able to eat vegetables pretty good, it is meat that she has trouble with. She also eats ice cream.  At supper if she cannot chew things she won't eat it. The patient notes she has an easier time swallowing through the day and her throat is more sore at night. She has met with Gina Frederick today regarding her nutritional status, they are working on increasing her caloric intake.   "I've been in pretty good spirits".  She denies any more chest pain. Her breathing is okay, other than being 'stopped  up' so her breath is a little short.   Notes she has not been able to get into contact with Gina Frederick. She does not currently have a follow up appointment scheduled on Epic with radiation. She notes that she was unaware that Gina Frederick works in [place] as well.   She is not on medicine for her thyroid.  She has had a flu shot this year.   MEDICAL HISTORY:  Past Medical History:  Diagnosis Date  . Arthritis   . Centrilobular emphysema ([place])   . Edentulous    Multiple dental extractions March 2017  . Essential hypertension   . Hyperlipidemia   . Ischemic cardiomyopathy    LVEF 40-45%  . NSTEMI (non-ST elevated myocardial infarction) Gina Frederick)    April 2017 - managed medically due to comorbid illnesses  . PAF (paroxysmal atrial fibrillation) ([place])   . 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 Whitney Frederick    SURGICAL HISTORY: Past Surgical History:  Procedure Laterality Date  . CESAREAN SECTION    . IR GENERIC HISTORICAL  08/07/2016   IR PATIENT EVAL TECH 0-60 MINS [facility]-INTERV RAD  . KNEE ARTHROSCOPY Right   . MASS BIOPSY Right 12/21/2015   Procedure: NECK MASS BIOPSY;  Surgeon: Gina Baptist, MD;  Location: [facility];  Service: ENT;  Laterality: Right;  . MULTIPLE EXTRACTIONS WITH ALVEOLOPLASTY N/A 01/13/2016   Procedure: EXTRACTION OF TOOTH #'S 6-12,15, 19-28, 30 WITH ALVEOLOPLASTY AND BILATERAL MAXILLARY TUBEROSITY REDUCTIONS;  Surgeon: Lenn Cal, DDS;  Location: [facility];  Service: Oral Surgery;  Laterality: N/A;  . PANENDOSCOPY N/A 12/21/2015   Procedure: PANENDOSCOPY;  Surgeon: Gina Baptist, MD;  Location: [facility];  Service: ENT;  Laterality: N/A;  . TONSILLECTOMY      SOCIAL HISTORY: Social History   Social History  . Marital status: Divorced    Spouse name: N/A  . Number of children: 1  . Years of education: N/A   Occupational History  . Not on file.   Social History Main Topics  . Smoking status: Former  Smoker    Packs/day: 0.25    Years: 20.00    Types: Cigarettes  . Smokeless tobacco: Never Used     Comment: She took a puff two days ago  . Alcohol use No  . Drug use: No  . Sexual activity: No   Other Topics Concern  . Not on file   Social History Narrative   Lives with a lady she takes care of.    She is divorced. One daughter, no grandchildren Smokes, notes that she is down to 3 a day. Began smoking "probably in her 63's." She says she has quit "on and off through the years." Denies problems with EtOH. 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 Quit working when her daughter was "of age to go to school," then stayed home with her daughter. For hobbies, she says she likes to read.  Is currently  the caregiver for a lady with diabetes.  She does light housekeeping and keeps her on her medications, and cooks. The son of the woman she cares for, Herbie Baltimore, notes that her good cooking has brought his momma's blood pressure and blood sugar down.  She notes she has a good support system with her sister, and with the son of the woman she takes care of.  FAMILY HISTORY: Family History  Problem Relation Age of Onset  . Alzheimer's disease Mother   . Congestive Heart Failure Father 20   indicated that her mother is deceased. She indicated that her father is deceased.    Mother and father are deceased. Mother was 38; died of Alzheimer's. Father was 63; congestive heart failure. Has a sister, age 63 this year. Had a brother, deceased. He took his own life. 4 nieces and nephews.  ALLERGIES:  is allergic to codeine.  MEDICATIONS:  Current Outpatient Prescriptions  Medication Sig Dispense Refill  . acetaminophen (TYLENOL) 500 MG tablet Take 1,000 mg by mouth every 6 (six) hours as needed for mild pain or moderate pain.     Marland Kitchen apixaban (ELIQUIS) 5 MG TABS tablet Take 1 tablet (5 mg total) by mouth 2 (two)  times daily. 60 tablet 3  . atorvastatin (LIPITOR) 40 MG tablet Take 1 tablet (40 mg total) by mouth daily at 6 PM. 30 tablet 3  . benzonatate (TESSALON) 100 MG capsule Take 1 capsule (100 mg total) by mouth 3 (three) times daily as needed for cough. 20 capsule 0  . diphenhydrAMINE (BENADRYL) 25 MG tablet Take 25 mg by mouth every 6 (six) hours as needed. Reported on 03/13/2016    . feeding supplement, ENSURE ENLIVE, (ENSURE ENLIVE) LIQD Take 237 mLs by mouth 2 (two) times daily between meals. 237 mL 0  . fluconazole (DIFLUCAN) 100 MG tablet Take 100 mg by mouth daily. Reported on 03/31/2016    . lidocaine-prilocaine (EMLA) cream Apply a quarter size amount to port site 1 hour prior to chemo. Do not rub in. Cover with plastic wrap. 30 g 3  . LORazepam (ATIVAN) 0.5 MG tablet Take 1 tablet (0.5 mg total) by mouth every 4 (four) hours as needed for anxiety (one tab every 4-6 hours as needed for nausea and vomiting). 45 tablet 1  . metoprolol (LOPRESSOR) 100 MG tablet Take 1 tablet (100 mg total) by mouth 2 (two) times daily. 60 tablet 3  . nitroGLYCERIN (NITROSTAT) 0.4 MG SL tablet Place 1 tablet (0.4 mg total) under the tongue every 5 (five) minutes x 3 doses as needed for chest pain. 15 tablet 0  . POT BICARB-POT CHLORIDE,25MEQ, 25 MEQ TBEF Take 1.6 tablets (40 mEq total) by mouth daily. 30 tablet 2  . prochlorperazine (COMPAZINE) 10 MG tablet Take 1 tablet (10 mg total) by mouth every 6 (six) hours as needed for nausea or vomiting. 30 tablet 2  . zolpidem (AMBIEN) 10 MG tablet Take 1 tablet (10 mg total) by mouth at bedtime as needed for sleep. 30 tablet 1   No current facility-administered medications for this visit.    Facility-Administered Medications Ordered in Other Visits  Medication Dose Route Frequency Provider Last Rate Last Dose  . sodium polystyrene (KAYEXALATE) 15 GM/60ML suspension 30 g  30 g Oral Once Baird Cancer, PA-C        Review of Systems  Constitutional: Positive for  weight loss (13 lbs since 06/28/2016). Negative for chills and fever.  HENT: Positive for congestion and sore throat (  more sore at night). Negative for hearing loss, nosebleeds and tinnitus.        Hoarse voice. Difficulty swallowing secondary to sore throat  Eyes: Negative.  Negative for blurred vision, double vision, pain and discharge.  Respiratory: Negative for hemoptysis, shortness of breath and wheezing.   Cardiovascular: Negative for chest pain, palpitations, claudication, leg swelling and PND.  Gastrointestinal: Negative for abdominal pain, blood in stool, constipation, diarrhea, heartburn, melena, nausea and vomiting.  Genitourinary: Negative.  Negative for dysuria, frequency, hematuria and urgency.  Musculoskeletal: Negative for falls, joint pain and myalgias.  Skin: Negative.  Negative for itching and rash.  Neurological: Negative for dizziness, tingling, tremors, sensory change, speech change, focal weakness, seizures, loss of consciousness and headaches.  Endo/Heme/Allergies: Negative.  Does not bruise/bleed easily.  Psychiatric/Behavioral: Negative for depression, memory loss, substance abuse and suicidal ideas. The patient is not nervous/anxious.   All other systems reviewed and are negative. 14 point ROS was done and is otherwise as detailed above or in HPI   PHYSICAL EXAMINATION: ECOG PERFORMANCE STATUS: 1 - Symptomatic but completely ambulatory   Vitals with BMI 08/09/2016  Height   Weight 119 lbs 3 oz  BMI   Systolic 675  Diastolic 79  Pulse 97  Respirations 18    Physical Exam  Constitutional: She is oriented to person, place, and time and well-developed, well-nourished, and in no distress.  Wears glasses.   HENT:  Head: Normocephalic and atraumatic.  Nose: Nose normal.  Mouth/Throat: No oropharyngeal exudate.  lymphedema   Eyes: Conjunctivae and EOM are normal. Pupils are equal, round, and reactive to light. Right eye exhibits no discharge. Left eye exhibits  no discharge. No scleral icterus.  Neck: Normal range of motion. Neck supple. No tracheal deviation present. No thyromegaly present.  R neck firm,  radiation induced skin changes, overall well healed She still has palpable mass in her neck  Cardiovascular: Normal rate, regular rhythm and normal heart sounds.  Exam reveals no gallop and no friction rub.   No murmur heard. Pulmonary/Chest: Effort normal and breath sounds normal. She has no wheezes. She has no rales.  Abdominal: Soft. Bowel sounds are normal. She exhibits no distension and no mass. There is no tenderness. There is no rebound and no guarding.  FT C/D/I  Musculoskeletal: Normal range of motion.  Lymphadenopathy:    She has no cervical adenopathy.  Neurological: She is alert and oriented to person, place, and time. She has normal reflexes. No cranial nerve deficit. Gait normal. Coordination normal.  Skin: Skin is warm and dry. No rash noted.  Psychiatric: Mood, memory, affect and judgment normal.  Nursing note and vitals reviewed.   LABORATORY DATA:  I have reviewed the data as listed  Lab Results  Component Value Date   WBC 4.9 08/09/2016   HGB 9.5 (L) 08/09/2016   HCT 29.9 (L) 08/09/2016   MCV 98.4 08/09/2016   PLT 255 08/09/2016   CMP     Component Value Date/Time   NA 131 (L) 08/09/2016 1017   K 4.2 08/09/2016 1017   CL 92 (L) 08/09/2016 1017   CO2 33 (H) 08/09/2016 1017   GLUCOSE 106 (H) 08/09/2016 1017   BUN 15 08/09/2016 1017   CREATININE 1.33 (H) 08/09/2016 1017   CALCIUM 9.6 08/09/2016 1017   PROT 6.5 08/09/2016 1017   ALBUMIN 3.7 08/09/2016 1017   AST 18 08/09/2016 1017   ALT 13 (L) 08/09/2016 1017   ALKPHOS 104 08/09/2016 1017   BILITOT  0.5 08/09/2016 1017   GFRNONAA 42 (L) 08/09/2016 1017   GFRAA 49 (L) 08/09/2016 1017     RADIOGRAPHIC STUDIES: I have personally reviewed the radiological images as listed and agreed with the findings in the report. Result Narrative   Patient came in with  complaint of leaking g tube as a result of cutting it. It was noted to be missing the g tube feeding adapter. This was replaced but there was an additional cut about 2 inches from the insertion site. The correct tube size to replace her catheter without leaking was not in stock. The catheter was taped and secured across the cut. Several test flushes were preformed with no leaking. The patient was advised that the g tube was functional but needed to be replaced soon. We anticipate the catheter in stock within the next day. The patient stated she could not come back at that time but would call to come in within the next couple weeks. I further advised her not to use any scissors around the catheter and to try not to remove the tape placed on the tube today.    Study Result   CLINICAL DATA:  Subsequent treatment strategy for squamous cell carcinoma of the right neck status post chemotherapy and radiation therapy. Restaging.  EXAM: NUCLEAR MEDICINE PET SKULL BASE TO THIGH  TECHNIQUE: 6.3 mCi F-18 FDG was injected intravenously. Full-ring PET imaging was performed from the skull base to thigh after the radiotracer. CT data was obtained and used for attenuation correction and anatomic localization.  FASTING BLOOD GLUCOSE:  Value: 117 mg/dl  COMPARISON:  01/19/2016 PET-CT.  FINDINGS: NECK  Infiltrative right lateral neck mass centered at the level of the inferior hyoid bone measures 4.2 x 3.2 cm (series 4/ image 36) with mild residual hypermetabolism with max SUV 5.5, previously 7.0 x 6.8 cm with max SUV 20.7, significantly decreased in size and metabolism.  No additional hypermetabolic masses / lymph nodes remain in the neck. No discrete hypermetabolic mass in the pharynx or larynx.  Mucoperiosteal thickening in the right maxillary sinus.  CHEST  No hypermetabolic axillary, mediastinal or hilar nodes. Left internal jugular MediPort terminates at the cavoatrial  junction. Right coronary atherosclerosis. Atherosclerotic nonaneurysmal thoracic aorta. Trace right pleural effusion. No left pleural effusion. Moderate centrilobular emphysema and mild diffuse bronchial wall thickening. There is new interlobular septal thickening and patchy ground-glass opacity at the right greater than left lung apices with associated mild hypermetabolism with max SUV 3.7, favor evolving radiation change. New 3 mm solid anterior right lower lobe pulmonary nodule (series 8/image 30), below PET resolution. Thick parenchymal band in the basilar right lower lobe is new and non hypermetabolic, most consistent with atelectasis and/or evolving postinfectious/postinflammatory scarring.  ABDOMEN/PELVIS  No abnormal hypermetabolic activity within the liver, pancreas, adrenal glands, or spleen. No hypermetabolic lymph nodes in the abdomen or pelvis. Cholelithiasis. Percutaneous gastrostomy tube is in place in the gastric body. Marked sigmoid diverticulosis.  SKELETON  No focal hypermetabolic activity to suggest skeletal metastasis.  IMPRESSION: 1. At least a significant partial metabolic response. Infiltrative mildly hypermetabolic right lateral neck mass is significantly decreased in size and metabolism. The residual mild hypermetabolism within the infiltrative right lateral neck mass is nonspecific and could represent post treatment change versus residual tumor. No additional residual hypermetabolic lymph nodes in the neck. No new hypermetabolic metastatic disease. 2. New 3 mm right lower lobe pulmonary nodule, nonspecific and below PET resolution. Recommend attention on a follow-up chest CT in 3-6 months.  3. New septal thickening and patchy ground-glass opacity at the lung apices with associated mild hypermetabolism, favor evolving radiation change. 4. Additional findings include aortic atherosclerosis, trace right pleural effusion, moderate emphysema,  cholelithiasis and marked sigmoid diverticulosis.   Electronically Signed   By: Ilona Sorrel M.D.   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 Benjamine Frederick 12/21/2015 No obvious primary lesion noted on panendoscopy examination CT neck 12/17/2015 large infiltrative tumor mass extends to skin surface, infiltrates R patorid, R submandibular, R carotid sheath,Thrombosis of R IJ vein, multiple satellite nodules Centrilobular emphysema P16 negative Tobacco Use Tobacco Abuse Port a cath/FT placement on 01/17/2016 Multiple dental extractions with Dr. Enrique Frederick 01/13/3016 Hyponatremia HTN NSTEMI Macrocytosis, normal B12, folate Anemia Hypothyroidism.  She is scheduled for follow-up PET/CT on 09/25/2016. We will make sure that she has followup with Gina Frederick.   She has drastically decreased her smoking, we have discussed completely quitting smoking.   Labs reviewed. TSH is 6.462 today. Based on her high TSH level, I have written her a prescription for levothyroxine. Will recheck her TSH in 6 weeks. Anemia is chronic but stable. B12 and folate were checked earlier this year and WNL. Iron studies have been acceptable. Will consider rechecking moving forward as well.   Recent weight loss of 13 lbs since 06/28/2016. She met with Gina Frederick today.  She is scheduled for follow up with Dr. Domenic Polite on 09/04/2016.  She will return for follow up post PET to discuss these results.   All questions were answered. The patient knows to call the clinic with any problems, questions or concerns.  This document serves as a record of services personally performed by Ancil Linsey, MD. It was created on her behalf by Arlyce Harman, a trained medical scribe. The creation of this record is based on the scribe's personal observations and the  provider's statements to them. This document has been checked and approved by the attending provider.  I have reviewed the above documentation for accuracy and completeness, and I agree with the above.  This note was electronically signed.  Molli Hazard, MD  08/09/2016 11:36 AM

## 2016-08-09 NOTE — Progress Notes (Signed)
Follow up with H&N patient s/p Chemoradiation. Still dependant on Tube feedings  Contacted Pt by visiting prior to visit with MD   Wt Readings from Last 10 Encounters:  08/09/16 119 lb 3.2 oz (54.1 kg)  06/28/16 132 lb (59.9 kg)  05/26/16 122 lb (55.3 kg)  05/19/16 124 lb 14.4 oz (56.7 kg)  04/14/16 127 lb 9.6 oz (57.9 kg)  04/07/16 132 lb 12.8 oz (60.2 kg)  03/31/16 139 lb 1.6 oz (63.1 kg)  03/10/16 142 lb 3.2 oz (64.5 kg)  03/03/16 146 lb (66.2 kg)  03/02/16 143 lb 6.4 oz (65 kg)   Unfortunately, pt has lost an astounding amount of weight in just the 6 weeks. This is very discouraging. Of note, Did have some suspicion that her weight at last appointment was an error as it was a large outlier. Removing that weight from trend, she appears to be losing 3-5 lbs a month  She has had some recent trouble with her PEG tube. She cut it with scissors on accident. It is not quite clear how long pt went w/o tube feeding as patient and the individual with whom she resides  report differing time estimates. The pt thinks it was only a few days, but the other individual believes it has been "weeks". Pt admits to not being good with time and then says the incident occured  "around the time of the ED visit". Do not see any documentation of ED encounter recently.   Prior to this incident, pt says she was doing her same routine regimen of 4 cans Osmolite 1.5, administered in 4 feeds, with 120 cc flush before and after.   TF regimen provides 1422 kcals, 60 g Pro, 724 ml fluid w/ additional 960 mls from flushes.   Despite her weight loss, she actually reports a voracious appetite and is always hungry. She says she can drink liquids fine, but has some issues with food. She has trouble chewing It takes her a long time to eat as she will typically break the food apart with her hands and then place it in her mouth. She feels that her neck mass is worsening. Subjectively, she does sound more dysarthric than she has  in the past and was hard to understand at times.   RD asked her living mate for her input and she painted a different picture. She stated patient will take a few bites at dinner, get frustrated about how long it takes her, and stop eating. Pt notes "Patience is not one of my virtues". As noted earlier, the living mate also believed the length of time pt went without tube feeding was much longer.   It sounds that patient has much need in terms of speech therapy. She says she does have an upcoming appointment with [facility].    Have been hesitant to increase TF regimen as pt is nearly 5 months out from treatment and is able to eat, albeit slowly. However, it has become apparent that pt is not eating enough or progressing in regards to her ability to take PO intake. Therefore, we discussed changes to TF regimen.   RD initially recommended changing her TF to 2calHN which would provide extra 550 kcals. Did note this is a thicker formula and she may need to dilute it. She was not  interested in changing TF formulas "I have no problems tolerating the osmolite.Marland KitchenMarland KitchenI dont have to dilute my formula now". During this discussion it became apparent that pt does not like taking time  with her TF; she boluses her TF with a plunger rather than letting in go slowly with gravity. She says "I like to get it over with". This is relevant as there has been suspected noncompliance with her TF regimen as her rate of wt loss and reported good po intake + TF intake do not add up.   Pt has been limited to 1 can feeds. She has been flushing with ~240 ccs at each meal as well. Because pt reports no trouble with liquids and reports good fluid intake, RD recommended decreasing her flushes to 60 cc before and after and to substitute 1/4 of an additional can of osmolite 1.5 at each of her 4 feeds. This would provide 1778 kcals , 75 g Pro and 905 mls fluid + 480 from flushes.   RD spent considerable time educating her on the new feeding  regimen. Because she is infusing only an additional 2 oz of an additional can, she will need to refrigerate the remainder until the next feed. She is told to let can sit 5-10 minutes prior to infusing 2 oz of this can. She understands that at the end of the day, she should not have any excess TF to refrigerate. This new plan does not change the number of feeds and is a volume reduction. She is given written instructions on the new regimen.  She is educated that she needs to make sure to drink fluids well. She says this wont be a problem. She is told the s/s of dehydration.   Pt's tube is being held together with tape and pt had been advised that she will need her tube to be replaced. Pt is not interested at this time with getting her tube replaced (she heard it was painful) and wants to "wait on it a few weeks". Discouraged this as it sounds pt has a habit of putting things off because she does not want to take the time to get them fixed, such as what happened when she cut her tube with scissors.    RD will continue to monitor and call in a couple weeks time to see how she is doing.   Burtis Junes RD, LDN, [facility] Pager: [phone] 08/09/2016 2:58 PM

## 2016-08-10 ENCOUNTER — Encounter (HOSPITAL_COMMUNITY): Payer: Self-pay | Admitting: Hematology & Oncology

## 2016-08-14 ENCOUNTER — Telehealth (HOSPITAL_COMMUNITY): Payer: Self-pay | Admitting: Speech Pathology

## 2016-08-14 ENCOUNTER — Encounter (HOSPITAL_COMMUNITY): Payer: Self-pay | Admitting: Speech Pathology

## 2016-08-14 ENCOUNTER — Ambulatory Visit (HOSPITAL_COMMUNITY): Payer: Medicaid Other | Attending: Hematology & Oncology | Admitting: Speech Pathology

## 2016-08-14 NOTE — Telephone Encounter (Signed)
Speech Pathology  Telephone call placed to [name] due to her missed SLP appointment this date. Pt stated that she was sorry, but she's had a lot going on. She was rescheduled for next Monday, 08/21/16 at 2:30 PM.   Thank you,  Genene Churn, [place]

## 2016-08-21 ENCOUNTER — Ambulatory Visit (HOSPITAL_COMMUNITY): Payer: Medicaid Other | Admitting: Speech Pathology

## 2016-08-21 ENCOUNTER — Telehealth (HOSPITAL_COMMUNITY): Payer: Self-pay

## 2016-08-21 NOTE — Telephone Encounter (Signed)
08/21/16 pt called this morning to cx - she said she took some nitroglycerin tablets yesterday and wasn't feeling very well

## 2016-08-24 ENCOUNTER — Other Ambulatory Visit (HOSPITAL_COMMUNITY): Payer: Self-pay

## 2016-08-24 DIAGNOSIS — G4700 Insomnia, unspecified: Secondary | ICD-10-CM

## 2016-08-24 MED ORDER — ZOLPIDEM TARTRATE 10 MG PO TABS: 10.0000 mg | ORAL_TABLET | Freq: Every evening | ORAL | 1 refills | Status: DC | PRN

## 2016-08-24 NOTE — Telephone Encounter (Signed)
Received refill request from patients pharmacy for ambien. Chart checked and refilled.

## 2016-08-28 ENCOUNTER — Telehealth (HOSPITAL_COMMUNITY): Payer: Self-pay

## 2016-08-28 ENCOUNTER — Ambulatory Visit (HOSPITAL_COMMUNITY): Payer: Medicaid Other | Admitting: Speech Pathology

## 2016-08-28 NOTE — Telephone Encounter (Signed)
Patient's appt was canceled . Medicaid expired and was re-submitted.

## 2016-08-29 ENCOUNTER — Encounter: Payer: Self-pay | Admitting: Dietician

## 2016-08-29 NOTE — Progress Notes (Signed)
Follow up with H&N patient s/p Chemoradiation. Still dependant on Tube feedings  RD stated at her last office visit he would call to check in given pt's continued weight loss. Contacted Pt by calling house.   Wt Readings from Last 10 Encounters:  08/09/16 119 lb 3.2 oz (54.1 kg)  06/28/16 132 lb (59.9 kg)  05/26/16 122 lb (55.3 kg)  05/19/16 124 lb 14.4 oz (56.7 kg)  04/14/16 127 lb 9.6 oz (57.9 kg)  04/07/16 132 lb 12.8 oz (60.2 kg)  03/31/16 139 lb 1.6 oz (63.1 kg)  03/10/16 142 lb 3.2 oz (64.5 kg)  03/03/16 146 lb (66.2 kg)  03/02/16 143 lb 6.4 oz (65 kg)   RD asked pt if she had weighed herself recently. She says that last time she weighed herself was 2 weeks ago and she was 134 lbs. - Unless her weight in the office was a misweight, this is likely not accurate as that would mean she gained 15 lbs in 10 days. She does have a hx of CHF. Will mention this to PA.   Unfortunately, despite prolonged education and reinforcement at last office visit, she is not doing the regimen we had discussed. She says she is doing 5 syringes of TF 3x a day. This equates to 3.75 cans of TF. She was asked to do this 4x a day. The total volume is less than what she had been doing (1 can 4x a day)  She also is still flushing with 120 cc before and after her feeds. She had been instructed to decrease this to 1 syringe because she reported good fluid intake and to help reduce volume during feedings.   RD reiterated she did not need to flush with 120 before and after. She was glad to hear this because it "gets to be a lot at night".   Patient reports oral intake as worse than when she was seen a couple weeks back. She believes this is due to her receiving more feedings through the tube since she was seen. However, during that office visit she reported doing 4 cans/day which is more than what she reports she is receiving now.   She says she got her PEG tube fixed and "It made a world of difference".   Despite  infusing less TF and eating less than she had prior she reports weight gain.  She says she feels stronger and better overall.  -Pt is historically a very poor historian and it has always been difficult to ascertain her exact TF feeding/eating habits.   RD encouraged her to continue to try to increase PO intake. Asked her to try to get the forth feed in if she can.   RD stated he would again follow up in 2 weeks.   Burtis Junes RD, LDN, [facility] Pager: [phone] 08/29/2016 5:28 PM

## 2016-09-01 NOTE — Progress Notes (Signed)
Cardiology Office Note  Date: 09/04/2016   ID: Gina Frederick, DOB November 15, 1955, MRN [id number]  PCP: Raiford Simmonds., PA-C  Primary Cardiologist: Rozann Lesches, MD   Chief Complaint  Patient presents with  . Cardiomyopathy  . Atrial Fibrillation    History of Present Illness: Gina Frederick is a medically complex 60 y.o. female last seen in July. She is here for a follow-up visit. She reports no chest pain or palpitations. She has had problems with weight loss and has been seen by Nutrition, has tube feeds.  She continues to follow with Dr. 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  We went over her medications today. She ran out of Eliquis about a week ago and we have refilled this. It also seems that she has not been taking Lopressor, possibly for a few months based on a call to her pharmacy. Fortunately, heart rate is better than it was at the last visit and her blood pressure is also stable.  He had a follow-up echocardiogram in July showing relatively stable LVEF at 40%.  Past Medical History:  Diagnosis Date  . Arthritis   . Centrilobular emphysema ([place])   . Edentulous    Multiple dental extractions March 2017  . Essential hypertension   . Hyperlipidemia   . Ischemic cardiomyopathy    LVEF 40-45%  . NSTEMI (non-ST elevated myocardial infarction) [facility])    April 2017 - managed medically due to comorbid illnesses  . PAF (paroxysmal atrial fibrillation) ([place])   . 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 Whitney Muse    Past Surgical History:  Procedure Laterality Date  . CESAREAN SECTION    . IR GENERIC HISTORICAL  08/07/2016   IR PATIENT EVAL TECH 0-60 MINS [facility]-INTERV RAD  . KNEE ARTHROSCOPY Right   . MASS BIOPSY Right 12/21/2015   Procedure: NECK MASS BIOPSY;  Surgeon: Leta Baptist, MD;  Location: [facility];  Service: ENT;   Laterality: Right;  . MULTIPLE EXTRACTIONS WITH ALVEOLOPLASTY N/A 01/13/2016   Procedure: EXTRACTION OF TOOTH #'S 6-12,15, 19-28, 30 WITH ALVEOLOPLASTY AND BILATERAL MAXILLARY TUBEROSITY REDUCTIONS;  Surgeon: Lenn Cal, DDS;  Location: [facility];  Service: Oral Surgery;  Laterality: N/A;  . PANENDOSCOPY N/A 12/21/2015   Procedure: PANENDOSCOPY;  Surgeon: Leta Baptist, MD;  Location: [facility];  Service: ENT;  Laterality: N/A;  . TONSILLECTOMY      Current Outpatient Prescriptions  Medication Sig Dispense Refill  . acetaminophen (TYLENOL) 500 MG tablet Take 1,000 mg by mouth every 6 (six) hours as needed for mild pain or moderate pain.     Marland Kitchen apixaban (ELIQUIS) 5 MG TABS tablet Take 1 tablet (5 mg total) by mouth 2 (two) times daily. 60 tablet 3  . atorvastatin (LIPITOR) 40 MG tablet Take 1 tablet (40 mg total) by mouth daily at 6 PM. 30 tablet 3  . benzonatate (TESSALON) 100 MG capsule Take 1 capsule (100 mg total) by mouth 3 (three) times daily as needed for cough. 20 capsule 0  . diphenhydrAMINE (BENADRYL) 25 MG tablet Take 25 mg by mouth every 6 (six) hours as needed. Reported on 03/13/2016    . feeding supplement, ENSURE ENLIVE, (ENSURE ENLIVE) LIQD Take 237 mLs by mouth 2 (two) times daily between meals. 237 mL 0  . levothyroxine (SYNTHROID, LEVOTHROID) 25 MCG tablet Take 1 tablet (25  mcg total) by mouth daily before breakfast. 30 tablet 1  . lidocaine-prilocaine (EMLA) cream Apply a quarter size amount to port site 1 hour prior to chemo. Do not rub in. Cover with plastic wrap. 30 g 3  . LORazepam (ATIVAN) 0.5 MG tablet Take 1 tablet (0.5 mg total) by mouth every 4 (four) hours as needed for anxiety (one tab every 4-6 hours as needed for nausea and vomiting). 45 tablet 1  . nitroGLYCERIN (NITROSTAT) 0.4 MG SL tablet Place 1 tablet (0.4 mg total) under the tongue every 5 (five) minutes x 3 doses as needed for chest pain. 15 tablet 0  . prochlorperazine (COMPAZINE) 10 MG tablet Take  1 tablet (10 mg total) by mouth every 6 (six) hours as needed for nausea or vomiting. 30 tablet 2  . zolpidem (AMBIEN) 10 MG tablet Take 1 tablet (10 mg total) by mouth at bedtime as needed for sleep. 30 tablet 1  . metoprolol succinate (TOPROL XL) 25 MG 24 hr tablet Take 1 tablet (25 mg total) by mouth daily. 90 tablet 3  . POT BICARB-POT CHLORIDE,25MEQ, 25 MEQ TBEF Take 1.6 tablets (40 mEq total) by mouth daily. 30 tablet 2   No current facility-administered medications for this visit.    Facility-Administered Medications Ordered in Other Visits  Medication Dose Route Frequency Provider Last Rate Last Dose  . sodium polystyrene (KAYEXALATE) 15 GM/60ML suspension 30 g  30 g Oral Once [facility], PA-C       Allergies:  Codeine   Social History: The patient  reports that she has been smoking Cigarettes.  She has a 2.00 pack-year smoking history. She has never used smokeless tobacco. She reports that she does not drink alcohol or use drugs.   ROS:  Please see the history of present illness. Otherwise, complete review of systems is positive for fatigue and early satiety.  All other systems are reviewed and negative.   Physical Exam: VS:  BP 124/76   Pulse 64   Ht 5\' 4"  ([id number] m)   Wt 130 lb (59 kg)   SpO2 91%   BMI 22.31 kg/m , BMI Body mass index is 22.31 kg/m.  Wt Readings from Last 3 Encounters:  09/04/16 130 lb (59 kg)  08/09/16 119 lb 3.2 oz (54.1 kg)  06/28/16 132 lb (59.9 kg)    General: Chronically ill-appearing woman in no distress. HEENT: Conjunctiva and lids normal, mass right side of neck. Neck: Supple, obvious elevated JVP or carotid bruits. Lungs: Decreased breath sounds without wheezing, nonlabored breathing at rest. Cardiac: Regular rate and rhythm, no S3, soft systolic murmur, no pericardial rub. Abdomen: Soft, nontender, bowel sounds present, no guarding or rebound. Extremities: No pitting edema, distal pulses 1-2+. Skin: Warm and dry. Musculoskeletal: No  kyphosis. Neuropsychiatric: Alert and oriented x3, affect grossly appropriate.  ECG: I personally reviewed the tracing from 03/06/2016 which showed sinus tachycardia with nonspecific ST-T changes.  Recent Labwork: 03/08/2016: B Natriuretic Peptide >4,500.0 04/14/2016: Magnesium 1.9 08/09/2016: ALT 13; AST 18; BUN 15; Creatinine, Ser 1.33; Hemoglobin 9.5; Platelets 255; Potassium 4.2; Sodium 131; TSH 6.462   Other Studies Reviewed Today:  Echocardiogram 06/01/2016: Study Conclusions  - Left ventricle: The cavity size was normal. Wall thickness was   increased in a pattern of mild LVH. Systolic function was   moderately reduced. The estimated ejection fraction was   approximately 40%. Features are consistent with a pseudonormal   left ventricular filling pattern, with concomitant abnormal   relaxation and increased  filling pressure (grade 2 diastolic   dysfunction). Doppler parameters are consistent with high   ventricular filling pressure. - Regional wall motion abnormality: Severe hypokinesis of the mid   inferoseptal, basal-mid inferior, and basal-mid inferolateral   myocardium. - Aortic valve: Mildly to moderately calcified annulus. Trileaflet.   There was trivial regurgitation. - Mitral valve: There was moderate regurgitation. - Left atrium: The atrium was moderately dilated. - Tricuspid valve: There was moderate regurgitation.  Assessment and Plan:  1. Paroxysmal atrial fibrillation, maintaining sinus rhythm. Plan is to continue Eliquis, recent lab work reviewed, she has had no bleeding problems.  2. Ischemic cardiomyopathy with LVEF approximately 40%. Plan to resume beta blocker in the form of Toprol-XL 25 mg daily and follow. Depending on blood pressure may be able to add low dose ARB. Otherwise anticipate conservative management.  3. Essential hypertension, blood pressure is adequately controlled today.  4. Hyperlipidemia, continues on Lipitor.  Current medicines were  reviewed with the patient today.  Disposition: Follow-up in 4 months.   Signed, Satira Sark, MD, [facility] 09/04/2016 11:38 AM    [facility] [place]. [address], [place], [place] [address] Phone: [phone]; Fax: [phone]

## 2016-09-04 ENCOUNTER — Encounter: Payer: Self-pay | Admitting: Cardiology

## 2016-09-04 ENCOUNTER — Ambulatory Visit (INDEPENDENT_AMBULATORY_CARE_PROVIDER_SITE_OTHER): Payer: Medicaid Other | Admitting: Cardiology

## 2016-09-04 VITALS — BP 124/76 | HR 64 | Ht 64.0 in | Wt 130.0 lb

## 2016-09-04 DIAGNOSIS — I480 Paroxysmal atrial fibrillation: Secondary | ICD-10-CM | POA: Diagnosis not present

## 2016-09-04 DIAGNOSIS — I10 Essential (primary) hypertension: Secondary | ICD-10-CM

## 2016-09-04 DIAGNOSIS — E782 Mixed hyperlipidemia: Secondary | ICD-10-CM | POA: Diagnosis not present

## 2016-09-04 DIAGNOSIS — I255 Ischemic cardiomyopathy: Secondary | ICD-10-CM | POA: Diagnosis not present

## 2016-09-04 MED ORDER — METOPROLOL SUCCINATE ER 25 MG PO TB24: 25.0000 mg | ORAL_TABLET | Freq: Every day | ORAL | 3 refills | Status: DC

## 2016-09-04 MED ORDER — APIXABAN 5 MG PO TABS: 5.0000 mg | ORAL_TABLET | Freq: Two times a day (BID) | ORAL | 3 refills | Status: AC

## 2016-09-04 NOTE — Patient Instructions (Signed)
Your physician wants you to follow-up in: 4 months Dr Ferne Reus will receive a reminder letter in the mail two months in advance. If you don't receive a letter, please call our office to schedule the follow-up appointment.   START Toprol XL 25 mg daily  If you need a refill on your cardiac medications before your next appointment, please call your pharmacy.   Thank you for choosing [facility] !

## 2016-09-05 ENCOUNTER — Ambulatory Visit (HOSPITAL_COMMUNITY): Payer: Medicaid Other | Admitting: Speech Pathology

## 2016-09-05 ENCOUNTER — Telehealth (HOSPITAL_COMMUNITY): Payer: Self-pay | Admitting: *Deleted

## 2016-09-05 DIAGNOSIS — Z95828 Presence of other vascular implants and grafts: Secondary | ICD-10-CM

## 2016-09-05 DIAGNOSIS — C4442 Squamous cell carcinoma of skin of scalp and neck: Secondary | ICD-10-CM

## 2016-09-05 DIAGNOSIS — C760 Malignant neoplasm of head, face and neck: Secondary | ICD-10-CM

## 2016-09-05 MED ORDER — PROCHLORPERAZINE MALEATE 10 MG PO TABS: 10.0000 mg | ORAL_TABLET | Freq: Four times a day (QID) | ORAL | 2 refills | Status: AC | PRN

## 2016-09-05 NOTE — Telephone Encounter (Signed)
Spoke with pt and she states that everything had already been taken care of and the medication had already been sent in.

## 2016-09-08 ENCOUNTER — Telehealth (HOSPITAL_COMMUNITY): Payer: Self-pay

## 2016-09-08 NOTE — Telephone Encounter (Signed)
Patients friend , Megan Salon, called stating he is concerned that patient is taking more medication than she should be. He states she is supposed to take care of his elderly mother and lives in the house with them. However, recently he has noticed she seems to be in a "zombie state" for 4-5 days at a time and then she gets better for a week then she goes back to the "zombie state" for 4-5 days. While she is in this state, she sleeps at least 20 hours per day. He states patient says she is not taking anything other than what is prescribed but he does not believe her. He wants her to get "some help". After reviewing with PA-C, I called Mr. Trinna Balloon back and left message explaining she is not abusing any medications that we are prescribing for her. Her refills are on schedule, never before. Instructed him to follow up with her PCP or possibly he could call adult protective services if he was that concerned. Instructed him to call cancer center if he had any further problems or questions.

## 2016-09-08 NOTE — Telephone Encounter (Signed)
-----   Message from Darlina Guys, LPN sent at [id number]  2:14 PM EDT ----- Regarding: FW: concern Attempted to call Megan Salon and was unable to reach him at number provided. Left message for him to call cancer center back if he still needs to talk to [place].  Meredeth Ide ----- Message ----- From: Louis Meckel Sent: 09/04/2016   2:11 PM To: Onc Nurse Ap Subject: concern                                        Megan Salon called about patient she lives with him and he is on her contact list.  He called and is concerned that she is abusing per pill and needs to talk to someone about it.  Please call him at [phone]  Thanks

## 2016-09-11 ENCOUNTER — Other Ambulatory Visit (HOSPITAL_COMMUNITY): Payer: Self-pay | Admitting: Hematology & Oncology

## 2016-09-11 DIAGNOSIS — E039 Hypothyroidism, unspecified: Secondary | ICD-10-CM

## 2016-09-19 ENCOUNTER — Other Ambulatory Visit (HOSPITAL_COMMUNITY): Payer: Self-pay

## 2016-09-19 ENCOUNTER — Ambulatory Visit (HOSPITAL_COMMUNITY): Payer: Self-pay | Admitting: Hematology & Oncology

## 2016-09-19 ENCOUNTER — Telehealth (HOSPITAL_COMMUNITY): Payer: Self-pay

## 2016-09-19 DIAGNOSIS — R112 Nausea with vomiting, unspecified: Secondary | ICD-10-CM

## 2016-09-19 MED ORDER — ONDANSETRON HCL 4 MG PO TABS: 4.0000 mg | ORAL_TABLET | Freq: Three times a day (TID) | ORAL | 0 refills | Status: DC | PRN

## 2016-09-19 NOTE — Telephone Encounter (Signed)
Patient called stating the compazine was too strong for her and made her confused. After reviewing with oncologist, prescription for Zofran sent to patients pharmacy. Also instructed patient to go to ER if her confusion continued or became worse. She stated she was not confused at this time and verbalized understanding.

## 2016-09-20 ENCOUNTER — Other Ambulatory Visit (HOSPITAL_COMMUNITY): Payer: Self-pay

## 2016-09-20 DIAGNOSIS — C760 Malignant neoplasm of head, face and neck: Secondary | ICD-10-CM

## 2016-09-20 MED ORDER — PROCHLORPERAZINE MALEATE 5 MG PO TABS: 5.0000 mg | ORAL_TABLET | Freq: Four times a day (QID) | ORAL | 1 refills | Status: DC | PRN

## 2016-09-25 ENCOUNTER — Encounter (HOSPITAL_COMMUNITY): Payer: Medicaid Other

## 2016-09-26 ENCOUNTER — Other Ambulatory Visit (HOSPITAL_COMMUNITY): Payer: Self-pay

## 2016-09-26 ENCOUNTER — Ambulatory Visit (HOSPITAL_COMMUNITY): Payer: Self-pay | Admitting: Hematology & Oncology

## 2016-09-26 ENCOUNTER — Encounter: Payer: Self-pay | Admitting: Dietician

## 2016-09-26 NOTE — Progress Notes (Signed)
Opened in error

## 2016-10-02 NOTE — Progress Notes (Signed)
This encounter was created in error - please disregard.

## 2016-10-09 ENCOUNTER — Other Ambulatory Visit (HOSPITAL_COMMUNITY): Payer: Self-pay | Admitting: Hematology & Oncology

## 2016-10-09 DIAGNOSIS — R112 Nausea with vomiting, unspecified: Secondary | ICD-10-CM

## 2016-10-13 ENCOUNTER — Observation Stay (HOSPITAL_COMMUNITY): Payer: Medicaid Other

## 2016-10-13 ENCOUNTER — Encounter (HOSPITAL_COMMUNITY): Payer: Self-pay | Admitting: Emergency Medicine

## 2016-10-13 ENCOUNTER — Emergency Department (HOSPITAL_COMMUNITY): Payer: Medicaid Other

## 2016-10-13 ENCOUNTER — Encounter (HOSPITAL_COMMUNITY): Admission: RE | Admit: 2016-10-13 | Payer: Medicaid Other | Source: Ambulatory Visit

## 2016-10-13 ENCOUNTER — Inpatient Hospital Stay (HOSPITAL_COMMUNITY)
Admission: EM | Admit: 2016-10-13 | Discharge: 2016-10-19 | DRG: 291 | Disposition: A | Discharge status: Skilled Nursing Facility | Payer: Medicaid Other | Attending: Family Medicine | Admitting: Family Medicine

## 2016-10-13 DIAGNOSIS — Z79899 Other long term (current) drug therapy: Secondary | ICD-10-CM

## 2016-10-13 DIAGNOSIS — I252 Old myocardial infarction: Secondary | ICD-10-CM

## 2016-10-13 DIAGNOSIS — Z9981 Dependence on supplemental oxygen: Secondary | ICD-10-CM

## 2016-10-13 DIAGNOSIS — R0600 Dyspnea, unspecified: Secondary | ICD-10-CM

## 2016-10-13 DIAGNOSIS — Z9221 Personal history of antineoplastic chemotherapy: Secondary | ICD-10-CM

## 2016-10-13 DIAGNOSIS — R0902 Hypoxemia: Secondary | ICD-10-CM

## 2016-10-13 DIAGNOSIS — N189 Chronic kidney disease, unspecified: Secondary | ICD-10-CM | POA: Diagnosis present

## 2016-10-13 DIAGNOSIS — R0602 Shortness of breath: Secondary | ICD-10-CM

## 2016-10-13 DIAGNOSIS — I5043 Acute on chronic combined systolic (congestive) and diastolic (congestive) heart failure: Secondary | ICD-10-CM | POA: Diagnosis present

## 2016-10-13 DIAGNOSIS — L89152 Pressure ulcer of sacral region, stage 2: Secondary | ICD-10-CM | POA: Diagnosis present

## 2016-10-13 DIAGNOSIS — Z7901 Long term (current) use of anticoagulants: Secondary | ICD-10-CM

## 2016-10-13 DIAGNOSIS — Z720 Tobacco use: Secondary | ICD-10-CM | POA: Diagnosis present

## 2016-10-13 DIAGNOSIS — L8990 Pressure ulcer of unspecified site, unspecified stage: Secondary | ICD-10-CM | POA: Insufficient documentation

## 2016-10-13 DIAGNOSIS — Z923 Personal history of irradiation: Secondary | ICD-10-CM

## 2016-10-13 DIAGNOSIS — C4442 Squamous cell carcinoma of skin of scalp and neck: Secondary | ICD-10-CM | POA: Diagnosis present

## 2016-10-13 DIAGNOSIS — F17210 Nicotine dependence, cigarettes, uncomplicated: Secondary | ICD-10-CM | POA: Diagnosis present

## 2016-10-13 DIAGNOSIS — N179 Acute kidney failure, unspecified: Secondary | ICD-10-CM | POA: Diagnosis present

## 2016-10-13 DIAGNOSIS — D631 Anemia in chronic kidney disease: Secondary | ICD-10-CM | POA: Diagnosis present

## 2016-10-13 DIAGNOSIS — C760 Malignant neoplasm of head, face and neck: Secondary | ICD-10-CM

## 2016-10-13 DIAGNOSIS — J449 Chronic obstructive pulmonary disease, unspecified: Secondary | ICD-10-CM | POA: Diagnosis present

## 2016-10-13 DIAGNOSIS — I130 Hypertensive heart and chronic kidney disease with heart failure and stage 1 through stage 4 chronic kidney disease, or unspecified chronic kidney disease: Principal | ICD-10-CM | POA: Diagnosis present

## 2016-10-13 DIAGNOSIS — J9621 Acute and chronic respiratory failure with hypoxia: Secondary | ICD-10-CM | POA: Diagnosis present

## 2016-10-13 DIAGNOSIS — I10 Essential (primary) hypertension: Secondary | ICD-10-CM | POA: Diagnosis present

## 2016-10-13 DIAGNOSIS — I5042 Chronic combined systolic (congestive) and diastolic (congestive) heart failure: Secondary | ICD-10-CM

## 2016-10-13 DIAGNOSIS — G9341 Metabolic encephalopathy: Secondary | ICD-10-CM | POA: Diagnosis present

## 2016-10-13 DIAGNOSIS — I480 Paroxysmal atrial fibrillation: Secondary | ICD-10-CM | POA: Diagnosis present

## 2016-10-13 DIAGNOSIS — I255 Ischemic cardiomyopathy: Secondary | ICD-10-CM | POA: Diagnosis present

## 2016-10-13 DIAGNOSIS — E785 Hyperlipidemia, unspecified: Secondary | ICD-10-CM | POA: Diagnosis present

## 2016-10-13 DIAGNOSIS — Z66 Do not resuscitate: Secondary | ICD-10-CM | POA: Diagnosis present

## 2016-10-13 DIAGNOSIS — J90 Pleural effusion, not elsewhere classified: Secondary | ICD-10-CM | POA: Diagnosis present

## 2016-10-13 DIAGNOSIS — E871 Hypo-osmolality and hyponatremia: Secondary | ICD-10-CM | POA: Diagnosis present

## 2016-10-13 DIAGNOSIS — E039 Hypothyroidism, unspecified: Secondary | ICD-10-CM | POA: Diagnosis present

## 2016-10-13 DIAGNOSIS — Z9114 Patient's other noncompliance with medication regimen: Secondary | ICD-10-CM

## 2016-10-13 LAB — CBG MONITORING, ED: GLUCOSE-CAPILLARY: 87 mg/dL (ref 65–99)

## 2016-10-13 LAB — RAPID URINE DRUG SCREEN, HOSP PERFORMED
Amphetamines: NOT DETECTED
Barbiturates: NOT DETECTED
Benzodiazepines: NOT DETECTED
Cocaine: NOT DETECTED
Opiates: NOT DETECTED
Tetrahydrocannabinol: NOT DETECTED

## 2016-10-13 LAB — CREATININE, SERUM
CREATININE: 1.32 mg/dL — AB (ref 0.44–1.00)
GFR, EST AFRICAN AMERICAN: 50 mL/min — AB (ref >60)
GFR, EST NON AFRICAN AMERICAN: 43 mL/min — AB (ref >60)

## 2016-10-13 LAB — URINALYSIS, ROUTINE W REFLEX MICROSCOPIC
BILIRUBIN URINE: NEGATIVE
BILIRUBIN URINE: NEGATIVE
Glucose, UA: NEGATIVE mg/dL
Glucose, UA: NEGATIVE mg/dL
HGB URINE DIPSTICK: NEGATIVE
HGB URINE DIPSTICK: NEGATIVE
KETONES UR: NEGATIVE mg/dL
Ketones, ur: NEGATIVE mg/dL
NITRITE: NEGATIVE
Nitrite: NEGATIVE
PH: 7 (ref 5.0–8.0)
PROTEIN: 30 mg/dL — AB
Protein, ur: NEGATIVE mg/dL
SPECIFIC GRAVITY, URINE: 1.02 (ref 1.005–1.030)
SPECIFIC GRAVITY, URINE: 1.01 (ref 1.005–1.030)
pH: 5 (ref 5.0–8.0)

## 2016-10-13 LAB — BLOOD GAS, ARTERIAL
Acid-Base Excess: 6.9 mmol/L — ABNORMAL HIGH (ref 0.0–2.0)
Bicarbonate: 31.5 mmol/L — ABNORMAL HIGH (ref 20.0–28.0)
DRAWN BY: 235321
O2 Content: 2 L/min
O2 Saturation: 95.8 %
PATIENT TEMPERATURE: 98.6
PH ART: 7.446 (ref 7.350–7.450)
pCO2 arterial: 46.4 mmHg (ref 32.0–48.0)
pO2, Arterial: 80.9 mmHg — ABNORMAL LOW (ref 83.0–108.0)

## 2016-10-13 LAB — COMPREHENSIVE METABOLIC PANEL
ALK PHOS: 149 U/L — AB (ref 38–126)
ALT: 24 U/L (ref 14–54)
AST: 31 U/L (ref 15–41)
Albumin: 3.6 g/dL (ref 3.5–5.0)
Anion gap: 11 (ref 5–15)
BILIRUBIN TOTAL: 1.6 mg/dL — AB (ref 0.3–1.2)
BUN: 29 mg/dL — ABNORMAL HIGH (ref 6–20)
CALCIUM: 9.3 mg/dL (ref 8.9–10.3)
CO2: 33 mmol/L — AB (ref 22–32)
CREATININE: 1.36 mg/dL — AB (ref 0.44–1.00)
Chloride: 85 mmol/L — ABNORMAL LOW (ref 101–111)
GFR calc non Af Amer: 41 mL/min — ABNORMAL LOW (ref >60)
GFR, EST AFRICAN AMERICAN: 48 mL/min — AB (ref >60)
GLUCOSE: 96 mg/dL (ref 65–99)
Potassium: 3.8 mmol/L (ref 3.5–5.1)
SODIUM: 129 mmol/L — AB (ref 135–145)
TOTAL PROTEIN: 6.3 g/dL — AB (ref 6.5–8.1)

## 2016-10-13 LAB — CBC
HCT: 35.1 % — ABNORMAL LOW (ref 36.0–46.0)
HEMATOCRIT: 32.7 % — AB (ref 36.0–46.0)
HEMOGLOBIN: 10.7 g/dL — AB (ref 12.0–15.0)
Hemoglobin: 11.3 g/dL — ABNORMAL LOW (ref 12.0–15.0)
MCH: 30.8 pg (ref 26.0–34.0)
MCH: 31.5 pg (ref 26.0–34.0)
MCHC: 32.2 g/dL (ref 30.0–36.0)
MCHC: 32.7 g/dL (ref 30.0–36.0)
MCV: 95.6 fL (ref 78.0–100.0)
MCV: 96.2 fL (ref 78.0–100.0)
PLATELETS: 211 10*3/uL (ref 150–400)
Platelets: 200 10*3/uL (ref 150–400)
RBC: 3.67 MIL/uL — ABNORMAL LOW (ref 3.87–5.11)
RBC: 3.4 MIL/uL — AB (ref 3.87–5.11)
RDW: 18.6 % — ABNORMAL HIGH (ref 11.5–15.5)
RDW: 18.8 % — ABNORMAL HIGH (ref 11.5–15.5)
WBC: 7.7 10*3/uL (ref 4.0–10.5)
WBC: 5.6 10*3/uL (ref 4.0–10.5)

## 2016-10-13 LAB — TSH: TSH: 19.717 u[IU]/mL — AB (ref 0.350–4.500)

## 2016-10-13 LAB — BRAIN NATRIURETIC PEPTIDE

## 2016-10-13 LAB — PHOSPHORUS: Phosphorus: 3.5 mg/dL (ref 2.5–4.6)

## 2016-10-13 LAB — MAGNESIUM: Magnesium: 1.9 mg/dL (ref 1.7–2.4)

## 2016-10-13 MED ORDER — PROCHLORPERAZINE MALEATE 10 MG PO TABS: 10.0000 mg | ORAL_TABLET | Freq: Four times a day (QID) | ORAL | Status: DC | PRN

## 2016-10-13 MED ORDER — FUROSEMIDE 10 MG/ML IJ SOLN
20.0000 mg | Freq: Once | INTRAMUSCULAR | Status: AC
  Administered 2016-10-13: 20 mg via INTRAVENOUS
  Filled 2016-10-13: qty 4

## 2016-10-13 MED ORDER — NITROGLYCERIN 0.4 MG SL SUBL: 0.4000 mg | SUBLINGUAL_TABLET | SUBLINGUAL | Status: DC | PRN

## 2016-10-13 MED ORDER — APIXABAN 5 MG PO TABS
5.0000 mg | ORAL_TABLET | Freq: Two times a day (BID) | ORAL | Status: DC
  Administered 2016-10-13 – 2016-10-19 (×12): 5 mg via ORAL
  Filled 2016-10-13 (×12): qty 1

## 2016-10-13 MED ORDER — ONDANSETRON HCL 4 MG PO TABS: 4.0000 mg | ORAL_TABLET | Freq: Four times a day (QID) | ORAL | Status: DC | PRN

## 2016-10-13 MED ORDER — ONDANSETRON HCL 4 MG/2ML IJ SOLN: 4.0000 mg | Freq: Four times a day (QID) | INTRAMUSCULAR | Status: DC | PRN

## 2016-10-13 MED ORDER — ENSURE ENLIVE PO LIQD
237.0000 mL | Freq: Two times a day (BID) | ORAL | Status: DC
  Administered 2016-10-14 – 2016-10-19 (×10): 237 mL via ORAL

## 2016-10-13 MED ORDER — ATORVASTATIN CALCIUM 40 MG PO TABS
40.0000 mg | ORAL_TABLET | Freq: Every day | ORAL | Status: DC
  Administered 2016-10-14 – 2016-10-18 (×5): 40 mg via ORAL
  Filled 2016-10-13 (×6): qty 1

## 2016-10-13 MED ORDER — ASPIRIN EC 81 MG PO TBEC
81.0000 mg | DELAYED_RELEASE_TABLET | Freq: Every day | ORAL | Status: DC
  Administered 2016-10-14 – 2016-10-18 (×5): 81 mg via ORAL
  Filled 2016-10-13 (×5): qty 1

## 2016-10-13 MED ORDER — ONDANSETRON 4 MG PO TBDP: 4.0000 mg | ORAL_TABLET | Freq: Three times a day (TID) | ORAL | Status: DC | PRN

## 2016-10-13 MED ORDER — IPRATROPIUM-ALBUTEROL 0.5-2.5 (3) MG/3ML IN SOLN
3.0000 mL | Freq: Four times a day (QID) | RESPIRATORY_TRACT | Status: DC
  Administered 2016-10-13: 3 mL via RESPIRATORY_TRACT

## 2016-10-13 MED ORDER — LEVOTHYROXINE SODIUM 25 MCG PO TABS
25.0000 ug | ORAL_TABLET | Freq: Every day | ORAL | Status: DC
  Administered 2016-10-14 – 2016-10-19 (×6): 25 ug via ORAL
  Filled 2016-10-13 (×6): qty 1

## 2016-10-13 MED ORDER — IPRATROPIUM-ALBUTEROL 0.5-2.5 (3) MG/3ML IN SOLN
3.0000 mL | Freq: Three times a day (TID) | RESPIRATORY_TRACT | Status: DC
  Administered 2016-10-14 – 2016-10-19 (×16): 3 mL via RESPIRATORY_TRACT
  Filled 2016-10-13 (×16): qty 3

## 2016-10-13 MED ORDER — SODIUM CHLORIDE 0.9% FLUSH
3.0000 mL | Freq: Two times a day (BID) | INTRAVENOUS | Status: DC
  Administered 2016-10-14 – 2016-10-19 (×9): 3 mL via INTRAVENOUS

## 2016-10-13 MED ORDER — METOPROLOL SUCCINATE ER 25 MG PO TB24
25.0000 mg | ORAL_TABLET | Freq: Every day | ORAL | Status: DC
  Administered 2016-10-14: 25 mg via ORAL
  Filled 2016-10-13: qty 1

## 2016-10-13 MED ORDER — ACETAMINOPHEN 325 MG PO TABS
650.0000 mg | ORAL_TABLET | Freq: Four times a day (QID) | ORAL | Status: DC | PRN
  Administered 2016-10-14 – 2016-10-18 (×2): 650 mg via ORAL
  Filled 2016-10-13 (×2): qty 2

## 2016-10-13 MED ORDER — SODIUM CHLORIDE 0.9 % IV BOLUS (SEPSIS)
1000.0000 mL | Freq: Once | INTRAVENOUS | Status: AC
  Administered 2016-10-13: 1000 mL via INTRAVENOUS

## 2016-10-13 MED ORDER — SODIUM CHLORIDE 0.45 % IV SOLN
INTRAVENOUS | Status: AC
  Administered 2016-10-13: 21:00:00 via INTRAVENOUS

## 2016-10-13 MED ORDER — IOPAMIDOL (ISOVUE-370) INJECTION 76%
100.0000 mL | Freq: Once | INTRAVENOUS | Status: AC | PRN
  Administered 2016-10-13: 80 mL via INTRAVENOUS

## 2016-10-13 MED ORDER — SODIUM CHLORIDE 0.9 % IJ SOLN
INTRAMUSCULAR | Status: AC
  Filled 2016-10-13: qty 50

## 2016-10-13 MED ORDER — NICOTINE 14 MG/24HR TD PT24
14.0000 mg | MEDICATED_PATCH | Freq: Every day | TRANSDERMAL | Status: DC
  Administered 2016-10-13 – 2016-10-19 (×7): 14 mg via TRANSDERMAL
  Filled 2016-10-13 (×7): qty 1

## 2016-10-13 MED ORDER — IOPAMIDOL (ISOVUE-370) INJECTION 76%
INTRAVENOUS | Status: AC
  Filled 2016-10-13: qty 100

## 2016-10-13 MED ORDER — ACETAMINOPHEN 650 MG RE SUPP: 650.0000 mg | Freq: Four times a day (QID) | RECTAL | Status: DC | PRN

## 2016-10-13 NOTE — Progress Notes (Signed)
ED Cm spoke with Quillian Quince at [facility] to confirm pt is a DME pt for oxygen with [facility]  Quillian Quince confirms pt can be assist for oxygen tank for d/c home if needed on 10/14/16  Request order for oxygen in EPIC to begin the processing of oxygen for 10/14/16

## 2016-10-13 NOTE — ED Triage Notes (Addendum)
Per [name] pt brought from nuclear medicine for possible overdose related to confusion onset of unknown time. Pt able to verbalize self, place, situation, but time off by more than two calender days.

## 2016-10-13 NOTE — Progress Notes (Signed)
ED CM made contact with covering ED SW  ED SW pointed out pt with oxygen use and may not qualify for cab Pt did confirm with ED Cm during assessment that she uses an oxygen concentrator at home Pt on Oxygen in [facility] ED  No tanks brought from home ED CM discussed this with [place] Scrape and ED RN

## 2016-10-13 NOTE — Progress Notes (Signed)
Clarification A [employer] [phone] is transportation

## 2016-10-13 NOTE — ED Provider Notes (Signed)
[facility] DEPT Provider Note   CSN: [id number] Arrival date & time: 10/13/16  0910     History   Chief Complaint Chief Complaint  Patient presents with  . Altered Mental Status    HPI Gina Frederick is a 60 y.o. female.  HPI Pt comes in with cc of confusion. Pt has hx of throat CA, ICM (EF 45%), PAD on anticoagulation. Pt was at [facility] for a nuclear medicine scan. Pt didn't get the scan, and she wasn't NPO - but then the staff over there found patient to be confused. Pt was sent to the ER for further evaluation. I called [phone] to speak with Megan Salon. Mr. Trinna Balloon informed that Ms. 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 Pt has new cancer and needs care of that. They have also been noticing that pt is acting like a zombie on several days - and they feel like she is taking her month worth of ativan in 1 week. Pt also might be smoking in the house and makes her own cigarettes with unknown substances. They are overwhelmed. They think pt needs more help than they can provide.   ROS 10 Systems reviewed and are negative for acute change except as noted in the HPI.      Past Medical History:  Diagnosis Date  . Arthritis   . Centrilobular emphysema ([place])   . Edentulous    Multiple dental extractions March 2017  . Essential hypertension   . Hyperlipidemia   . Ischemic cardiomyopathy    LVEF 40-45%  . NSTEMI (non-ST elevated myocardial infarction) [facility])    April 2017 - managed medically due to comorbid illnesses  . PAF (paroxysmal atrial fibrillation) ([place])   . 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 Whitney Muse    Patient Active Problem List   Diagnosis Date Noted  . Insomnia 05/11/2016  . Anemia in chronic kidney disease (CKD) 03/13/2016  . Acute heart failure ([place])   . ARF (acute renal failure) ([place])   . Chronic systolic CHF (congestive heart failure)  ([place])   . Pulmonary hypertension   . Atrial fibrillation with RVR ([place])   . Essential hypertension   . Acute respiratory failure with hypoxia ([place])   . Chronic obstructive pulmonary disease ([place])   . Acute renal failure ([place])   . Squamous cell carcinoma of neck   . NSTEMI (non-ST elevated myocardial infarction) ([place]) 03/03/2016  . Cancer related pain 02/07/2016  . Insomnia due to medical condition 02/07/2016  . Absolute anemia 01/22/2016  . Port-a-cath in place 01/22/2016  . Pre-chemoradiation therapy dental protocol examination 01/07/2016  . Squamous cell carcinoma of head and neck ([place]) 12/28/2015  . Tobacco use 12/28/2015  . HTN (hypertension) 12/28/2015    Past Surgical History:  Procedure Laterality Date  . CESAREAN SECTION    . IR GENERIC HISTORICAL  08/07/2016   IR PATIENT EVAL TECH 0-60 MINS [facility]-INTERV RAD  . KNEE ARTHROSCOPY Right   . MASS BIOPSY Right 12/21/2015   Procedure: NECK MASS BIOPSY;  Surgeon: Leta Baptist, MD;  Location: [facility];  Service: ENT;  Laterality: Right;  . MULTIPLE EXTRACTIONS WITH ALVEOLOPLASTY N/A 01/13/2016   Procedure: EXTRACTION OF TOOTH #'S 6-12,15, 19-28, 30 WITH ALVEOLOPLASTY AND BILATERAL MAXILLARY TUBEROSITY REDUCTIONS;  Surgeon: Lenn Cal, DDS;  Location: [facility];  Service: Oral Surgery;  Laterality: N/A;  .  PANENDOSCOPY N/A 12/21/2015   Procedure: PANENDOSCOPY;  Surgeon: Leta Baptist, MD;  Location: [facility];  Service: ENT;  Laterality: N/A;  . TONSILLECTOMY      OB History    No data available       Home Medications    Prior to Admission medications   Medication Sig Start Date End Date Taking? Authorizing Provider  acetaminophen (TYLENOL) 500 MG tablet Take 1,000 mg by mouth every 6 (six) hours as needed for mild pain or moderate pain.    Yes Historical Provider, MD  apixaban (ELIQUIS) 5 MG TABS tablet Take 1 tablet (5 mg total) by mouth 2 (two) times daily. 09/04/16  Yes Satira Sark, MD    atorvastatin (LIPITOR) 40 MG tablet Take 1 tablet (40 mg total) by mouth daily at 6 PM. 03/10/16  Yes Eugenie Filler, MD  diphenhydrAMINE (BENADRYL) 25 MG tablet Take 25 mg by mouth every 6 (six) hours as needed. Reported on 03/13/2016   Yes Historical Provider, MD  levothyroxine (SYNTHROID, LEVOTHROID) 25 MCG tablet TAKE ONE TABLET EACH MORNING BEFORE BREAKFAST 09/11/16  Yes Patrici Ranks, MD  lidocaine-prilocaine (EMLA) cream Apply a quarter size amount to port site 1 hour prior to chemo. Do not rub in. Cover with plastic wrap. 01/20/16  Yes Patrici Ranks, MD  LORazepam (ATIVAN) 0.5 MG tablet Take 1 tablet (0.5 mg total) by mouth every 4 (four) hours as needed for anxiety (one tab every 4-6 hours as needed for nausea and vomiting). 07/31/16  Yes Patrici Ranks, MD  metoprolol succinate (TOPROL XL) 25 MG 24 hr tablet Take 1 tablet (25 mg total) by mouth daily. 09/04/16  Yes Satira Sark, MD  nitroGLYCERIN (NITROSTAT) 0.4 MG SL tablet Place 1 tablet (0.4 mg total) under the tongue every 5 (five) minutes x 3 doses as needed for chest pain. 03/10/16  Yes Eugenie Filler, MD  ondansetron (ZOFRAN-ODT) 4 MG disintegrating tablet TAKE ONE TABLET BY MOUTH EVERY 8 HOURS. AS NEEDED FOR NAUSEA AND VOMITING 10/09/16  Yes Patrici Ranks, MD  POT BICARB-POT CHLORIDE,25MEQ, 25 MEQ TBEF Take 1.6 tablets (40 mEq total) by mouth daily. 05/05/16  Yes Baird Cancer, PA-C  prochlorperazine (COMPAZINE) 10 MG tablet Take 1 tablet (10 mg total) by mouth every 6 (six) hours as needed for nausea or vomiting. 09/05/16  Yes Manon Hilding Kefalas, PA-C  zolpidem (AMBIEN) 10 MG tablet Take 1 tablet (10 mg total) by mouth at bedtime as needed for sleep. 08/24/16 10/13/16 Yes Patrici Ranks, MD  benzonatate (TESSALON) 100 MG capsule Take 1 capsule (100 mg total) by mouth 3 (three) times daily as needed for cough. Patient not taking: Reported on 10/13/2016 03/10/16   Eugenie Filler, MD  feeding supplement, ENSURE  ENLIVE, (ENSURE ENLIVE) LIQD Take 237 mLs by mouth 2 (two) times daily between meals. 03/10/16   Eugenie Filler, MD  prochlorperazine (COMPAZINE) 5 MG tablet Take 1 tablet (5 mg total) by mouth every 6 (six) hours as needed for nausea or vomiting. Patient not taking: Reported on 10/13/2016 09/20/16   Baird Cancer, PA-C    Family History Family History  Problem Relation Age of Onset  . Alzheimer's disease Mother   . Congestive Heart Failure Father 37    Social History Social History  Substance Use Topics  . Smoking status: Current Every Day Smoker    Packs/day: 0.10    Years: 20.00    Types: Cigarettes  . Smokeless  tobacco: Never Used     Comment: She took a puff two days ago  . Alcohol use No     Allergies   Codeine   Review of Systems Review of Systems   Physical Exam Updated Vital Signs BP 135/91 (BP Location: Right Arm)   Pulse 89   Temp 98.4 F (36.9 C) (Oral)   Resp 20   Ht 5\' 6"  (1.676 m)   Wt 130 lb (59 kg)   SpO2 100%   BMI 20.98 kg/m   Physical Exam  Constitutional: She appears well-developed.  HENT:  Head: Normocephalic and atraumatic.  Eyes: EOM are normal.  Neck: Normal range of motion. Neck supple.  Cardiovascular: Normal rate.   Pulmonary/Chest: Effort normal.  Abdominal: Bowel sounds are normal.  Neurological:  Confused about how she got here and doesn't know what year it is. Oriented to self and location  Skin: Skin is warm and dry.  Nursing note and vitals reviewed.    ED Treatments / Results  Labs (all labs ordered are listed, but only abnormal results are displayed) Labs Reviewed  COMPREHENSIVE METABOLIC PANEL - Abnormal; Notable for the following:       Result Value   Sodium 129 (*)    Chloride 85 (*)    CO2 33 (*)    BUN 29 (*)    Creatinine, Ser 1.36 (*)    Total Protein 6.3 (*)    Alkaline Phosphatase 149 (*)    Total Bilirubin 1.6 (*)    GFR calc non Af Amer 41 (*)    GFR calc Af Amer 48 (*)    All other  components within normal limits  CBC - Abnormal; Notable for the following:    RBC 3.67 (*)    Hemoglobin 11.3 (*)    HCT 35.1 (*)    RDW 18.6 (*)    All other components within normal limits  URINALYSIS, ROUTINE W REFLEX MICROSCOPIC - Abnormal; Notable for the following:    APPearance HAZY (*)    Protein, ur 30 (*)    Leukocytes, UA SMALL (*)    Bacteria, UA RARE (*)    Squamous Epithelial / LPF 0-5 (*)    All other components within normal limits  RAPID URINE DRUG SCREEN, HOSP PERFORMED  CBG MONITORING, ED    EKG  EKG Interpretation None       Radiology Ct Head Wo Contrast  Result Date: 10/13/2016 CLINICAL DATA:  Per [name] pt brought from nuclear medicine for possible overdose related to confusion onset of unknown time. Pt able to verbalize self, place, situation, but time off by more than two calender days. EXAM: CT HEAD WITHOUT CONTRAST TECHNIQUE: Contiguous axial images were obtained from the base of the skull through the vertex without intravenous contrast. COMPARISON:  06/26/2016 PET-CT, 12/17/2015 neck CT FINDINGS: Brain: There is significant central and cortical atrophy. Mild periventricular white matter changes are consistent with small vessel disease. There is no intra or extra-axial fluid collection or mass lesion. The basilar cisterns and ventricles have a normal appearance. There is no CT evidence for acute infarction or hemorrhage. Vascular: There is mild atherosclerotic calcification of the carotid siphons. Skull: Normal. Negative for fracture or focal lesion. Sinuses/Orbits: There is mucoperiosteal thickening of the maxillary sinuses bilaterally. Bilateral mastoid effusions. Other: None IMPRESSION: 1. Atrophy and small vessel disease. 2.  No evidence for acute intracranial abnormality. 3. New bilateral mastoid effusions. 4. Paranasal sinus disease. Electronically Signed   By: Nolon Nations  M.D.   On: 10/13/2016 11:37    Procedures Procedures (including critical  care time)  Medications Ordered in ED Medications  sodium chloride 0.9 % bolus 1,000 mL (0 mLs Intravenous Stopped 10/13/16 1729)     Initial Impression / Assessment and Plan / ED Course  I have reviewed the triage vital signs and the nursing notes.  Pertinent labs & imaging results that were available during my care of the patient were reviewed by me and considered in my medical decision making (see chart for details).  Clinical Course     Pt has poor insight into her health and doesn't know how she got here. Has cancer hx - CT scan is neg of the head. Labs show mild dehydration. SW and Case management called - they have cleared the patient. I dont feel comfortable discharging the patient. She lives in [place] - we cant get her cab to that town.   6:09 PM When patient was taken off of Oxygen - she became hypoxic - O2 sats 83% on room air. Pt reports that she uses PRN O2. I spoke with Megan Salon again - and he informed me that pt was sent in a cab w/o O2. It seems like pt was having global amnesia when she arrived -which would explain how pt was confused to the point she had no idea how she got to the hospital and what test she was in the ER for - but appears here to be pretty knowledgeable.   - Admit for hypoxia. Needs constant O2. This it is COPD worsening. - Appreciate hospitalist team admitting. Dr. Chana Bode requested CT PE, which I have ordered. - Oncology reports that PET scan is an outpatient study only. Pt cant get PET scan during this stay. - Case management and SW will ensure pt gets new O2 and also help with transportation.  Final Clinical Impressions(s) / ED Diagnoses   Final diagnoses:  Chronic obstructive pulmonary disease, unspecified COPD type ([place])    New Prescriptions New Prescriptions   No medications on file     Varney Biles, MD 10/13/16 1818

## 2016-10-13 NOTE — ED Notes (Signed)
Pt was found to be 83% on RA after removing [place]. Pt sts that she is on O2 at home as needed. Dr Kathrynn Humble notified

## 2016-10-13 NOTE — Progress Notes (Signed)
   10/13/16 0000  CM Assessment  Expected Discharge Plan Home/Self Care  In-house Referral Clinical Social Work  Discharge Planning Services CM Consult  [employer] Choice NA  Choice offered to / list presented to  Patient  DME Arranged N/A  DME Agency NA  [place] Arranged NA  [place] Agency NA  Status of Service Completed, signed off  Discharge Disposition Home/Self Care   ED CM consulted to assist with pt CM spoke with Dr Kathrynn Humble and ED SW ED SW and CM spoke with pt  Pt confirms she is from 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   Pt noted to be continuously in motion and looking throughout her purse while cm and sw present Pt able to tell Cm it is "December eighth." "saturday" Oriented to person, place but not time or total situation Pt took a bottle of soda out of her purse and drank a sip from it She reports being very hunger and wanting a hamburger.  Cm reviewed that ED pts generally do not eat nor drink until all ED labs and imaging completed to prevent causing errors in tests Pt voiced understanding but continued to state during assessment that she was hungry Pt noted to have a cigarette purse on floor Cm placed cigarette purse and drink out of reach until ED RN could be consulted if pt could drink Pt confirmed her address as [place] stating she is not sure how she woke up her Pt aware she was brought here in a car and asking how she is going to get home Pt needing redirection to stay on topic and to answer questions being asked  Pt point to the right side of her neck to show her scar and swelling Reports last seeing a medical provider in 2016 and states she is suppose to have a "biopsy" of right side of neck after PMH of cancer (not able to tell CM what type of cancer) Reports is suppose to follow up with oncology she sees  "on the first." Then "today when I finished doing all I needed to do"  Reports recent spells in "the last month or so" where "I begin to do something and end up doing something else", "do not know how I got here" Pt confirms she does not drive She attempted to tell Cm names of her medication but noted to be holding her forehead with her right hand  Pt with O2 on in rm 22 Pt confirms she has an oxygen concentrator at home but can not tell Cm how many liters is used at home. Cm discussed with pt that the ED RN would be consulted to offer her a ham sandwich vs a hamburger Pt agreed to ham sandwich

## 2016-10-13 NOTE — ED Notes (Signed)
ED Provider at bedside. 

## 2016-10-13 NOTE — Progress Notes (Addendum)
ED CM and SW reviewed assessments with EDP, Nanavati who request an in basket message be sent to oncologist Voice concern with pt taking increased amounts of ativan, etc CM sent this message high priority to Dr Whitney Muse Dr Whitney Muse Dr Anit Kathrynn Humble saw Gina Frederick on 10/13/16 at [facility] long ED and wants you to be made aware of her ED visit and he voices concern with pt increase use of ativan (per housemates) and increase swelling on right side of face, throat (questions if related to oncology hx) Pt came in with confusion and noted anxiety.  Dr Kathrynn Humble wanted to see if she could see you as a follow up    Pt is not a home health candidate (not home bound, no skilled need) Pt has a pcp, insurance and able to get medications via transportation from [place] confirms she does not drive but Gina Frederick drives her

## 2016-10-13 NOTE — Progress Notes (Signed)
Orders for home oxygen, face sheet, ED RN note (o2 drop to 83%) AND EDP note faxed to [facility] dept with Fax confirmation received at [phone] 10/13/16

## 2016-10-13 NOTE — ED Notes (Signed)
Radiology advises that "[facility]" brought this pt form [place]. [phone]

## 2016-10-13 NOTE — Progress Notes (Signed)
ED Cm approached by ED nursing director, stacy for a cab voucher for pt  Cm notified ED SW via phone

## 2016-10-13 NOTE — ED Notes (Signed)
Pt being held in ED until she has all radiological studies complete. 4th floor notified that pt will be transported after shift change

## 2016-10-13 NOTE — ED Notes (Signed)
Patient transported to CT 

## 2016-10-13 NOTE — ED Notes (Signed)
Pt sts that she needs to go outside and smoke a cigarette. Pt was advised that she is not allowed to leave the building and smoking is not allowed

## 2016-10-13 NOTE — Progress Notes (Signed)
ED Cm called and spoke with [facility] [place], [place], [place] [address] 8AM-6:30PM who confirms this was the first transport of this pt to her nuclear appt at [facility] prior to her being brought to [facility] ED from her Nuclear appt.   Presently all the vans for this company are parked related to the weather alert- Snow/ice but may be open again on 10/14/16  Roland Rack recommends that the office be called around 10 am on 10/14/16 so it will give the office time to process transportation referral with "[place]"

## 2016-10-13 NOTE — Progress Notes (Signed)
ED CM and CSW spoke with patient at bedside with no family present. Patient was fidgety and anxious during this assessment, however, ED CM made attempts to comfort patient as much as possible. 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 Patient reports she lives with Olegario Shearer and her son and has been for three years. Patient reports she will be safe returning home. Patient reports she has been having spells recently. Patient reports she will "go to do something and then don't do it" and that this started "couple weeks ago". Patient reports she has cancer but could not remember the exact name. Patient reports she has Medicaid. Staffed this information with EDP. No questions noted for CSW at this time.   Merry Proud, LCSWA Clinical Social Worker  [phone] 3:10 PM
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
## 2016-10-14 ENCOUNTER — Observation Stay (HOSPITAL_BASED_OUTPATIENT_CLINIC_OR_DEPARTMENT_OTHER): Payer: Medicaid Other

## 2016-10-14 ENCOUNTER — Observation Stay (HOSPITAL_COMMUNITY): Payer: Medicaid Other

## 2016-10-14 DIAGNOSIS — L89152 Pressure ulcer of sacral region, stage 2: Secondary | ICD-10-CM | POA: Diagnosis present

## 2016-10-14 DIAGNOSIS — N182 Chronic kidney disease, stage 2 (mild): Secondary | ICD-10-CM | POA: Diagnosis not present

## 2016-10-14 DIAGNOSIS — R410 Disorientation, unspecified: Secondary | ICD-10-CM | POA: Diagnosis present

## 2016-10-14 DIAGNOSIS — I4891 Unspecified atrial fibrillation: Secondary | ICD-10-CM | POA: Diagnosis not present

## 2016-10-14 DIAGNOSIS — Z9221 Personal history of antineoplastic chemotherapy: Secondary | ICD-10-CM | POA: Diagnosis not present

## 2016-10-14 DIAGNOSIS — E871 Hypo-osmolality and hyponatremia: Secondary | ICD-10-CM | POA: Diagnosis present

## 2016-10-14 DIAGNOSIS — D631 Anemia in chronic kidney disease: Secondary | ICD-10-CM | POA: Diagnosis present

## 2016-10-14 DIAGNOSIS — Z79899 Other long term (current) drug therapy: Secondary | ICD-10-CM | POA: Diagnosis not present

## 2016-10-14 DIAGNOSIS — G9341 Metabolic encephalopathy: Secondary | ICD-10-CM | POA: Diagnosis present

## 2016-10-14 DIAGNOSIS — J439 Emphysema, unspecified: Secondary | ICD-10-CM | POA: Diagnosis not present

## 2016-10-14 DIAGNOSIS — I5042 Chronic combined systolic (congestive) and diastolic (congestive) heart failure: Secondary | ICD-10-CM | POA: Diagnosis not present

## 2016-10-14 DIAGNOSIS — N179 Acute kidney failure, unspecified: Secondary | ICD-10-CM | POA: Diagnosis present

## 2016-10-14 DIAGNOSIS — F17210 Nicotine dependence, cigarettes, uncomplicated: Secondary | ICD-10-CM | POA: Diagnosis present

## 2016-10-14 DIAGNOSIS — E785 Hyperlipidemia, unspecified: Secondary | ICD-10-CM | POA: Diagnosis present

## 2016-10-14 DIAGNOSIS — Z923 Personal history of irradiation: Secondary | ICD-10-CM | POA: Diagnosis not present

## 2016-10-14 DIAGNOSIS — I252 Old myocardial infarction: Secondary | ICD-10-CM | POA: Diagnosis not present

## 2016-10-14 DIAGNOSIS — Z9981 Dependence on supplemental oxygen: Secondary | ICD-10-CM | POA: Diagnosis not present

## 2016-10-14 DIAGNOSIS — E039 Hypothyroidism, unspecified: Secondary | ICD-10-CM | POA: Diagnosis present

## 2016-10-14 DIAGNOSIS — J90 Pleural effusion, not elsewhere classified: Secondary | ICD-10-CM | POA: Diagnosis present

## 2016-10-14 DIAGNOSIS — I255 Ischemic cardiomyopathy: Secondary | ICD-10-CM | POA: Diagnosis present

## 2016-10-14 DIAGNOSIS — I480 Paroxysmal atrial fibrillation: Secondary | ICD-10-CM | POA: Diagnosis present

## 2016-10-14 DIAGNOSIS — I10 Essential (primary) hypertension: Secondary | ICD-10-CM | POA: Diagnosis not present

## 2016-10-14 DIAGNOSIS — N189 Chronic kidney disease, unspecified: Secondary | ICD-10-CM | POA: Diagnosis present

## 2016-10-14 DIAGNOSIS — J9601 Acute respiratory failure with hypoxia: Secondary | ICD-10-CM | POA: Diagnosis not present

## 2016-10-14 DIAGNOSIS — I130 Hypertensive heart and chronic kidney disease with heart failure and stage 1 through stage 4 chronic kidney disease, or unspecified chronic kidney disease: Secondary | ICD-10-CM | POA: Diagnosis present

## 2016-10-14 DIAGNOSIS — J449 Chronic obstructive pulmonary disease, unspecified: Secondary | ICD-10-CM | POA: Diagnosis present

## 2016-10-14 DIAGNOSIS — Z7901 Long term (current) use of anticoagulants: Secondary | ICD-10-CM | POA: Diagnosis not present

## 2016-10-14 DIAGNOSIS — I5043 Acute on chronic combined systolic (congestive) and diastolic (congestive) heart failure: Secondary | ICD-10-CM | POA: Diagnosis present

## 2016-10-14 DIAGNOSIS — Z66 Do not resuscitate: Secondary | ICD-10-CM | POA: Diagnosis present

## 2016-10-14 DIAGNOSIS — C760 Malignant neoplasm of head, face and neck: Secondary | ICD-10-CM | POA: Diagnosis present

## 2016-10-14 DIAGNOSIS — J9621 Acute and chronic respiratory failure with hypoxia: Secondary | ICD-10-CM | POA: Diagnosis present

## 2016-10-14 LAB — COMPREHENSIVE METABOLIC PANEL
ALT: 23 U/L (ref 14–54)
AST: 29 U/L (ref 15–41)
Albumin: 3.3 g/dL — ABNORMAL LOW (ref 3.5–5.0)
Alkaline Phosphatase: 125 U/L (ref 38–126)
Anion gap: 9 (ref 5–15)
BILIRUBIN TOTAL: 1.4 mg/dL — AB (ref 0.3–1.2)
BUN: 27 mg/dL — AB (ref 6–20)
CHLORIDE: 87 mmol/L — AB (ref 101–111)
CO2: 33 mmol/L — ABNORMAL HIGH (ref 22–32)
CREATININE: 1.26 mg/dL — AB (ref 0.44–1.00)
Calcium: 8.6 mg/dL — ABNORMAL LOW (ref 8.9–10.3)
GFR calc Af Amer: 53 mL/min — ABNORMAL LOW (ref >60)
GFR, EST NON AFRICAN AMERICAN: 45 mL/min — AB (ref >60)
Glucose, Bld: 108 mg/dL — ABNORMAL HIGH (ref 65–99)
Potassium: 3.5 mmol/L (ref 3.5–5.1)
Sodium: 129 mmol/L — ABNORMAL LOW (ref 135–145)
Total Protein: 5.8 g/dL — ABNORMAL LOW (ref 6.5–8.1)

## 2016-10-14 LAB — CBC
HCT: 32.4 % — ABNORMAL LOW (ref 36.0–46.0)
Hemoglobin: 10.4 g/dL — ABNORMAL LOW (ref 12.0–15.0)
MCH: 31.1 pg (ref 26.0–34.0)
MCHC: 32.1 g/dL (ref 30.0–36.0)
MCV: 97 fL (ref 78.0–100.0)
Platelets: 201 K/uL (ref 150–400)
RBC: 3.34 MIL/uL — ABNORMAL LOW (ref 3.87–5.11)
RDW: 19.1 % — ABNORMAL HIGH (ref 11.5–15.5)
WBC: 6.5 K/uL (ref 4.0–10.5)

## 2016-10-14 LAB — ECHOCARDIOGRAM COMPLETE
Height: 64 in
Weight: 2203.2 [oz_av]

## 2016-10-14 LAB — HEPATIC FUNCTION PANEL
ALT: 24 U/L (ref 14–54)
AST: 29 U/L (ref 15–41)
Albumin: 3.1 g/dL — ABNORMAL LOW (ref 3.5–5.0)
Alkaline Phosphatase: 127 U/L — ABNORMAL HIGH (ref 38–126)
Bilirubin, Direct: 0.6 mg/dL — ABNORMAL HIGH (ref 0.1–0.5)
Indirect Bilirubin: 0.9 mg/dL (ref 0.3–0.9)
Total Bilirubin: 1.5 mg/dL — ABNORMAL HIGH (ref 0.3–1.2)
Total Protein: 5.5 g/dL — ABNORMAL LOW (ref 6.5–8.1)

## 2016-10-14 LAB — GRAM STAIN

## 2016-10-14 LAB — PROTEIN, BODY FLUID: Total protein, fluid: <3 g/dL

## 2016-10-14 LAB — LACTATE DEHYDROGENASE, PLEURAL OR PERITONEAL FLUID: LD, Fluid: 48 U/L — ABNORMAL HIGH (ref 3–23)

## 2016-10-14 LAB — LACTATE DEHYDROGENASE: LDH: 206 U/L — ABNORMAL HIGH (ref 98–192)

## 2016-10-14 LAB — BODY FLUID CELL COUNT WITH DIFFERENTIAL
Lymphs, Fluid: 13 %
MONOCYTE-MACROPHAGE-SEROUS FLUID: 33 % — AB (ref 50–90)
Neutrophil Count, Fluid: 54 % — ABNORMAL HIGH (ref 0–25)
Total Nucleated Cell Count, Fluid: 111 cu mm (ref 0–1000)

## 2016-10-14 MED ORDER — FUROSEMIDE 10 MG/ML IJ SOLN
20.0000 mg | Freq: Every day | INTRAMUSCULAR | Status: DC
  Administered 2016-10-14: 20 mg via INTRAVENOUS
  Filled 2016-10-14: qty 2

## 2016-10-14 MED ORDER — FUROSEMIDE 10 MG/ML IJ SOLN
20.0000 mg | Freq: Two times a day (BID) | INTRAMUSCULAR | Status: DC
  Administered 2016-10-15: 20 mg via INTRAVENOUS
  Filled 2016-10-14: qty 2

## 2016-10-14 MED ORDER — CARVEDILOL 3.125 MG PO TABS
3.1250 mg | ORAL_TABLET | Freq: Two times a day (BID) | ORAL | Status: DC
  Administered 2016-10-14 – 2016-10-18 (×8): 3.125 mg via ORAL
  Filled 2016-10-14 (×8): qty 1

## 2016-10-14 MED ORDER — POTASSIUM CHLORIDE CRYS ER 20 MEQ PO TBCR
40.0000 meq | EXTENDED_RELEASE_TABLET | Freq: Once | ORAL | Status: AC
  Administered 2016-10-14: 40 meq via ORAL
  Filled 2016-10-14: qty 2

## 2016-10-14 MED ORDER — LISINOPRIL 2.5 MG PO TABS
2.5000 mg | ORAL_TABLET | Freq: Every day | ORAL | Status: DC
  Administered 2016-10-14 – 2016-10-15 (×2): 2.5 mg via ORAL
  Filled 2016-10-14 (×2): qty 1

## 2016-10-14 NOTE — Progress Notes (Signed)
[facility] PROGRESS NOTE    Progress Note  Gina Frederick  [id number] DOB: 22-Apr-1956 DOA: 10/13/2016 PCP: Royce Macadamia D., PA-C     Brief Narrative:   Gina Frederick is an 60 y.o. 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 Assessment/Plan:   Acute on chronic respiratory failure with hypoxia: CT scan of the chest showed no PE but additional moderate sized right greater than left pleural effusion or We'll consult IR for thoracocentesis, sent for LDH and protein. Patient requiring O2 he has remained febrile with leukocytosis. History echo back on 06/01/2016 that showed an EF of 45% and grade 2 diastolic heart failure with severe hypokinesis and significant wall motion abnormality. Started on Coreg and lisinopril, awaiting 2-D echo.   Acute encephalopathy: CT scan of the head was done that showed no acute intracranial findings, UDS was negative and did not show any signs of infections. Sedative Medications were held she has remained afebrile with no leukocytosis. Her encephalopathy is likely due to hypoxia.  Chronic combined systolic failure ([place]) Started on Coreg and lisinopril. looking back to her cardiology is now she has not been taking her Lopressor for several months. 2-D echo is pending.  When cardiology saw her back on 03/10/2016 to the recommended medical management for heart failure due to multiple medical comorbidities. Check basic metabolic panel daily.  Essential hypertension Blood pressures is High.   Anemia in chronic kidney disease (CKD) Hemoglobin at baseline follow-up of PCP as an outpatient.  Paroxysmal atrial fibrillation (HCC) Rate controlled  continue apixiban.    Hyponatremia Continue IV Lasix and monitor basic metabolic panel daily. Will supplement with oral potassium.  Hypothyroidism: Continues to recheck TSH and free T4 as an outpatient.   Noncompliance of medication:  DVT prophylaxis: lovenox Family Communication:none Disposition Plan/Barrier to D/C: unable to determine Code Status:     Code Status Orders        Start     Ordered   10/13/16 2042  Full code  Continuous     10/13/16 2041    Code Status History    Date Active Date Inactive Code Status Order ID Comments User Context   03/07/2016  5:27 PM 03/10/2016  7:53 PM DNR [id number]  Allie Bossier, MD Inpatient   03/03/2016 10:23 PM 03/07/2016  5:27 PM Full Code [id number]  Minus Breeding, MD Inpatient        IV Access:    Peripheral IV   Procedures and diagnostic studies:   Dg Chest 2 View  Result Date: 10/13/2016 CLINICAL DATA:  Cough and confusion with UTI. EXAM: CHEST  2 VIEW COMPARISON:  03/03/2016 FINDINGS: Left-sided Port-A-Cath has tip just below the cavoatrial junction. Lungs are adequately inflated demonstrate worsening bibasilar opacification right greater than left likely small effusions with associated atelectasis versus infection. Mild stable cardiomegaly. Remainder of the exam is unchanged. IMPRESSION: Bibasilar opacification compatible with bilateral effusions with associated atelectasis versus infection. Electronically Signed   By: Marin Olp M.D.   On: 10/13/2016 18:45   Ct Head Wo Contrast  Result Date: 10/13/2016 CLINICAL DATA:  Per [name] pt brought from nuclear medicine for possible overdose related to confusion onset of unknown time. Pt able to verbalize self, place, situation, but time  off by more than two calender days. EXAM: CT HEAD WITHOUT CONTRAST TECHNIQUE: Contiguous axial images were obtained from the base of the skull through the vertex without intravenous contrast. COMPARISON:  06/26/2016 PET-CT, 12/17/2015 neck CT  FINDINGS: Brain: There is significant central and cortical atrophy. Mild periventricular white matter changes are consistent with small vessel disease. There is no intra or extra-axial fluid collection or mass lesion. The basilar cisterns and ventricles have a normal appearance. There is no CT evidence for acute infarction or hemorrhage. Vascular: There is mild atherosclerotic calcification of the carotid siphons. Skull: Normal. Negative for fracture or focal lesion. Sinuses/Orbits: There is mucoperiosteal thickening of the maxillary sinuses bilaterally. Bilateral mastoid effusions. Other: None IMPRESSION: 1. Atrophy and small vessel disease. 2.  No evidence for acute intracranial abnormality. 3. New bilateral mastoid effusions. 4. Paranasal sinus disease. Electronically Signed   By: Nolon Nations M.D.   On: 10/13/2016 11:37   Ct Soft Tissue Neck W Contrast  Result Date: 10/13/2016 CLINICAL DATA:  60 y/o F; history of squamous cell carcinoma of the neck status post radiation and chemotherapy. Patient currently with altered mental status. For follow-up. EXAM: CT NECK WITH CONTRAST TECHNIQUE: Multidetector CT imaging of the neck was performed using the standard protocol following the bolus administration of intravenous contrast. CONTRAST:  80 cc Isovue 370. COMPARISON:  12/17/2015 CT of the neck and 06/26/2016 PET-CT. FINDINGS: Ill-defined predominantly hypoattenuating/hypoenhancing mass centered in the right anterolateral neck soft tissues spanning approximately 54 x 34 x 65 mm (AP x ML x CC). There are multiple irregular foci of nodular enhancement throughout the mass and several satellite foci extending into the right anterior subcutaneous fat. The mass infiltrates wall deep cervical compartments of the right neck including the right submandibular space, right inferior masticator space, right parotid gland, and right carotid sheath. The margins of the mass are poorly defined as a contiguous within fat  stranding present throughout the neck. In comparison with the prior CT of the neck the mass has decreased in size and there is less enhancement. Pharynx and larynx: Increasing bulky mucosal thickening in the hypopharynx predominantly right posteriorly and subjacent to the hyoid bone (series 13, image 49). This mucosal thickening is contiguous with the ill-defined mass in the right anterior neck and may represent tumor invasion. There is narrowing of the airway with a cross-section of 6 x 14 mm. Infiltration of paraglottic fat may represent posttreatment changes, tumor, or edema. Subglottic airway is patent. Salivary glands: Tumor infiltration of the right parotid gland inferiorly both deep and superficial compartments. Complete tumor infiltration of right submandibular gland. Other salivary glands are stable. Thyroid: Subcentimeter left thyroid nodule. Lymph nodes: Enhancing right submandibular and bilateral submental lymph nodes may be metastatic. The right-sided cervical mass spans the entire cervical chain. No left-sided cervical adenopathy. Vascular: Occluded right internal jugular vein. Tumor infiltration of the right carotid sheath without vascular occlusion. Greater than 180 degrees contact upon the upper right common carotid (series 13, image 43). Limited intracranial: Negative. Visualized orbits: Negative. Mastoids and visualized paranasal sinuses: Moderate mucosal thickening of bilateral maxillary sinuses and opacification of bilateral mastoid air cells. Skeleton: No acute osseous abnormality identified. Cervical degenerative changes greatest at the C4 through C6 levels appear Upper chest: Please refer to concurrent CT angiogram of the chest. Other: None. IMPRESSION: 1. Ill-defined mass centered in the right anterior neck with infiltration of the right submandibular space and gland, right inferior masticator space, right parotid gland, and right carotid space, overall decreased  in size and enhancement in  comparison with the prior CT of the neck. Residual enhancing tumor is present. 2. Bilateral submental and right submandibular enhancing lymph nodes are probably metastatic. 3. Increase mucosal thickening throughout the right side greater than left side of the hypopharynx directly contiguous with the right neck mass probably representing a combination of tumor invasion and posttreatment changes. Narrowing of the airway to a 6 x 14 mm cross-section. 4. Diffuse stranding of the subcutaneous fat throughout the neck probably represents anasarca. Electronically Signed   By: Kristine Garbe M.D.   On: 10/13/2016 19:22   Ct Angio Chest Pe W And/or Wo Contrast  Result Date: 10/13/2016 CLINICAL DATA:  Patient brought from nuclear Medicine for possible overdose related to confusion of unknown onset. EXAM: CT ANGIOGRAPHY CHEST WITH CONTRAST TECHNIQUE: Multidetector CT imaging of the chest was performed using the standard protocol during bolus administration of intravenous contrast. Multiplanar CT image reconstructions and MIPs were obtained to evaluate the vascular anatomy. CONTRAST:  80 mL Isovue 370 IV. COMPARISON:  Chest CT 01/03/2016 and PET-CT 06/26/2016 FINDINGS: Left subclavian Port-A-Cath with tip in the SVC. Cardiovascular: Cardiomegaly slightly worse. There is a gradual tapering off of opacification of the distal right lower lobar pulmonary arteries with similar but less pronounced appearance in the distal left lower lobar arteries likely not representing distal emboli. Minimal calcified plaque over the thoracic aorta. The Remaining vascular structures are unremarkable. Mediastinum/Nodes: No significant hilar or mediastinal adenopathy. Remaining mediastinal structures are within normal. Evidence of known right lower neck mass unchanged. Lungs/Pleura: Lungs are adequately inflated demonstrate moderate size bilateral pleural effusions right greater than left with mild associated basilar opacification likely  atelectasis although cannot exclude infection. Stable 3 mm nodule over the right lower lobe. No new nodules. Apical pleural thickening. Airways are within normal. Upper Abdomen: Within normal. Musculoskeletal: Minimal degenerate change of the spine. Review of the MIP images confirms the above findings. IMPRESSION: Moderate size bilateral pleural effusions right greater than left with associated bibasilar opacification likely atelectasis, although cannot exclude infection. No definite pulmonary emboli. Worsening cardiomegaly. Recommend clinical correlation for cardiomyopathy. Residual density over known right neck base mass unchanged. Stable 3 mm nodule right lower lobe. Electronically Signed   By: Marin Olp M.D.   On: 10/13/2016 19:20     Medical Consultants:    None.  Anti-Infectives:     Subjective:    Gina Frederick She is improving is unchanged. Still short of breath with oxygen.  Objective:    Vitals:   10/13/16 2041 10/13/16 2059 10/14/16 0446 10/14/16 0812  BP:  (!) 142/96 (!) 139/92   Pulse:  93 93 88  Resp:  20 20 17   Temp:  98.6 F (37 C) 98.2 F (36.8 C)   TempSrc:  Oral Oral   SpO2: 96% 94% 97% (!) 83%  Weight:  62.1 kg (136 lb 14.4 oz) 62.5 kg (137 lb 11.2 oz)   Height:  5\' 4"  (1.626 m)      Intake/Output Summary (Last 24 hours) at 10/14/16 0842 Last data filed at 10/14/16 0600  Gross per 24 hour  Intake           1567.5 ml  Output              300 ml  Net           1267.5 ml   Filed Weights   10/13/16 0922 10/13/16 2059 10/14/16 0446  Weight: 59 kg (130 lb) 62.1 kg (136  lb 14.4 oz) 62.5 kg (137 lb 11.2 oz)    Exam: General exam: In no acute distress. Respiratory system: Good air movement and clear to auscultation. Cardiovascular system: S1 & S2 heard, RRR. No JVD. Gastrointestinal system: Abdomen is nondistended, soft and nontender.  Central nervous system: Alert and oriented. No focal neurological deficits. Extremities: No pedal edema. Skin:   hyperpigmentation of the skin around the neck Psyquiatry: Judgement and insight appear normal. Mood & affect appropriate.    Data Reviewed:    Labs: Basic Metabolic Panel:  Recent Labs Lab 10/13/16 0937 10/13/16 2105 10/14/16 0519  NA 129*  --  129*  K 3.8  --  3.5  CL 85*  --  87*  CO2 33*  --  33*  GLUCOSE 96  --  108*  BUN 29*  --  27*  CREATININE 1.36* 1.32* 1.26*  CALCIUM 9.3  --  8.6*  MG  --  1.9  --   PHOS  --  3.5  --    GFR Estimated Creatinine Clearance: 41 mL/min (by C-G formula based on SCr of 1.26 mg/dL (H)). Liver Function Tests:  Recent Labs Lab 10/13/16 0937 10/14/16 0519  AST 31 29  ALT 24 23  ALKPHOS 149* 125  BILITOT 1.6* 1.4*  PROT 6.3* 5.8*  ALBUMIN 3.6 3.3*   No results for input(s): LIPASE, AMYLASE in the last 168 hours. No results for input(s): AMMONIA in the last 168 hours. Coagulation profile No results for input(s): INR, PROTIME in the last 168 hours.  CBC:  Recent Labs Lab 10/13/16 0937 10/13/16 2105 10/14/16 0519  WBC 7.7 5.6 6.5  HGB 11.3* 10.7* 10.4*  HCT 35.1* 32.7* 32.4*  MCV 95.6 96.2 97.0  PLT 211 200 201   Cardiac Enzymes: No results for input(s): CKTOTAL, CKMB, CKMBINDEX, TROPONINI in the last 168 hours. BNP (last 3 results) No results for input(s): PROBNP in the last 8760 hours. CBG:  Recent Labs Lab 10/13/16 1007  GLUCAP 87   D-Dimer: No results for input(s): DDIMER in the last 72 hours. Hgb A1c: No results for input(s): HGBA1C in the last 72 hours. Lipid Profile: No results for input(s): CHOL, HDL, LDLCALC, TRIG, CHOLHDL, LDLDIRECT in the last 72 hours. Thyroid function studies:  Recent Labs  10/13/16 2105  TSH 19.717*   Anemia work up: No results for input(s): VITAMINB12, FOLATE, FERRITIN, TIBC, IRON, RETICCTPCT in the last 72 hours. Sepsis Labs:  Recent Labs Lab 10/13/16 0937 10/13/16 2105 10/14/16 0519  WBC 7.7 5.6 6.5   Microbiology No results found for this or any previous  visit (from the past 240 hour(s)).   Medications:   . sodium chloride   Intravenous STAT  . apixaban  5 mg Oral BID  . aspirin EC  81 mg Oral Daily  . atorvastatin  40 mg Oral q1800  . feeding supplement (ENSURE ENLIVE)  237 mL Oral BID BM  . ipratropium-albuterol  3 mL Nebulization TID  . levothyroxine  25 mcg Oral QAC breakfast  . metoprolol succinate  25 mg Oral Daily  . nicotine  14 mg Transdermal Daily  . sodium chloride flush  3 mL Intravenous Q12H   Continuous Infusions:  Time spent: 25 min   LOS: 0 days   Charlynne Cousins  [facility] Pager [phone]  *Please refer to [email or web address], password [email or web address] to get updated schedule on who will round on this patient, as hospitalists switch teams weekly. If 7PM-7AM, please contact night-coverage at [email or web address], password [email or web address] for  any overnight needs.  10/14/2016, 8:42 AM

## 2016-10-14 NOTE — Procedures (Signed)
Successful US guided right thoracentesis. Yielded 750 mL of clear yellow fluid. Pt tolerated procedure well. No immediate complications.  Specimen was sent for labs. CXR ordered.  Ascencion Dike PA-C 10/14/2016 10:47 AM

## 2016-10-14 NOTE — Progress Notes (Signed)
*  PRELIMINARY RESULTS* Echocardiogram 2D Echocardiogram has been performed.  Gina Frederick 10/14/2016, 12:15 PM

## 2016-10-14 NOTE — Progress Notes (Signed)
Patient on RA when RT came to administer breathing tx. Patient 83% on RA. Will place patient on [place] post neb tx. RT will continue to monitor patient.

## 2016-10-14 NOTE — Progress Notes (Signed)
Assumed care of patient at . Agree with previous Nurse assessment.  Claudetta Sallie M. Shaney Deckman, RN  

## 2016-10-15 ENCOUNTER — Encounter (HOSPITAL_COMMUNITY): Payer: Self-pay | Admitting: Cardiology

## 2016-10-15 DIAGNOSIS — I10 Essential (primary) hypertension: Secondary | ICD-10-CM

## 2016-10-15 DIAGNOSIS — I4891 Unspecified atrial fibrillation: Secondary | ICD-10-CM

## 2016-10-15 DIAGNOSIS — J9601 Acute respiratory failure with hypoxia: Secondary | ICD-10-CM

## 2016-10-15 DIAGNOSIS — I5043 Acute on chronic combined systolic (congestive) and diastolic (congestive) heart failure: Secondary | ICD-10-CM

## 2016-10-15 DIAGNOSIS — I480 Paroxysmal atrial fibrillation: Secondary | ICD-10-CM

## 2016-10-15 LAB — BASIC METABOLIC PANEL
ANION GAP: 9 (ref 5–15)
BUN: 23 mg/dL — ABNORMAL HIGH (ref 6–20)
CALCIUM: 8.6 mg/dL — AB (ref 8.9–10.3)
CO2: 32 mmol/L (ref 22–32)
Chloride: 87 mmol/L — ABNORMAL LOW (ref 101–111)
Creatinine, Ser: 1.13 mg/dL — ABNORMAL HIGH (ref 0.44–1.00)
GFR, EST AFRICAN AMERICAN: 60 mL/min — AB (ref >60)
GFR, EST NON AFRICAN AMERICAN: 52 mL/min — AB (ref >60)
Glucose, Bld: 97 mg/dL (ref 65–99)
Potassium: 3.4 mmol/L — ABNORMAL LOW (ref 3.5–5.1)
SODIUM: 128 mmol/L — AB (ref 135–145)

## 2016-10-15 LAB — MAGNESIUM: MAGNESIUM: 1.9 mg/dL (ref 1.7–2.4)

## 2016-10-15 MED ORDER — MAGNESIUM SULFATE 2 GM/50ML IV SOLN
2.0000 g | Freq: Once | INTRAVENOUS | Status: AC
Start: 2016-10-15 — End: 2016-10-15
  Administered 2016-10-15: 2 g via INTRAVENOUS
  Filled 2016-10-15: qty 50

## 2016-10-15 MED ORDER — FUROSEMIDE 10 MG/ML IJ SOLN
20.0000 mg | Freq: Four times a day (QID) | INTRAMUSCULAR | Status: DC
  Administered 2016-10-15 – 2016-10-16 (×3): 20 mg via INTRAVENOUS
  Filled 2016-10-15 (×3): qty 2

## 2016-10-15 MED ORDER — POTASSIUM CHLORIDE CRYS ER 20 MEQ PO TBCR
40.0000 meq | EXTENDED_RELEASE_TABLET | Freq: Two times a day (BID) | ORAL | Status: AC
  Administered 2016-10-15 (×2): 40 meq via ORAL
  Filled 2016-10-15 (×2): qty 2

## 2016-10-15 MED ORDER — LISINOPRIL 10 MG PO TABS
5.0000 mg | ORAL_TABLET | Freq: Every day | ORAL | Status: DC
  Administered 2016-10-16 – 2016-10-19 (×4): 5 mg via ORAL
  Filled 2016-10-15 (×4): qty 1

## 2016-10-15 NOTE — Progress Notes (Signed)
[facility] PROGRESS NOTE    Progress Note  Gina Frederick  [id number] DOB: [birth year] DOA: 10/13/2016 PCP: Royce Macadamia D., PA-C     Brief Narrative:   Gina Frederick is an 60 y.o. 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 Assessment/Plan:   Acute on chronic respiratory failure with hypoxia Due to acute decompensated systolic heart failure: 2-D echo done on 12/17/2015 showed an EF of 10%. S/p thoracocentesis,LDH 48 and protein. Effusions likely due to heart failure Patient requiring O2 he has remained febrile with leukocytosis. Started on Coreg and lisinopril, awaiting 2-D echo. Cont IV lasix. Consult cards might further intervention.  Acute encephalopathy: CT scan of the head was done that showed no acute intracranial findings, UDS was negative and did not show any signs of infections. Sedative Medications were held she has remained afebrile with no leukocytosis. Her encephalopathy is likely due to hypoxia.  Essential hypertension Blood pressures is High.   Anemia in chronic kidney disease (CKD) Hemoglobin at baseline follow-up of PCP as an outpatient.  Paroxysmal atrial fibrillation (HCC) Rate controlled continue apixiban.    Hyponatremia Continue IV Lasix and monitor basic metabolic panel daily. Will supplement with oral potassium.  Hypothyroidism: Continues to recheck TSH and free T4 as an outpatient.   Noncompliance of medication:  DVT prophylaxis: lovenox Family Communication:none Disposition Plan/Barrier to D/C: unable to determine Code Status:     Code Status Orders        Start     Ordered   10/13/16 2042  Full code  Continuous     10/13/16 2041    Code Status History    Date Active Date Inactive Code Status Order ID Comments User Context   03/07/2016  5:27 PM 03/10/2016  7:53 PM DNR [id number]  Allie Bossier, MD Inpatient   03/03/2016 10:23 PM 03/07/2016  5:27 PM Full Code [id number]  Minus Breeding, MD Inpatient        IV Access:    Peripheral IV   Procedures and diagnostic studies:   Dg Chest 1 View  Result Date: 10/14/2016 CLINICAL DATA:  Post thoracentesis. EXAM: CHEST 1 VIEW COMPARISON:  10/13/2016 and chest CT 10/13/2016 FINDINGS: Left-sided Port-A-Cath unchanged. Lungs are adequately inflated with persistent left base opacification likely small effusion with atelectasis without significant change. Improved right base opacification likely improved effusion/atelectasis post thoracentesis. No evidence of pneumothorax. Mild stable cardiomegaly. Remainder of the exam is unchanged. IMPRESSION: Stable left base opacification compatible with small effusion with atelectasis. Improved right base opacification likely improved effusions/ atelectasis post thoracentesis. No pneumothorax. Stable cardiomegaly. Left-sided Port-A-Cath unchanged. Electronically Signed   By: Marin Olp M.D.   On: 10/14/2016 11:13   Dg Chest 2 View  Result Date: 10/13/2016 CLINICAL DATA:  Cough and confusion with UTI. EXAM: CHEST  2 VIEW COMPARISON:  03/03/2016 FINDINGS: Left-sided Port-A-Cath has tip just below the cavoatrial junction. Lungs are adequately inflated demonstrate worsening bibasilar opacification right greater than left likely small effusions with associated atelectasis versus infection. Mild stable cardiomegaly. Remainder of the exam is unchanged. IMPRESSION: Bibasilar opacification compatible with bilateral effusions with associated atelectasis versus infection. Electronically Signed   By: Marin Olp M.D.   On: 10/13/2016 18:45   Ct Head Wo Contrast  Result Date: 10/13/2016 CLINICAL DATA:  Per [name] pt brought from nuclear  medicine for possible overdose related to confusion onset of unknown time. Pt able to verbalize self, place, situation, but time off by more than two calender days. EXAM: CT HEAD WITHOUT CONTRAST TECHNIQUE: Contiguous axial images were obtained from the base of the skull through the vertex without intravenous contrast. COMPARISON:  06/26/2016 PET-CT, 12/17/2015 neck CT FINDINGS: Brain: There is significant central and cortical atrophy. Mild periventricular white matter changes are consistent with small vessel disease. There is no intra or extra-axial fluid collection or mass lesion. The basilar cisterns and ventricles have a normal appearance. There is no CT evidence for acute infarction or hemorrhage. Vascular: There is mild atherosclerotic calcification of the carotid siphons. Skull: Normal. Negative for fracture or focal lesion. Sinuses/Orbits: There is mucoperiosteal thickening of the maxillary sinuses bilaterally. Bilateral mastoid effusions. Other: None IMPRESSION: 1. Atrophy and small vessel disease. 2.  No evidence for acute intracranial abnormality. 3. New bilateral mastoid effusions. 4. Paranasal sinus disease. Electronically Signed   By: Nolon Nations M.D.   On: 10/13/2016 11:37   Ct Soft Tissue Neck W Contrast  Result Date: 10/13/2016 CLINICAL DATA:  60 y/o F; history of squamous cell carcinoma of the neck status post radiation and chemotherapy. Patient currently with altered mental status. For follow-up. EXAM: CT NECK WITH CONTRAST TECHNIQUE: Multidetector CT imaging of the neck was performed using the standard protocol following the bolus administration of intravenous contrast. CONTRAST:  80 cc Isovue 370. COMPARISON:  12/17/2015 CT of the neck and 06/26/2016 PET-CT. FINDINGS: Ill-defined predominantly hypoattenuating/hypoenhancing mass centered in the right anterolateral neck soft tissues spanning approximately 54 x 34 x 65 mm (AP x ML x CC). There are multiple irregular foci of nodular  enhancement throughout the mass and several satellite foci extending into the right anterior subcutaneous fat. The mass infiltrates wall deep cervical compartments of the right neck including the right submandibular space, right inferior masticator space, right parotid gland, and right carotid sheath. The margins of the mass are poorly defined as a contiguous within fat stranding present throughout the neck. In comparison with the prior CT of the neck the mass has decreased in size and there is less enhancement. Pharynx and larynx: Increasing bulky mucosal thickening in the hypopharynx predominantly right posteriorly and subjacent to the hyoid bone (series 13, image 49). This mucosal thickening is contiguous with the ill-defined mass in the right anterior neck and may represent tumor invasion. There is narrowing of the airway with a cross-section of 6 x 14 mm. Infiltration of paraglottic fat may represent posttreatment changes, tumor, or edema. Subglottic airway is patent. Salivary glands: Tumor infiltration of the right parotid gland inferiorly both deep and superficial compartments. Complete tumor infiltration of right submandibular gland. Other salivary glands are stable. Thyroid: Subcentimeter left thyroid nodule. Lymph nodes: Enhancing right submandibular and bilateral submental lymph nodes may be metastatic. The right-sided cervical mass spans the entire cervical chain. No left-sided cervical adenopathy. Vascular: Occluded right internal jugular vein. Tumor infiltration of the right carotid sheath without vascular occlusion. Greater than 180 degrees contact upon the upper right common carotid (series 13, image 43). Limited intracranial: Negative. Visualized orbits: Negative. Mastoids and visualized paranasal sinuses: Moderate mucosal thickening of bilateral maxillary sinuses and opacification of bilateral mastoid air cells. Skeleton: No acute osseous abnormality identified. Cervical degenerative changes  greatest at the C4 through C6 levels appear Upper chest: Please refer to concurrent CT angiogram of the chest.  Other: None. IMPRESSION: 1. Ill-defined mass centered in the right anterior neck with infiltration of the right submandibular space and gland, right inferior masticator space, right parotid gland, and right carotid space, overall decreased in size and enhancement in comparison with the prior CT of the neck. Residual enhancing tumor is present. 2. Bilateral submental and right submandibular enhancing lymph nodes are probably metastatic. 3. Increase mucosal thickening throughout the right side greater than left side of the hypopharynx directly contiguous with the right neck mass probably representing a combination of tumor invasion and posttreatment changes. Narrowing of the airway to a 6 x 14 mm cross-section. 4. Diffuse stranding of the subcutaneous fat throughout the neck probably represents anasarca. Electronically Signed   By: Kristine Garbe M.D.   On: 10/13/2016 19:22   Ct Angio Chest Pe W And/or Wo Contrast  Result Date: 10/13/2016 CLINICAL DATA:  Patient brought from nuclear Medicine for possible overdose related to confusion of unknown onset. EXAM: CT ANGIOGRAPHY CHEST WITH CONTRAST TECHNIQUE: Multidetector CT imaging of the chest was performed using the standard protocol during bolus administration of intravenous contrast. Multiplanar CT image reconstructions and MIPs were obtained to evaluate the vascular anatomy. CONTRAST:  80 mL Isovue 370 IV. COMPARISON:  Chest CT 01/03/2016 and PET-CT 06/26/2016 FINDINGS: Left subclavian Port-A-Cath with tip in the SVC. Cardiovascular: Cardiomegaly slightly worse. There is a gradual tapering off of opacification of the distal right lower lobar pulmonary arteries with similar but less pronounced appearance in the distal left lower lobar arteries likely not representing distal emboli. Minimal calcified plaque over the thoracic aorta. The Remaining  vascular structures are unremarkable. Mediastinum/Nodes: No significant hilar or mediastinal adenopathy. Remaining mediastinal structures are within normal. Evidence of known right lower neck mass unchanged. Lungs/Pleura: Lungs are adequately inflated demonstrate moderate size bilateral pleural effusions right greater than left with mild associated basilar opacification likely atelectasis although cannot exclude infection. Stable 3 mm nodule over the right lower lobe. No new nodules. Apical pleural thickening. Airways are within normal. Upper Abdomen: Within normal. Musculoskeletal: Minimal degenerate change of the spine. Review of the MIP images confirms the above findings. IMPRESSION: Moderate size bilateral pleural effusions right greater than left with associated bibasilar opacification likely atelectasis, although cannot exclude infection. No definite pulmonary emboli. Worsening cardiomegaly. Recommend clinical correlation for cardiomyopathy. Residual density over known right neck base mass unchanged. Stable 3 mm nodule right lower lobe. Electronically Signed   By: Marin Olp M.D.   On: 10/13/2016 19:20   US Thoracentesis Asp Pleural Space W/img Guide  Result Date: 10/14/2016 INDICATION: Shortness of breath. Pleural effusions. Request diagnostic and therapeutic thoracentesis. EXAM: ULTRASOUND GUIDED RIGHT THORACENTESIS MEDICATIONS: None. COMPLICATIONS: None immediate. Postprocedural chest x-ray negative for pneumothorax. PROCEDURE: An ultrasound guided thoracentesis was thoroughly discussed with the patient and questions answered. The benefits, risks, alternatives and complications were also discussed. The patient understands and wishes to proceed with the procedure. Written consent was obtained. Ultrasound was performed to localize and mark an adequate pocket of fluid in the right chest. The area was then prepped and draped in the normal sterile fashion. 1% Lidocaine was used for local anesthesia. Under  ultrasound guidance a Safe-T-Centesis catheter was introduced. Thoracentesis was performed. The catheter was removed and a dressing applied. FINDINGS: A total of approximately 750 mL of clear yellow fluid was removed. Samples were sent to the laboratory as requested by the clinical team. IMPRESSION: Successful ultrasound guided right thoracentesis yielding 750 mL of pleural fluid. Read by: Ascencion Dike PA-C  Electronically Signed   By: Sandi Mariscal M.D.   On: 10/14/2016 11:10     Medical Consultants:    None.  Anti-Infectives:     Subjective:    Gina Frederick She is improving is improved.   Objective:    Vitals:   10/15/16 0536 10/15/16 0600 10/15/16 0819 10/15/16 0902  BP: (!) 143/98   128/90  Pulse: 76  87 99  Resp: 18  18   Temp: 97.4 F (36.3 C)     TempSrc: Oral     SpO2: 100%  100%   Weight:  60.2 kg (132 lb 11.5 oz)    Height:        Intake/Output Summary (Last 24 hours) at 10/15/16 0957 Last data filed at 10/15/16 0600  Gross per 24 hour  Intake             1080 ml  Output              800 ml  Net              280 ml   Filed Weights   10/13/16 2059 10/14/16 0446 10/15/16 0600  Weight: 62.1 kg (136 lb 14.4 oz) 62.5 kg (137 lb 11.2 oz) 60.2 kg (132 lb 11.5 oz)    Exam: General exam: In no acute distress. Respiratory system: Good air movement and clear to auscultation. Cardiovascular system: S1 & S2 heard, RRR. No JVD. Gastrointestinal system: Abdomen is nondistended, soft and nontender.  Extremities: No pedal edema. Skin:  hyperpigmentation of the skin around the neck Psyquiatry: Judgement and insight appear normal. Mood & affect appropriate.    Data Reviewed:    Labs: Basic Metabolic Panel:  Recent Labs Lab 10/13/16 0937 10/13/16 2105 10/14/16 0519 10/15/16 0543  NA 129*  --  129* 128*  K 3.8  --  3.5 3.4*  CL 85*  --  87* 87*  CO2 33*  --  33* 32  GLUCOSE 96  --  108* 97  BUN 29*  --  27* 23*  CREATININE 1.36* 1.32* 1.26* 1.13*    CALCIUM 9.3  --  8.6* 8.6*  MG  --  1.9  --   --   PHOS  --  3.5  --   --    GFR Estimated Creatinine Clearance: 45.7 mL/min (by C-G formula based on SCr of 1.13 mg/dL (H)). Liver Function Tests:  Recent Labs Lab 10/13/16 0937 10/14/16 0519 10/14/16 0848  AST 31 29 29   ALT 24 23 24   ALKPHOS 149* 125 127*  BILITOT 1.6* 1.4* 1.5*  PROT 6.3* 5.8* 5.5*  ALBUMIN 3.6 3.3* 3.1*   No results for input(s): LIPASE, AMYLASE in the last 168 hours. No results for input(s): AMMONIA in the last 168 hours. Coagulation profile No results for input(s): INR, PROTIME in the last 168 hours.  CBC:  Recent Labs Lab 10/13/16 0937 10/13/16 2105 10/14/16 0519  WBC 7.7 5.6 6.5  HGB 11.3* 10.7* 10.4*  HCT 35.1* 32.7* 32.4*  MCV 95.6 96.2 97.0  PLT 211 200 201   Cardiac Enzymes: No results for input(s): CKTOTAL, CKMB, CKMBINDEX, TROPONINI in the last 168 hours. BNP (last 3 results) No results for input(s): PROBNP in the last 8760 hours. CBG:  Recent Labs Lab 10/13/16 1007  GLUCAP 87   D-Dimer: No results for input(s): DDIMER in the last 72 hours. Hgb A1c: No results for input(s): HGBA1C in the last 72 hours. Lipid Profile: No results for input(s): CHOL, HDL, LDLCALC,  TRIG, CHOLHDL, LDLDIRECT in the last 72 hours. Thyroid function studies:  Recent Labs  10/13/16 2105  TSH 19.717*   Anemia work up: No results for input(s): VITAMINB12, FOLATE, FERRITIN, TIBC, IRON, RETICCTPCT in the last 72 hours. Sepsis Labs:  Recent Labs Lab 10/13/16 [id number] 10/13/16 2105 10/14/16 0519  WBC 7.7 5.6 6.5   Microbiology Recent Results (from the past 240 hour(s))  Gram stain     Status: None   Collection Time: 10/14/16 11:00 AM  Result Value Ref Range Status   Specimen Description PLEURAL RIGHT  Final   Special Requests NONE  Final   Gram Stain   Final    WBC PRESENT,BOTH PMN AND MONONUCLEAR NO ORGANISMS SEEN CYTOSPIN SMEAR Performed at [facility]    Report Status  10/14/2016 FINAL  Final     Medications:   . apixaban  5 mg Oral BID  . aspirin EC  81 mg Oral Daily  . atorvastatin  40 mg Oral q1800  . carvedilol  3.125 mg Oral BID WC  . feeding supplement (ENSURE ENLIVE)  237 mL Oral BID BM  . furosemide  20 mg Intravenous Q12H  . ipratropium-albuterol  3 mL Nebulization TID  . levothyroxine  25 mcg Oral QAC breakfast  . lisinopril  2.5 mg Oral Daily  . nicotine  14 mg Transdermal Daily  . sodium chloride flush  3 mL Intravenous Q12H   Continuous Infusions:  Time spent: 25 min   LOS: 1 day   Charlynne Cousins  [facility] Pager [phone]  *Please refer to [email or web address], password [email or web address] to get updated schedule on who will round on this patient, as hospitalists switch teams weekly. If 7PM-7AM, please contact night-coverage at [email or web address], password [email or web address] for any overnight needs.  10/15/2016, 9:57 AM

## 2016-10-15 NOTE — Consult Note (Signed)
CARDIOLOGY CONSULT NOTE  Patient ID: Gina Frederick MRN: [id number] DOB/AGE: 1956/01/20 60 y.o.  Admit date: 10/13/2016 Primary Physician MUSE,ROCHELLE D., PA-C Primary Cardiologist :  Dr. Domenic Polite Chief Complaint    Dyspnea Requesting  Dr. Olevia Bowens  HPI:   She presented to the ED with AMS.  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  She was brought in because she was confused.  At the time of presentation she was noted to be hypoxemic with saturations of 83% on RA.   She was hypertensive on presentation.   BNP was greater than 4500.  CXR and CT demonstrated moderate bilateral pleural effusions.   She is now status post thoracentesis.   She is unable to give me any details about her recent symptoms although she says that she has been SOB with non productive cough.  She cannot describe the onset or duration.  She has throat pain related to cancer and therapy.  She has a cough but cannot bring of phlegm.  She is not describing PND or orthopnea.  She is not reporting fevers or chills.  She says that she does have leg swelling.  She does not report chest or arm pain.  She is not reporting palpitations presyncope or syncope.   She has a history of a cardiomyopathy of unclear etiology.  She was seen in April with HF and elevated troponin.  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 Her last EF was said to be 40 - 45% on echo in August of this year.  (Visually it looks lower than that when I review this echo).  Now her EF is reduced and found to be 15% which is clearly lower than the prior.  There is moderate MR and severe TR with elevated peak pulmonary pressures.       Past Medical History:  Diagnosis Date  . Arthritis   . Centrilobular emphysema ([place])   . Edentulous    Multiple dental extractions March 2017  . Essential hypertension   . Hyperlipidemia   . Ischemic cardiomyopathy    LVEF 40-45%  . NSTEMI (non-ST elevated myocardial infarction) [facility])    April 2017 - managed medically due to comorbid illnesses  . PAF (paroxysmal atrial fibrillation) ([place])   . 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 Whitney Muse    Past Surgical History:  Procedure Laterality Date  . CESAREAN SECTION    . IR GENERIC HISTORICAL  08/07/2016   IR PATIENT EVAL TECH 0-60 MINS [facility]-INTERV RAD  . KNEE ARTHROSCOPY Right   . MASS BIOPSY Right 12/21/2015   Procedure: NECK MASS BIOPSY;  Surgeon: Leta Baptist, MD;  Location: [place];  Service: ENT;  Laterality: Right;  . MULTIPLE EXTRACTIONS WITH ALVEOLOPLASTY N/A 01/13/2016   Procedure: EXTRACTION OF TOOTH #'S 6-12,15, 19-28, 30 WITH ALVEOLOPLASTY AND BILATERAL MAXILLARY TUBEROSITY REDUCTIONS;  Surgeon: Lenn Cal, DDS;  Location: [facility];  Service: Oral Surgery;  Laterality: N/A;  . PANENDOSCOPY N/A 12/21/2015   Procedure: PANENDOSCOPY;  Surgeon: Leta Baptist, MD;  Location: [place];  Service: ENT;  Laterality: N/A;  . TONSILLECTOMY      Allergies  Allergen Reactions  . Codeine Nausea Only   Prescriptions Prior to Admission  Medication Sig Dispense Refill Last Dose  . acetaminophen (TYLENOL) 500 MG tablet Take 1,000 mg by mouth every 6 (six) hours as needed for mild pain or moderate pain.    unknown  . apixaban (ELIQUIS) 5 MG TABS tablet Take 1 tablet (5 mg total) by mouth 2 (two) times daily. 60 tablet 3 unknown  . atorvastatin (LIPITOR) 40 MG tablet Take 1 tablet (40 mg total) by mouth daily at 6 PM. 30 tablet 3 unknown  . diphenhydrAMINE (BENADRYL) 25 MG tablet Take 25 mg by mouth every 6 (six) hours as needed. Reported on 03/13/2016   unknown  . levothyroxine (SYNTHROID, LEVOTHROID) 25 MCG tablet TAKE ONE TABLET EACH MORNING BEFORE BREAKFAST 30 tablet 4 unknown  .  lidocaine-prilocaine (EMLA) cream Apply a quarter size amount to port site 1 hour prior to chemo. Do not rub in. Cover with plastic wrap. 30 g 3 Taking  . LORazepam (ATIVAN) 0.5 MG tablet Take 1 tablet (0.5 mg total) by mouth every 4 (four) hours as needed for anxiety (one tab every 4-6 hours as needed for nausea and vomiting). 45 tablet 1 unknown  . metoprolol succinate (TOPROL XL) 25 MG 24 hr tablet Take 1 tablet (25 mg total) by mouth daily. 90 tablet 3 unknown  . nitroGLYCERIN (NITROSTAT) 0.4 MG SL tablet Place 1 tablet (0.4 mg total) under the tongue every 5 (five) minutes x 3 doses as needed for chest pain. 15 tablet 0 unknown  . ondansetron (ZOFRAN-ODT) 4 MG disintegrating tablet TAKE ONE TABLET BY MOUTH EVERY 8 HOURS. AS NEEDED FOR NAUSEA AND VOMITING 20 tablet 3 unknown  . POT BICARB-POT CHLORIDE,25MEQ, 25 MEQ TBEF Take 1.6 tablets (40 mEq total) by mouth daily. 30 tablet 2 unknown  . prochlorperazine (COMPAZINE) 10 MG tablet Take 1 tablet (10 mg total) by mouth every 6 (six) hours as needed for nausea or vomiting. 30 tablet 2 unknown  . zolpidem (AMBIEN) 10 MG tablet Take 1 tablet (10 mg total) by mouth at bedtime as needed for sleep. 30 tablet 1 unknown  . benzonatate (TESSALON) 100 MG capsule Take 1 capsule (100 mg total) by mouth 3 (three) times daily as needed for cough. (Patient not taking: Reported on 10/13/2016) 20 capsule 0 Completed Course at Unknown time  . feeding supplement, ENSURE ENLIVE, (ENSURE ENLIVE) LIQD Take 237 mLs by mouth 2 (two) times daily between meals. 237 mL 0 Taking  . prochlorperazine (COMPAZINE) 5 MG tablet Take 1 tablet (5 mg total) by mouth every 6 (six) hours as needed for nausea or vomiting. (Patient not taking: Reported on 10/13/2016) 30 tablet 1 Not Taking at dose   Family History  Problem Relation Age of Onset  . Alzheimer's disease Mother   . Congestive Heart Failure Father 64    Social History   Social History  . Marital status: Divorced    Spouse  name: N/A  . Number of children: 1  . Years of education: N/A   Occupational History  . Not on file.   Social History Main Topics  . Smoking status: Current Every Day Smoker    Packs/day: 0.10    Years: 20.00    Types: Cigarettes  . Smokeless tobacco: Never Used     Comment: She took a puff two days ago  . Alcohol use No  . Drug use: No  . Sexual activity: No   Other Topics Concern  . 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      ROS:  Neck pain and weakness, cold intolerance and decreased memory.  Otherwise , as stated in the HPI and negative for all other systems.  Physical Exam: Blood pressure 128/90, pulse 99, temperature 97.4 F (36.3 C), temperature source Oral, resp. rate 18, height 5\' 4"  (1.626 m), weight 132 lb 11.5 oz (60.2 kg), SpO2 100 %.  GENERAL:  Well appearing HEENT:  Pupils equal round and reactive, fundi not visualized, edentulous NECK:  Unable to adequately assess jugular venous distention, waveform within normal limits, carotid upstroke brisk and symmetric, no bruits.  Large mass with radiation skin changes and tenderness LYMPHATICS:  No cervical, inguinal adenopathy LUNGS:  Decreased breath sound bilateral BACK:  No CVA tenderness CHEST:  Unremarkable HEART:  PMI not displaced or sustained,S1 and S2 within normal limits, no S3, no S4, no clicks, no rubs, 2/6 apical holosystolic murmur, no diastolic murmurs ABD:  Flat, positive bowel sounds normal in frequency in pitch, no bruits, no rebound, no guarding, no midline pulsatile mass, positive hepatomegaly, no splenomegaly, feeding tube EXT:  2 plus pulses throughout, no edema, no cyanosis no clubbing SKIN:  No rashes no nodules NEURO:  Cranial nerves II through XII grossly intact, motor grossly intact throughout, muscle wasting  PSYCH:  Cognitively intact, oriented to person and place but not time.  Labs: Lab Results  Component Value Date   BUN 23 (H) 10/15/2016    Lab Results  Component Value Date   CREATININE 1.13 (H) 10/15/2016   Lab Results  Component Value Date   NA 128 (L) 10/15/2016   K 3.4 (L) 10/15/2016   CL 87 (L) 10/15/2016   CO2 32 10/15/2016    Lab Results  Component Value Date   WBC 6.5 10/14/2016   HGB 10.4 (L) 10/14/2016   HCT 32.4 (L) 10/14/2016   MCV 97.0 10/14/2016   PLT 201 10/14/2016   No results found for: CHOL, HDL, LDLCALC, LDLDIRECT, TRIG, CHOLHDL Lab Results  Component Value Date   ALT 24 10/14/2016   AST 29 10/14/2016   ALKPHOS 127 (H) 10/14/2016   BILITOT 1.5 (H) 10/14/2016   Echo:   - Left ventricle: The cavity size was normal. Wall thickness was   increased in a pattern of moderate LVH. The estimated ejection   fraction was in the range of 10% to 15%. Diffuse hypokinesis.   Features are consistent with a pseudonormal left ventricular   filling pattern, with concomitant abnormal relaxation and   increased filling pressure (grade 2 diastolic dysfunction).   Doppler parameters are consistent with high ventricular filling   pressure. - Aortic valve: Mildly to moderately calcified annulus. Mildly   thickened leaflets. There was mild regurgitation. Valve area   (VTI): 1.53 cm^2. Valve area (Vmax): 2.03 cm^2. - Mitral valve: Mildly calcified annulus. Mildly thickened leaflets   . There was moderate regurgitation. The MR VC is 0.5 cm. - Left atrium: The atrium was moderately dilated. - Right ventricle: The cavity size was mildly dilated. - Right atrium: The atrium was mildly dilated. - Tricuspid valve: There was moderate-severe regurgitation. - Pulmonary arteries: Systolic pressure was moderately increased.   PA peak pressure: 64 mm Hg (S). - Technically adequate study.    Radiology:   CXR: Left-sided Port-A-Cath unchanged. Lungs are adequately inflated with persistent left base opacification likely small effusion with atelectasis without significant change. Improved right base opacification  likely improved effusion/atelectasis post thoracentesis. No evidence of pneumothorax. Mild stable cardiomegaly. Remainder of the  exam is unchanged.  EKG:  Sinus, rate 90, LAD, poor anterior R wave progression, ventricular ectopy.    ASSESSMENT AND PLAN:   ACUTE ON CHRONIC SYSTOLIC AND DIASTOLIC HF:   EF 0000000 by echo. I have reviewed these images.  She has very very severe global hypokinesis that has progressed since her echo earlier this year.  (Although, I do think that the EF was lower than 40% at that time.)  She essentially is in low output heart failure.  Some of this may be related to her hypothyroidism which continues to need to be corrected.  It is also likely to be ischemia although the global nature of the dysfunction would suggest other mechanisms at work as well.  She well may need advanced therapies and right and left cath.  However, it is very appropriate to understand the nature of her neck cancer and further planned therapies.  We need to consider right and left heart cath pending an understanding of her clinical picture in total.  I do note that there have been many cancelled and no show appts.  Before proceeding with further invasive cardiac work up I would request that the primary team have a conference with her family or POA and include oncology to understand the big picture.   I agree with Coreg and ACE inhibitor.  I would use diuretics very cautiously as this is likely more low output failure with pulmonary HTN and she is likely to become prerenal quickly.     ATRIAL FIB:  NSR on tele.  She continues Eliquis.  Stop ASA   HTN:   This needs to be managed in the context of treating her cardiomyopathy.    HYPONATREMIA:  Per primary team.  Question SIADH along with her CHF.  Fluid restrict.   HYPOTHYROIDISM:  TSH 19.    TOBACCO ABUSE:  She was still smoking on admission.   SignedMinus Breeding 10/15/2016, 1:26 PM

## 2016-10-16 DIAGNOSIS — C760 Malignant neoplasm of head, face and neck: Secondary | ICD-10-CM

## 2016-10-16 DIAGNOSIS — J9621 Acute and chronic respiratory failure with hypoxia: Secondary | ICD-10-CM

## 2016-10-16 LAB — BASIC METABOLIC PANEL
Anion gap: 8 (ref 5–15)
BUN: 20 mg/dL (ref 6–20)
CALCIUM: 8.4 mg/dL — AB (ref 8.9–10.3)
CO2: 32 mmol/L (ref 22–32)
CREATININE: 0.86 mg/dL (ref 0.44–1.00)
Chloride: 90 mmol/L — ABNORMAL LOW (ref 101–111)
GFR calc Af Amer: >60 mL/min (ref >60)
GLUCOSE: 102 mg/dL — AB (ref 65–99)
Potassium: 4 mmol/L (ref 3.5–5.1)
Sodium: 130 mmol/L — ABNORMAL LOW (ref 135–145)

## 2016-10-16 LAB — PH, BODY FLUID: PH, BODY FLUID: 7.6

## 2016-10-16 MED ORDER — FUROSEMIDE 10 MG/ML IJ SOLN
20.0000 mg | Freq: Four times a day (QID) | INTRAMUSCULAR | Status: DC
  Administered 2016-10-16 – 2016-10-18 (×8): 20 mg via INTRAVENOUS
  Filled 2016-10-16 (×8): qty 2

## 2016-10-16 MED ORDER — FUROSEMIDE 10 MG/ML IJ SOLN: 40.0000 mg | Freq: Four times a day (QID) | INTRAMUSCULAR | Status: DC

## 2016-10-16 NOTE — Progress Notes (Signed)
Patient Name: Gina Frederick Date of Encounter: 10/16/2016  Primary Cardiologist: Dr [facility] Problem List     Principal Problem:   Acute on chronic respiratory failure with hypoxia [facility]) Active Problems:   Squamous cell carcinoma of head and neck (HCC)   Tobacco use   Acute on chronic combined systolic and diastolic congestive heart failure (HCC)   Essential hypertension   Chronic obstructive pulmonary disease (HCC)   Anemia in chronic kidney disease (CKD)   Acute metabolic encephalopathy   Paroxysmal atrial fibrillation (HCC)   Hyponatremia   Hypothyroidism     Subjective   Up in bed eating breakfast, still SOB with any activity.  Inpatient Medications    Scheduled Meds: . apixaban  5 mg Oral BID  . aspirin EC  81 mg Oral Daily  . atorvastatin  40 mg Oral q1800  . carvedilol  3.125 mg Oral BID WC  . feeding supplement (ENSURE ENLIVE)  237 mL Oral BID BM  . furosemide  20 mg Intravenous Q6H  . ipratropium-albuterol  3 mL Nebulization TID  . levothyroxine  25 mcg Oral QAC breakfast  . lisinopril  5 mg Oral Daily  . nicotine  14 mg Transdermal Daily  . sodium chloride flush  3 mL Intravenous Q12H   Continuous Infusions:  PRN Meds: acetaminophen **OR** acetaminophen, nitroGLYCERIN, ondansetron **OR** ondansetron (ZOFRAN) IV, ondansetron, prochlorperazine   Vital Signs    Vitals:   10/15/16 1522 10/15/16 2114 10/15/16 2200 10/16/16 0437  BP: 137/81 116/74  127/85  Pulse: 76 76  70  Resp: 18 18  18   Temp: 97.6 F (36.4 C) 98.6 F (37 C)  98.2 F (36.8 C)  TempSrc: Oral Oral  Oral  SpO2: 100% 97% 97% 100%  Weight:    132 lb 1.6 oz (59.9 kg)  Height:        Intake/Output Summary (Last 24 hours) at 10/16/16 0756 Last data filed at 10/16/16 0600  Gross per 24 hour  Intake              770 ml  Output             1300 ml  Net             -530 ml   Filed Weights   10/14/16 0446 10/15/16 0600 10/16/16 0437  Weight: 137 lb 11.2 oz (62.5 kg)  132 lb 11.5 oz (60.2 kg) 132 lb 1.6 oz (59.9 kg)    Physical Exam    GEN: chronically ill appearing female in no acute distress, on O2  HEENT: Grossly normal.  Neck: scar Rt neck Cardiac: RRR, no murmurs, rubs, or gallops. No clubbing, cyanosis, edema.  Radials/DP/PT 2+ and equal bilaterally.  Respiratory:  Mild respiratory difficulty at rest, on O2, decreased breath sounds 1/2 up on Rt with dullness to percussion Extrem: no LE edema noted MS: no deformity or atrophy. Skin: warm and dry, no rash. Neuro:  Strength and sensation are intact. Psych: AAOx3.  Normal affect.  Labs    CBC  Recent Labs  10/13/16 2105 10/14/16 0519  WBC 5.6 6.5  HGB 10.7* 10.4*  HCT 32.7* 32.4*  MCV 96.2 97.0  PLT 200 [id number]   Basic Metabolic Panel  Recent Labs  10/13/16 2105  10/15/16 0543 10/15/16 1150 10/16/16 0452  NA  --   < > 128*  --  130*  K  --   < > 3.4*  --  4.0  CL  --   < >  87*  --  90*  CO2  --   < > 32  --  32  GLUCOSE  --   < > 97  --  102*  BUN  --   < > 23*  --  20  CREATININE 1.32*  < > 1.13*  --  0.86  CALCIUM  --   < > 8.6*  --  8.4*  MG 1.9  --   --  1.9  --   PHOS 3.5  --   --   --   --   < > = values in this interval not displayed. Liver Function Tests  Recent Labs  10/14/16 0519 10/14/16 0848  AST 29 29  ALT 23 24  ALKPHOS 125 127*  BILITOT 1.4* 1.5*  PROT 5.8* 5.5*  ALBUMIN 3.3* 3.1*    Recent Labs  10/13/16 2105  TSH 19.717*    Telemetry    NSR - Personally Reviewed  ECG    10/13/16- NSR, PVCs, LVH- Personally Reviewed  Radiology    Dg Chest 1 View  Result Date: 10/14/2016 CLINICAL DATA:  Post thoracentesis. EXAM: CHEST 1 VIEW COMPARISON:  10/13/2016 and chest CT 10/13/2016 FINDINGS: Left-sided Port-A-Cath unchanged. Lungs are adequately inflated with persistent left base opacification likely small effusion with atelectasis without significant change. Improved right base opacification likely improved effusion/atelectasis post thoracentesis.  No evidence of pneumothorax. Mild stable cardiomegaly. Remainder of the exam is unchanged. IMPRESSION: Stable left base opacification compatible with small effusion with atelectasis. Improved right base opacification likely improved effusions/ atelectasis post thoracentesis. No pneumothorax. Stable cardiomegaly. Left-sided Port-A-Cath unchanged. Electronically Signed   By: Marin Olp M.D.   On: 10/14/2016 11:13   US Thoracentesis Asp Pleural Space W/img Guide  Result Date: 10/14/2016 INDICATION: Shortness of breath. Pleural effusions. Request diagnostic and therapeutic thoracentesis. EXAM: ULTRASOUND GUIDED RIGHT THORACENTESIS MEDICATIONS: None. COMPLICATIONS: None immediate. Postprocedural chest x-ray negative for pneumothorax. PROCEDURE: An ultrasound guided thoracentesis was thoroughly discussed with the patient and questions answered. The benefits, risks, alternatives and complications were also discussed. The patient understands and wishes to proceed with the procedure. Written consent was obtained. Ultrasound was performed to localize and mark an adequate pocket of fluid in the right chest. The area was then prepped and draped in the normal sterile fashion. 1% Lidocaine was used for local anesthesia. Under ultrasound guidance a Safe-T-Centesis catheter was introduced. Thoracentesis was performed. The catheter was removed and a dressing applied. FINDINGS: A total of approximately 750 mL of clear yellow fluid was removed. Samples were sent to the laboratory as requested by the clinical team. IMPRESSION: Successful ultrasound guided right thoracentesis yielding 750 mL of pleural fluid. Read by: Ascencion Dike PA-C Electronically Signed   By: Sandi Mariscal M.D.   On: 10/14/2016 11:10    Cardiac Studies   Echo 10/14/16- Study Conclusions  - Left ventricle: The cavity size was normal. Wall thickness was   increased in a pattern of moderate LVH. The estimated ejection   fraction was in the range of 10%  to 15%. Diffuse hypokinesis.   Features are consistent with a pseudonormal left ventricular   filling pattern, with concomitant abnormal relaxation and   increased filling pressure (grade 2 diastolic dysfunction).   Doppler parameters are consistent with high ventricular filling   pressure. - Aortic valve: Mildly to moderately calcified annulus. Mildly   thickened leaflets. There was mild regurgitation. Valve area   (VTI): 1.53 cm^2. Valve area (Vmax): 2.03 cm^2. - Mitral valve: Mildly calcified  annulus. Mildly thickened leaflets   . There was moderate regurgitation. The MR VC is 0.5 cm. - Left atrium: The atrium was moderately dilated. - Right ventricle: The cavity size was mildly dilated. - Right atrium: The atrium was mildly dilated. - Tricuspid valve: There was moderate-severe regurgitation. - Pulmonary arteries: Systolic pressure was moderately increased.   PA peak pressure: 64 mm Hg (S). - Technically adequate study.  Patient Profile      60 y/o female with a history of NSTEMI Aprill 2017-(treated conservativly secondary to co morbidities), PAF on Eliquis, cardiomyopathy, COPD, and a history of head and neck cancer, admitted 10/13/16 hypoxemic, hypertensive, and confused. BNP was greater than 4500.  CXR and CT demonstrated moderate bilateral pleural effusions. She underwent thoracentesis 12/9 (750cc). Her last EF was 40 - 45% on echo in July 2017, (visually it appeared less per Dr [facility]).  Now her EF is reduced and found to be 15% which is clearly lower than the prior.  There is moderate MR and severe TR .   Assessment & Plan    1. Acute on chronic respiratory failure  2. Acute on chronic combined CHF- BNP > 4500 on adm, s/p thoracentesis 10/14/16- 750cc, recurrent Rt sided effusion on exam today  3. H/O PAF- holding NSR, CHADs VASc=4, on Eliquis p.t.a.  4. Cardiomyopathy-EF 10-15% by echo 10/14/16 with MR and TR, new drop in EF c/w July 2017.   5. Hypothyroidism TSH 19 on adm   6.  Hx of head and neck cancer- treated with chemo and radiation Rx. Followed by Dr Whitney Muse.   7. History of NSTEMI- admitted from cancer center April 2017 with respiratory failure, PAF, acute renal insufficiency, Troponin pk 23.06. Plan was for conservative Rx.   8. HTN- B/P on adm- 142/96, now 116/74  Plan: She is on Lasix 20 mg IV Q6- her I/O is + 1L since adm but negative 500cc last 24 hrs, her wgt is up 2 lbs from adm. Consider more aggressive diuresis. MD to see.   Signed, Kerin Ransom, PA-C  10/16/2016, 7:56 AM   Personally seen and examined. Agree with above.  Would not perform cardiac cath. Discussed with patient. She agrees. Her mortality is increased. Treating medically. Palliative care recommend. Discussed with Dr. Olevia Bowens. Will change to 40mg  IV BID. Watch BP and creat.   Candee Furbish, MD

## 2016-10-16 NOTE — Plan of Care (Signed)
Problem: Pain Managment: Goal: General experience of comfort will improve Outcome: Completed/Met Date Met: 10/16/16 No c/o pain.  Problem: Nutrition: Goal: Adequate nutrition will be maintained Outcome: Progressing .

## 2016-10-16 NOTE — Progress Notes (Addendum)
[facility] PROGRESS NOTE    Progress Note  Gina Frederick  [id number] DOB: 07/08/1956 DOA: 10/13/2016 PCP: Royce Macadamia D., PA-C     Brief Narrative:   Gina Frederick is an 61 y.o. 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 Assessment/Plan:   Acute on chronic respiratory failure with hypoxia Due to acute decompensated systolic heart failure: 2-D echo done on 12/17/2015 showed an EF of 10%. S/p thoracocentesis, Effusions likely due to heart failure. Cont. on Coreg and lisinopril, awaiting 2-D echo. Cont IV lasix per cards. Cards recommended conservative management, as she is not a candidate for aggressive intervention. I agree with with cards due to her multiple co-morbidities and advance SSC, it would best to cont conservative management. Consult PMT.  AKI: Likely due to cardiorenal syndrome. Cr is improving.  Acute encephalopathy: CT scan of the head was done that showed no acute intracranial findings, UDS was negative and did not show any signs of infections. Sedative Medications were held she has remained afebrile with no leukocytosis. Her encephalopathy is likely due to hypoxia.  Essential hypertension Blood pressures is improved.   Normocytic anemia: Hemoglobin at baseline follow-up of PCP as an outpatient.  Paroxysmal atrial fibrillation (HCC) Rate controlled continue apixiban.    Hyponatremia Improved with diuresis. Continue IV Lasix and monitor basic metabolic panel daily.   Hypothyroidism: Continue synthroid to recheck TSH and free T4 as an outpatient.  ? Due to non compliance.  Noncompliance of medication:  DVT prophylaxis: lovenox Family  Communication:none Disposition Plan/Barrier to D/C: unable to determine Code Status:     Code Status Orders        Start     Ordered   10/13/16 2042  Full code  Continuous     10/13/16 2041    Code Status History    Date Active Date Inactive Code Status Order ID Comments User Context   03/07/2016  5:27 PM 03/10/2016  7:53 PM DNR [id number]  Allie Bossier, MD Inpatient   03/03/2016 10:23 PM 03/07/2016  5:27 PM Full Code [id number]  Minus Breeding, MD Inpatient        IV Access:    Peripheral IV   Procedures and diagnostic studies:   No results found.   Medical Consultants:    None.  Anti-Infectives:     Subjective:    Gina Frederick Breathing comfortable.   Objective:    Vitals:   10/15/16 2200 10/16/16 0437 10/16/16 0821 10/16/16 0927  BP:  127/85    Pulse:  70 80   Resp:  18    Temp:  98.2 F (36.8 C)    TempSrc:  Oral    SpO2: 97% 100%  96%  Weight:  59.9 kg (132 lb 1.6 oz)    Height:        Intake/Output Summary (Last 24 hours) at 10/16/16 1154 Last data filed at 10/16/16 0600  Gross per 24 hour  Intake              770 ml  Output             1300 ml  Net             -530 ml   [employer]  10/14/16 0446 10/15/16 0600 10/16/16 0437  Weight: 62.5 kg (137 lb 11.2 oz) 60.2 kg (132 lb 11.5 oz) 59.9 kg (132 lb 1.6 oz)    Exam: General exam: In no acute distress. Respiratory system: Good air movement and clear to auscultation. Cardiovascular system: S1 & S2 heard, RRR.  Gastrointestinal system: Abdomen is nondistended, soft and nontender.  Extremities: No pedal edema. Skin:  hypogmentation of the skin around the neck Psyquiatry: Judgement and insight appear normal. Mood & affect appropriate.    Data Reviewed:    Labs: Basic Metabolic Panel:  Recent Labs Lab 10/13/16 0937 10/13/16 2105 10/14/16 0519 10/15/16 0543 10/15/16 1150 10/16/16 0452  NA 129*  --  129* 128*  --  130*  K 3.8  --  3.5 3.4*  --  4.0  CL 85*  --  87* 87*   --  90*  CO2 33*  --  33* 32  --  32  GLUCOSE 96  --  108* 97  --  102*  BUN 29*  --  27* 23*  --  20  CREATININE 1.36* 1.32* 1.26* 1.13*  --  0.86  CALCIUM 9.3  --  8.6* 8.6*  --  8.4*  MG  --  1.9  --   --  1.9  --   PHOS  --  3.5  --   --   --   --    GFR Estimated Creatinine Clearance: 60.1 mL/min (by C-G formula based on SCr of 0.86 mg/dL). Liver Function Tests:  Recent Labs Lab 10/13/16 0937 10/14/16 0519 10/14/16 0848  AST 31 29 29   ALT 24 23 24   ALKPHOS 149* 125 127*  BILITOT 1.6* 1.4* 1.5*  PROT 6.3* 5.8* 5.5*  ALBUMIN 3.6 3.3* 3.1*   No results for input(s): LIPASE, AMYLASE in the last 168 hours. No results for input(s): AMMONIA in the last 168 hours. Coagulation profile No results for input(s): INR, PROTIME in the last 168 hours.  CBC:  Recent Labs Lab 10/13/16 0937 10/13/16 2105 10/14/16 0519  WBC 7.7 5.6 6.5  HGB 11.3* 10.7* 10.4*  HCT 35.1* 32.7* 32.4*  MCV 95.6 96.2 97.0  PLT 211 200 201   Cardiac Enzymes: No results for input(s): CKTOTAL, CKMB, CKMBINDEX, TROPONINI in the last 168 hours. BNP (last 3 results) No results for input(s): PROBNP in the last 8760 hours. CBG:  Recent Labs Lab 10/13/16 1007  GLUCAP 87   D-Dimer: No results for input(s): DDIMER in the last 72 hours. Hgb A1c: No results for input(s): HGBA1C in the last 72 hours. Lipid Profile: No results for input(s): CHOL, HDL, LDLCALC, TRIG, CHOLHDL, LDLDIRECT in the last 72 hours. Thyroid function studies:  Recent Labs  10/13/16 2105  TSH 19.717*   Anemia work up: No results for input(s): VITAMINB12, FOLATE, FERRITIN, TIBC, IRON, RETICCTPCT in the last 72 hours. Sepsis Labs:  Recent Labs Lab 10/13/16 [id number] 10/13/16 2105 10/14/16 0519  WBC 7.7 5.6 6.5   Microbiology Recent Results (from the past 240 hour(s))  Culture, body fluid-bottle     Status: None (Preliminary result)   Collection Time: 10/14/16 11:00 AM  Result Value Ref Range Status   Specimen Description  PLEURAL RIGHT  Final   Special Requests NONE  Final   Culture   Final    NO GROWTH < 24 HOURS Performed at [facility]    Report Status PENDING  Incomplete  Gram stain     Status: None   Collection Time: 10/14/16 11:00  AM  Result Value Ref Range Status   Specimen Description PLEURAL RIGHT  Final   Special Requests NONE  Final   Gram Stain   Final    WBC PRESENT,BOTH PMN AND MONONUCLEAR NO ORGANISMS SEEN CYTOSPIN SMEAR Performed at [facility]    Report Status 10/14/2016 FINAL  Final     Medications:   . apixaban  5 mg Oral BID  . aspirin EC  81 mg Oral Daily  . atorvastatin  40 mg Oral q1800  . carvedilol  3.125 mg Oral BID WC  . feeding supplement (ENSURE ENLIVE)  237 mL Oral BID BM  . furosemide  20 mg Intravenous Q6H  . ipratropium-albuterol  3 mL Nebulization TID  . levothyroxine  25 mcg Oral QAC breakfast  . lisinopril  5 mg Oral Daily  . nicotine  14 mg Transdermal Daily  . sodium chloride flush  3 mL Intravenous Q12H   Continuous Infusions:  Time spent: 15 min   LOS: 2 days   Charlynne Cousins  [facility] Pager [phone]  *Please refer to [email or web address], password [email or web address] to get updated schedule on who will round on this patient, as hospitalists switch teams weekly. If 7PM-7AM, please contact night-coverage at [email or web address], password [email or web address] for any overnight needs.  10/16/2016, 11:54 AM

## 2016-10-17 DIAGNOSIS — J449 Chronic obstructive pulmonary disease, unspecified: Secondary | ICD-10-CM

## 2016-10-17 LAB — BASIC METABOLIC PANEL
Anion gap: 9 (ref 5–15)
BUN: 16 mg/dL (ref 6–20)
CHLORIDE: 89 mmol/L — AB (ref 101–111)
CO2: 34 mmol/L — ABNORMAL HIGH (ref 22–32)
Calcium: 8.5 mg/dL — ABNORMAL LOW (ref 8.9–10.3)
Creatinine, Ser: 1.02 mg/dL — ABNORMAL HIGH (ref 0.44–1.00)
GFR calc non Af Amer: 59 mL/min — ABNORMAL LOW (ref >60)
Glucose, Bld: 92 mg/dL (ref 65–99)
POTASSIUM: 3.4 mmol/L — AB (ref 3.5–5.1)
SODIUM: 132 mmol/L — AB (ref 135–145)

## 2016-10-17 MED ORDER — POTASSIUM CHLORIDE CRYS ER 20 MEQ PO TBCR: 40.0000 meq | EXTENDED_RELEASE_TABLET | Freq: Two times a day (BID) | ORAL | Status: DC

## 2016-10-17 MED ORDER — POTASSIUM CHLORIDE CRYS ER 20 MEQ PO TBCR
20.0000 meq | EXTENDED_RELEASE_TABLET | Freq: Two times a day (BID) | ORAL | Status: DC
  Administered 2016-10-17 – 2016-10-19 (×4): 20 meq via ORAL
  Filled 2016-10-17 (×5): qty 1

## 2016-10-17 MED ORDER — IPRATROPIUM-ALBUTEROL 0.5-2.5 (3) MG/3ML IN SOLN
RESPIRATORY_TRACT | Status: AC
  Filled 2016-10-17: qty 3

## 2016-10-17 MED ORDER — IPRATROPIUM-ALBUTEROL 0.5-2.5 (3) MG/3ML IN SOLN
3.0000 mL | RESPIRATORY_TRACT | Status: DC | PRN
  Administered 2016-10-17: 3 mL via RESPIRATORY_TRACT

## 2016-10-17 NOTE — Clinical Social Work Note (Signed)
Patient uses [employer] [phone]. Transportation will need to be approved by [employer] prior to patient being discharged.   Plan: dc home with oxygen.   MSW will continue to follow patient for transportation needs.   Glendon Axe, MSW [phone] 10/17/2016 9:48 AM

## 2016-10-17 NOTE — Progress Notes (Signed)
Patient Name: Gina Frederick Date of Encounter: 10/17/2016  Primary Cardiologist: Dr [facility] Problem List     Principal Problem:   Acute on chronic respiratory failure with hypoxia [facility]) Active Problems:   Squamous cell carcinoma of head and neck (HCC)   Tobacco use   Acute on chronic combined systolic and diastolic congestive heart failure (HCC)   Essential hypertension   Chronic obstructive pulmonary disease (HCC)   Anemia in chronic kidney disease (CKD)   Acute metabolic encephalopathy   Paroxysmal atrial fibrillation (HCC)   Hyponatremia   Hypothyroidism     Subjective   She feels better, less SOB  Inpatient Medications    Scheduled Meds: . apixaban  5 mg Oral BID  . aspirin EC  81 mg Oral Daily  . atorvastatin  40 mg Oral q1800  . carvedilol  3.125 mg Oral BID WC  . feeding supplement (ENSURE ENLIVE)  237 mL Oral BID BM  . furosemide  20 mg Intravenous Q6H  . ipratropium-albuterol  3 mL Nebulization TID  . levothyroxine  25 mcg Oral QAC breakfast  . lisinopril  5 mg Oral Daily  . nicotine  14 mg Transdermal Daily  . sodium chloride flush  3 mL Intravenous Q12H   Continuous Infusions:  PRN Meds: acetaminophen **OR** acetaminophen, ipratropium-albuterol, nitroGLYCERIN, ondansetron **OR** ondansetron (ZOFRAN) IV, ondansetron, prochlorperazine   Vital Signs    Vitals:   10/16/16 1429 10/16/16 2111 10/16/16 2137 10/17/16 0611  BP: 131/69  (!) 126/98 133/79  Pulse: 77  80 75  Resp: 18  18 18   Temp: 98.2 F (36.8 C)  98.3 F (36.8 C) 98.1 F (36.7 C)  TempSrc: Oral  Oral Oral  SpO2: 97% 96% 95% 99%  Weight:    126 lb 5.2 oz (57.3 kg)  Height:        Intake/Output Summary (Last 24 hours) at 10/17/16 0756 Last data filed at 10/17/16 0600  Gross per 24 hour  Intake              660 ml  Output             3875 ml  Net            -3215 ml   Filed Weights   10/15/16 0600 10/16/16 0437 10/17/16 0611  Weight: 132 lb 11.5 oz (60.2 kg) 132  lb 1.6 oz (59.9 kg) 126 lb 5.2 oz (57.3 kg)    Physical Exam    GEN: chronically ill appearing female in no acute distress, on O2  HEENT: Grossly normal.  Neck: scar Rt neck Cardiac: RRR, no murmurs, rubs, or gallops. No clubbing, cyanosis, edema.  Radials/DP/PT 2+ and equal bilaterally.  Respiratory:  on O2, decreased breath sounds, no wheezing. Rt effusion improved on exam! Extrem: no LE edema noted MS: no deformity or atrophy. Skin: warm and dry, no rash. Neuro:  Strength and sensation are intact. Psych: AAOx3.  Normal affect.  Labs     Basic Metabolic Panel  Recent Labs  10/15/16 1150 10/16/16 0452 10/17/16 0644  NA  --  130* 132*  K  --  4.0 3.4*  CL  --  90* 89*  CO2  --  32 34*  GLUCOSE  --  102* 92  BUN  --  20 16  CREATININE  --  0.86 1.02*  CALCIUM  --  8.4* 8.5*  MG 1.9  --   --    Liver Function Tests  Recent Labs  10/14/16  0848  AST 29  ALT 24  ALKPHOS 127*  BILITOT 1.5*  PROT 5.5*  ALBUMIN 3.1*     Telemetry    NSR - Personally Reviewed  ECG    10/13/16- NSR, PVCs, LVH- Personally Reviewed  Radiology    Post thoracentesis CXR 10/14/16 IMPRESSION: Stable left base opacification compatible with small effusion with atelectasis. Improved right base opacification likely improved effusions/ atelectasis post thoracentesis. No pneumothorax.  Stable cardiomegaly.  Left-sided Port-A-Cath unchanged.   Cardiac Studies   Echo 10/14/16- Study Conclusions  - Left ventricle: The cavity size was normal. Wall thickness was   increased in a pattern of moderate LVH. The estimated ejection   fraction was in the range of 10% to 15%. Diffuse hypokinesis.   Features are consistent with a pseudonormal left ventricular   filling pattern, with concomitant abnormal relaxation and   increased filling pressure (grade 2 diastolic dysfunction).   Doppler parameters are consistent with high ventricular filling   pressure. - Aortic valve: Mildly to  moderately calcified annulus. Mildly   thickened leaflets. There was mild regurgitation. Valve area   (VTI): 1.53 cm^2. Valve area (Vmax): 2.03 cm^2. - Mitral valve: Mildly calcified annulus. Mildly thickened leaflets   . There was moderate regurgitation. The MR VC is 0.5 cm. - Left atrium: The atrium was moderately dilated. - Right ventricle: The cavity size was mildly dilated. - Right atrium: The atrium was mildly dilated. - Tricuspid valve: There was moderate-severe regurgitation. - Pulmonary arteries: Systolic pressure was moderately increased.   PA peak pressure: 64 mm Hg (S). - Technically adequate study.  Patient Profile      60 y/o female with a history of NSTEMI Aprill 2017-(treated conservativly secondary to co morbidities), PAF on Eliquis, cardiomyopathy, COPD, and a history of head and neck cancer, admitted 10/13/16 hypoxemic, hypertensive, and confused. BNP was greater than 4500.  CXR and CT demonstrated moderate bilateral pleural effusions Rt > Lt. She underwent Rt thoracentesis on 12/9 (750cc). Her last EF was 40 - 45% on echo in July 2017, (visually it appeared less per Dr [facility]).  Now her EF is reduced and found to be 15% which is clearly lower than the prior.  There is moderate MR and severe TR .   Assessment & Plan    1. Acute on chronic respiratory failure - improved  2. Acute on chronic combined CHF- BNP > 4500 on adm, s/p thoracentesis 10/14/16- 750cc. She has had brisk diuresis last 24 hrs, wgt down 6lbs, I/O negative 2.9L since adm and her Rt effusion is better on exam.  3. H/O PAF- holding NSR, CHADs VASc=4, on Eliquis   4. Cardiomyopathy-EF 10-15% by echo 10/14/16 with MR and TR, new drop in EF c/w July 2017. No plans for cath secondary to co morbidities  5. Hypothyroidism TSH 19 on adm- per primary service  6. Hx of head and neck cancer- treated with chemo and radiation Rx. Followed by Dr Whitney Muse.   7. History of NSTEMI- April 2017 with respiratory failure, PAF,  acute renal insufficiency, Troponin pk 23.06. Plan was for conservative Rx. Increased mortality from a combination of elevated troponin as well as markedly reduced ejection fraction.  8. HTN- B/P on adm- 142/96, now 116/74  Plan: Good diuresis with negative I/O and improvement in Rt effusion-currently on Lasix 20 mg IV Q6, not on Lasix prior to admission but suspect she will need to go home on Lasix. Consider changing to Lasix 40mg  IV BID for another 24 hrs  then change to Lasix 40 mg po daily. Will add K+. Check BMP and BNP in AM.  Signed, Kerin Ransom, PA-C  10/17/2016, 7:56 AM   Personally seen and examined. Agree with above. Continuing with diuresis. She has made improvement. EF 15%. Hopefully changing to by mouth Lasix daily tomorrow. Soon to be able to go home. Palliation.  Candee Furbish, MD

## 2016-10-17 NOTE — Consult Note (Signed)
Consultation Note Date: 10/17/2016   Patient Name: Gina Frederick  DOB: Jun 30, 1956  MRN: [id number]  Age / Sex: 60 y.o., female  PCP: Noel Journey. Muse, PA-C Referring Physician: Charlynne Cousins, MD  Reason for Consultation: Establishing goals of care  HPI/Patient Profile: 60 y.o. female    admitted on 10/13/2016     Clinical Assessment and Goals of Care:   Gina Frederick is an 60 y.o. 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 She was found to be in acute systolic heart failure with cardiorenal syndrome, now being diuresed, cardiology was consulted due to the sevre drop in EF from 40% to 10%. Final recommendations from cardiology are to continue coreg and lisinopril, she is also on IV Lasix.   A palliative consult has been requested for goals of care discussions.   The patient is resting in bed, there is a nurse tech in the room, trying to help the patient order her lunch. There is no family at the bedside.   I introduced myself and palliative care as follows: Palliative medicine is specialized medical care for people living with serious illness. It focuses on providing relief from the symptoms and stress of a serious illness. The goal is to improve quality of life for both the patient and the family.  The patient stated that she did not know why she was in the hospital, or for how long she has been here. 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   I discussed with patient about her serious acute and underlying conditions, such as acute  decompensated systolic heart failure, recent EF 10-15%, pleural effusions related to her heart failure, her ongoing confusion, underlying A fib etc. Goals and wishes attempted to be elicited. Detailed information given about full code versus DNR DNI. In addition, discussed about disposition options as well.   There is no established HCPOA agent, patient has a sister Burnadette Peter who is the closest next of kin. Patient has a daughter in prison, does not have any grand children, is divorced.     SUMMARY OF RECOMMENDATIONS    recommend DNR DNI, recommend SNF rehab with palliative services following on discharge. This has been discussed with patient, she does not oppose, but is confused. I have been unsuccessful in contacting both sister Burnadette Peter [phone] as well as her friend Megan Salon at [phone]. Will re attempt in am to finalize goals of care discussions.   Code Status/Advance Care Planning:  Full code for now, I have discussed DNR DNI with patient, she is how ever confused.     Symptom Management:    as above   Palliative Prophylaxis:   Bowel  Regimen   Psycho-social/Spiritual:   Desire for further Chaplaincy support:no  Additional Recommendations: Education on Hospice  Prognosis:   Guarded, ? Less than 6 months.   Discharge Planning: To Be Determined      Primary Diagnoses: Present on Admission: . Acute metabolic encephalopathy . Acute on chronic respiratory failure with hypoxia ([place]) . Paroxysmal atrial fibrillation (HCC) . Acute on chronic combined systolic and diastolic congestive heart failure ([place]) . Essential hypertension . Hyponatremia . Anemia in chronic kidney disease (CKD) . Hypothyroidism . Chronic obstructive pulmonary disease ([place]) . Squamous cell carcinoma of head and neck ([place]) . Tobacco use   I have reviewed the medical record, interviewed the patient and family, and examined the patient. The following aspects are  pertinent.  Past Medical History:  Diagnosis Date  . Arthritis   . Centrilobular emphysema ([place])   . Edentulous    Multiple dental extractions March 2017  . Essential hypertension   . Hyperlipidemia   . Ischemic cardiomyopathy    LVEF 40-45%  . NSTEMI (non-ST elevated myocardial infarction) [facility])    April 2017 - managed medically due to comorbid illnesses  . PAF (paroxysmal atrial fibrillation) ([place])   . 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 Whitney Muse   Social History   Social History  . Marital status: Divorced    Spouse name: N/A  . Number of children: 1  . Years of education: N/A   Occupational History  . Care giver    Social History Main Topics  . Smoking status: Current Some Day Smoker    Packs/day: 0.10    Years: 20.00    Types: Cigarettes  . Smokeless tobacco: Never Used     Comment: She says that she would like to have a few puffs but she has not had any.    . Alcohol use No  . Drug use: No  . Sexual activity: No   Other Topics Concern  . None   Social History Narrative   One child in prison.    Family History  Problem Relation Age of Onset  . Alzheimer's disease Mother   . Congestive Heart Failure Father 48   Scheduled Meds: . apixaban  5 mg Oral BID  . aspirin EC  81 mg Oral Daily  . atorvastatin  40 mg Oral q1800  . carvedilol  3.125 mg Oral BID WC  . feeding supplement (ENSURE ENLIVE)  237 mL Oral BID BM  . furosemide  20 mg Intravenous Q6H  . ipratropium-albuterol  3 mL Nebulization TID  . levothyroxine  25 mcg Oral QAC breakfast  . lisinopril  5 mg Oral Daily  . nicotine  14 mg Transdermal Daily  . potassium chloride  20 mEq Oral BID  . sodium chloride flush  3 mL Intravenous Q12H   Continuous Infusions: PRN Meds:.acetaminophen **OR** acetaminophen, ipratropium-albuterol, nitroGLYCERIN, ondansetron **OR** ondansetron (ZOFRAN) IV, ondansetron, prochlorperazine Medications Prior to Admission:  Prior to  Admission medications   Medication Sig Start Date End Date Taking? Authorizing Provider  acetaminophen (TYLENOL) 500 MG tablet Take 1,000 mg by mouth every 6 (six) hours as needed for mild pain or moderate pain.    Yes Historical Provider, MD  apixaban (ELIQUIS) 5 MG TABS tablet Take 1 tablet (5 mg total) by mouth 2 (two) times daily. 09/04/16  Yes Satira Sark, MD  atorvastatin (LIPITOR) 40 MG tablet Take 1 tablet (40 mg total) by mouth daily at 6 PM. 03/10/16  Yes Eugenie Filler, MD  diphenhydrAMINE (BENADRYL) 25 MG tablet Take 25 mg by mouth every 6 (six) hours as needed. Reported on 03/13/2016   Yes Historical Provider, MD  levothyroxine (SYNTHROID, LEVOTHROID) 25 MCG tablet TAKE ONE TABLET EACH MORNING BEFORE BREAKFAST 09/11/16  Yes Patrici Ranks, MD  lidocaine-prilocaine (EMLA) cream Apply a quarter size amount to port site 1 hour prior to chemo. Do not rub in. Cover with plastic wrap. 01/20/16  Yes Patrici Ranks, MD  LORazepam (ATIVAN) 0.5 MG tablet Take 1 tablet (0.5 mg total) by mouth every 4 (four) hours as needed for anxiety (one tab every 4-6 hours as needed for nausea and vomiting). 07/31/16  Yes Patrici Ranks, MD  metoprolol succinate (TOPROL XL) 25 MG 24 hr tablet Take 1 tablet (25 mg total) by mouth daily. 09/04/16  Yes Satira Sark, MD  nitroGLYCERIN (NITROSTAT) 0.4 MG SL tablet Place 1 tablet (0.4 mg total) under the tongue every 5 (five) minutes x 3 doses as needed for chest pain. 03/10/16  Yes Eugenie Filler, MD  ondansetron (ZOFRAN-ODT) 4 MG disintegrating tablet TAKE ONE TABLET BY MOUTH EVERY 8 HOURS. AS NEEDED FOR NAUSEA AND VOMITING 10/09/16  Yes Patrici Ranks, MD  POT BICARB-POT CHLORIDE,25MEQ, 25 MEQ TBEF Take 1.6 tablets (40 mEq total) by mouth daily. 05/05/16  Yes Baird Cancer, PA-C  prochlorperazine (COMPAZINE) 10 MG tablet Take 1 tablet (10 mg total) by mouth every 6 (six) hours as needed for nausea or vomiting. 09/05/16  Yes Manon Hilding Kefalas, PA-C   zolpidem (AMBIEN) 10 MG tablet Take 1 tablet (10 mg total) by mouth at bedtime as needed for sleep. 08/24/16 10/13/16 Yes Patrici Ranks, MD  benzonatate (TESSALON) 100 MG capsule Take 1 capsule (100 mg total) by mouth 3 (three) times daily as needed for cough. Patient not taking: Reported on 10/13/2016 03/10/16   Eugenie Filler, MD  feeding supplement, ENSURE ENLIVE, (ENSURE ENLIVE) LIQD Take 237 mLs by mouth 2 (two) times daily between meals. 03/10/16   Eugenie Filler, MD  prochlorperazine (COMPAZINE) 5 MG tablet Take 1 tablet (5 mg total) by mouth every 6 (six) hours as needed for nausea or vomiting. Patient not taking: Reported on 10/13/2016 09/20/16   Baird Cancer, PA-C   Allergies  Allergen Reactions  . Codeine Nausea Only   Review of Systems + for episodic chest heaviness, no dyspnea currently.   Physical Exam NAD appears chronically ill S1 S2 Clear to auscultation Abdomen soft PEG No edema, some muscle wasting evident.  Awake alert, answers few questions well, is not fully oriented.   Vital Signs: BP 133/79 (BP Location: Right Arm)   Pulse 75   Temp 98.1 F (36.7 C) (Oral)   Resp 18   Ht 5\' 4"  (1.626 m)   Wt 57.3 kg (126 lb 5.2 oz)   SpO2 99%   BMI 21.68 kg/m  Pain Assessment: No/denies pain   Pain Score: 0-No pain   SpO2: SpO2: 99 % O2 Device:SpO2: 99 % O2 Flow Rate: .O2 Flow Rate (L/min): 3 L/min  IO: Intake/output summary:  Intake/Output Summary (Last 24 hours) at 10/17/16 1417 Last data filed at 10/17/16 1300  Gross per 24 hour  Intake              660 ml  Output             4725 ml  Net            -  4065 ml    LBM: Last BM Date: 10/13/16 Baseline Weight: Weight: 59 kg (130 lb) Most recent weight: Weight: 57.3 kg (126 lb 5.2 oz)     Palliative Assessment/Data:   Flowsheet Rows   Flowsheet Row Most Recent Value  Intake Tab  Referral Department  Hospitalist  Unit at Time of Referral  Oncology Unit  Palliative Care Primary Diagnosis   Cardiac  Date Notified  10/16/16  Palliative Care Type  New Palliative care  Reason for referral  Clarify Goals of Care  Date of Admission  10/13/16  Date first seen by Palliative Care  10/17/16  # of days IP prior to Palliative referral  3  Clinical Assessment  Palliative Performance Scale Score  40%  Pain Max last 24 hours  5  Pain Min Last 24 hours  4  Dyspnea Max Last 24 Hours  3  Dyspnea Min Last 24 hours  2  Nausea Max Last 24 Hours  3  Nausea Min Last 24 Hours  2  Anxiety Max Last 24 Hours  3  Psychosocial & Spiritual Assessment  Palliative Care Outcomes  Patient/Family meeting held?  Yes  Who was at the meeting?  patient. unable to reach sister or friend.       Time In:  1320 Time Out:  1420 Time Total:  60 min  Greater than 50%  of this time was spent counseling and coordinating care related to the above assessment and plan.  Signed by: Loistine Chance, MD  [phone]  Please contact Palliative Medicine Team phone at [phone] for questions and concerns.  For individual provider: See Shea Evans

## 2016-10-17 NOTE — Progress Notes (Signed)
[facility] PROGRESS NOTE    Progress Note  Gina Frederick  [id number] DOB: 21-Apr-1956 DOA: 10/13/2016 PCP: Royce Macadamia D., PA-C     Brief Narrative:   Gina Frederick is an 60 y.o. 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 She was found to be in acute systolic heart failure with cardiorenal syndrome, now being diurese cardiology was consulted due to the sevre drop in EF from 40% to 10%. Assessment/Plan:   Acute on chronic respiratory failure with hypoxia Due to acute decompensated systolic heart failure: 2-D echo done on 12/17/2015 showed an EF of 10%. S/p thoracocentesis, Effusions likely due to heart failure. Cont. on Coreg and lisinopril. Cont IV lasix per cards. With excellent diuresis. Mild increase in Cr, now on lasix IV q 6hrs. Cards recommended conservative management, as she is not a candidate for aggressive intervention. I agree with cards due to her multiple co-morbidities and advance SSC, it would best to cont conservative management. Consult PMT.  AKI: Likely due to cardiorenal syndrome. Cr is improved.  Acute encephalopathy: CT scan of the head was done that showed no acute intracranial findings, UDS was negative and did not show any signs of infections. Sedative Medications were held she has remained afebrile with no leukocytosis. Her encephalopathy is likely due to hypoxia.  Essential hypertension Blood pressures is improved.   Normocytic anemia: Hemoglobin at baseline follow-up of PCP as an outpatient.  Paroxysmal atrial fibrillation (HCC) Rate controlled continue apixiban.    Hyponatremia Improved with diuresis. Continue IV Lasix  and monitor basic metabolic panel daily.   Hypothyroidism: Continue synthroid to recheck TSH and free T4 as an outpatient.  ? Due to non compliance.  Noncompliance of medication:  DVT prophylaxis: lovenox Family Communication:none Disposition Plan/Barrier to D/C: Hopefully in 48-72 hrs. Code Status:     Code Status Orders        Start     Ordered   10/13/16 2042  Full code  Continuous     10/13/16 2041    Code Status History    Date Active Date Inactive Code Status Order ID Comments User Context   03/07/2016  5:27 PM 03/10/2016  7:53 PM DNR [id number]  Allie Bossier, MD Inpatient   03/03/2016 10:23 PM 03/07/2016  5:27 PM Full Code [id number]  Minus Breeding, MD Inpatient        IV Access:    Peripheral IV   Procedures and diagnostic studies:   No results found.   Medical Consultants:    None.  Anti-Infectives:     Subjective:    Gina Frederick Breathing comfortable.   Objective:    Vitals:   10/16/16 2111 10/16/16 2137 10/17/16 0611 10/17/16 0943  BP:  (!) 126/98 133/79   Pulse:  80 75   Resp:  18 18   Temp:  98.3 F (36.8 C) 98.1 F (36.7 C)   TempSrc:  Oral Oral   SpO2: 96% 95% 99% 99%  Weight:   57.3 kg (126 lb 5.2 oz)   Height:        Intake/Output Summary (Last 24 hours) at 10/17/16 1236 Last data filed at 10/17/16 0955  Gross per 24 hour  Intake  660 ml  Output             4425 ml  Net            -3765 ml   Filed Weights   10/15/16 0600 10/16/16 0437 10/17/16 0611  Weight: 60.2 kg (132 lb 11.5 oz) 59.9 kg (132 lb 1.6 oz) 57.3 kg (126 lb 5.2 oz)    Exam: General exam: In no acute distress. Respiratory system: Good air movement and clear to auscultation. Cardiovascular system: S1 & S2 heard, RRR.  Gastrointestinal system: Abdomen is nondistended, soft and nontender.  Extremities: No pedal edema. Skin:  hypogmentation of the skin around the neck Psyquiatry: Judgement and insight appear normal. Mood & affect  appropriate.    Data Reviewed:    Labs: Basic Metabolic Panel:  Recent Labs Lab 10/13/16 0937 10/13/16 2105 10/14/16 0519 10/15/16 0543 10/15/16 1150 10/16/16 0452 10/17/16 0644  NA 129*  --  129* 128*  --  130* 132*  K 3.8  --  3.5 3.4*  --  4.0 3.4*  CL 85*  --  87* 87*  --  90* 89*  CO2 33*  --  33* 32  --  32 34*  GLUCOSE 96  --  108* 97  --  102* 92  BUN 29*  --  27* 23*  --  20 16  CREATININE 1.36* 1.32* 1.26* 1.13*  --  0.86 1.02*  CALCIUM 9.3  --  8.6* 8.6*  --  8.4* 8.5*  MG  --  1.9  --   --  1.9  --   --   PHOS  --  3.5  --   --   --   --   --    GFR Estimated Creatinine Clearance: 50.6 mL/min (by C-G formula based on SCr of 1.02 mg/dL (H)). Liver Function Tests:  Recent Labs Lab 10/13/16 0937 10/14/16 0519 10/14/16 0848  AST 31 29 29   ALT 24 23 24   ALKPHOS 149* 125 127*  BILITOT 1.6* 1.4* 1.5*  PROT 6.3* 5.8* 5.5*  ALBUMIN 3.6 3.3* 3.1*   No results for input(s): LIPASE, AMYLASE in the last 168 hours. No results for input(s): AMMONIA in the last 168 hours. Coagulation profile No results for input(s): INR, PROTIME in the last 168 hours.  CBC:  Recent Labs Lab 10/13/16 0937 10/13/16 2105 10/14/16 0519  WBC 7.7 5.6 6.5  HGB 11.3* 10.7* 10.4*  HCT 35.1* 32.7* 32.4*  MCV 95.6 96.2 97.0  PLT 211 200 201   Cardiac Enzymes: No results for input(s): CKTOTAL, CKMB, CKMBINDEX, TROPONINI in the last 168 hours. BNP (last 3 results) No results for input(s): PROBNP in the last 8760 hours. CBG:  Recent Labs Lab 10/13/16 1007  GLUCAP 87   D-Dimer: No results for input(s): DDIMER in the last 72 hours. Hgb A1c: No results for input(s): HGBA1C in the last 72 hours. Lipid Profile: No results for input(s): CHOL, HDL, LDLCALC, TRIG, CHOLHDL, LDLDIRECT in the last 72 hours. Thyroid function studies: No results for input(s): TSH, T4TOTAL, T3FREE, THYROIDAB in the last 72 hours.  Invalid input(s): FREET3 Anemia work up: No results for input(s):  VITAMINB12, FOLATE, FERRITIN, TIBC, IRON, RETICCTPCT in the last 72 hours. Sepsis Labs:  Recent Labs Lab 10/13/16 [id number] 10/13/16 2105 10/14/16 0519  WBC 7.7 5.6 6.5   Microbiology Recent Results (from the past 240 hour(s))  Culture, body fluid-bottle     Status: None (Preliminary result)   Collection Time: 10/14/16 11:00 AM  Result Value Ref Range Status   Specimen Description PLEURAL RIGHT  Final   Special Requests NONE  Final   Culture   Final    NO GROWTH 2 DAYS Performed at [facility]    Report Status PENDING  Incomplete  Gram stain     Status: None   Collection Time: 10/14/16 11:00 AM  Result Value Ref Range Status   Specimen Description PLEURAL RIGHT  Final   Special Requests NONE  Final   Gram Stain   Final    WBC PRESENT,BOTH PMN AND MONONUCLEAR NO ORGANISMS SEEN CYTOSPIN SMEAR Performed at [facility]    Report Status 10/14/2016 FINAL  Final     Medications:   . apixaban  5 mg Oral BID  . aspirin EC  81 mg Oral Daily  . atorvastatin  40 mg Oral q1800  . carvedilol  3.125 mg Oral BID WC  . feeding supplement (ENSURE ENLIVE)  237 mL Oral BID BM  . furosemide  20 mg Intravenous Q6H  . ipratropium-albuterol  3 mL Nebulization TID  . levothyroxine  25 mcg Oral QAC breakfast  . lisinopril  5 mg Oral Daily  . nicotine  14 mg Transdermal Daily  . potassium chloride  20 mEq Oral BID  . sodium chloride flush  3 mL Intravenous Q12H   Continuous Infusions:  Time spent: 15 min   LOS: 3 days   Charlynne Cousins  [facility] Pager [phone]  *Please refer to [email or web address], password [email or web address] to get updated schedule on who will round on this patient, as hospitalists switch teams weekly. If 7PM-7AM, please contact night-coverage at [email or web address], password [email or web address] for any overnight needs.  10/17/2016, 12:36 PM

## 2016-10-18 DIAGNOSIS — J439 Emphysema, unspecified: Secondary | ICD-10-CM

## 2016-10-18 DIAGNOSIS — C760 Malignant neoplasm of head, face and neck: Secondary | ICD-10-CM

## 2016-10-18 DIAGNOSIS — L8990 Pressure ulcer of unspecified site, unspecified stage: Secondary | ICD-10-CM | POA: Insufficient documentation

## 2016-10-18 LAB — BRAIN NATRIURETIC PEPTIDE: B Natriuretic Peptide: 3148.1 pg/mL — ABNORMAL HIGH (ref 0.0–100.0)

## 2016-10-18 LAB — BASIC METABOLIC PANEL
Anion gap: 7 (ref 5–15)
BUN: 16 mg/dL (ref 6–20)
CO2: 37 mmol/L — ABNORMAL HIGH (ref 22–32)
Calcium: 8.7 mg/dL — ABNORMAL LOW (ref 8.9–10.3)
Chloride: 87 mmol/L — ABNORMAL LOW (ref 101–111)
Creatinine, Ser: 0.84 mg/dL (ref 0.44–1.00)
GFR calc Af Amer: >60 mL/min (ref >60)
GFR calc non Af Amer: >60 mL/min (ref >60)
Glucose, Bld: 99 mg/dL (ref 65–99)
Potassium: 3.4 mmol/L — ABNORMAL LOW (ref 3.5–5.1)
Sodium: 131 mmol/L — ABNORMAL LOW (ref 135–145)

## 2016-10-18 MED ORDER — CARVEDILOL 6.25 MG PO TABS
6.2500 mg | ORAL_TABLET | Freq: Two times a day (BID) | ORAL | Status: DC
Start: 2016-10-18 — End: 2016-10-19
  Administered 2016-10-18 – 2016-10-19 (×2): 6.25 mg via ORAL
  Filled 2016-10-18 (×2): qty 1

## 2016-10-18 MED ORDER — FUROSEMIDE 40 MG PO TABS
40.0000 mg | ORAL_TABLET | Freq: Every day | ORAL | Status: DC
  Administered 2016-10-18 – 2016-10-19 (×2): 40 mg via ORAL
  Filled 2016-10-18 (×2): qty 1

## 2016-10-18 NOTE — Evaluation (Signed)
Physical Therapy Evaluation Patient Details Name: Gina Frederick MRN: [id number] DOB: 11-25-1955 Today's Date: 10/18/2016   History of Present Illness  Shakeelah Barritt is a 60 y.o. female with medical history significant of COPD on intermittent O2 of 2 Liters, HTN, HLD, Ischemic Cardiomyopathy with Combined Systolic and Diastolic CHF, PAF,Tobacco Abuse, recent Invasive SCC of Right Neck s/p XRT and Cisplatin and other comorbids who was supposed to have a Nuclear PET/CT Scan today did not get the scan and presented to [facility] with a cc of confusion  Clinical Impression  The  Patient tolerated ambulation with min assist due to decreased balance. Pt admitted with above diagnosis. Pt currently with functional limitations due to the deficits listed below (see PT Problem List).  Pt will benefit from skilled PT to increase their independence and safety with mobility to allow discharge to the venue listed below.       Follow Up Recommendations SNF;Supervision/Assistance - 24 hour    Equipment Recommendations  None recommended by PT    Recommendations for Other Services       Precautions / Restrictions        Mobility  Bed Mobility Overal bed mobility: Independent                Transfers Overall transfer level: Needs assistance Equipment used: 1 person hand held assist Transfers: Sit to/from Stand Sit to Stand: Min assist         General transfer comment: steady assist  to stand, leaning  against therapist.  Ambulation/Gait Ambulation/Gait assistance: Min assist;Mod assist Ambulation Distance (Feet): 200 Feet Assistive device: 1 person hand held assist Gait Pattern/deviations: Staggering left;Staggering right     General Gait Details: patient leaning on therapist throughout the walk. patient on   Stairs            Wheelchair Mobility    Modified Rankin (Stroke Patients Only)       Balance Overall balance assessment: Needs assistance Sitting-balance support:  Feet supported;No upper extremity supported Sitting balance-Leahy Scale: Fair     Standing balance support: During functional activity;Single extremity supported Standing balance-Leahy Scale: Poor                               Pertinent Vitals/Pain Pain Assessment: No/denies pain    Home Living Family/patient expects to be discharged to:: Skilled nursing facility Living Arrangements: Other (Comment)               Additional Comments: 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    Prior Function Level of Independence: Independent               Hand Dominance        Extremity/Trunk Assessment   Upper Extremity Assessment Upper Extremity Assessment: Generalized weakness    Lower Extremity Assessment Lower Extremity Assessment: Generalized weakness    Cervical / Trunk Assessment Cervical / Trunk Assessment: Kyphotic  Communication   Communication: No difficulties  Cognition Arousal/Alertness: Awake/alert Behavior During Therapy: WFL for tasks assessed/performed Overall Cognitive Status: Within Functional Limits for tasks assessed                      General Comments      Exercises     Assessment/Plan    PT Assessment Patient needs continued PT services  PT Problem List Decreased strength;Decreased activity tolerance;Decreased balance;Decreased mobility;Cardiopulmonary status limiting activity          PT  Treatment Interventions DME instruction;Gait training;Functional mobility training;Therapeutic activities;Patient/family education    PT Goals (Current goals can be found in the Care Plan section)  Acute Rehab PT Goals Patient Stated Goal: to get up and walk PT Goal Formulation: With patient Time For Goal Achievement: 11/01/16 Potential to Achieve Goals: Good    Frequency Min 3X/week   Barriers to discharge Decreased caregiver support;Inaccessible home environment      Co-evaluation               End of Session  Equipment Utilized During Treatment: Gait belt;Oxygen Activity Tolerance: Patient tolerated treatment well Patient left: in bed;with call bell/phone within reach;with bed alarm set Nurse Communication: Mobility status         Time: [id number] PT Time Calculation (min) (ACUTE ONLY): 22 min   Charges:   PT Evaluation $PT Eval Low Complexity: 1 Procedure     PT G CodesClaretha Cooper 10/18/2016, 5:20 PM Tresa Endo PT [phone]

## 2016-10-18 NOTE — Progress Notes (Signed)
Daily Progress Note   Patient Name: Gina Frederick       Date: 10/18/2016 DOB: [birth year]  Age: 60 y.o. MRN#: [id number] Attending Physician: Patrecia Pour, MD Primary Care Physician: Raiford Simmonds., PA-C Admit Date: 10/13/2016  Reason for Consultation/Follow-up: Establishing goals of care  Life limiting illness: acute decompensated systolic heart failure.   Subjective:  Patient is awake alert resting in bed. She is not confused, she is some what angry this morning. " I don't know who to believe anymore". She does not wish to talk about it.   Length of Stay: 4  Current Medications: Scheduled Meds:  . apixaban  5 mg Oral BID  . aspirin EC  81 mg Oral Daily  . atorvastatin  40 mg Oral q1800  . carvedilol  3.125 mg Oral BID WC  . feeding supplement (ENSURE ENLIVE)  237 mL Oral BID BM  . furosemide  20 mg Intravenous Q6H  . ipratropium-albuterol  3 mL Nebulization TID  . levothyroxine  25 mcg Oral QAC breakfast  . lisinopril  5 mg Oral Daily  . nicotine  14 mg Transdermal Daily  . potassium chloride  20 mEq Oral BID  . sodium chloride flush  3 mL Intravenous Q12H    Continuous Infusions:   PRN Meds: acetaminophen **OR** acetaminophen, ipratropium-albuterol, nitroGLYCERIN, ondansetron **OR** ondansetron (ZOFRAN) IV, ondansetron, prochlorperazine  Physical Exam         NAD No increased work of breathing Regular No edema Awake alert oriented  Vital Signs: BP 138/79 (BP Location: Right Arm)   Pulse 79   Temp 98.3 F (36.8 C) (Oral)   Resp 16   Ht 5\' 4"  (1.626 m)   Wt 53.6 kg (118 lb 2.7 oz)   SpO2 99%   BMI 20.28 kg/m  SpO2: SpO2: 99 % O2 Device: O2 Device: Nasal Cannula O2 Flow Rate: O2 Flow Rate (L/min): 4 L/min  Intake/output summary:  Intake/Output Summary  (Last 24 hours) at 10/18/16 0754 Last data filed at 10/18/16 0612  Gross per 24 hour  Intake              660 ml  Output             4250 ml  Net            -3590 ml  LBM: Last BM Date: 10/13/16 Baseline Weight: Weight: 59 kg (130 lb) Most recent weight: Weight: 53.6 kg (118 lb 2.7 oz)       Palliative Assessment/Data:    Flowsheet Rows   Flowsheet Row Most Recent Value  Intake Tab  Referral Department  Hospitalist  Unit at Time of Referral  Med/Surg Unit  Palliative Care Primary Diagnosis  Cardiac  Date Notified  10/16/16  Palliative Care Type  Return patient Palliative Care  Reason for referral  Clarify Goals of Care  Date of Admission  10/13/16  Date first seen by Palliative Care  10/17/16  # of days IP prior to Palliative referral  3  Clinical Assessment  Palliative Performance Scale Score  40%  Pain Max last 24 hours  1  Pain Min Last 24 hours  0  Dyspnea Max Last 24 Hours  3  Dyspnea Min Last 24 hours  2  Nausea Max Last 24 Hours  3  Nausea Min Last 24 Hours  2  Anxiety Max Last 24 Hours  3  Psychosocial & Spiritual Assessment  Palliative Care Outcomes  Patient/Family meeting held?  Yes  Who was at the meeting?  patient.       Patient Active Problem List   Diagnosis Date Noted  . Paroxysmal atrial fibrillation ([place]) 10/14/2016  . Hyponatremia 10/14/2016  . Hypothyroidism 10/14/2016  . Acute metabolic encephalopathy [id number]  . Insomnia 05/11/2016  . Anemia in chronic kidney disease (CKD) 03/13/2016  . Acute heart failure ([place])   . Acute on chronic combined systolic and diastolic congestive heart failure ([place])   . Pulmonary hypertension   . Atrial fibrillation with RVR ([place])   . Essential hypertension   . Acute on chronic respiratory failure with hypoxia ([place])   . Chronic obstructive pulmonary disease ([place])   . History of non-ST elevation myocardial infarction (NSTEMI) 03/03/2016  . Cancer related pain 02/07/2016  . Insomnia due to medical  condition 02/07/2016  . Absolute anemia 01/22/2016  . Port-a-cath in place 01/22/2016  . Pre-chemoradiation therapy dental protocol examination 01/07/2016  . 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 Tobacco use 12/28/2015    Palliative Care Assessment & Plan      Assessment:    Recommendations/Plan:  Will establish DNR DNI  Recommend PT consult, consider SNF rehab with palliative on discharge.   Discussed with patient that I was unable to get in touch with sister, next of kin Burnadette Peter yesterday, patient states there is no need for me to call her, she has spoken with her sister, she is agreeable to SNF on discharge.    Code Status:    Code Status Orders        Start     Ordered   10/13/16 2042  Full code  Continuous     10/13/16 2041    Code Status History    Date Active Date Inactive Code Status Order ID Comments User Context   03/07/2016  5:27 PM 03/10/2016  7:53 PM DNR [id number]  Allie Bossier, MD Inpatient   03/03/2016 10:23 PM 03/07/2016  5:27 PM Full Code [id number]  Minus Breeding, MD Inpatient       Prognosis:   < 6 months?  Discharge Planning:  [facility] for rehab with Palliative care service follow-up  Care plan was discussed with  Patient, bedside RN  Thank you for allowing the Palliative Medicine Team to assist in the care of this patient.   Time  In: 7.30 Time Out: 8 Total Time 30 Prolonged Time Billed  no       Greater than 50%  of this time was spent counseling and coordinating care related to the above assessment and plan.  Loistine Chance, MD [phone]  Please contact Palliative Medicine Team phone at [phone] for questions and concerns.

## 2016-10-18 NOTE — Progress Notes (Addendum)
Patient Name: Gina Frederick Date of Encounter: 10/18/2016  Primary Cardiologist: Dr [facility] Problem List     Principal Problem:   Acute on chronic respiratory failure with hypoxia [facility]) Active Problems:   Squamous cell carcinoma of head and neck (HCC)   Tobacco use   Acute on chronic combined systolic and diastolic congestive heart failure (HCC)   Essential hypertension   Chronic obstructive pulmonary disease (HCC)   Anemia in chronic kidney disease (CKD)   Acute metabolic encephalopathy   Paroxysmal atrial fibrillation (HCC)   Hyponatremia   Hypothyroidism   Pressure injury of skin     Subjective   Asleep   Inpatient Medications    Scheduled Meds: . apixaban  5 mg Oral BID  . atorvastatin  40 mg Oral q1800  . carvedilol  3.125 mg Oral BID WC  . feeding supplement (ENSURE ENLIVE)  237 mL Oral BID BM  . ipratropium-albuterol  3 mL Nebulization TID  . levothyroxine  25 mcg Oral QAC breakfast  . lisinopril  5 mg Oral Daily  . nicotine  14 mg Transdermal Daily  . potassium chloride  20 mEq Oral BID  . sodium chloride flush  3 mL Intravenous Q12H   Continuous Infusions:  PRN Meds: acetaminophen **OR** acetaminophen, ipratropium-albuterol, nitroGLYCERIN, ondansetron **OR** ondansetron (ZOFRAN) IV, ondansetron, prochlorperazine   Vital Signs    Vitals:   10/18/16 0511 10/18/16 0900 10/18/16 0915 10/18/16 1239  BP: 138/79 122/72  111/72  Pulse: 79 83  71  Resp: 16   18  Temp: 98.3 F (36.8 C)   98.4 F (36.9 C)  TempSrc: Oral   Oral  SpO2: 99%  98% 96%  Weight: 118 lb 2.7 oz (53.6 kg)     Height:        Intake/Output Summary (Last 24 hours) at 10/18/16 1332 Last data filed at 10/18/16 1100  Gross per 24 hour  Intake             1020 ml  Output             4300 ml  Net            -3280 ml   Filed Weights   10/16/16 0437 10/17/16 0611 10/18/16 0511  Weight: 132 lb 1.6 oz (59.9 kg) 126 lb 5.2 oz (57.3 kg) 118 lb 2.7 oz (53.6 kg)     Physical Exam    GEN: chronically ill appearing female in no acute distress, on O2  HEENT: Grossly normal.  Neck: scar Rt neck Cardiac: RRR, no murmurs, rubs, or gallops. No clubbing, cyanosis, edema.  Radials/DP/PT 2+ and equal bilaterally.  Respiratory:  on O2, decreased breath sounds, no wheezing. Rt effusion improved on exam! Extrem: no LE edema noted MS: no deformity or atrophy. Skin: warm and dry, no rash. Neuro:  Strength and sensation are intact. Psych: AAOx3.  Normal affect.  Labs     Basic Metabolic Panel  Recent Labs  10/17/16 0644 10/18/16 0515  NA 132* 131*  K 3.4* 3.4*  CL 89* 87*  CO2 34* 37*  GLUCOSE 92 99  BUN 16 16  CREATININE 1.02* 0.84  CALCIUM 8.5* 8.7*   Liver Function Tests No results for input(s): AST, ALT, ALKPHOS, BILITOT, PROT, ALBUMIN in the last 72 hours.   Telemetry    NSR - Personally Reviewed  ECG    10/13/16- NSR, PVCs, LVH- Personally Reviewed  Radiology    Post thoracentesis CXR 10/14/16 IMPRESSION: Stable left base opacification  compatible with small effusion with atelectasis. Improved right base opacification likely improved effusions/ atelectasis post thoracentesis. No pneumothorax.  Stable cardiomegaly.  Left-sided Port-A-Cath unchanged.   Cardiac Studies   Echo 10/14/16- Study Conclusions  - Left ventricle: The cavity size was normal. Wall thickness was   increased in a pattern of moderate LVH. The estimated ejection   fraction was in the range of 10% to 15%. Diffuse hypokinesis.   Features are consistent with a pseudonormal left ventricular   filling pattern, with concomitant abnormal relaxation and   increased filling pressure (grade 2 diastolic dysfunction).   Doppler parameters are consistent with high ventricular filling   pressure. - Aortic valve: Mildly to moderately calcified annulus. Mildly   thickened leaflets. There was mild regurgitation. Valve area   (VTI): 1.53 cm^2. Valve area (Vmax):  2.03 cm^2. - Mitral valve: Mildly calcified annulus. Mildly thickened leaflets   . There was moderate regurgitation. The MR VC is 0.5 cm. - Left atrium: The atrium was moderately dilated. - Right ventricle: The cavity size was mildly dilated. - Right atrium: The atrium was mildly dilated. - Tricuspid valve: There was moderate-severe regurgitation. - Pulmonary arteries: Systolic pressure was moderately increased.   PA peak pressure: 64 mm Hg (S). - Technically adequate study.  Patient Profile      60 y/o female with a history of NSTEMI Aprill 2017-(treated conservativly secondary to co morbidities), PAF on Eliquis, cardiomyopathy, COPD, and a history of head and neck cancer, admitted 10/13/16 hypoxemic, hypertensive, and confused. BNP was greater than 4500.  CXR and CT demonstrated moderate bilateral pleural effusions Rt > Lt. She underwent Rt thoracentesis on 12/9 (750cc). Her last EF was 40 - 45% on echo in July 2017, (visually it appeared less per Dr [facility]).  Now her EF is reduced and found to be 15% which is clearly lower than the prior.  There is moderate MR and severe TR .   Assessment & Plan    1. Acute on chronic respiratory failure - improved  2. Acute on chronic combined CHF- BNP > 4500 on adm now in the 3000 range, s/p thoracentesis . -6.5L out  3. H/O PAF- holding NSR, CHADs VASc=4, on Eliquis   4. Cardiomyopathy-EF 10-15% by echo 10/14/16 with MR and TR, new drop in EF c/w July 2017. No plans for cath secondary to co morbidities  5. Hypothyroidism TSH 19 on adm- per primary service  6. Hx of head and neck cancer- treated with chemo and radiation Rx. Followed by Dr Whitney Muse.   7. History of NSTEMI- April 2017 with respiratory failure, PAF, acute renal insufficiency, Troponin pk 23.06. Plan was for conservative Rx. Increased mortality from a combination of elevated troponin as well as markedly reduced ejection fraction.  8. HTN- B/P on adm- 142/96, now 116/74  Plan: Changing  lasix to 40mg  PO QD.  Will increase coreg to 6.25mg  PO BID  Appreciate palliative care assessment.   Will sign off. Please call if any questions. Would have follow up with Dr. Domenic Polite.   Signed, Candee Furbish, MD  10/18/2016, 1:32 PM

## 2016-10-18 NOTE — Progress Notes (Signed)
[facility] PROGRESS NOTE    Progress Note  Marquetta Komar  [id number] DOB: [birth year] DOA: 10/13/2016 PCP: Royce Macadamia D., PA-C    Brief Narrative:  Gina Frederick is an 60 y.o. 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 She was found to be in acute systolic heart failure with cardiorenal syndrome, now being diurese cardiology was consulted due to the sevre drop in EF from 40% to 10%.  Assessment/Plan:   Acute on chronic respiratory failure with hypoxia due to acute decompensated systolic heart failure: Improved with diuresis and thoracentesis draining 750cc transudate. 2-D echo done on 10/14/2016 showed an EF of 10%. Due to ischemic cardiomyopathy with NSTEMI in April 2017. Per cardiology, functional limitations/multiple comorbidities make her a poor candidate for aggressive intervention. - Decrease oxygen by nasal cannula back to home 2L.  - Continue coreg, increased to 6.25mg  BID, and lisinopril 5mg .  - Continue diuresis per cardiology: Transition to 40mg  po daily today.  - 136 lbs > 118lbs, down 6.5L - Palliative medicine team consulted.  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  - Follow up with Dr. Whitney Muse  AKI: Likely due to cardiorenal syndrome. - Cr has improved - Monitor in AM  Paroxysmal atrial fibrillation ([place]): Currently NSR - Continue coreg and apixiban (CHADSVASc: 4) - No evidence of hyperthyroidism.  Acute encephalopathy: CT scan of the head was done that showed no acute intracranial findings, UDS was negative and did not show any signs of infections. Sedative Medications were held she has remained afebrile with no  leukocytosis. - Improved with treatment of comorbidities, so presume this was due to hypoxemia   Essential hypertension Blood pressures is improved.   Normocytic anemia: Hemoglobin at baseline follow-up of PCP as an outpatient.    Hyponatremia Improved with diuresis. Continue IV Lasix and monitor basic metabolic panel daily.   Hypothyroidism: TSH 19.717 ?Due to non compliance. Reluctant to increase synthroid based on single value in setting of acute illness and multiple comorbidities. - Continue synthroid to recheck TSH and free T4 as an outpatient.   DVT prophylaxis: lovenox Family Communication:none Disposition Plan/Barrier to D/C: Plan to DC to SNF w/palliative care services in next 24-48 hours. Code Status:     Code Status Orders        Start     Ordered   10/13/16 2042  Full code  Continuous     10/13/16 2041    Code Status History    Date Active Date Inactive Code Status Order ID Comments User Context   03/07/2016  5:27 PM 03/10/2016  7:53 PM DNR [id number]  Allie Bossier, MD Inpatient   03/03/2016 10:23 PM 03/07/2016  5:27 PM Full Code [id number]  Minus Breeding, MD Inpatient     IV Access:    Peripheral IV  Procedures and diagnostic studies:   No results found.  Medical Consultants:    Cardiology  Palliative care  Anti-Infectives:   None  Subjective:   She reports improved dyspnea, controlled pain. No chest pain or palpitations.    Objective:    Vitals:   10/18/16 0511 10/18/16 0900 10/18/16 0915 10/18/16 1239  BP: 138/79 122/72  111/72  Pulse: 79 83  71  Resp: 16   18  Temp: 98.3 F (36.8 C)   98.4 F (36.9 C)  TempSrc: Oral   Oral  SpO2: 99%  98% 96%  Weight: 53.6 kg (118 lb 2.7 oz)     Height:        Intake/Output Summary (Last 24 hours) at 10/18/16 1409 Last data filed at 10/18/16 1100  Gross per 24 hour  Intake             1020 ml  Output             4300 ml  Net            -3280 ml   Filed Weights   10/16/16 0437 10/17/16 0611  10/18/16 0511  Weight: 59.9 kg (132 lb 1.6 oz) 57.3 kg (126 lb 5.2 oz) 53.6 kg (118 lb 2.7 oz)    Exam: General exam: chronically ill-appearing female in no acute distress. Respiratory system: Good air movement and clear to auscultation. Cardiovascular system: S1 & S2 heard, RRR. NSR on monitor. Gastrointestinal system: Abdomen is nondistended, soft and nontender.  Extremities: No pedal edema. Skin: Contracted ypogmentation of the skin around R neck Psyquiatry: Judgement and insight appear normal. Mood & affect appropriate.    Data Reviewed:    Labs: Basic Metabolic Panel:  Recent Labs Lab 10/13/16 2105 10/14/16 0519 10/15/16 0543 10/15/16 1150 10/16/16 0452 10/17/16 0644 10/18/16 0515  NA  --  129* 128*  --  130* 132* 131*  K  --  3.5 3.4*  --  4.0 3.4* 3.4*  CL  --  87* 87*  --  90* 89* 87*  CO2  --  33* 32  --  32 34* 37*  GLUCOSE  --  108* 97  --  102* 92 99  BUN  --  27* 23*  --  20 16 16   CREATININE 1.32* 1.26* 1.13*  --  0.86 1.02* 0.84  CALCIUM  --  8.6* 8.6*  --  8.4* 8.5* 8.7*  MG 1.9  --   --  1.9  --   --   --   PHOS 3.5  --   --   --   --   --   --    GFR Estimated Creatinine Clearance: 60.3 mL/min (by C-G formula based on SCr of 0.84 mg/dL). Liver Function Tests:  Recent Labs Lab 10/13/16 0937 10/14/16 0519 10/14/16 0848  AST 31 29 29   ALT 24 23 24   ALKPHOS 149* 125 127*  BILITOT 1.6* 1.4* 1.5*  PROT 6.3* 5.8* 5.5*  ALBUMIN 3.6 3.3* 3.1*   No results for input(s): LIPASE, AMYLASE in the last 168 hours. No results for input(s): AMMONIA in the last 168 hours. Coagulation profile No results for input(s): INR, PROTIME in the last 168 hours.  CBC:  Recent Labs Lab 10/13/16 0937 10/13/16 2105 10/14/16 0519  WBC 7.7 5.6 6.5  HGB 11.3* 10.7* 10.4*  HCT 35.1* 32.7* 32.4*  MCV 95.6 96.2 97.0  PLT 211 200 201   Cardiac Enzymes: No results for input(s): CKTOTAL, CKMB, CKMBINDEX, TROPONINI in the last 168 hours. BNP (last 3 results) No  results for input(s): PROBNP in the last 8760 hours. CBG:  Recent Labs Lab 10/13/16 1007  GLUCAP 87   D-Dimer: No results for input(s): DDIMER in the last 72 hours. Hgb A1c: No results for input(s): HGBA1C in the last 72 hours. Lipid Profile: No results for input(s): CHOL, HDL, LDLCALC, TRIG, CHOLHDL, LDLDIRECT in the last 72  hours. Thyroid function studies: No results for input(s): TSH, T4TOTAL, T3FREE, THYROIDAB in the last 72 hours.  Invalid input(s): FREET3 Anemia work up: No results for input(s): VITAMINB12, FOLATE, FERRITIN, TIBC, IRON, RETICCTPCT in the last 72 hours. Sepsis Labs:  Recent Labs Lab 10/13/16 [id number] 10/13/16 2105 10/14/16 0519  WBC 7.7 5.6 6.5   Microbiology Recent Results (from the past 240 hour(s))  Culture, body fluid-bottle     Status: None (Preliminary result)   Collection Time: 10/14/16 11:00 AM  Result Value Ref Range Status   Specimen Description PLEURAL RIGHT  Final   Special Requests NONE  Final   Culture   Final    NO GROWTH 3 DAYS Performed at [facility]    Report Status PENDING  Incomplete  Gram stain     Status: None   Collection Time: 10/14/16 11:00 AM  Result Value Ref Range Status   Specimen Description PLEURAL RIGHT  Final   Special Requests NONE  Final   Gram Stain   Final    WBC PRESENT,BOTH PMN AND MONONUCLEAR NO ORGANISMS SEEN CYTOSPIN SMEAR Performed at [facility]    Report Status 10/14/2016 FINAL  Final     Medications:   . apixaban  5 mg Oral BID  . atorvastatin  40 mg Oral q1800  . carvedilol  6.25 mg Oral BID WC  . feeding supplement (ENSURE ENLIVE)  237 mL Oral BID BM  . furosemide  40 mg Oral Daily  . ipratropium-albuterol  3 mL Nebulization TID  . levothyroxine  25 mcg Oral QAC breakfast  . lisinopril  5 mg Oral Daily  . nicotine  14 mg Transdermal Daily  . potassium chloride  20 mEq Oral BID  . sodium chloride flush  3 mL Intravenous Q12H   Continuous Infusions:  Time spent: 15  min   LOS: 4 days   Vance Gather  [facility] Pager [phone]  *Please refer to [email or web address], password [email or web address] to get updated schedule on who will round on this patient, as hospitalists switch teams weekly. If 7PM-7AM, please contact night-coverage at [email or web address], password [email or web address] for any overnight needs.  10/18/2016, 2:09 PM

## 2016-10-19 LAB — BASIC METABOLIC PANEL
Anion gap: 8 (ref 5–15)
BUN: 15 mg/dL (ref 6–20)
CALCIUM: 8.8 mg/dL — AB (ref 8.9–10.3)
CO2: 35 mmol/L — ABNORMAL HIGH (ref 22–32)
CREATININE: 0.89 mg/dL (ref 0.44–1.00)
Chloride: 87 mmol/L — ABNORMAL LOW (ref 101–111)
GFR calc non Af Amer: >60 mL/min (ref >60)
Glucose, Bld: 96 mg/dL (ref 65–99)
Potassium: 3.7 mmol/L (ref 3.5–5.1)
SODIUM: 130 mmol/L — AB (ref 135–145)

## 2016-10-19 LAB — CULTURE, BODY FLUID W GRAM STAIN -BOTTLE

## 2016-10-19 LAB — CULTURE, BODY FLUID-BOTTLE: CULTURE: NO GROWTH

## 2016-10-19 MED ORDER — FUROSEMIDE 40 MG PO TABS: 40.0000 mg | ORAL_TABLET | Freq: Every day | ORAL | 0 refills | Status: AC

## 2016-10-19 MED ORDER — CARVEDILOL 6.25 MG PO TABS: 6.2500 mg | ORAL_TABLET | Freq: Two times a day (BID) | ORAL | 0 refills | Status: DC

## 2016-10-19 MED ORDER — LISINOPRIL 5 MG PO TABS: 5.0000 mg | ORAL_TABLET | Freq: Every day | ORAL | 0 refills | Status: AC

## 2016-10-19 NOTE — Clinical Social Work Note (Signed)
Patient has bed at [facility] [place]. MSW and facility RN liaison met with patient at bedside and patient is agreeable to SNF placement and aware that she cannot smoke at facility.   MSW remains available as needed.   Glendon Axe, MSW [phone] 10/19/2016 11:54 AM

## 2016-10-19 NOTE — Clinical Social Work Note (Signed)
Clinical Social Work Assessment  Patient Details  Name: Gina Frederick MRN: [id number] Date of Birth: [birth year]  Date of referral:  10/19/16               Reason for consult:  Discharge Planning                Permission sought to share information with:  Case Manager, Family Supports, Customer service manager Permission granted to share information::  Yes, Verbal Permission Granted  Name::      Gina Frederick)  Agency::   (SNF's )  Relationship::   (Sister )  Sport and exercise psychologist Information:   [phone] (home) or [phone] (work))  Housing/Transportation Living arrangements for the past 2 months:  [place] of Information:   (Sibling ) Patient Interpreter Needed:  None Criminal Activity/Legal Involvement Pertinent to Current Situation/Hospitalization:  No - Comment as needed Significant Relationships:  Siblings, Friend Lives with:  Self Do you feel safe going back to the place where you live?  No Need for family participation in patient care:  Yes (Comment)  Care giving concerns:  Patient admitted from home, PT recommending SNF placement.    Social Worker assessment / plan:  MSW spoke with patient's sister, Gina Frohlich at [employer] in reference to post-acute placement. MSW introduced MSW role and SNF process. Patient's sister is agreeable to STR at SNF. MSW discussed possible options for STR and potential barriers to placement: Medicaid ONLY, patient walking 28ft. Pt's sister expressed understanding of potential barriers. No further concerns reported at this time. MSW will continue to follow patient and pt's family for continued support and to facilitate discharge needs once stable.   Employment status:  Disabled (Comment on whether or not currently receiving Disability) Insurance information:  Medicaid In [place] PT Recommendations:  [place] / Referral to community resources:  [place]  Patient/Family's Response to care:  Pt lying  in bed alert however confused. Pt's sister agreeable to SNF placement and understands potential barriers. Pt's sister pleasant and appreciated social work intervention.   Patient/Family's Understanding of and Emotional Response to Diagnosis, Current Treatment, and Prognosis: Sister knowledgeable of medical interventions and discharge planning.   Emotional Assessment Appearance:  Appears stated age Attitude/Demeanor/Rapport:  Unable to Assess, Other (Confused ) Affect (typically observed):  Unable to Assess Orientation:  Oriented to Self, Oriented to Situation, Oriented to Place Alcohol / Substance use:  Not Applicable Psych involvement (Current and /or in the community):  No (Comment)  Discharge Needs  Concerns to be addressed:  Denies Needs/Concerns at this time Readmission within the last 30 days:  No Current discharge risk:  None Barriers to Discharge:  Inadequate or no insurance   Glendon Axe A 10/19/2016, 11:23 AM

## 2016-10-19 NOTE — Progress Notes (Signed)
Called report to [facility], pt to transfer to [facility]. Ambulance has been dispatched by [facility] [place]. SRP, RN

## 2016-10-19 NOTE — Clinical Social Work Placement (Signed)
MD notified to sign DNR at RN station.  Medical Social Worker facilitated patient discharge including contacting patient family and facility to confirm patient discharge plans.  Clinical information faxed to facility and family agreeable with plan.  MSW arranged ambulance transport via [facility] to [facility].  RN to call report prior to discharge.  Medical Social Worker will sign off for now as social work intervention is no longer needed. Please consult [place] again if new need arises.    CLINICAL SOCIAL WORK PLACEMENT  NOTE  Date:  10/19/2016  Patient Details  Name: Gina Frederick MRN: [id number] Date of Birth: 09/05/1956  Clinical Social Work is seeking post-discharge placement for this patient at the [place] level of care (*CSW will initial, date and re-position this form in  chart as items are completed):  Yes   Patient/family provided with [facility] Work Department's list of facilities offering this level of care within the geographic area requested by the patient (or if unable, by the patient's family).  Yes   Patient/family informed of their freedom to choose among providers that offer the needed level of care, that participate in Medicare, Medicaid or managed care program needed by the patient, have an available bed and are willing to accept the patient.  Yes   Patient/family informed of [facility]'s ownership interest in [facility] and [facility], as well as of the fact that they are under no obligation to receive care at these facilities.  PASRR submitted to EDS on 10/19/16     PASRR number received on 10/19/16     Existing PASRR number confirmed on       FL2 transmitted to all facilities in geographic area requested by pt/family on 10/19/16     FL2 transmitted to all facilities within larger geographic area on       Patient informed that his/her managed care company has contracts with or will negotiate with certain  facilities, including the following:        Yes   Patient/family informed of bed offers received.  Patient chooses bed at  ([facility] )     Physician recommends and patient chooses bed at      Patient to be transferred to  [facility] ) on 10/19/16.  Patient to be transferred to facility by  [facility] )     Patient family notified on 10/19/16 of transfer.  Name of family member notified:   (Pt's sister, Lelon Frohlich )     PHYSICIAN Please sign FL2, Please prepare priority discharge summary, including medications, Please sign DNR     Additional Comment:    _______________________________________________ Glendon Axe A 10/19/2016, 1:39 PM

## 2016-10-19 NOTE — Discharge Summary (Addendum)
Physician Discharge Summary  Gina Frederick [id number] DOB: Aug 28, 1956 DOA: 10/13/2016  PCP: Royce Macadamia D., PA-C  Admit date: 10/13/2016 Discharge date: 10/19/2016  Admitted From: Home Disposition: SNF   Recommendations for Outpatient Follow-up:  1. Follow up with PCP 2. Please obtain BMP next week to monitor renal function and electrolytes.  3. Monitor BP and HR, coreg replaced metoprolol, and lisinopril was started 4. Recheck TSH, free T4 in 6-8 weeks  Home Health: Per SNF Equipment/Devices: 2L O2  Discharge Condition: Stable for transfer, guarded prognosis CODE STATUS: DNR Diet recommendation: Heart healthy  Brief/Interim Summary: Gina Frederick is an 60 y.o. 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 She was found to be in acute systolic heart failure with worsening of EF to 10% with cardiorenal syndrome. Cardiology was consulted and guided diuresis. Her weight dropped 136 lbs > 116 lbs with IV diuresis, and this was eventually transitioned to lasix 40mg  po daily. Her respiratory function has returned to baseline.   Discharge Diagnoses:  Principal Problem:   Acute on chronic respiratory failure with hypoxia (HCC) Active Problems:   Squamous cell carcinoma of head and neck (HCC)   Tobacco use   Acute on chronic combined systolic and diastolic congestive heart failure (HCC)   Essential hypertension   Chronic obstructive pulmonary disease (HCC)   Anemia in chronic kidney disease (CKD)   Acute metabolic encephalopathy   Paroxysmal atrial fibrillation (HCC)   Hyponatremia   Hypothyroidism   Pressure injury of skin   Cancer of neck (HCC)  Acute on chronic  respiratory failure with hypoxia due to acute decompensated systolic heart failure: Improved with diuresis and thoracentesis draining 750cc transudate. 2-D echo done on 10/14/2016 showed an EF of 10%. Due to ischemic cardiomyopathy with NSTEMI in April 2017. Per cardiology, functional limitations/multiple comorbidities make her a poor candidate for aggressive intervention. - Decreased oxygen by nasal cannula back to home 2L.  - Continue coreg, increased to 6.25mg  BID, and lisinopril 5mg .  - Continue diuresis per cardiology: Transition to 40mg  po daily 12/13 - 136 lbs > 116lbs, down 6.5L - Palliative medicine team consulted: Will receive palliative services at SNF.  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  - Follow up with Dr. Whitney Muse  AKI: Likely due to cardiorenal syndrome. - Cr has improved, stable.  Paroxysmal atrial fibrillation ([place]): Currently NSR - Continue coreg and apixiban (CHADSVASc: 4) - No evidence of hyperthyroidism.  Acute encephalopathy: CT scan of the head was done that showed no acute intracranial findings, UDS was negative and did not show any signs of infections. Sedative Medications were held she has remained afebrile with no leukocytosis. - Improved with treatment of comorbidities, so presume this was due to hypoxemia   Essential hypertension Blood pressures is improved.   Normocytic anemia: Hemoglobin at baseline follow-up of PCP as an outpatient.    Hyponatremia Improved with diuresis. Continue IV Lasix and monitor basic metabolic panel daily.   Hypothyroidism: TSH 19.717 ?Due to non compliance. Reluctant to increase synthroid based on single value in setting of acute illness and multiple comorbidities. - Continue synthroid to recheck TSH and free T4 as an outpatient.   Stage II sacral pressure ulcer: Present on arrival  and not infected per report.  Discharge Instructions Discharge Instructions    (HEART FAILURE PATIENTS) Call MD:   Anytime you have any of the following symptoms: 1) 3 pound weight gain in 24 hours or 5 pounds in 1 week 2) shortness of breath, with or without a dry hacking cough 3) swelling in the hands, feet or stomach 4) if you have to sleep on extra pillows at night in order to breathe.    Complete by:  As directed    Diet - low sodium heart healthy    Complete by:  As directed        Medication List    STOP taking these medications   metoprolol succinate 25 MG 24 hr tablet Commonly known as:  TOPROL XL   zolpidem 10 MG tablet Commonly known as:  AMBIEN     TAKE these medications   acetaminophen 500 MG tablet Commonly known as:  TYLENOL Take 1,000 mg by mouth every 6 (six) hours as needed for mild pain or moderate pain.   apixaban 5 MG Tabs tablet Commonly known as:  ELIQUIS Take 1 tablet (5 mg total) by mouth 2 (two) times daily.   atorvastatin 40 MG tablet Commonly known as:  LIPITOR Take 1 tablet (40 mg total) by mouth daily at 6 PM.   benzonatate 100 MG capsule Commonly known as:  TESSALON Take 1 capsule (100 mg total) by mouth 3 (three) times daily as needed for cough.   carvedilol 6.25 MG tablet Commonly known as:  COREG Take 1 tablet (6.25 mg total) by mouth 2 (two) times daily with a meal.   diphenhydrAMINE 25 MG tablet Commonly known as:  BENADRYL Take 25 mg by mouth every 6 (six) hours as needed. Reported on 03/13/2016   feeding supplement (ENSURE ENLIVE) Liqd Take 237 mLs by mouth 2 (two) times daily between meals.   furosemide 40 MG tablet Commonly known as:  LASIX Take 1 tablet (40 mg total) by mouth daily. Start taking on:  10/20/2016   levothyroxine 25 MCG tablet Commonly known as:  SYNTHROID, LEVOTHROID TAKE ONE TABLET EACH MORNING BEFORE BREAKFAST   lidocaine-prilocaine cream Commonly known as:  EMLA Apply a quarter size amount to port site 1 hour prior to chemo. Do not rub in. Cover with plastic wrap.   lisinopril 5 MG tablet Commonly known as:   PRINIVIL,ZESTRIL Take 1 tablet (5 mg total) by mouth daily. Start taking on:  10/20/2016   LORazepam 0.5 MG tablet Commonly known as:  ATIVAN Take 1 tablet (0.5 mg total) by mouth every 4 (four) hours as needed for anxiety (one tab every 4-6 hours as needed for nausea and vomiting).   nitroGLYCERIN 0.4 MG SL tablet Commonly known as:  NITROSTAT Place 1 tablet (0.4 mg total) under the tongue every 5 (five) minutes x 3 doses as needed for chest pain.   ondansetron 4 MG disintegrating tablet Commonly known as:  ZOFRAN-ODT TAKE ONE TABLET BY MOUTH EVERY 8 HOURS. AS NEEDED FOR NAUSEA AND VOMITING   POT BICARB-POT CHLORIDE(25MEQ) 25 MEQ Tbef Take 1.6 tablets (40 mEq total) by mouth daily.   prochlorperazine 10 MG tablet Commonly known as:  COMPAZINE Take 1 tablet (10 mg total) by mouth every 6 (six) hours as needed for nausea or vomiting. What changed:  Another medication with the same name was removed. Continue taking this medication, and follow the directions you see here.            Durable Medical Equipment  Start     Ordered   10/13/16 1805  For home use only DME oxygen  Once    Question Answer Comment  Mode or (Route) Nasal cannula   Liters per Minute 2   Frequency Continuous (stationary and portable oxygen unit needed)   Oxygen conserving device Yes   Oxygen delivery system Gas      10/13/16 1823      Allergies  Allergen Reactions  . Codeine Nausea Only    Consultations:  Cardiology, Dr. Marlou Porch  Procedures/Studies: Dg Chest 1 View  Result Date: 10/14/2016 CLINICAL DATA:  Post thoracentesis. EXAM: CHEST 1 VIEW COMPARISON:  10/13/2016 and chest CT 10/13/2016 FINDINGS: Left-sided Port-A-Cath unchanged. Lungs are adequately inflated with persistent left base opacification likely small effusion with atelectasis without significant change. Improved right base opacification likely improved effusion/atelectasis post thoracentesis. No evidence of pneumothorax.  Mild stable cardiomegaly. Remainder of the exam is unchanged. IMPRESSION: Stable left base opacification compatible with small effusion with atelectasis. Improved right base opacification likely improved effusions/ atelectasis post thoracentesis. No pneumothorax. Stable cardiomegaly. Left-sided Port-A-Cath unchanged. Electronically Signed   By: Marin Olp M.D.   On: 10/14/2016 11:13   Dg Chest 2 View  Result Date: 10/13/2016 CLINICAL DATA:  Cough and confusion with UTI. EXAM: CHEST  2 VIEW COMPARISON:  03/03/2016 FINDINGS: Left-sided Port-A-Cath has tip just below the cavoatrial junction. Lungs are adequately inflated demonstrate worsening bibasilar opacification right greater than left likely small effusions with associated atelectasis versus infection. Mild stable cardiomegaly. Remainder of the exam is unchanged. IMPRESSION: Bibasilar opacification compatible with bilateral effusions with associated atelectasis versus infection. Electronically Signed   By: Marin Olp M.D.   On: 10/13/2016 18:45   Ct Head Wo Contrast  Result Date: 10/13/2016 CLINICAL DATA:  Per [name] pt brought from nuclear medicine for possible overdose related to confusion onset of unknown time. Pt able to verbalize self, place, situation, but time off by more than two calender days. EXAM: CT HEAD WITHOUT CONTRAST TECHNIQUE: Contiguous axial images were obtained from the base of the skull through the vertex without intravenous contrast. COMPARISON:  06/26/2016 PET-CT, 12/17/2015 neck CT FINDINGS: Brain: There is significant central and cortical atrophy. Mild periventricular white matter changes are consistent with small vessel disease. There is no intra or extra-axial fluid collection or mass lesion. The basilar cisterns and ventricles have a normal appearance. There is no CT evidence for acute infarction or hemorrhage. Vascular: There is mild atherosclerotic calcification of the carotid siphons. Skull: Normal. Negative for  fracture or focal lesion. Sinuses/Orbits: There is mucoperiosteal thickening of the maxillary sinuses bilaterally. Bilateral mastoid effusions. Other: None IMPRESSION: 1. Atrophy and small vessel disease. 2.  No evidence for acute intracranial abnormality. 3. New bilateral mastoid effusions. 4. Paranasal sinus disease. Electronically Signed   By: Nolon Nations M.D.   On: 10/13/2016 11:37   Ct Soft Tissue Neck W Contrast  Result Date: 10/13/2016 CLINICAL DATA:  60 y/o F; history of squamous cell carcinoma of the neck status post radiation and chemotherapy. Patient currently with altered mental status. For follow-up. EXAM: CT NECK WITH CONTRAST TECHNIQUE: Multidetector CT imaging of the neck was performed using the standard protocol following the bolus administration of intravenous contrast. CONTRAST:  80 cc Isovue 370. COMPARISON:  12/17/2015 CT of the neck and 06/26/2016 PET-CT. FINDINGS: Ill-defined predominantly hypoattenuating/hypoenhancing mass centered in the right anterolateral neck soft tissues spanning approximately 54 x 34 x 65 mm (AP x ML x CC). There are multiple irregular  foci of nodular enhancement throughout the mass and several satellite foci extending into the right anterior subcutaneous fat. The mass infiltrates wall deep cervical compartments of the right neck including the right submandibular space, right inferior masticator space, right parotid gland, and right carotid sheath. The margins of the mass are poorly defined as a contiguous within fat stranding present throughout the neck. In comparison with the prior CT of the neck the mass has decreased in size and there is less enhancement. Pharynx and larynx: Increasing bulky mucosal thickening in the hypopharynx predominantly right posteriorly and subjacent to the hyoid bone (series 13, image 49). This mucosal thickening is contiguous with the ill-defined mass in the right anterior neck and may represent tumor invasion. There is narrowing  of the airway with a cross-section of 6 x 14 mm. Infiltration of paraglottic fat may represent posttreatment changes, tumor, or edema. Subglottic airway is patent. Salivary glands: Tumor infiltration of the right parotid gland inferiorly both deep and superficial compartments. Complete tumor infiltration of right submandibular gland. Other salivary glands are stable. Thyroid: Subcentimeter left thyroid nodule. Lymph nodes: Enhancing right submandibular and bilateral submental lymph nodes may be metastatic. The right-sided cervical mass spans the entire cervical chain. No left-sided cervical adenopathy. Vascular: Occluded right internal jugular vein. Tumor infiltration of the right carotid sheath without vascular occlusion. Greater than 180 degrees contact upon the upper right common carotid (series 13, image 43). Limited intracranial: Negative. Visualized orbits: Negative. Mastoids and visualized paranasal sinuses: Moderate mucosal thickening of bilateral maxillary sinuses and opacification of bilateral mastoid air cells. Skeleton: No acute osseous abnormality identified. Cervical degenerative changes greatest at the C4 through C6 levels appear Upper chest: Please refer to concurrent CT angiogram of the chest. Other: None. IMPRESSION: 1. Ill-defined mass centered in the right anterior neck with infiltration of the right submandibular space and gland, right inferior masticator space, right parotid gland, and right carotid space, overall decreased in size and enhancement in comparison with the prior CT of the neck. Residual enhancing tumor is present. 2. Bilateral submental and right submandibular enhancing lymph nodes are probably metastatic. 3. Increase mucosal thickening throughout the right side greater than left side of the hypopharynx directly contiguous with the right neck mass probably representing a combination of tumor invasion and posttreatment changes. Narrowing of the airway to a 6 x 14 mm cross-section.  4. Diffuse stranding of the subcutaneous fat throughout the neck probably represents anasarca. Electronically Signed   By: Kristine Garbe M.D.   On: 10/13/2016 19:22   Ct Angio Chest Pe W And/or Wo Contrast  Result Date: 10/13/2016 CLINICAL DATA:  Patient brought from nuclear Medicine for possible overdose related to confusion of unknown onset. EXAM: CT ANGIOGRAPHY CHEST WITH CONTRAST TECHNIQUE: Multidetector CT imaging of the chest was performed using the standard protocol during bolus administration of intravenous contrast. Multiplanar CT image reconstructions and MIPs were obtained to evaluate the vascular anatomy. CONTRAST:  80 mL Isovue 370 IV. COMPARISON:  Chest CT 01/03/2016 and PET-CT 06/26/2016 FINDINGS: Left subclavian Port-A-Cath with tip in the SVC. Cardiovascular: Cardiomegaly slightly worse. There is a gradual tapering off of opacification of the distal right lower lobar pulmonary arteries with similar but less pronounced appearance in the distal left lower lobar arteries likely not representing distal emboli. Minimal calcified plaque over the thoracic aorta. The Remaining vascular structures are unremarkable. Mediastinum/Nodes: No significant hilar or mediastinal adenopathy. Remaining mediastinal structures are within normal. Evidence of known right lower neck mass unchanged. Lungs/Pleura: Lungs are  adequately inflated demonstrate moderate size bilateral pleural effusions right greater than left with mild associated basilar opacification likely atelectasis although cannot exclude infection. Stable 3 mm nodule over the right lower lobe. No new nodules. Apical pleural thickening. Airways are within normal. Upper Abdomen: Within normal. Musculoskeletal: Minimal degenerate change of the spine. Review of the MIP images confirms the above findings. IMPRESSION: Moderate size bilateral pleural effusions right greater than left with associated bibasilar opacification likely atelectasis, although  cannot exclude infection. No definite pulmonary emboli. Worsening cardiomegaly. Recommend clinical correlation for cardiomyopathy. Residual density over known right neck base mass unchanged. Stable 3 mm nodule right lower lobe. Electronically Signed   By: Marin Olp M.D.   On: 10/13/2016 19:20   US Thoracentesis Asp Pleural Space W/img Guide  Result Date: 10/14/2016 INDICATION: Shortness of breath. Pleural effusions. Request diagnostic and therapeutic thoracentesis. EXAM: ULTRASOUND GUIDED RIGHT THORACENTESIS MEDICATIONS: None. COMPLICATIONS: None immediate. Postprocedural chest x-ray negative for pneumothorax. PROCEDURE: An ultrasound guided thoracentesis was thoroughly discussed with the patient and questions answered. The benefits, risks, alternatives and complications were also discussed. The patient understands and wishes to proceed with the procedure. Written consent was obtained. Ultrasound was performed to localize and mark an adequate pocket of fluid in the right chest. The area was then prepped and draped in the normal sterile fashion. 1% Lidocaine was used for local anesthesia. Under ultrasound guidance a Safe-T-Centesis catheter was introduced. Thoracentesis was performed. The catheter was removed and a dressing applied. FINDINGS: A total of approximately 750 mL of clear yellow fluid was removed. Samples were sent to the laboratory as requested by the clinical team. IMPRESSION: Successful ultrasound guided right thoracentesis yielding 750 mL of pleural fluid. Read by: Ascencion Dike PA-C Electronically Signed   By: Sandi Mariscal M.D.   On: 10/14/2016 11:10    Thoracentesis yielding ~750cc transudate.  Subjective: Pt reports breathing is better than previously, back to baseline but still extremely dyspneic with exertion. No chest pain, palpitations. Legs are not swollen anymore.   Discharge Exam: Vitals:   10/19/16 0344 10/19/16 0906  BP: (!) 142/88   Pulse: 72 62  Resp: 14 17  Temp:  97.9 F (36.6 C)    Vitals:   10/18/16 1936 10/18/16 2226 10/19/16 0344 10/19/16 0906  BP:  108/66 (!) 142/88   Pulse:  65 72 62  Resp:  15 14 17   Temp:  98.2 F (36.8 C) 97.9 F (36.6 C)   TempSrc:  Oral Oral   SpO2: 93% 97% 98% 98%  Weight:   52.7 kg (116 lb 2.9 oz)   Height:       General: Elderly, tired-appearing female not in acute distress Cardiovascular: RRR, S1/S2 +, no rubs, no gallops Respiratory: Nonlabored on 2L O2 by nasal cannula at rest, very dyspneic when sitting up in bed under own power. Diminished global breath sounds without wheezing. Abdominal: Soft, NT, ND, bowel sounds + Extremities: no edema, no cyanosis Skin: Contracted hypogimentation of the skin around R neck. Small puncture wound on right back at lung base c/d/i.  The results of significant diagnostics from this hospitalization (including imaging, microbiology, ancillary and laboratory) are listed below for reference.     Labs: BNP (last 3 results)  Recent Labs  03/08/16 0530 10/13/16 1936 10/18/16 0515  BNP >4,500.0* >4,500.0* [id number]*   Basic Metabolic Panel:  Recent Labs Lab 10/13/16 2105  10/15/16 0543 10/15/16 1150 10/16/16 0452 10/17/16 0644 10/18/16 0515 10/19/16 0503  NA  --   < >  128*  --  130* 132* 131* 130*  K  --   < > 3.4*  --  4.0 3.4* 3.4* 3.7  CL  --   < > 87*  --  90* 89* 87* 87*  CO2  --   < > 32  --  32 34* 37* 35*  GLUCOSE  --   < > 97  --  102* 92 99 96  BUN  --   < > 23*  --  20 16 16 15   CREATININE 1.32*  < > 1.13*  --  0.86 1.02* 0.84 0.89  CALCIUM  --   < > 8.6*  --  8.4* 8.5* 8.7* 8.8*  MG 1.9  --   --  1.9  --   --   --   --   PHOS 3.5  --   --   --   --   --   --   --   < > = values in this interval not displayed. Liver Function Tests:  Recent Labs Lab 10/13/16 0937 10/14/16 0519 10/14/16 0848  AST 31 29 29   ALT 24 23 24   ALKPHOS 149* 125 127*  BILITOT 1.6* 1.4* 1.5*  PROT 6.3* 5.8* 5.5*  ALBUMIN 3.6 3.3* 3.1*   CBC:  Recent Labs Lab  10/13/16 0937 10/13/16 2105 10/14/16 0519  WBC 7.7 5.6 6.5  HGB 11.3* 10.7* 10.4*  HCT 35.1* 32.7* 32.4*  MCV 95.6 96.2 97.0  PLT 211 200 201   CBG:  Recent Labs Lab 10/13/16 1007  GLUCAP 87   Urinalysis    Component Value Date/Time   COLORURINE YELLOW 10/13/2016 2040   APPEARANCEUR CLEAR 10/13/2016 2040   LABSPEC 1.010 10/13/2016 2040   PHURINE 7.0 10/13/2016 2040   GLUCOSEU NEGATIVE 10/13/2016 2040   HGBUR NEGATIVE 10/13/2016 2040   BILIRUBINUR NEGATIVE 10/13/2016 2040   KETONESUR NEGATIVE 10/13/2016 2040   PROTEINUR NEGATIVE 10/13/2016 2040   NITRITE NEGATIVE 10/13/2016 2040   LEUKOCYTESUR TRACE (A) 10/13/2016 2040   Sepsis Labs Invalid input(s): PROCALCITONIN,  WBC,  LACTICIDVEN Microbiology Recent Results (from the past 240 hour(s))  Culture, body fluid-bottle     Status: None (Preliminary result)   Collection Time: 10/14/16 11:00 AM  Result Value Ref Range Status   Specimen Description PLEURAL RIGHT  Final   Special Requests NONE  Final   Culture   Final    NO GROWTH 4 DAYS Performed at [facility]    Report Status PENDING  Incomplete  Gram stain     Status: None   Collection Time: 10/14/16 11:00 AM  Result Value Ref Range Status   Specimen Description PLEURAL RIGHT  Final   Special Requests NONE  Final   Gram Stain   Final    WBC PRESENT,BOTH PMN AND MONONUCLEAR NO ORGANISMS SEEN CYTOSPIN SMEAR Performed at [facility]    Report Status 10/14/2016 FINAL  Final    Time coordinating discharge: Over 71 minutes  Vance Gather, MD  [facility] 10/19/2016, 1:07 PM Pager [phone]  If 7PM-7AM, please contact night-coverage [email or web address] Password [email or web address]

## 2016-10-19 NOTE — NC FL2 (Signed)
[place] LEVEL OF CARE SCREENING TOOL     IDENTIFICATION  Patient Name: Gina Frederick Birthdate: 09/11/1956 Sex: female Admission Date (Current Location): 10/13/2016  [facility] and [place] Number:  Herbalist and Address:  [facility],  [place] [address], [employer]      Provider Number: [id number]  Attending Physician Name and Address:  Patrecia Pour, MD  Relative Name and Phone Number:       Current Level of Care: Hospital Recommended Level of Care: [place] Prior Approval Number:    Date Approved/Denied:   PASRR Number:  ([id number])  Discharge Plan: SNF    Current Diagnoses: Patient Active Problem List   Diagnosis Date Noted  . Pressure injury of skin 10/18/2016  . Cancer of neck ([place])   . Paroxysmal atrial fibrillation ([place]) 10/14/2016  . Hyponatremia 10/14/2016  . Hypothyroidism 10/14/2016  . Acute metabolic encephalopathy [id number]  . Insomnia 05/11/2016  . Anemia in chronic kidney disease (CKD) 03/13/2016  . Acute heart failure ([place])   . Acute on chronic combined systolic and diastolic congestive heart failure ([place])   . Pulmonary hypertension   . Atrial fibrillation with RVR ([place])   . Essential hypertension   . Acute on chronic respiratory failure with hypoxia ([place])   . Chronic obstructive pulmonary disease ([place])   . History of non-ST elevation myocardial infarction (NSTEMI) 03/03/2016  . Cancer related pain 02/07/2016  . Insomnia due to medical condition 02/07/2016  . Absolute anemia 01/22/2016  . Port-a-cath in place 01/22/2016  . Pre-chemoradiation therapy dental protocol examination 01/07/2016  . 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 Tobacco use 12/28/2015    Orientation RESPIRATION BLADDER Height & Weight     Self, Situation, Place  O2 Continent Weight: 116 lb 2.9 oz (52.7 kg) Height:  5\' 4"  (162.6 cm)  BEHAVIORAL SYMPTOMS/MOOD NEUROLOGICAL BOWEL NUTRITION STATUS    (none )  (none )   Diet (Heart Healthy )  AMBULATORY STATUS COMMUNICATION OF NEEDS Skin   Limited Assist Verbally PU Stage and Appropriate Care   PU Stage 2 Dressing:  (PRN)                   Personal Care Assistance Level of Assistance  Bathing, Dressing, Feeding Bathing Assistance: Limited assistance Feeding assistance: Independent Dressing Assistance: Limited assistance     Functional Limitations Info  Speech, Hearing, Sight Sight Info: Adequate Hearing Info: Adequate Speech Info: Adequate    SPECIAL CARE FACTORS FREQUENCY  PT (By licensed PT)     PT Frequency: 3              Contractures      Additional Factors Info  Code Status, Allergies Code Status Info: DNR CODE  Allergies Info: N/A           Current Medications (10/19/2016):  This is the current hospital active medication list Current Facility-Administered Medications  Medication Dose Route Frequency Provider Last Rate Last Dose  . acetaminophen (TYLENOL) tablet 650 mg  650 mg Oral Q6H PRN Bertram Savin Sheikh, DO   650 mg at 10/18/16 1219   Or  . acetaminophen (TYLENOL) suppository 650 mg  650 mg Rectal Q6H PRN Kerney Elbe, DO      . apixaban (ELIQUIS) tablet 5 mg  5 mg Oral BID Bertram Savin Sheikh, DO   5 mg at 10/19/16 0846  . atorvastatin (LIPITOR) tablet 40 mg  40 mg Oral q1800  [place], DO   40 mg at 10/18/16 1724  . carvedilol (COREG) tablet 6.25 mg  6.25 mg Oral BID WC Jerline Pain, MD   6.25 mg at 10/19/16 0848  . feeding supplement (ENSURE ENLIVE) (ENSURE ENLIVE) liquid 237 mL  237 mL Oral BID BM Merwin, DO   237 mL at 10/19/16 0845  . furosemide (LASIX) tablet 40 mg  40 mg Oral Daily Jerline Pain, MD   40 mg at 10/19/16 0847  . ipratropium-albuterol (DUONEB) 0.5-2.5 (3) MG/3ML nebulizer solution 3 mL  3 mL Nebulization TID Bertram Savin Sheikh, DO   3 mL at 10/19/16 0906  . ipratropium-albuterol (DUONEB) 0.5-2.5 (3) MG/3ML nebulizer solution 3 mL  3 mL  Nebulization Q4H PRN Charlynne Cousins, MD   3 mL at 10/17/16 0525  . levothyroxine (SYNTHROID, LEVOTHROID) tablet 25 mcg  25 mcg Oral QAC breakfast [place], DO   25 mcg at 10/19/16 0849  . lisinopril (PRINIVIL,ZESTRIL) tablet 5 mg  5 mg Oral Daily Charlynne Cousins, MD   5 mg at 10/19/16 0847  . nicotine (NICODERM CQ - dosed in mg/24 hours) patch 14 mg  14 mg Transdermal Daily [facility], DO   14 mg at 10/19/16 0848  . nitroGLYCERIN (NITROSTAT) SL tablet 0.4 mg  0.4 mg Sublingual Q5 Min x 3 PRN Omair Latif Sheikh, DO      . ondansetron [facility]) tablet 4 mg  4 mg Oral Q6H PRN Bertram Savin Sheikh, DO       Or  . ondansetron (ZOFRAN) injection 4 mg  4 mg Intravenous Q6H PRN Omair Latif Sheikh, DO      . ondansetron (ZOFRAN-ODT) disintegrating tablet 4 mg  4 mg Oral Q8H PRN Omair Latif Sheikh, DO      . potassium chloride SA (K-DUR,KLOR-CON) CR tablet 20 mEq  20 mEq Oral BID Erlene Quan, PA-C   20 mEq at 10/19/16 0847  . prochlorperazine (COMPAZINE) tablet 10 mg  10 mg Oral Q6H PRN Bertram Savin Sheikh, DO      . sodium chloride flush (NS) 0.9 % injection 3 mL  3 mL Intravenous Q12H Omair Latif Sheikh, DO   3 mL at 10/19/16 0848   Facility-Administered Medications Ordered in Other Encounters  Medication Dose Route Frequency Provider Last Rate Last Dose  . sodium polystyrene (KAYEXALATE) 15 GM/60ML suspension 30 g  30 g Oral Once Baird Cancer, PA-C         Discharge Medications: Please see discharge summary for a list of discharge medications.  Relevant Imaging Results:  Relevant Lab Results:   Additional Information SSN [id number]  Glendon Axe A

## 2016-10-19 NOTE — Clinical Social Work Placement (Signed)
   CLINICAL SOCIAL WORK PLACEMENT  NOTE  Date:  10/19/2016  Patient Details  Name: Gina Frederick MRN: [id number] Date of Birth: 05-Jun-1956  Clinical Social Work is seeking post-discharge placement for this patient at the [place] level of care (*CSW will initial, date and re-position this form in  chart as items are completed):  Yes   Patient/family provided with [facility] Work Department's list of facilities offering this level of care within the geographic area requested by the patient (or if unable, by the patient's family).  Yes   Patient/family informed of their freedom to choose among providers that offer the needed level of care, that participate in Medicare, Medicaid or managed care program needed by the patient, have an available bed and are willing to accept the patient.  Yes   Patient/family informed of [facility]'s ownership interest in [facility] and [facility], as well as of the fact that they are under no obligation to receive care at these facilities.  PASRR submitted to EDS on 10/19/16     PASRR number received on 10/19/16     Existing PASRR number confirmed on       FL2 transmitted to all facilities in geographic area requested by pt/family on 10/19/16     FL2 transmitted to all facilities within larger geographic area on       Patient informed that his/her managed care company has contracts with or will negotiate with certain facilities, including the following:            Patient/family informed of bed offers received.  Patient chooses bed at       Physician recommends and patient chooses bed at      Patient to be transferred to   on  .  Patient to be transferred to facility by       Patient family notified on   of transfer.  Name of family member notified:        PHYSICIAN Please sign FL2, Please prepare priority discharge summary, including medications, Please sign DNR     Additional Comment:     _______________________________________________ Glendon Axe A 10/19/2016, 10:54 AM

## 2016-10-19 NOTE — Progress Notes (Signed)
Attempted to see pt. Pt being d/c'd to SNF in minutes.  Ambulance has been called. Will defer OT eval to SNF. Jinger Neighbors, [place] [id number]

## 2016-12-04 ENCOUNTER — Encounter (HOSPITAL_COMMUNITY): Payer: Self-pay | Admitting: Adult Health

## 2016-12-04 ENCOUNTER — Encounter (HOSPITAL_COMMUNITY): Payer: Medicaid Other

## 2016-12-04 ENCOUNTER — Encounter (HOSPITAL_COMMUNITY): Payer: Medicaid Other | Attending: Hematology & Oncology | Admitting: Adult Health

## 2016-12-04 ENCOUNTER — Other Ambulatory Visit (HOSPITAL_COMMUNITY): Payer: Self-pay | Admitting: Adult Health

## 2016-12-04 ENCOUNTER — Ambulatory Visit (HOSPITAL_COMMUNITY): Payer: Self-pay | Admitting: Hematology & Oncology

## 2016-12-04 VITALS — BP 119/88 | HR 96 | Temp 97.6°F | Resp 18 | Wt 109.0 lb

## 2016-12-04 DIAGNOSIS — E43 Unspecified severe protein-calorie malnutrition: Secondary | ICD-10-CM

## 2016-12-04 DIAGNOSIS — I252 Old myocardial infarction: Secondary | ICD-10-CM | POA: Diagnosis not present

## 2016-12-04 DIAGNOSIS — I509 Heart failure, unspecified: Secondary | ICD-10-CM | POA: Insufficient documentation

## 2016-12-04 DIAGNOSIS — F17210 Nicotine dependence, cigarettes, uncomplicated: Secondary | ICD-10-CM | POA: Insufficient documentation

## 2016-12-04 DIAGNOSIS — E785 Hyperlipidemia, unspecified: Secondary | ICD-10-CM | POA: Diagnosis not present

## 2016-12-04 DIAGNOSIS — Y842 Radiological procedure and radiotherapy as the cause of abnormal reaction of the patient, or of later complication, without mention of misadventure at the time of the procedure: Secondary | ICD-10-CM | POA: Diagnosis not present

## 2016-12-04 DIAGNOSIS — I255 Ischemic cardiomyopathy: Secondary | ICD-10-CM | POA: Diagnosis not present

## 2016-12-04 DIAGNOSIS — E871 Hypo-osmolality and hyponatremia: Secondary | ICD-10-CM | POA: Diagnosis not present

## 2016-12-04 DIAGNOSIS — C760 Malignant neoplasm of head, face and neck: Secondary | ICD-10-CM | POA: Insufficient documentation

## 2016-12-04 DIAGNOSIS — E039 Hypothyroidism, unspecified: Secondary | ICD-10-CM | POA: Diagnosis not present

## 2016-12-04 DIAGNOSIS — Z885 Allergy status to narcotic agent status: Secondary | ICD-10-CM | POA: Diagnosis not present

## 2016-12-04 DIAGNOSIS — R51 Headache: Secondary | ICD-10-CM | POA: Diagnosis not present

## 2016-12-04 DIAGNOSIS — Z8249 Family history of ischemic heart disease and other diseases of the circulatory system: Secondary | ICD-10-CM | POA: Insufficient documentation

## 2016-12-04 DIAGNOSIS — I110 Hypertensive heart disease with heart failure: Secondary | ICD-10-CM | POA: Insufficient documentation

## 2016-12-04 DIAGNOSIS — Z931 Gastrostomy status: Secondary | ICD-10-CM | POA: Diagnosis not present

## 2016-12-04 DIAGNOSIS — I480 Paroxysmal atrial fibrillation: Secondary | ICD-10-CM | POA: Insufficient documentation

## 2016-12-04 DIAGNOSIS — Z7901 Long term (current) use of anticoagulants: Secondary | ICD-10-CM | POA: Insufficient documentation

## 2016-12-04 DIAGNOSIS — R05 Cough: Secondary | ICD-10-CM | POA: Diagnosis not present

## 2016-12-04 DIAGNOSIS — Z923 Personal history of irradiation: Secondary | ICD-10-CM | POA: Insufficient documentation

## 2016-12-04 DIAGNOSIS — I700 Atherosclerosis of aorta: Secondary | ICD-10-CM | POA: Insufficient documentation

## 2016-12-04 DIAGNOSIS — E860 Dehydration: Secondary | ICD-10-CM

## 2016-12-04 DIAGNOSIS — Z9221 Personal history of antineoplastic chemotherapy: Secondary | ICD-10-CM | POA: Diagnosis not present

## 2016-12-04 DIAGNOSIS — Z79899 Other long term (current) drug therapy: Secondary | ICD-10-CM | POA: Insufficient documentation

## 2016-12-04 DIAGNOSIS — Z9889 Other specified postprocedural states: Secondary | ICD-10-CM | POA: Diagnosis not present

## 2016-12-04 LAB — CBC WITH DIFFERENTIAL/PLATELET
Basophils Absolute: 0 10*3/uL (ref 0.0–0.1)
Basophils Relative: 0 %
Eosinophils Absolute: 0.6 10*3/uL (ref 0.0–0.7)
Eosinophils Relative: 7 %
HEMATOCRIT: 32 % — AB (ref 36.0–46.0)
HEMOGLOBIN: 10.6 g/dL — AB (ref 12.0–15.0)
LYMPHS ABS: 0.7 10*3/uL (ref 0.7–4.0)
LYMPHS PCT: 8 %
MCH: 30.7 pg (ref 26.0–34.0)
MCHC: 33.1 g/dL (ref 30.0–36.0)
MCV: 92.8 fL (ref 78.0–100.0)
MONO ABS: 0.7 10*3/uL (ref 0.1–1.0)
MONOS PCT: 7 %
NEUTROS ABS: 7 10*3/uL (ref 1.7–7.7)
NEUTROS PCT: 78 %
Platelets: 288 10*3/uL (ref 150–400)
RBC: 3.45 MIL/uL — ABNORMAL LOW (ref 3.87–5.11)
RDW: 16.5 % — AB (ref 11.5–15.5)
WBC: 8.9 10*3/uL (ref 4.0–10.5)

## 2016-12-04 LAB — COMPREHENSIVE METABOLIC PANEL
ALK PHOS: 127 U/L — AB (ref 38–126)
ALT: 63 U/L — ABNORMAL HIGH (ref 14–54)
ANION GAP: 6 (ref 5–15)
AST: 33 U/L (ref 15–41)
Albumin: 3.7 g/dL (ref 3.5–5.0)
BILIRUBIN TOTAL: 0.5 mg/dL (ref 0.3–1.2)
BUN: 35 mg/dL — AB (ref 6–20)
CALCIUM: 9.9 mg/dL (ref 8.9–10.3)
CO2: 34 mmol/L — ABNORMAL HIGH (ref 22–32)
Chloride: 84 mmol/L — ABNORMAL LOW (ref 101–111)
Creatinine, Ser: 1.05 mg/dL — ABNORMAL HIGH (ref 0.44–1.00)
GFR calc Af Amer: >60 mL/min (ref >60)
GFR calc non Af Amer: 57 mL/min — ABNORMAL LOW (ref >60)
Glucose, Bld: 109 mg/dL — ABNORMAL HIGH (ref 65–99)
POTASSIUM: 4.3 mmol/L (ref 3.5–5.1)
Sodium: 124 mmol/L — ABNORMAL LOW (ref 135–145)
TOTAL PROTEIN: 7.3 g/dL (ref 6.5–8.1)

## 2016-12-04 LAB — T4, FREE: Free T4: 0.91 ng/dL (ref 0.61–1.12)

## 2016-12-04 LAB — TSH: TSH: 15.473 u[IU]/mL — ABNORMAL HIGH (ref 0.350–4.500)

## 2016-12-04 MED ORDER — LEVOTHYROXINE SODIUM 75 MCG PO TABS: 75.0000 ug | ORAL_TABLET | Freq: Every day | ORAL | 3 refills | Status: AC

## 2016-12-04 MED ORDER — SODIUM CHLORIDE 0.9 % IV SOLN: INTRAVENOUS | Status: AC

## 2016-12-04 NOTE — Progress Notes (Signed)
Facility was called to inform them that the patient needed to return tomorrow for fluids, they informed me that I needed to call Sidonie Dickens who does the transportation.  I called her on her cell at [phone], and she verbalized understanding that the patient needs to return tomorrow.  Message also left for Burtis Junes, dietician, that the patient needs to be evaluated for possible need to restart tube feedings.  Message left that she will be in the clinic tomorrow at 11:30 for about 4 hours.  Dosage change to Levothyroxine to 38mcg called in to the nurse, Rosalee, as well as faxed over. (received confirmation of delivery)

## 2016-12-04 NOTE — Patient Instructions (Addendum)
[place] at [facility] Discharge Instructions  RECOMMENDATIONS MADE BY THE CONSULTANT AND ANY TEST RESULTS WILL BE SENT TO YOUR REFERRING PHYSICIAN.  Exam with Mike Craze, NP. Return to the clinic in 3 months with labs. Please see the lab as you leave to have labs drawn today.   Thank you for choosing [place] at [facility] to provide your oncology and hematology care.  To afford each patient quality time with our provider, please arrive at least 15 minutes before your scheduled appointment time.    If you have a lab appointment with the [facility] please come in thru the  Main Entrance and check in at the main information desk  You need to re-schedule your appointment should you arrive 10 or more minutes late.  We strive to give you quality time with our providers, and arriving late affects you and other patients whose appointments are after yours.  Also, if you no show three or more times for appointments you may be dismissed from the clinic at the providers discretion.     Again, thank you for choosing [facility].  Our hope is that these requests will decrease the amount of time that you wait before being seen by our physicians.       _____________________________________________________________  Should you have questions after your visit to [facility], please contact our office at [phone] between the hours of 8:30 a.m. and 4:30 p.m.  Voicemails left after 4:30 p.m. will not be returned until the following business day.  For prescription refill requests, have your pharmacy contact our office.       Resources For Cancer Patients and their Caregivers ? American Cancer Society: Can assist with transportation, wigs, general needs, runs Look Good Feel Better.        (531)586-9373 ? Cancer Care: Provides financial assistance, online support groups, medication/co-pay assistance.  1-800-813-HOPE  (228) 696-3725) ? [place] Assists [place] cancer patients and their families through emotional , educational and financial support.  (650) 853-3186 ? [place] DSS Where to apply for food stamps, Medicaid and utility assistance. 3144692726 ? [employer]: Transportation to medical appointments. 220-503-9851 ? Social Security Administration: May apply for disability if have a Stage IV cancer. 530-435-8887 316-339-3965 ? [employer], Disability and Transit Services: Assists with nutrition, care and transit needs. [place] Support Programs: @10RELATIVEDAYS @ > Cancer Support Group  2nd Tuesday of the month 1pm-2pm, Journey Room  > Creative Journey  3rd Tuesday of the month 1130am-1pm, Journey Room  > Look Good Feel Better  1st Wednesday of the month 10am-12 noon, Journey Room (Call [place] to register [phone])

## 2016-12-04 NOTE — Progress Notes (Signed)
[facility] NOTE  Patient Care Team: Noel Journey. 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 Benjamine Mola 12/21/2015 No obvious primary lesion noted on panendoscopy examination CT neck 12/17/2015 large infiltrative tumor mass extends to skin surface, infiltrates R patorid, R submandibular, R carotid sheath,Thrombosis of R IJ vein, multiple satellite nodules Centrilobular emphysema P16 negative Tobacco Abuse Port a cath/FT placement on 01/17/2016 Multiple dental extractions with Dr. Enrique Sack 01/13/3016   BRIEF ONCOLOGIC HISTORY:    Squamous cell carcinoma of head and neck ([place])   12/17/2015 Imaging    CT neck- large infiltrative tumor mass extends to skin surface, infiltrates R patorid, R submandibular, R carotid sheath,Thrombosis of R IJ vein, multiple satellite nodules      12/21/2015 Procedure    Soft tissue, biopsy, right neck mass by Dr. Benjamine Mola  Direct laryngoscopy, rigid esophagoscopy, and bronchoscopy       12/21/2015 Pathology Results    - SQUAMOUS CELL CARCINOMA.  The carcinoma appears moderately to poorly differentiated.  P16 NEGATIVE.      12/28/2015 Initial Diagnosis    Squamous cell carcinoma of head and neck ([place])      01/03/2016 Imaging    CT CAP- Large right-sided neck mass. No evidence for lymphadenopathy or metastatic disease in the chest, abdomen, or pelvis. 9 mm ground-glass nodule in the posterior right lung apex is probably related to scarring given the underlying emphysema      01/04/2016 Imaging    Bone scan- No evidence of metastatic disease.      01/17/2016 Procedure    IR port and feeding tube placed      01/19/2016 PET scan    The dominant  right neck mass is hypermetabolic. In addition, there are hypermetabolic smaller cervical lymph nodes bilaterally.  No discrete lesion of the pharyngeal mucosal space identified. The dominant right neck mass and its hypermetabolic activi...      01/31/2016 -  Chemotherapy    Cisplatin      02/01/2016 - 03/28/2016 Radiation Therapy    70 Gy by Dr. Isidore Moos.      03/03/2016 - 03/10/2016 Hospital Admission    NSTEMI, ARF, CHF, Afib with RVR, respiratory failure with hypoxia      06/26/2016 PET scan    At least a significant partial metabolic response. Infiltrative mildly hypermetabolic right lateral neck mass is significantly decreased in size and metabolism. The residual mild hypermetabolism within the infiltrative right lateral neck mass is nonspecific and could represent post treatment change versus residual tumor. No additional residual hypermetabolic lymph nodes in the neck. No new hypermetabolic metastatic disease. New 3 mm right lower lobe pulmonary nodule, nonspecific and below PET resolution.       INTERVAL HISTORY:  Nuri Ticknor 61 y.o. female is here for continued follow-up of squamous cell carcinoma of the (R) neck with extension into the skin, right parotid, right submandibular area, right carotid sheath, and multiple satellite nodules.  She received concurrent chemoradiation with Cisplatin x 2 doses; treatment course was complicated by hospitalization for NSTEMI, ARF, CHF, A-fib, hypoxia, and respiratory failure from 03/03/16-03/10/16.    From 10/13/16-10/19/16, she was again hospitalized for ARF/CHF, right pleural effusion requiring thoracentesis, and acute encephalopathy. She was discharged from the hospital to SNF,  [facility], where she remains at present.    Ms. Holt is here today; seen seated wearing portable O2 at 2L; O2 sats 100% per vital signs. She has mild chronic cough; denies shortness of breath. "I don't even feel like I need this oxygen. Sometimes I take it off at  night because it makes my nose and mouth so dry and I don't think I need it."  She continues to smoke about 1 cigarette per day; "I think I deserve that one thing in my life that I enjoy."    She tells me her appetite is good; "I would eat more food if it tasted better and they let me use some salt at the place where I live."  She has some dysphagia, more often with some solid foods. She attributes this to her right neck mass "pressing on my neck making it hard for me to swallow."  She has lost 10 lbs since 08/2016.  Her G-tube is in place; it is only used for water flushes; no tube feedings are being done at this time.    Endorses xerostomia; does not consistently use Biotene products/saliva substitutes.    She is currently on Synthroid; her MAR from SNF was reviewed and she is currently on 50 mcg.  It looks like there was a recent change in dose from 25 mcg, but unclear when dose was increased; pt not sure.   She feels like her mood is overall pretty good. She tells me she works with the physical therapist at the [facility]. She feels like her memory is bad; denies any periods of confusion.  When asked about pain, she states, "Sometimes my back hurts from sitting in the bed all day. Otherwise, I don't really have any pain, except a little headache every once in awhile."    She tells me that Dr. Isidore Moos is considering sending her for surgery; she does not know where or the details for this referral.  I will follow-up with Dr. Isidore Moos.     MEDICAL HISTORY:  Past Medical History:  Diagnosis Date  . Arthritis   . Centrilobular emphysema ([employer])   . Edentulous    Multiple dental extractions March 2017  . Essential hypertension   . Hyperlipidemia   . Ischemic cardiomyopathy    LVEF 40-45%  . NSTEMI (non-ST elevated myocardial infarction) [facility])    April 2017 - managed medically due to comorbid illnesses  . PAF (paroxysmal atrial fibrillation) ([place])   . Squamous cell carcinoma    Invasive -  right neck, XRT and Cisplatin - Dr. Whitney Muse    SURGICAL HISTORY: Past Surgical History:  Procedure Laterality Date  . CESAREAN SECTION    . IR GENERIC HISTORICAL  08/07/2016   IR PATIENT EVAL TECH 0-60 MINS [facility]-INTERV RAD  . KNEE ARTHROSCOPY Right   . MASS BIOPSY Right 12/21/2015   Procedure: NECK MASS BIOPSY;  Surgeon: Leta Baptist, MD;  Location: [place];  Service: ENT;  Laterality: Right;  . MULTIPLE EXTRACTIONS WITH ALVEOLOPLASTY N/A 01/13/2016   Procedure: EXTRACTION OF TOOTH #'S 6-12,15, 19-28, 30 WITH ALVEOLOPLASTY AND BILATERAL MAXILLARY TUBEROSITY REDUCTIONS;  Surgeon: Lenn Cal, DDS;  Location: [facility];  Service: Oral Surgery;  Laterality: N/A;  . PANENDOSCOPY N/A 12/21/2015   Procedure: PANENDOSCOPY;  Surgeon: Leta Baptist, MD;  Location: [place];  Service: ENT;  Laterality: N/A;  . TONSILLECTOMY      SOCIAL HISTORY: Social History   Social History  . Marital status:  Divorced    Spouse name: N/A  . Number of children: 1  . Years of education: N/A   Occupational History  . Care giver    Social History Main Topics  . Smoking status: Current Some Day Smoker    Packs/day: 0.10    Years: 20.00    Types: Cigarettes  . Smokeless tobacco: Never Used     Comment: She says that she would like to have a few puffs but she has not had any.    . Alcohol use No  . Drug use: No  . Sexual activity: No   Other Topics Concern  . Not on file   Social History Narrative   One child in prison.    She is divorced. One daughter, no grandchildren Smokes, notes that she is down to 3 a day. Began smoking "probably in her 75's." She says she has quit "on and off through the years." Denies problems with EtOH. 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 Quit working when her daughter was "of age to go to school," then stayed home with her daughter. For hobbies, she says she likes  to read.  Is currently the caregiver for a lady with diabetes.  She does light housekeeping and keeps her on her medications, and cooks. The son of the woman she cares for, Herbie Baltimore, notes that her good cooking has brought his momma's blood pressure and blood sugar down.  She notes she has a good support system with her sister, and with the son of the woman she takes care of.  FAMILY HISTORY: Family History  Problem Relation Age of Onset  . Alzheimer's disease Mother   . Congestive Heart Failure Father 69   indicated that her mother is deceased. She indicated that her father is deceased.    Mother and father are deceased. Mother was 49; died of Alzheimer's. Father was 6; congestive heart failure. Has a sister, age 41 this year. Had a brother, deceased. He took his own life. 4 nieces and nephews.  ALLERGIES:  is allergic to codeine.  MEDICATIONS:  Current Outpatient Prescriptions  Medication Sig Dispense Refill  . acetaminophen (TYLENOL) 500 MG tablet Take 1,000 mg by mouth every 6 (six) hours as needed for mild pain or moderate pain.     Marland Kitchen apixaban (ELIQUIS) 5 MG TABS tablet Take 1 tablet (5 mg total) by mouth 2 (two) times daily. 60 tablet 3  . atorvastatin (LIPITOR) 40 MG tablet Take 1 tablet (40 mg total) by mouth daily at 6 PM. 30 tablet 3  . carvedilol (COREG) 6.25 MG tablet Take 1 tablet (6.25 mg total) by mouth 2 (two) times daily with a meal. 60 tablet 0  . diphenhydrAMINE (BENADRYL) 25 MG tablet Take 25 mg by mouth every 6 (six) hours as needed. Reported on 03/13/2016    . feeding supplement, ENSURE ENLIVE, (ENSURE ENLIVE) LIQD Take 237 mLs by mouth 2 (two) times daily between meals. 237 mL 0  . furosemide (LASIX) 40 MG tablet Take 1 tablet (40 mg total) by mouth daily. 30 tablet 0  . guaiFENesin (MUCINEX) 600 MG 12 hr tablet Take by mouth 2 (two) times daily.    Marland Kitchen lisinopril (PRINIVIL,ZESTRIL) 5 MG tablet Take 1 tablet (5 mg total) by mouth daily. 30 tablet 0  . LORazepam  (ATIVAN) 0.5 MG tablet Take 1 tablet (0.5 mg total) by mouth every 4 (four) hours as needed for anxiety (  one tab every 4-6 hours as needed for nausea and vomiting). 45 tablet 1  . Multiple Vitamins-Minerals (MULTIVITAMIN WITH MINERALS) tablet Take 1 tablet by mouth daily.    . ondansetron (ZOFRAN-ODT) 4 MG disintegrating tablet TAKE ONE TABLET BY MOUTH EVERY 8 HOURS. AS NEEDED FOR NAUSEA AND VOMITING 20 tablet 3  . potassium chloride (KLOR-CON) 20 MEQ packet Take by mouth 2 (two) times daily.    . prochlorperazine (COMPAZINE) 10 MG tablet Take 1 tablet (10 mg total) by mouth every 6 (six) hours as needed for nausea or vomiting. 30 tablet 2  . levothyroxine (SYNTHROID) 75 MCG tablet Take 1 tablet (75 mcg total) by mouth daily before breakfast. 30 tablet 3  . lidocaine-prilocaine (EMLA) cream Apply a quarter size amount to port site 1 hour prior to chemo. Do not rub in. Cover with plastic wrap. (Patient not taking: Reported on 12/04/2016) 30 g 3  . nitroGLYCERIN (NITROSTAT) 0.4 MG SL tablet Place 1 tablet (0.4 mg total) under the tongue every 5 (five) minutes x 3 doses as needed for chest pain. (Patient not taking: Reported on 12/04/2016) 15 tablet 0   No current facility-administered medications for this visit.    Facility-Administered Medications Ordered in Other Visits  Medication Dose Route Frequency Provider Last Rate Last Dose  . [START ON 12/05/2016] 0.9 %  sodium chloride infusion   Intravenous Continuous Holley Bouche, NP      . sodium polystyrene (KAYEXALATE) 15 GM/60ML suspension 30 g  30 g Oral Once Baird Cancer, PA-C        Review of Systems  Constitutional: Positive for weight loss (10 lbs since 08/2016). Negative for chills and fever.  HENT: Negative for hearing loss, nosebleeds and tinnitus. Sore throat: more sore at night.        Hoarse voice. Dysphagia with certain solid foods Xerostomia   Eyes: Negative.  Negative for blurred vision, double vision, pain and discharge.    Respiratory: Positive for cough (chronic dry cough). Negative for hemoptysis, shortness of breath and wheezing.   Cardiovascular: Negative for chest pain and palpitations.  Gastrointestinal: Negative.  Negative for abdominal pain, blood in stool, constipation, diarrhea, heartburn, melena, nausea and vomiting.  Genitourinary: Negative.  Negative for dysuria and hematuria.  Musculoskeletal: Positive for back pain. Negative for falls, joint pain and myalgias.  Skin: Negative.  Negative for itching and rash.  Neurological: Positive for headaches. Negative for dizziness.  Endo/Heme/Allergies: Negative.  Does not bruise/bleed easily.  Psychiatric/Behavioral: Positive for memory loss. Negative for depression. The patient is not nervous/anxious.   All other systems reviewed and are negative. 14 point ROS was done and is otherwise as detailed above or in HPI   PHYSICAL EXAMINATION: ECOG PERFORMANCE STATUS: 2   Vitals:   12/04/16 0958  BP: 119/88  Pulse: 96  Resp: 18  Temp: 97.6 F (36.4 C)   Filed Weights   12/04/16 0958  Weight: 109 lb (49.4 kg)     Physical Exam  Constitutional: She is oriented to person, place, and time and well-developed, well-nourished, and in no distress.  Wears glasses.   HENT:  Head: Normocephalic and atraumatic.  Nose: Nose normal.  Mouth/Throat: Mucous membranes are dry. No oropharyngeal exudate.  Mild submental and anterior neck lymphedema.    Eyes: Conjunctivae and EOM are normal. Pupils are equal, round, and reactive to light. Right eye exhibits no discharge. Left eye exhibits no discharge. No scleral icterus.  Neck: Normal range of motion. Neck supple.    (  R) neck with ? Adenopathy vs residual mass.  No cervical adenopathy to left neck.   Cardiovascular: Normal rate, regular rhythm and normal heart sounds.  Exam reveals no gallop and no friction rub.   No murmur heard. Pulmonary/Chest: Effort normal. She has no wheezes. She has no rales.   Diminished breath sounds to bases bilaterally   Abdominal: Soft. Bowel sounds are normal. She exhibits no distension and no mass. There is no tenderness. There is no rebound and no guarding.  G-tube in place; dressing intact.   Musculoskeletal: Normal range of motion.  Neurological: She is alert and oriented to person, place, and time. No cranial nerve deficit. Gait normal. Coordination normal.  Skin: Skin is warm and dry. No rash noted.  Psychiatric: Mood, memory, affect and judgment normal.  Nursing note and vitals reviewed.   LABORATORY DATA:  I have reviewed the data as listed  Lab Results  Component Value Date   WBC 8.9 12/04/2016   HGB 10.6 (L) 12/04/2016   HCT 32.0 (L) 12/04/2016   MCV 92.8 12/04/2016   PLT 288 12/04/2016   CMP     Component Value Date/Time   NA 124 (L) 12/04/2016 1050   K 4.3 12/04/2016 1050   CL 84 (L) 12/04/2016 1050   CO2 34 (H) 12/04/2016 1050   GLUCOSE 109 (H) 12/04/2016 1050   BUN 35 (H) 12/04/2016 1050   CREATININE 1.05 (H) 12/04/2016 1050   CALCIUM 9.9 12/04/2016 1050   PROT 7.3 12/04/2016 1050   ALBUMIN 3.7 12/04/2016 1050   AST 33 12/04/2016 1050   ALT 63 (H) 12/04/2016 1050   ALKPHOS 127 (H) 12/04/2016 1050   BILITOT 0.5 12/04/2016 1050   GFRNONAA 57 (L) 12/04/2016 1050   GFRAA >60 12/04/2016 1050   Results for KASLYN, HENNESSEY (MRN [id number])   Ref. Range 12/04/2016 10:50  TSH Latest Ref Range: 0.350 - 4.500 uIU/mL 15.473 (H)  T4,Free(Direct) Latest Ref Range: 0.61 - 1.12 ng/dL 0.91    RADIOGRAPHIC STUDIES: I have personally reviewed the radiological reports as listed below.   PET scan: 06/26/16 CLINICAL DATA:  Subsequent treatment strategy for squamous cell carcinoma of the right neck status post chemotherapy and radiation therapy. Restaging.  EXAM: NUCLEAR MEDICINE PET SKULL BASE TO THIGH  TECHNIQUE: 6.3 mCi F-18 FDG was injected intravenously. Full-ring PET imaging was performed from the skull base to thigh after the  radiotracer. CT data was obtained and used for attenuation correction and anatomic localization.  FASTING BLOOD GLUCOSE:  Value: 117 mg/dl  COMPARISON:  01/19/2016 PET-CT.  FINDINGS: NECK  Infiltrative right lateral neck mass centered at the level of the inferior hyoid bone measures 4.2 x 3.2 cm (series 4/ image 36) with mild residual hypermetabolism with max SUV 5.5, previously 7.0 x 6.8 cm with max SUV 20.7, significantly decreased in size and metabolism.  No additional hypermetabolic masses / lymph nodes remain in the neck. No discrete hypermetabolic mass in the pharynx or larynx.  Mucoperiosteal thickening in the right maxillary sinus.  CHEST  No hypermetabolic axillary, mediastinal or hilar nodes. Left internal jugular MediPort terminates at the cavoatrial junction. Right coronary atherosclerosis. Atherosclerotic nonaneurysmal thoracic aorta. Trace right pleural effusion. No left pleural effusion. Moderate centrilobular emphysema and mild diffuse bronchial wall thickening. There is new interlobular septal thickening and patchy ground-glass opacity at the right greater than left lung apices with associated mild hypermetabolism with max SUV 3.7, favor evolving radiation change. New 3 mm solid anterior right lower lobe pulmonary nodule (series  8/image 30), below PET resolution. Thick parenchymal band in the basilar right lower lobe is new and non hypermetabolic, most consistent with atelectasis and/or evolving postinfectious/postinflammatory scarring.  ABDOMEN/PELVIS  No abnormal hypermetabolic activity within the liver, pancreas, adrenal glands, or spleen. No hypermetabolic lymph nodes in the abdomen or pelvis. Cholelithiasis. Percutaneous gastrostomy tube is in place in the gastric body. Marked sigmoid diverticulosis.  SKELETON  No focal hypermetabolic activity to suggest skeletal metastasis.  IMPRESSION: 1. At least a significant partial metabolic  response. Infiltrative mildly hypermetabolic right lateral neck mass is significantly decreased in size and metabolism. The residual mild hypermetabolism within the infiltrative right lateral neck mass is nonspecific and could represent post treatment change versus residual tumor. No additional residual hypermetabolic lymph nodes in the neck. No new hypermetabolic metastatic disease. 2. New 3 mm right lower lobe pulmonary nodule, nonspecific and below PET resolution. Recommend attention on a follow-up chest CT in 3-6 months. 3. New septal thickening and patchy ground-glass opacity at the lung apices with associated mild hypermetabolism, favor evolving radiation change. 4. Additional findings include aortic atherosclerosis, trace right pleural effusion, moderate emphysema, cholelithiasis and marked sigmoid diverticulosis.   Electronically Signed   By: Ilona Sorrel M.D.   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 Benjamine Mola 12/21/2015 No obvious primary lesion noted on panendoscopy examination CT neck 12/17/2015 large infiltrative tumor mass extends to skin surface, infiltrates R patorid, R submandibular, R carotid sheath,Thrombosis of R IJ vein, multiple satellite nodules Centrilobular emphysema P16 negative Tobacco Use Tobacco Abuse Port a cath/FT placement on 01/17/2016 Multiple dental extractions with Dr. Enrique Sack 01/13/3016 Hyponatremia HTN NSTEMI Macrocytosis, normal B12, folate Anemia Hypothyroidism.   Squamous cell carcinoma (R) neck:  -Residual disease noted on most recent CT neck on 10/13/16. Clinically, she has residual mass palpated on physical exam.  Given her residual disease, she is at high-risk for metastatic disease. CT chest was done on  10/13/16 and showed stable small nodules in RLL of lung. Otherwise, no evidence of metastatic disease in the chest was noted.  I discussed her case with Dr. Eppie Gibson, the patient's treating Radiation Oncologist. If the patient is interested in pursuing curative therapy, we can send a consult for surgical evaluation.  However, this does not seem like a viable option as the patient would not be an ideal surgical candidate given her heart failure (most recent ECHO on 10/14/16 showed EF 10-15%). I will plan on having further goals of care discussions with her in the near future.  Hospice may be appropriate at this time.    Hyponatremia with elevated BUN/Creatinine; Heart failure:  -Clinically, she appears to be slightly dehydrated.  We will make arrangements to bring her in tomorrow & Wednesday for very gentle hydration with NS 250 mL each day, infusing slowly at 75 mL/hr.  She has known heart failure and will require diligent nursing monitoring during infusion. Nursing aware.  Maintain adequate follow-up with cardiology.   Hypothyroidism:  -After further discussions with [facility] staff, it was determined that the patient's most recent Synthroid dose adjustment was done in 09/2016, over 6 weeks ago.  Her TSH remains quite elevated at 15.473.  I will increase her Synthroid dose to 75 mcg beginning tomorrow.  Prescription printed and faxed  to the [facility].   Encouraged adherence to having the patient take the Synthroid on an empty stomach first thing in the morning before breakfast.  We will plan on rechecking TSH at her next follow-up visit.    Protein calorie malnutrition, severe:  -Weight loss likely secondary to dysphagia, xerostomia, and her disease burden.  She continues to lose weight, which is very concerning.  She has a G-tube that is currently not being used for nutrition.  We will consult Burtis Junes, RD to help facilitate tube feeding orders to supplement her oral intake, which is  inadequate. We will try to optimize her nutrition and see if we can help her gain or decrease her rapid weight loss.     Dispo:  -Return to cancer center tomorrow & Wednesday for gentle IVF hydration.  -Return to cancer center in 3 months with labs.    All questions answered to patient's stated satisfaction.  Encouraged her to call us with any questions or concerns before her next appointment at the cancer center.   Plan of care discussed with Dr. Twana First.   Mike Craze, NP [place] [phone]

## 2016-12-05 ENCOUNTER — Other Ambulatory Visit (HOSPITAL_COMMUNITY): Payer: Self-pay | Admitting: Oncology

## 2016-12-05 ENCOUNTER — Ambulatory Visit (HOSPITAL_COMMUNITY): Payer: Self-pay | Admitting: Hematology & Oncology

## 2016-12-05 ENCOUNTER — Encounter (HOSPITAL_COMMUNITY): Payer: Medicaid Other | Attending: Oncology

## 2016-12-05 ENCOUNTER — Encounter (HOSPITAL_COMMUNITY): Payer: Self-pay

## 2016-12-05 DIAGNOSIS — E860 Dehydration: Secondary | ICD-10-CM

## 2016-12-05 DIAGNOSIS — E871 Hypo-osmolality and hyponatremia: Secondary | ICD-10-CM

## 2016-12-05 DIAGNOSIS — C760 Malignant neoplasm of head, face and neck: Secondary | ICD-10-CM

## 2016-12-05 MED ORDER — HEPARIN SOD (PORK) LOCK FLUSH 100 UNIT/ML IV SOLN
500.0000 [IU] | Freq: Once | INTRAVENOUS | Status: AC
  Administered 2016-12-05: 500 [IU] via INTRAVENOUS

## 2016-12-05 MED ORDER — HEPARIN SOD (PORK) LOCK FLUSH 100 UNIT/ML IV SOLN
INTRAVENOUS | Status: AC
  Filled 2016-12-05: qty 5

## 2016-12-05 MED ORDER — SODIUM CHLORIDE 0.9 % IV SOLN
INTRAVENOUS | Status: DC
  Administered 2016-12-05: 11:00:00 via INTRAVENOUS

## 2016-12-05 NOTE — Patient Instructions (Signed)
[place] at [facility] Discharge Instructions  RECOMMENDATIONS MADE BY THE CONSULTANT AND ANY TEST RESULTS WILL BE SENT TO YOUR REFERRING PHYSICIAN.  Fluids given today  Follow up as scheduled.  Thank you for choosing [place] at [facility] to provide your oncology and hematology care.  To afford each patient quality time with our provider, please arrive at least 15 minutes before your scheduled appointment time.    If you have a lab appointment with the [facility] please come in thru the  Main Entrance and check in at the main information desk  You need to re-schedule your appointment should you arrive 10 or more minutes late.  We strive to give you quality time with our providers, and arriving late affects you and other patients whose appointments are after yours.  Also, if you no show three or more times for appointments you may be dismissed from the clinic at the providers discretion.     Again, thank you for choosing [facility].  Our hope is that these requests will decrease the amount of time that you wait before being seen by our physicians.       _____________________________________________________________  Should you have questions after your visit to [facility], please contact our office at [phone] between the hours of 8:30 a.m. and 4:30 p.m.  Voicemails left after 4:30 p.m. will not be returned until the following business day.  For prescription refill requests, have your pharmacy contact our office.       Resources For Cancer Patients and their Caregivers ? American Cancer Society: Can assist with transportation, wigs, general needs, runs Look Good Feel Better.        620-683-0528 ? Cancer Care: Provides financial assistance, online support groups, medication/co-pay assistance.  1-800-813-HOPE 519-718-9771) ? [place] Assists [place] cancer patients and  their families through emotional , educational and financial support.  864-258-1790 ? [place] DSS Where to apply for food stamps, Medicaid and utility assistance. 714-801-1701 ? RCATS: Transportation to medical appointments. 226-248-7708 ? Social Security Administration: May apply for disability if have a Stage IV cancer. 250 765 4804 862-753-1383 ? [employer], Disability and Transit Services: Assists with nutrition, care and transit needs. [place] Support Programs: @10RELATIVEDAYS @ > Cancer Support Group  2nd Tuesday of the month 1pm-2pm, Journey Room  > Creative Journey  3rd Tuesday of the month 1130am-1pm, Journey Room  > Look Good Feel Better  1st Wednesday of the month 10am-12 noon, Journey Room (Call [place] to register [phone])

## 2016-12-05 NOTE — Progress Notes (Signed)
IVF given per orders. Patient tolerated well. Vitals stable and discharged ambulatory from clinic with oxygen. Follow up as scheduled.

## 2016-12-06 ENCOUNTER — Encounter (HOSPITAL_COMMUNITY): Payer: Medicaid Other | Attending: Oncology

## 2016-12-06 ENCOUNTER — Other Ambulatory Visit (HOSPITAL_COMMUNITY): Payer: Self-pay | Admitting: Adult Health

## 2016-12-06 ENCOUNTER — Encounter: Payer: Self-pay | Admitting: Dietician

## 2016-12-06 ENCOUNTER — Encounter (HOSPITAL_COMMUNITY): Payer: Self-pay | Admitting: Adult Health

## 2016-12-06 VITALS — BP 104/65 | HR 77 | Temp 98.3°F | Resp 18

## 2016-12-06 DIAGNOSIS — E871 Hypo-osmolality and hyponatremia: Secondary | ICD-10-CM

## 2016-12-06 DIAGNOSIS — C760 Malignant neoplasm of head, face and neck: Secondary | ICD-10-CM

## 2016-12-06 DIAGNOSIS — E039 Hypothyroidism, unspecified: Secondary | ICD-10-CM

## 2016-12-06 MED ORDER — SODIUM CHLORIDE 0.9 % IV SOLN
INTRAVENOUS | Status: DC
  Administered 2016-12-06: 13:00:00 via INTRAVENOUS

## 2016-12-06 MED ORDER — HEPARIN SOD (PORK) LOCK FLUSH 100 UNIT/ML IV SOLN
500.0000 [IU] | Freq: Once | INTRAVENOUS | Status: AC
  Administered 2016-12-06: 500 [IU] via INTRAVENOUS
  Filled 2016-12-06: qty 5

## 2016-12-06 MED ORDER — ACETAMINOPHEN 325 MG PO TABS
650.0000 mg | ORAL_TABLET | Freq: Four times a day (QID) | ORAL | Status: DC | PRN
  Administered 2016-12-06: 650 mg via ORAL
  Filled 2016-12-06: qty 2

## 2016-12-06 MED ORDER — SODIUM CHLORIDE 0.9% FLUSH
10.0000 mL | INTRAVENOUS | Status: DC | PRN
  Administered 2016-12-06: 10 mL via INTRAVENOUS
  Filled 2016-12-06: qty 10

## 2016-12-06 NOTE — Progress Notes (Signed)
Follow up with H&N. Last spoke with 3 months ago.   Early December, had presented to [facility] ED with AMS. At that time had significant heart failure. 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 Has been at nursing facility since that discharge ~1.5 months ago  Wt Readings from Last 10 Encounters:  12/04/16 109 lb (49.4 kg)  10/19/16 116 lb 2.9 oz (52.7 kg)  09/04/16 130 lb (59 kg)  08/09/16 119 lb 3.2 oz (54.1 kg)  06/28/16 132 lb (59.9 kg)  05/26/16 122 lb (55.3 kg)  05/19/16 124 lb 14.4 oz (56.7 kg)  04/14/16 127 lb 9.6 oz (57.9 kg)  04/07/16 132 lb 12.8 oz (60.2 kg)  03/31/16 139 lb 1.6 oz (63.1 kg)   Patient weight has decreased by 10 lbs since she was last seen. Her weight fluctuates greatly due to her severe CHF.   Nursing had conveyed that the [facility] was not making use of the patients PEG.   ST at facility had reported the patient as eating 100% breakfast, 75% lunch and 50% Dinner.   Pt today says that she does not have any trouble swallowing. She does have trouble chewing due to her lack of teeth and says eating is "time conusming" because she has to "break the foods down with my tongue". She also says the food at the SNF is "terrible". Apparently it is very bland and she has been "surviving on PB&J sandwiches"  She states, before RD even mentioned the idea, that she wishes the facility would make use of her PEG "so that I didn't have to work so hard". She notes she felt stronger when she was infusing 3 cans of Osmolite 1.5 daily.   RD agreed that restarting the tube feeding is likely in her best interest. Though do not expect further treament for malignancy, she still has increased needs due to the actively growing cancer. She was all for the idea of restarting 3 cans/day.   She has severe HF. She pointed to a ~16 oz container at her bedside and says she drinks 3 of these daily. Will not order extra flushes via tube since she is consuming a significant amount of  fluids orally.   Tube feeding orders to be faxed to facility are:  1 can of Osmolite 1.5, 3x daily w/ 30 cc flush before and after. This provides: 1067 kcals, 45 g Pro, and 543 cc free water (+180 cc from flushes)  She denies any n/v/c/d.   She seems to like living at the SNF and she does not sound to have any current plans on leaving the facility.   Burtis Junes RD, LDN, [place] [facility] Pager: [phone] 12/06/2016 1:05 PM

## 2016-12-06 NOTE — Progress Notes (Signed)
Gina Frederick tolerated IV fluids well without incident. Port accessed with 20 gauge needle and flushed with 10 ml NS and 5 ml Heparin easily per protocol after hydration completed. VSS upon discharge. Pt discharged via wheelchair in satisfactory condition accompanied by personnel from [facility] for transport

## 2016-12-06 NOTE — Progress Notes (Signed)
I briefly saw Gina Frederick during her IVF infusion today.  I shared with her that I spoke with Dr. Isidore Moos re: possible treatment options going forward.  I discussed with Gina Frederick that if we are "going after a cure" for her H&N cancer, then we could send her for surgical evaluation, likely at [facility].  Gina Frederick states, "I just don't think I would survive a surgery and I don't want to die on the table."  She said, "I just don't think I can do any more treatment. I've already done chemo and radiation and I don't know if I can make it through treatment."    I expressed understanding of her concerns and shared that I, too, am concerned about further treatment for her H&N cancer given her functional status and CHF (last ECHO 10/14/16 EF 10-15%; ECHO previous EF 40-45% on 06/01/16).  Instead, we will continue to follow-up with her clinically.  She understands that her most recent imaging showed partial response and there is still likely active cancer.  She agrees to continue to follow-up here at the cancer center for now and treat her symptoms, as needed.   She has tolerated the past 2 days of very gentle hydration (total of 500 mL over 2 days). Clinically, she feels better.      Dispo:  We will bring her back in 3 months for continued follow-up.  She will return in 6-8 weeks for port flush and lab check to re-evaluate TSH, CBC with diff, & CMET.

## 2016-12-06 NOTE — Patient Instructions (Addendum)
[place] at [facility] Discharge Instructions  RECOMMENDATIONS MADE BY THE CONSULTANT AND ANY TEST RESULTS WILL BE SENT TO YOUR REFERRING PHYSICIAN.  Received IV fluids today.Take Synthroid on empty stomach every morning. Follow-up as scheduled. Call clinic for any questions or concerns  Thank you for choosing [place] at [facility] to provide your oncology and hematology care.  To afford each patient quality time with our provider, please arrive at least 15 minutes before your scheduled appointment time.    If you have a lab appointment with the [place] please come in thru the  Main Entrance and check in at the main information desk  You need to re-schedule your appointment should you arrive 10 or more minutes late.  We strive to give you quality time with our providers, and arriving late affects you and other patients whose appointments are after yours.  Also, if you no show three or more times for appointments you may be dismissed from the clinic at the providers discretion.     Again, thank you for choosing [facility].  Our hope is that these requests will decrease the amount of time that you wait before being seen by our physicians.       _____________________________________________________________  Should you have questions after your visit to [facility], please contact our office at [phone] between the hours of 8:30 a.m. and 4:30 p.m.  Voicemails left after 4:30 p.m. will not be returned until the following business day.  For prescription refill requests, have your pharmacy contact our office.       Resources For Cancer Patients and their Caregivers ? American Cancer Society: Can assist with transportation, wigs, general needs, runs Look Good Feel Better.        480-204-8179 ? Cancer Care: Provides financial assistance, online support groups, medication/co-pay assistance.  1-800-813-HOPE  781-766-5376) ? [place] Assists [place] cancer patients and their families through emotional , educational and financial support.  423-735-6919 ? [place] DSS Where to apply for food stamps, Medicaid and utility assistance. 7571523216 ? RCATS: Transportation to medical appointments. (870)615-9238 ? Social Security Administration: May apply for disability if have a Stage IV cancer. [phone] 562-047-5511 ? [employer], Disability and Transit Services: Assists with nutrition, care and transit needs. [place] Support Programs: @10RELATIVEDAYS @ > Cancer Support Group  2nd Tuesday of the month 1pm-2pm, Journey Room  > Creative Journey  3rd Tuesday of the month 1130am-1pm, Journey Room  > Look Good Feel Better  1st Wednesday of the month 10am-12 noon, Journey Room (Call [place] to register [phone])

## 2016-12-11 ENCOUNTER — Ambulatory Visit (INDEPENDENT_AMBULATORY_CARE_PROVIDER_SITE_OTHER): Payer: Medicaid Other | Admitting: Otolaryngology

## 2016-12-11 DIAGNOSIS — C148 Malignant neoplasm of overlapping sites of lip, oral cavity and pharynx: Secondary | ICD-10-CM | POA: Diagnosis not present

## 2017-01-08 ENCOUNTER — Ambulatory Visit: Payer: Self-pay | Admitting: Cardiology

## 2017-01-26 ENCOUNTER — Encounter (HOSPITAL_COMMUNITY): Payer: Self-pay

## 2017-02-12 ENCOUNTER — Ambulatory Visit: Payer: Self-pay | Admitting: Cardiology

## 2017-03-05 ENCOUNTER — Other Ambulatory Visit (HOSPITAL_COMMUNITY): Payer: Self-pay

## 2017-03-05 ENCOUNTER — Ambulatory Visit (HOSPITAL_COMMUNITY): Payer: Self-pay

## 2017-03-08 NOTE — Progress Notes (Signed)
Cardiology Office Note  Date: 03/09/2017   ID: Gina Frederick, DOB 18-Feb-1956, MRN [id number]  PCP: Langley Gauss, MD [facility]) Primary Cardiologist: Rozann Lesches, MD   Chief Complaint  Patient presents with  . Cardiomyopathy    History of Present Illness: Gina Frederick is a medically complex 61 y.o. female last seen in October 2017. I reviewed her records. She was hospitalized in December 2017 with hypoxic respiratory failure and pleural effusions, documentation of worsening cardiomyopathy with LVEF down to the range of 10-15%. She was treated with supportive measures and medical therapy adjustments. She was also seen by Palliative Care in light of significant comorbidities. 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  At the present time she is living in the [facility]. She has a feeding tube for most of her nutrition, is able to swallow some food but does not eat this way regularly. She can take her pills orally, some of them have to be crushed in soft foods. She states she has had no chest pain, has no leg edema. She is chronically short of breath and uses supplemental oxygen.  She is to undergo further ENT follow-up through [facility] and also in the [place] at [facility]. She states that her ENT tumor has enlarged, question remains now as to treatment plan.  I reviewed her present medications. Current cardiac regimen includes Eliquis for PAF stroke reduction, Lipitor, Lasix with potassium supplements, and lisinopril. She has not required any nitroglycerin.  Past Medical History:  Diagnosis Date  . Arthritis   . Centrilobular emphysema ([place])   . Edentulous    Multiple dental extractions March 2017  . Essential hypertension   . Hyperlipidemia   . Ischemic cardiomyopathy    LVEF 40-45%  . NSTEMI (non-ST elevated myocardial infarction) [facility])    April 2017 - managed medically due to comorbid illnesses   . PAF (paroxysmal atrial fibrillation) ([place])   . 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 Whitney Muse    Past Surgical History:  Procedure Laterality Date  . CESAREAN SECTION    . IR GENERIC HISTORICAL  08/07/2016   IR PATIENT EVAL TECH 0-60 MINS [facility]-INTERV RAD  . KNEE ARTHROSCOPY Right   . MASS BIOPSY Right 12/21/2015   Procedure: NECK MASS BIOPSY;  Surgeon: Leta Baptist, MD;  Location: [facility];  Service: ENT;  Laterality: Right;  . MULTIPLE EXTRACTIONS WITH ALVEOLOPLASTY N/A 01/13/2016   Procedure: EXTRACTION OF TOOTH #'S 6-12,15, 19-28, 30 WITH ALVEOLOPLASTY AND BILATERAL MAXILLARY TUBEROSITY REDUCTIONS;  Surgeon: Lenn Cal, DDS;  Location: [facility];  Service: Oral Surgery;  Laterality: N/A;  . PANENDOSCOPY N/A 12/21/2015   Procedure: PANENDOSCOPY;  Surgeon: Leta Baptist, MD;  Location: [place];  Service: ENT;  Laterality: N/A;  . TONSILLECTOMY      Current Outpatient Prescriptions  Medication Sig Dispense Refill  . acetaminophen (TYLENOL) 500 MG tablet Take 1,000 mg by mouth every 6 (six) hours as needed for mild pain or moderate pain.     Marland Kitchen apixaban (ELIQUIS) 5 MG TABS tablet Take 1 tablet (5 mg total) by mouth 2 (two) times daily. 60 tablet 3  . atorvastatin (LIPITOR) 40 MG tablet Take 1 tablet (40 mg total) by mouth daily at 6 PM. 30 tablet 3  . diphenhydrAMINE (BENADRYL) 25 MG tablet Take 25 mg by mouth every 6 (six) hours as needed. Reported on  03/13/2016    . feeding supplement, ENSURE ENLIVE, (ENSURE ENLIVE) LIQD Take 237 mLs by mouth 2 (two) times daily between meals. 237 mL 0  . furosemide (LASIX) 40 MG tablet Take 1 tablet (40 mg total) by mouth daily. 30 tablet 0  . guaiFENesin (MUCINEX) 600 MG 12 hr tablet Take by mouth 2 (two) times daily.    Marland Kitchen levothyroxine (SYNTHROID) 75 MCG tablet Take 1 tablet (75 mcg total) by mouth daily before breakfast. 30 tablet 3  . lidocaine-prilocaine (EMLA) cream Apply a quarter size  amount to port site 1 hour prior to chemo. Do not rub in. Cover with plastic wrap. 30 g 3  . lisinopril (PRINIVIL,ZESTRIL) 5 MG tablet Take 1 tablet (5 mg total) by mouth daily. 30 tablet 0  . LORazepam (ATIVAN) 0.5 MG tablet Take 1 tablet (0.5 mg total) by mouth every 4 (four) hours as needed for anxiety (one tab every 4-6 hours as needed for nausea and vomiting). 45 tablet 1  . Multiple Vitamins-Minerals (MULTIVITAMIN WITH MINERALS) tablet Take 1 tablet by mouth daily.    . nitroGLYCERIN (NITROSTAT) 0.4 MG SL tablet Place 1 tablet (0.4 mg total) under the tongue every 5 (five) minutes x 3 doses as needed for chest pain. 15 tablet 0  . ondansetron (ZOFRAN-ODT) 4 MG disintegrating tablet TAKE ONE TABLET BY MOUTH EVERY 8 HOURS. AS NEEDED FOR NAUSEA AND VOMITING 20 tablet 3  . OXYGEN Inhale into the lungs. 2L    . potassium chloride (KLOR-CON) 20 MEQ packet Take by mouth 2 (two) times daily.    . prochlorperazine (COMPAZINE) 10 MG tablet Take 1 tablet (10 mg total) by mouth every 6 (six) hours as needed for nausea or vomiting. 30 tablet 2   No current facility-administered medications for this visit.    Facility-Administered Medications Ordered in Other Visits  Medication Dose Route Frequency Provider Last Rate Last Dose  . sodium polystyrene (KAYEXALATE) 15 GM/60ML suspension 30 g  30 g Oral Once [facility], PA-C       Allergies:  Codeine   Social History: The patient  reports that she has been smoking Cigarettes.  She has a 2.00 pack-year smoking history. She has never used smokeless tobacco. She reports that she does not drink alcohol or use drugs.   ROS:  Please see the history of present illness. Otherwise, complete review of systems is positive for chronic fatigue and dyspnea.  All other systems are reviewed and negative.   Physical Exam: VS:  BP (!) 94/58   Pulse 89   Ht 5\' 4"  (1.626 m)   Wt 112 lb (50.8 kg)   SpO2 99%   BMI 19.22 kg/m , BMI Body mass index is 19.22  kg/m.  Wt Readings from Last 3 Encounters:  03/09/17 112 lb (50.8 kg)  12/04/16 109 lb (49.4 kg)  10/19/16 116 lb 2.9 oz (52.7 kg)    General: Chronically ill-appearing woman in no distress. Wearing oxygen via nasal cannula. HEENT: Conjunctiva and lids normal, mass right side of neck and face. Neck: Supple, no carotid bruits. Lungs: Decreased breath sounds without wheezing, nonlabored breathing at rest. Cardiac: Regular rate and rhythm, no S3, soft systolic murmur, no pericardial rub. Abdomen: Soft, nontender. Extremities: No pitting edema, distal pulses 1-2+. Skin: Warm and dry. Musculoskeletal: No kyphosis. Neuropsychiatric: Alert and oriented x3, affect grossly appropriate.  ECG: I personally reviewed the tracing from 10/13/2016 which showed sinus rhythm with left atrial enlargement, rightward axis, nonspecific ST segment changes, and  PVC.  Recent Labwork: 10/15/2016: Magnesium 1.9 10/18/2016: B Natriuretic Peptide 3,148.1 12/04/2016: ALT 63; AST 33; BUN 35; Creatinine, Ser 1.05; Hemoglobin 10.6; Platelets 288; Potassium 4.3; Sodium 124; TSH 15.473   Other Studies Reviewed Today:  Echocardiogram 10/14/2016: Study Conclusions  - Left ventricle: The cavity size was normal. Wall thickness was   increased in a pattern of moderate LVH. The estimated ejection   fraction was in the range of 10% to 15%. Diffuse hypokinesis.   Features are consistent with a pseudonormal left ventricular   filling pattern, with concomitant abnormal relaxation and   increased filling pressure (grade 2 diastolic dysfunction).   Doppler parameters are consistent with high ventricular filling   pressure. - Aortic valve: Mildly to moderately calcified annulus. Mildly   thickened leaflets. There was mild regurgitation. Valve area   (VTI): 1.53 cm^2. Valve area (Vmax): 2.03 cm^2. - Mitral valve: Mildly calcified annulus. Mildly thickened leaflets   . There was moderate regurgitation. The MR VC is 0.5  cm. - Left atrium: The atrium was moderately dilated. - Right ventricle: The cavity size was mildly dilated. - Right atrium: The atrium was mildly dilated. - Tricuspid valve: There was moderate-severe regurgitation. - Pulmonary arteries: Systolic pressure was moderately increased.   PA peak pressure: 64 mm Hg (S). - Technically adequate study.  Chest x-ray 10/14/2016: IMPRESSION: Stable left base opacification compatible with small effusion with atelectasis. Improved right base opacification likely improved effusions/ atelectasis post thoracentesis. No pneumothorax.  Stable cardiomegaly.  Left-sided Port-A-Cath unchanged.  Assessment and Plan:  1. History of cardiomyopathy, underlying ischemic heart disease suspected based on previous NSTEMI, but probably more of a mixed picture with nonischemic component and severe reduction in LVEF to the range of 10-15% by last assessment in December 2017. Medical therapy limited by somewhat low blood pressures. She is on ACE inhibitor, Lasix, and potassium supplements. We will obtain a follow-up echocardiogram to reevaluate LVEF as this may help in terms of prognostication particularly if further treatment options are going to be considered for her ENT cancer. I would expect that her surgical risk at this point would be fairly high however unless she has shown improvement in LVEF.  2. History of paroxysmal atrial fibrillation. She is on Eliquis for stroke prophylaxis.  3. History of hypertension, presently blood pressure low normal. She denies any dizziness or syncope.  4. Hyperlipidemia, on statin therapy.  Current medicines were reviewed with the patient today.   Orders Placed This Encounter  Procedures  . ECHOCARDIOGRAM COMPLETE    Disposition: Follow-up in 4-6 weeks.  Signed, Satira Sark, MD, [facility] 03/09/2017 1:40 PM    [facility] [place] [place], [place], [place] [address] Phone: [phone];  Fax: [phone]

## 2017-03-09 ENCOUNTER — Ambulatory Visit (INDEPENDENT_AMBULATORY_CARE_PROVIDER_SITE_OTHER): Payer: Medicaid Other | Admitting: Cardiology

## 2017-03-09 ENCOUNTER — Encounter: Payer: Self-pay | Admitting: Cardiology

## 2017-03-09 VITALS — BP 94/58 | HR 89 | Ht 64.0 in | Wt 112.0 lb

## 2017-03-09 DIAGNOSIS — I480 Paroxysmal atrial fibrillation: Secondary | ICD-10-CM | POA: Diagnosis not present

## 2017-03-09 DIAGNOSIS — E782 Mixed hyperlipidemia: Secondary | ICD-10-CM

## 2017-03-09 DIAGNOSIS — I10 Essential (primary) hypertension: Secondary | ICD-10-CM

## 2017-03-09 DIAGNOSIS — I429 Cardiomyopathy, unspecified: Secondary | ICD-10-CM | POA: Diagnosis not present

## 2017-03-09 NOTE — Patient Instructions (Signed)
Medication Instructions:  Continue all current medications.  Labwork: none  Testing/Procedures:  Your physician has requested that you have an echocardiogram. Echocardiography is a painless test that uses sound waves to create images of your heart. It provides your doctor with information about the size and shape of your heart and how well your heart's chambers and valves are working. This procedure takes approximately one hour. There are no restrictions for this procedure.  Office will contact with results via phone or letter.    Follow-Up: 4-6 weeks   Any Other Special Instructions Will Be Listed Below (If Applicable).  If you need a refill on your cardiac medications before your next appointment, please call your pharmacy.  

## 2017-03-12 ENCOUNTER — Ambulatory Visit (INDEPENDENT_AMBULATORY_CARE_PROVIDER_SITE_OTHER): Payer: Self-pay | Admitting: Otolaryngology

## 2017-03-16 ENCOUNTER — Encounter (HOSPITAL_BASED_OUTPATIENT_CLINIC_OR_DEPARTMENT_OTHER): Payer: Medicaid Other | Admitting: Oncology

## 2017-03-16 ENCOUNTER — Encounter (HOSPITAL_COMMUNITY): Payer: Self-pay | Admitting: Lab

## 2017-03-16 ENCOUNTER — Encounter (HOSPITAL_COMMUNITY): Payer: Medicaid Other | Attending: Oncology

## 2017-03-16 ENCOUNTER — Encounter (HOSPITAL_COMMUNITY): Payer: Self-pay

## 2017-03-16 VITALS — BP 111/65 | HR 111 | Temp 99.1°F | Resp 16 | Ht 64.0 in | Wt 113.0 lb

## 2017-03-16 DIAGNOSIS — R911 Solitary pulmonary nodule: Secondary | ICD-10-CM | POA: Insufficient documentation

## 2017-03-16 DIAGNOSIS — F17210 Nicotine dependence, cigarettes, uncomplicated: Secondary | ICD-10-CM | POA: Insufficient documentation

## 2017-03-16 DIAGNOSIS — E039 Hypothyroidism, unspecified: Secondary | ICD-10-CM | POA: Insufficient documentation

## 2017-03-16 DIAGNOSIS — K8020 Calculus of gallbladder without cholecystitis without obstruction: Secondary | ICD-10-CM | POA: Diagnosis not present

## 2017-03-16 DIAGNOSIS — Z720 Tobacco use: Secondary | ICD-10-CM | POA: Diagnosis not present

## 2017-03-16 DIAGNOSIS — J439 Emphysema, unspecified: Secondary | ICD-10-CM

## 2017-03-16 DIAGNOSIS — I10 Essential (primary) hypertension: Secondary | ICD-10-CM | POA: Insufficient documentation

## 2017-03-16 DIAGNOSIS — C760 Malignant neoplasm of head, face and neck: Secondary | ICD-10-CM | POA: Diagnosis present

## 2017-03-16 DIAGNOSIS — I255 Ischemic cardiomyopathy: Secondary | ICD-10-CM | POA: Insufficient documentation

## 2017-03-16 DIAGNOSIS — C4442 Squamous cell carcinoma of skin of scalp and neck: Secondary | ICD-10-CM | POA: Diagnosis present

## 2017-03-16 DIAGNOSIS — I480 Paroxysmal atrial fibrillation: Secondary | ICD-10-CM | POA: Insufficient documentation

## 2017-03-16 DIAGNOSIS — E785 Hyperlipidemia, unspecified: Secondary | ICD-10-CM | POA: Diagnosis not present

## 2017-03-16 DIAGNOSIS — Z931 Gastrostomy status: Secondary | ICD-10-CM | POA: Diagnosis not present

## 2017-03-16 DIAGNOSIS — E871 Hypo-osmolality and hyponatremia: Secondary | ICD-10-CM | POA: Insufficient documentation

## 2017-03-16 DIAGNOSIS — D649 Anemia, unspecified: Secondary | ICD-10-CM | POA: Insufficient documentation

## 2017-03-16 DIAGNOSIS — J432 Centrilobular emphysema: Secondary | ICD-10-CM | POA: Diagnosis not present

## 2017-03-16 DIAGNOSIS — I252 Old myocardial infarction: Secondary | ICD-10-CM | POA: Diagnosis not present

## 2017-03-16 LAB — COMPREHENSIVE METABOLIC PANEL
ALK PHOS: 85 U/L (ref 38–126)
ALT: 18 U/L (ref 14–54)
AST: 21 U/L (ref 15–41)
Albumin: 3.8 g/dL (ref 3.5–5.0)
Anion gap: 7 (ref 5–15)
BILIRUBIN TOTAL: 0.3 mg/dL (ref 0.3–1.2)
BUN: 32 mg/dL — ABNORMAL HIGH (ref 6–20)
CO2: 35 mmol/L — ABNORMAL HIGH (ref 22–32)
CREATININE: 0.88 mg/dL (ref 0.44–1.00)
Calcium: 9.7 mg/dL (ref 8.9–10.3)
Chloride: 88 mmol/L — ABNORMAL LOW (ref 101–111)
Glucose, Bld: 103 mg/dL — ABNORMAL HIGH (ref 65–99)
Potassium: 4.4 mmol/L (ref 3.5–5.1)
Sodium: 130 mmol/L — ABNORMAL LOW (ref 135–145)
TOTAL PROTEIN: 7 g/dL (ref 6.5–8.1)

## 2017-03-16 LAB — CBC WITH DIFFERENTIAL/PLATELET
Basophils Absolute: 0 10*3/uL (ref 0.0–0.1)
Basophils Relative: 0 %
Eosinophils Absolute: 0.1 10*3/uL (ref 0.0–0.7)
Eosinophils Relative: 1 %
HCT: 31 % — ABNORMAL LOW (ref 36.0–46.0)
HEMOGLOBIN: 10.2 g/dL — AB (ref 12.0–15.0)
LYMPHS ABS: 0.5 10*3/uL — AB (ref 0.7–4.0)
LYMPHS PCT: 8 %
MCH: 30.9 pg (ref 26.0–34.0)
MCHC: 32.9 g/dL (ref 30.0–36.0)
MCV: 93.9 fL (ref 78.0–100.0)
MONOS PCT: 11 %
Monocytes Absolute: 0.7 10*3/uL (ref 0.1–1.0)
Neutro Abs: 4.9 10*3/uL (ref 1.7–7.7)
Neutrophils Relative %: 80 %
Platelets: 241 10*3/uL (ref 150–400)
RBC: 3.3 MIL/uL — ABNORMAL LOW (ref 3.87–5.11)
RDW: 13.1 % (ref 11.5–15.5)
WBC: 6.1 10*3/uL (ref 4.0–10.5)

## 2017-03-16 LAB — TSH: TSH: 3.807 u[IU]/mL (ref 0.350–4.500)

## 2017-03-16 MED ORDER — OXYCODONE HCL 5 MG/5ML PO SOLN: 5.0000 mg | ORAL | 0 refills | Status: AC | PRN

## 2017-03-16 NOTE — Progress Notes (Unsigned)
Referral sent to Hospice. Records

## 2017-03-16 NOTE — Patient Instructions (Signed)
[facility] Discharge Instructions  RECOMMENDATIONS MADE BY THE CONSULTANT AND ANY TEST RESULTS WILL BE SENT TO YOUR REFERRING PHYSICIAN.  You were seen today by Dr. Zhou.   Thank you for choosing [facility] at [facility] to provide your oncology and hematology care.  To afford each patient quality time with our provider, please arrive at least 15 minutes before your scheduled appointment time.    If you have a lab appointment with the Cancer Center please come in thru the  Main Entrance and check in at the main information desk  You need to re-schedule your appointment should you arrive 10 or more minutes late.  We strive to give you quality time with our providers, and arriving late affects you and other patients whose appointments are after yours.  Also, if you no show three or more times for appointments you may be dismissed from the clinic at the providers discretion.     Again, thank you for choosing [facility].  Our hope is that these requests will decrease the amount of time that you wait before being seen by our physicians.       _____________________________________________________________  Should you have questions after your visit to [facility], please contact our office at [phone] between the hours of 8:30 a.m. and 4:30 p.m.  Voicemails left after 4:30 p.m. will not be returned until the following business day.  For prescription refill requests, have your pharmacy contact our office.       Resources For Cancer Patients and their Caregivers ? American Cancer Society: Can assist with transportation, wigs, general needs, runs Look Good Feel Better.        1-888-227-6333 ? Cancer Care: Provides financial assistance, online support groups, medication/co-pay assistance.  1-800-813-HOPE (4673) ? [facility] Assists [place] cancer patients and their families  through emotional , educational and financial support.  336-427-4357 ? [place] DSS Where to apply for food stamps, Medicaid and utility assistance. 336-342-1394 ? RCATS: Transportation to medical appointments. 336-347-2287 ? Social Security Administration: May apply for disability if have a Stage IV cancer. 336-342-7796 1-800-772-1213 ? [place] Aging, Disability and Transit Services: Assists with nutrition, care and transit needs. 336-349-2343  Cancer Center Support Programs: @10RELATIVEDAYS@ > Cancer Support Group  2nd Tuesday of the month 1pm-2pm, Journey Room  > Creative Journey  3rd Tuesday of the month 1130am-1pm, Journey Room  > Look Good Feel Better  1st Wednesday of the month 10am-12 noon, Journey Room (Call [employer] to register 1-800-395-5775)    

## 2017-03-16 NOTE — Progress Notes (Signed)
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 Benjamine Mola 12/21/2015 No obvious primary lesion noted on panendoscopy examination CT neck 12/17/2015 large infiltrative tumor mass extends to skin surface, infiltrates R patorid, R submandibular, R carotid sheath,Thrombosis of R IJ vein, multiple satellite nodules Centrilobular emphysema P16 negative Tobacco Abuse Port a cath/FT placement on 01/17/2016 Multiple dental extractions with Dr. Enrique Sack 01/13/3016    Squamous cell carcinoma of head and neck ([place])   12/17/2015 Imaging    CT neck- large infiltrative tumor mass extends to skin surface, infiltrates R patorid, R submandibular, R carotid sheath,Thrombosis of R IJ vein, multiple satellite nodules      12/21/2015 Procedure    Soft tissue, biopsy, right neck mass by Dr. Benjamine Mola  Direct laryngoscopy, rigid esophagoscopy, and bronchoscopy       12/21/2015 Pathology Results    - SQUAMOUS CELL CARCINOMA.  The carcinoma appears moderately to poorly differentiated.  P16 NEGATIVE.      12/28/2015 Initial Diagnosis    Squamous cell carcinoma of head and neck ([place])      01/03/2016 Imaging    CT CAP- Large right-sided neck mass. No evidence for lymphadenopathy or metastatic disease in the chest, abdomen, or pelvis. 9 mm ground-glass nodule in the posterior right lung apex is probably related to scarring given the underlying emphysema      01/04/2016 Imaging    Bone scan- No evidence of metastatic disease.      01/17/2016 Procedure    IR port and feeding tube placed      01/19/2016 PET scan    The dominant right neck mass is  hypermetabolic. In addition, there are hypermetabolic smaller cervical lymph nodes bilaterally.  No discrete lesion of the pharyngeal mucosal space identified. The dominant right neck mass and its hypermetabolic activi...      01/31/2016 -  Chemotherapy    Cisplatin      02/01/2016 - 03/28/2016 Radiation Therapy    70 Gy by Dr. Isidore Moos.      03/03/2016 - 03/10/2016 Hospital Admission    NSTEMI, ARF, CHF, Afib with RVR, respiratory failure with hypoxia      06/26/2016 PET scan    At least a significant partial metabolic response. Infiltrative mildly hypermetabolic right lateral neck mass is significantly decreased in size and metabolism. The residual mild hypermetabolism within the infiltrative right lateral neck mass is nonspecific and could represent post treatment change versus residual tumor. No additional residual hypermetabolic lymph nodes in the neck. No new hypermetabolic metastatic disease. New 3 mm right lower lobe pulmonary nodule, nonspecific and below PET resolution.       HISTORY OF PRESENTING ILLNESS:  Gina Frederick 61 y.o. 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   Ms. Newmann is accompanied by a caretaker today.  Patient went to [facility] on 03/15/17 and had a PET scan performed which demonstrated increased FDG uptake in the right lateral neck metastatic nodal conglomerate measuring 5 x 5.5 cm with SUV max of 5.4, showing central photopenia denoting necrosis. Local invasion is better demonstrated on recent CT neck 01/29/2017. Hypermetabolic left supraclavicular lymph node measures 2 cm with SUV  max of 4.7, superficial focal FDG uptake in the right supraclavicular region. No abnormal FDG uptake is seen in the face, orbits, sinuses, oral cavity, or thyroid. Increased FDG uptake in the right lower lobe 1.3 cm lung nodule with SUV max of 4.1   She saw Dr. Melven Sartorius for a second opinion yesterday as well at [facility], and  was told that she is not a surgical candidate. There's concern whether this pulm nodule is metastatic disease from her malignancy vs. A new lung primary.  She recommended palliative chemotherapy with carbo/taxol/cetuximab.   The patient reports she is not interested in surgery or chemotherapy based on her health condition. The patient is interested in Hospice services. She presents in a wheelchair today and is using O2 via nasal cannula.  The patient reports intermittent throbbing pain in her right neck for which she uses Tylenol without much relief. She denies chest pain, breathing concerns, or abdominal pain. She reports she is eating well, though swallowing is difficult. She has PEG tube feedings with Osmolite regularly.   MEDICAL HISTORY:  Past Medical History:  Diagnosis Date  . Arthritis   . Centrilobular emphysema ([place])   . Edentulous    Multiple dental extractions March 2017  . Essential hypertension   . Hyperlipidemia   . Ischemic cardiomyopathy    LVEF 40-45%  . NSTEMI (non-ST elevated myocardial infarction) [facility])    April 2017 - managed medically due to comorbid illnesses  . PAF (paroxysmal atrial fibrillation) ([place])   . 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 Whitney Muse    SURGICAL HISTORY: Past Surgical History:  Procedure Laterality Date  . CESAREAN SECTION    . IR GENERIC HISTORICAL  08/07/2016   IR PATIENT EVAL TECH 0-60 MINS [facility]-INTERV RAD  . KNEE ARTHROSCOPY Right   . MASS BIOPSY Right 12/21/2015   Procedure: NECK MASS BIOPSY;  Surgeon: Leta Baptist, MD;  Location: [place];  Service: ENT;  Laterality: Right;  . MULTIPLE EXTRACTIONS WITH ALVEOLOPLASTY N/A 01/13/2016   Procedure: EXTRACTION OF TOOTH #'S 6-12,15, 19-28, 30 WITH ALVEOLOPLASTY AND BILATERAL MAXILLARY TUBEROSITY REDUCTIONS;  Surgeon: Lenn Cal, DDS;  Location: [facility];  Service: Oral Surgery;  Laterality: N/A;  . PANENDOSCOPY N/A 12/21/2015   Procedure:  PANENDOSCOPY;  Surgeon: Leta Baptist, MD;  Location: [place];  Service: ENT;  Laterality: N/A;  . TONSILLECTOMY      SOCIAL HISTORY: Social History   Social History  . Marital status: Divorced    Spouse name: N/A  . Number of children: 1  . Years of education: N/A   Occupational History  . Care giver    Social History Main Topics  . Smoking status: Current Some Day Smoker    Packs/day: 0.10    Years: 20.00    Types: Cigarettes  . Smokeless tobacco: Never Used     Comment: says down to 3 ciggs pers day  . Alcohol use No  . Drug use: No  . Sexual activity: No   Other Topics Concern  . Not on file   Social History Narrative   One child in prison.   She is divorced. One daughter, no grandchildren Smokes, notes that she is down to 3 a day. Began smoking "probably in her 50's." She says she has quit "on and off through the years." Denies problems with EtOH. 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 Quit working when her daughter was "of age to go to school," then stayed home with her daughter. For hobbies, she says she likes to read.  Is currently the caregiver for a lady with diabetes.  She does light housekeeping and keeps her on her medications, and cooks. The son of the woman she cares for, Herbie Baltimore, notes that her good cooking has brought his momma's blood pressure and blood sugar down.  She notes she has a good support system with her sister, and with the son of the woman she takes care of.  FAMILY HISTORY: Family History  Problem Relation Age of Onset  . Alzheimer's disease Mother   . Congestive Heart Failure Father 38   indicated that her mother is deceased. She indicated that her father is deceased.  Mother and father are deceased. Mother was 90; died of Alzheimer's. Father was 65; congestive heart failure. Has a sister, age 35 this year. Had a brother, deceased. He  took his own life. 4 nieces and nephews.  ALLERGIES:  is allergic to codeine.  MEDICATIONS:  Current Outpatient Prescriptions  Medication Sig Dispense Refill  . acetaminophen (TYLENOL) 500 MG tablet Take 1,000 mg by mouth every 6 (six) hours as needed for mild pain or moderate pain.     Marland Kitchen apixaban (ELIQUIS) 5 MG TABS tablet Take 1 tablet (5 mg total) by mouth 2 (two) times daily. 60 tablet 3  . atorvastatin (LIPITOR) 40 MG tablet Take 1 tablet (40 mg total) by mouth daily at 6 PM. 30 tablet 3  . diphenhydrAMINE (BENADRYL) 25 MG tablet Take 25 mg by mouth every 6 (six) hours as needed. Reported on 03/13/2016    . feeding supplement, ENSURE ENLIVE, (ENSURE ENLIVE) LIQD Take 237 mLs by mouth 2 (two) times daily between meals. 237 mL 0  . furosemide (LASIX) 40 MG tablet Take 1 tablet (40 mg total) by mouth daily. 30 tablet 0  . guaiFENesin (MUCINEX) 600 MG 12 hr tablet Take by mouth 2 (two) times daily.    Marland Kitchen levothyroxine (SYNTHROID) 75 MCG tablet Take 1 tablet (75 mcg total) by mouth daily before breakfast. 30 tablet 3  . lidocaine-prilocaine (EMLA) cream Apply a quarter size amount to port site 1 hour prior to chemo. Do not rub in. Cover with plastic wrap. 30 g 3  . lisinopril (PRINIVIL,ZESTRIL) 5 MG tablet Take 1 tablet (5 mg total) by mouth daily. 30 tablet 0  . LORazepam (ATIVAN) 0.5 MG tablet Take 1 tablet (0.5 mg total) by mouth every 4 (four) hours as needed for anxiety (one tab every 4-6 hours as needed for nausea and vomiting). 45 tablet 1  . Multiple Vitamins-Minerals (MULTIVITAMIN WITH MINERALS) tablet Take 1 tablet by mouth daily.    . nitroGLYCERIN (NITROSTAT) 0.4 MG SL tablet Place 1 tablet (0.4 mg total) under the tongue every 5 (five) minutes x 3 doses as needed for chest pain. 15 tablet 0  . ondansetron (ZOFRAN-ODT) 4 MG disintegrating tablet TAKE ONE TABLET BY MOUTH EVERY 8 HOURS. AS NEEDED FOR NAUSEA AND VOMITING 20 tablet 3  . OXYGEN Inhale into the lungs. 2L    . potassium  chloride (KLOR-CON) 20 MEQ packet Take by mouth 2 (two) times daily.    . prochlorperazine (COMPAZINE) 10 MG tablet Take 1 tablet (10 mg total) by mouth every 6 (six) hours as needed for nausea or vomiting. 30 tablet 2   No current facility-administered medications for this visit.    Facility-Administered Medications Ordered  in Other Visits  Medication Dose Route Frequency Provider Last Rate Last Dose  . sodium polystyrene (KAYEXALATE) 15 GM/60ML suspension 30 g  30 g Oral Once Kefalas, Thomas S, PA-C        Review of Systems  Constitutional: Positive for weight loss (13 lbs since 06/28/2016).  HENT: Positive for congestion and sore throat (more sore at night).        Hoarse voice. Difficulty swallowing secondary to sore throat "Throbbing" pain to head and face  Eyes: Negative.   Respiratory: Negative for hemoptysis, shortness of breath and wheezing.   Cardiovascular: Negative for chest pain.  Gastrointestinal: Negative for abdominal pain.  Genitourinary: Negative.   Musculoskeletal: Negative for falls and myalgias.  Skin: Negative.   Endo/Heme/Allergies: Negative.   All other systems reviewed and are negative. 14 point ROS was done and is otherwise as detailed above or in HPI   PHYSICAL EXAMINATION: ECOG PERFORMANCE STATUS: 2 - Symptomatic, <50% confined to bed   Vitals:   03/16/17 1459  BP: 111/65  Pulse: (!) 111  Resp: 16  Temp: 99.1 F (37.3 C)    Physical Exam  Constitutional: She is oriented to person, place, and time and well-developed, well-nourished, and in no distress.  Wears glasses.   HENT:  Head: Normocephalic and atraumatic.  Nose: Nose normal.     Eyes: Conjunctivae and EOM are normal. Pupils are equal, round, and reactive to light.  Neck: Normal range of motion. Neck supple.  Fungating right neck mass TTP  Cardiovascular: Normal rate, regular rhythm and normal heart sounds.  Exam reveals no gallop and no friction rub.   No murmur  heard. Pulmonary/Chest: Effort normal and breath sounds normal. No respiratory distress. She has no wheezes. She has no rales. She exhibits no tenderness.  O2 via nasal cannula  Abdominal: Soft. Bowel sounds are normal. She exhibits no distension and no mass. There is no tenderness. There is no rebound and no guarding.  PEG tube C/D/I  Musculoskeletal: Normal range of motion. She exhibits no edema.  Neurological: She is alert and oriented to person, place, and time. She has normal reflexes. No cranial nerve deficit. Gait normal. Coordination normal.  Skin: Skin is warm and dry. No rash noted.  Psychiatric: Mood, memory, affect and judgment normal.  Nursing note and vitals reviewed.   LABORATORY DATA:  I have reviewed the data as listed  Lab Results  Component Value Date   WBC 6.1 03/16/2017   HGB 10.2 (L) 03/16/2017   HCT 31.0 (L) 03/16/2017   MCV 93.9 03/16/2017   PLT 241 03/16/2017   CMP     Component Value Date/Time   NA 130 (L) 03/16/2017 1426   K 4.4 03/16/2017 1426   CL 88 (L) 03/16/2017 1426   CO2 35 (H) 03/16/2017 1426   GLUCOSE 103 (H) 03/16/2017 1426   BUN 32 (H) 03/16/2017 1426   CREATININE 0.88 03/16/2017 1426   CALCIUM 9.7 03/16/2017 1426   PROT 7.0 03/16/2017 1426   ALBUMIN 3.8 03/16/2017 1426   AST 21 03/16/2017 1426   ALT 18 03/16/2017 1426   ALKPHOS 85 03/16/2017 1426   BILITOT 0.3 03/16/2017 1426   GFRNONAA >60 03/16/2017 1426   GFRAA >60 03/16/2017 1426     RADIOGRAPHIC STUDIES: I have personally reviewed the radiological images as listed and agreed with the findings in the report.  PET [facility] OF SKIN CA OR BONE METS (W/LOW DOSE CT), 03/15/2017 2:38 PM  INDICATION: squamous cell skin cancer \  C44.92 Squamous cell skin cancer  COMPARISON: CT neck and chest 01/29/2017.  TECHNIQUE: 75 minutes after the intravenous injection of 15.85 mCi F-18 FDG, images were obtained from the skull base through the thighs. These images were attenuation corrected  using CT. Standardized uptake values (SUV) were calculated using a lean body mass algorithm. Blood glucose at the time of injection was 99 mg/dL.  [facility] and its affiliates are committed to minimizing radiation dose to patients while maintaining necessary diagnostic image quality. All CT scans are therefore performed using "As Low As Reasonably Achievable (ALARA)" protocols with either manual or automated exposure controls calibrated to the age and size of each patient.  SUVmax of blood pool: 1.4 SUVmax of liver: 2  LIMITATIONS: The low-dose CT acquisition was performed only for attenuation correction/activity localization. There is no intravenous contrast, further limiting the CT component of the study. This modality has limited utility for detection or characterization of small lung nodules. Evaluation of the vasculature is limited by lack of IV contrast. Evaluation of the kidney, ureters, and bladder are limited by urinary excretion of radiotracer. Physiologic bowel uptake of FDG and lack of CT contrast limit evaluation of the bowel.   FINDINGS:   HEAD and NECK: Increased FDG uptake in the right lateral neck metastatic nodal conglomerate measuring 5 x 5.5 cm with SUV max of 5.4 (image 68), showing central photopenia denoting necrosis. Local invasion is better demonstrated on recent CT neck 01/29/2017. Hypermetabolic left supraclavicular lymph node measures 2 cm with SUV max of 4.7 (image 78).  Superficial focal FDG uptake in the right supraclavicular region (image 85).  No abnormal FDG uptake is seen in the face, orbits, sinuses, oral cavity, or thyroid. Ancillary head and neck CT findings: Left IJ approach port a catheter with its tip terminating at the cavoatrial junction.  CHEST: Increased FDG uptake in the right lower lobe 1.3 cm lung nodule with SUV max of 4.1 (image 116).  Myocardial apex photopenia likely correlate to patient's history of No  abnormal FDG uptake is seen in the heart, pleura, esophagus, hila/mediastinum, axilla, or breasts. Ancillary chest CT findings: Emphysema. Dependent atelectasis.  ABDOMEN/PELVIS: No abnormal FDG uptake is seen in the liver, spleen, gallbladder, pancreas, adrenals, peritoneum, extraperitoneum, nodes, or reproductive tract. Ancillary abdomen and pelvis CT findings: Cholelithiasis. Gastrostomy tube in position with minimal surrounding FDG uptake.  MUSCULOSKELETAL: No abnormal FDG uptake is seen in the soft tissues or bones. Ancillary musculoskeletal CT findings: None.   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 Benjamine Mola 12/21/2015 No obvious primary lesion noted on panendoscopy examination CT neck 12/17/2015 large infiltrative tumor mass extends to skin surface, infiltrates R patorid, R submandibular, R carotid sheath,Thrombosis of R IJ vein, multiple satellite nodules Centrilobular emphysema P16 negative Tobacco Use Tobacco Abuse Port a cath/FT placement on 01/17/2016 Multiple dental extractions with Dr. Enrique Sack 01/13/3016 Hyponatremia HTN NSTEMI Macrocytosis, normal B12, folate Anemia Hypothyroidism.  PLAN: I have reviewed her PET scan results as well as Dr. Dr. Horace Porteous recommendations for palliative chemotherapy. However patient states that she is not interested in any more treatment including chemo, she would like hospice instead.   I will refer the patient to Hospice services per her request. I will turn over all care to hospice.  I will prescribe liquid oxycodone today for pain control.  The patient may follow up with me on an as  needed basis. I will be happy to see her back in the clinic if indicated.   All questions were answered. The patient knows to call the clinic with any problems, questions or  concerns.  This document serves as a record of services personally performed by Twana First, MD. It was created on her behalf by Maryla Morrow, a trained medical scribe. The creation of this record is based on the scribe's personal observations and the provider's statements to them. This document has been checked and approved by the attending provider.  I have reviewed the above documentation for accuracy and completeness, and I agree with the above.  This note was electronically signed by:  Twana First, MD 03/16/2017 3:16 PM

## 2017-04-12 ENCOUNTER — Other Ambulatory Visit: Payer: Self-pay

## 2017-04-20 ENCOUNTER — Ambulatory Visit: Payer: Self-pay | Admitting: Cardiology

## 2017-05-01 ENCOUNTER — Telehealth: Payer: Self-pay

## 2017-05-01 NOTE — Telephone Encounter (Signed)
Attempted to contact patient to reschedule echo that was cancelled. We do not have the correct number for patient. The number we have listed was just a temporary number

## 2017-05-06 DEATH — deceased

## 2017-10-13 ENCOUNTER — Other Ambulatory Visit: Payer: Self-pay | Admitting: Nurse Practitioner

## 2017-11-07 IMAGING — NM NM BONE WHOLE BODY
2 series · 2 of 2 positions shown · non-contrast
Comparison: None.

CLINICAL DATA: Head neck cancer.

EXAM:
NUCLEAR MEDICINE WHOLE BODY BONE SCAN
TECHNIQUE: Whole body anterior and posterior images were obtained approximately
3 hours after intravenous injection of radiopharmaceutical.
RADIOPHARMACEUTICALS:  CTs 01/03/2016 . MCi Kechnetium-TTm MDP IV

[Series 1: whole body · 2.66mm/px · 1 of 1 slices shown (1 of 2)]
[im 1/1]
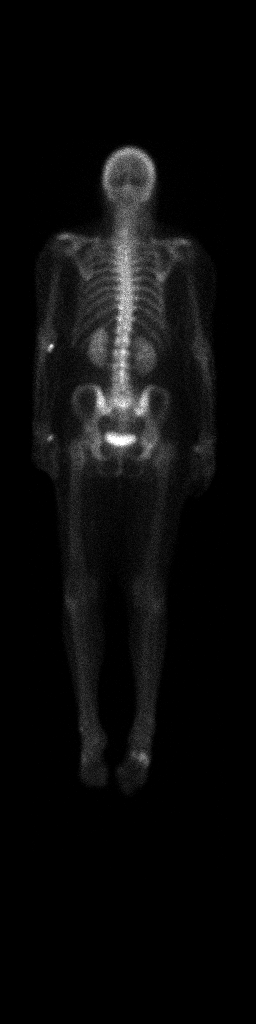

[Series 1: whole body · 2.66mm/px · 1 of 1 slices shown (2 of 2)]
[im 1/1]
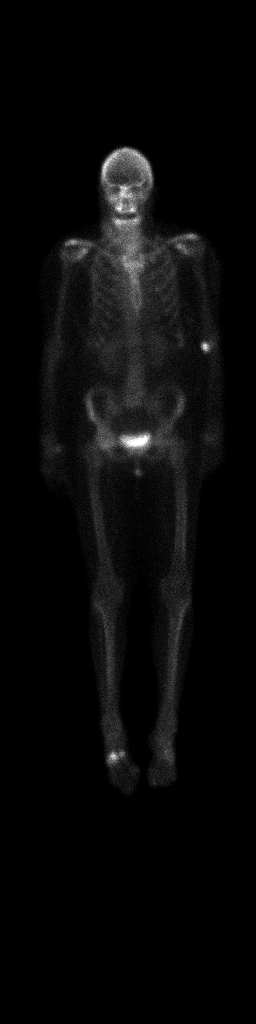

[2 of 2 positions shown; findings below may reference images not displayed]

FINDINGS: Bilateral renal function and excretion. No acute bony abnormality
identified. Increased activity noted over the right foot, clinical
correlation suggested. These changes could be degenerative. No
evidence of metastatic disease.
IMPRESSION: 1. No evidence of metastatic disease.

2. Increased activity noted over the right foot, clinical
correlation suggested. These changes may be degenerative .

## 2017-11-22 IMAGING — PT NM PET TUM IMG INITIAL (PI) SKULL BASE T - THIGH
7 series · 25 of 25 positions shown · non-contrast
Comparison: CTs of the neck, 12/17/2015, and chest, abdomen and
pelvis, 01/02/2015. Bone scan 01/03/2015.

CLINICAL DATA: Initial treatment strategy for squamous cell
carcinoma of the right neck.

EXAM:
NUCLEAR MEDICINE PET SKULL BASE TO THIGH
TECHNIQUE: 7.61 mCi F-18 FDG was injected intravenously. Full-ring PET imaging
was performed from the skull base to thigh after the radiotracer. CT
data was obtained and used for attenuation correction and anatomic
localization.
FASTING BLOOD GLUCOSE:  Value: 98 mg/dl

[Series 3: pet hn_sk_thigh ac · axial · 5.0mm · 4.07mm/px · z∈[-876,-12]mm · 6 of 217 slices shown]
[im 1/217]
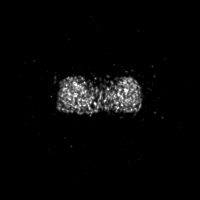
[im 44/217]
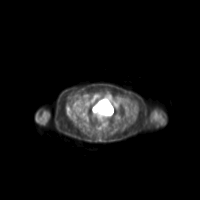
[im 87/217]
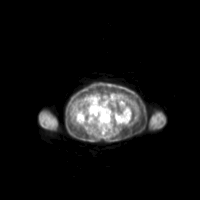
[im 130/217]
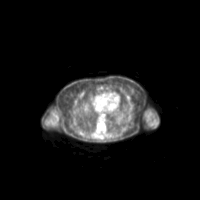
[im 173/217]
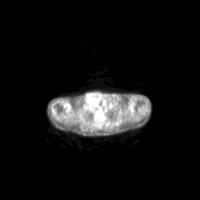
[im 217/217]
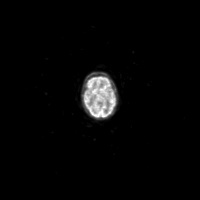

[Series 4: ct hn_sk_th 5.0 b31f · axial · 5.0mm · 0.98mm/px · z∈[-876,-12]mm · 6 of 217 slices shown]
[im 1/217]
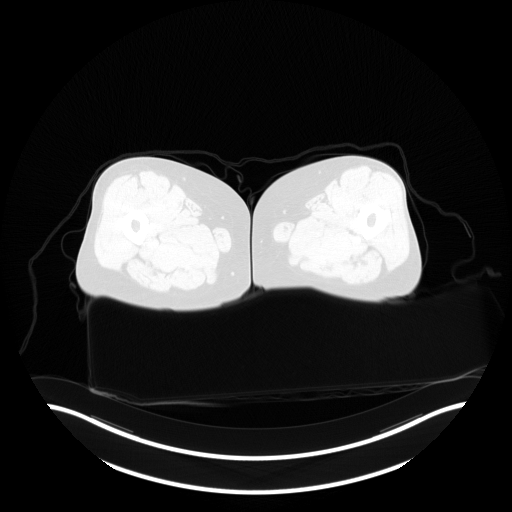
[im 44/217]
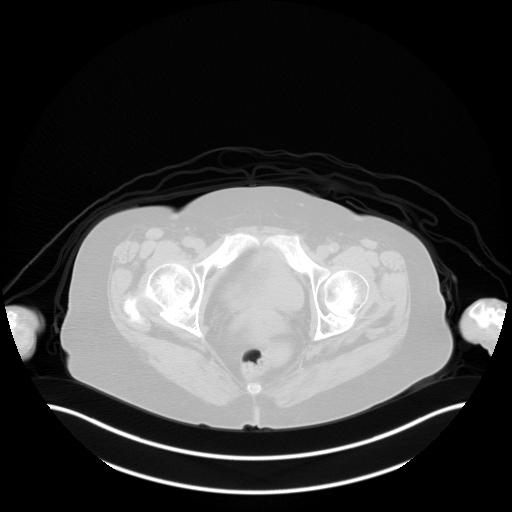
[im 87/217]
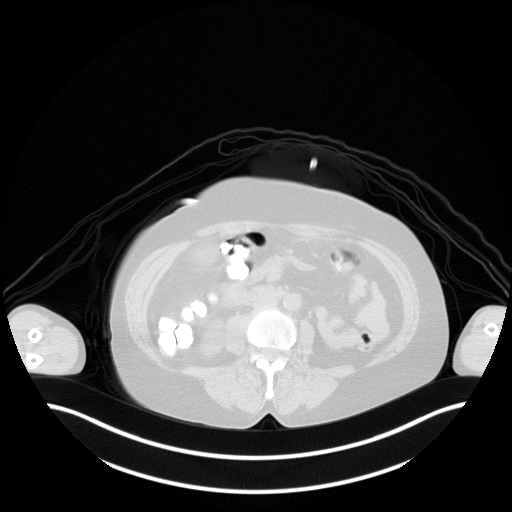
[im 130/217]
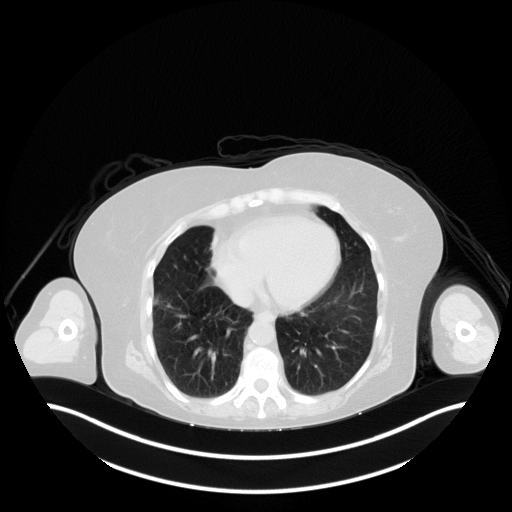
[im 173/217]
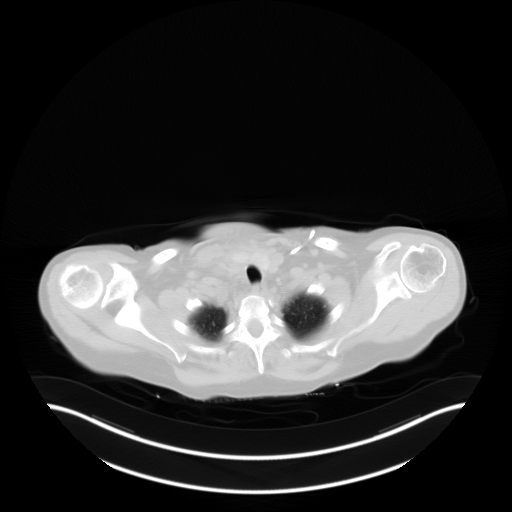
[im 217/217  brain]
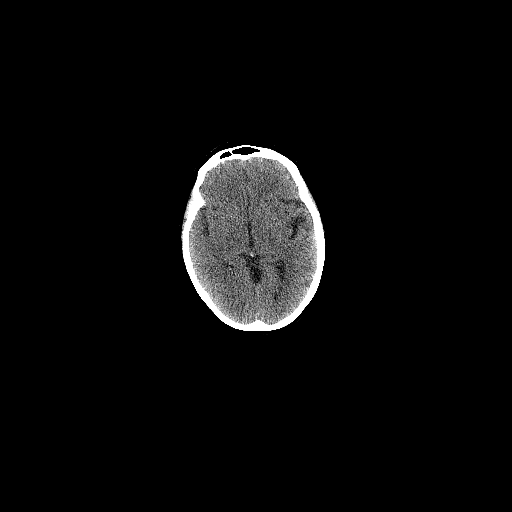

[Series 6: ct hn_sk_th 5.0 b70f lung_bone · axial · 5.0mm · 0.64mm/px · z∈[-420,-180]mm · 2 of 61 slices shown]
[im 1/61  bone]
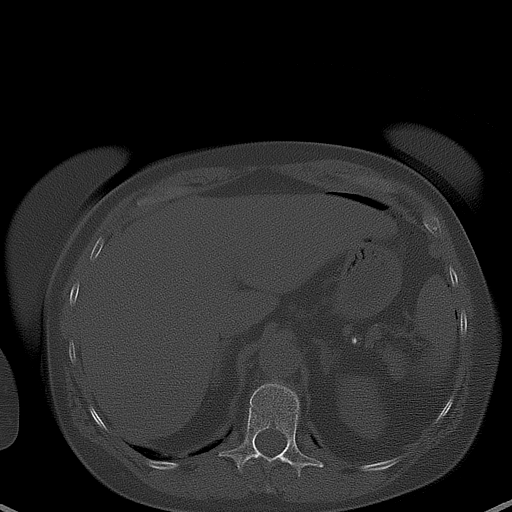
[im 61/61  bone]
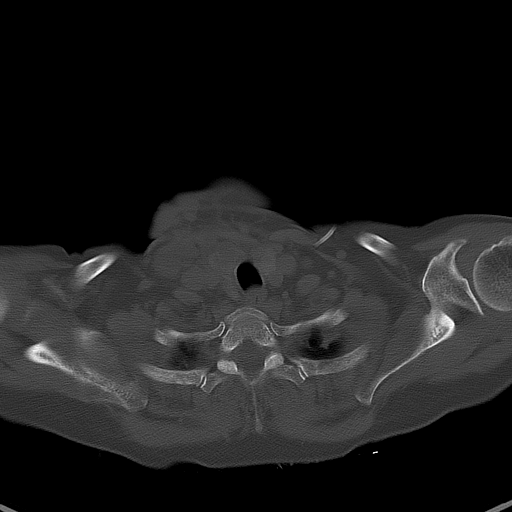

[Series 8: pet hn_sk_thigh nac · axial · 5.0mm · 4.07mm/px · z∈[-876,-12]mm · 7 of 217 slices shown]
[im 1/217]
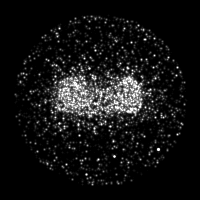
[im 37/217]
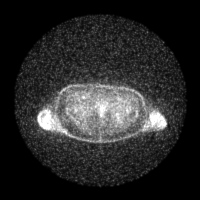
[im 73/217]
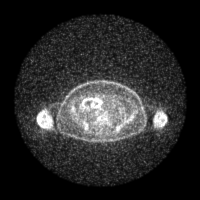
[im 109/217]
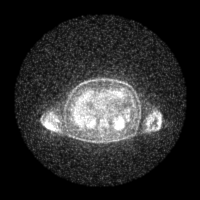
[im 145/217]
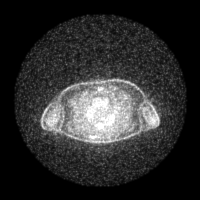
[im 181/217]
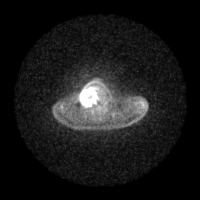
[im 217/217]
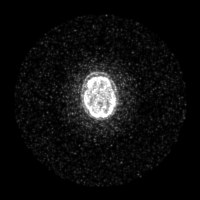

[Series 603: mip collection<mip range> · coronal · 1.79mm/px · 1 of 32 slices shown]
[im 1/32]
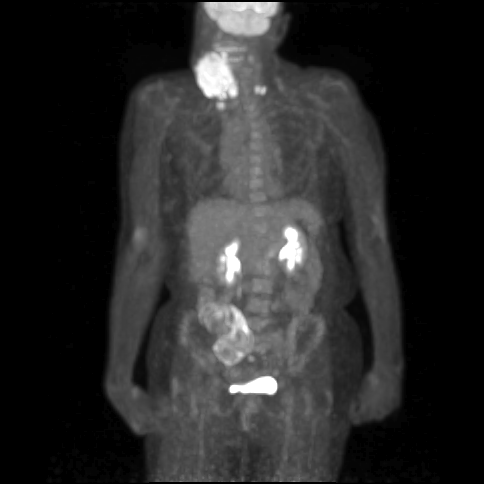

[Series 604: range-ct hn_sk_th 5.0 (id)<alpha range> · 2 of 63 slices shown]
[im 1/63]
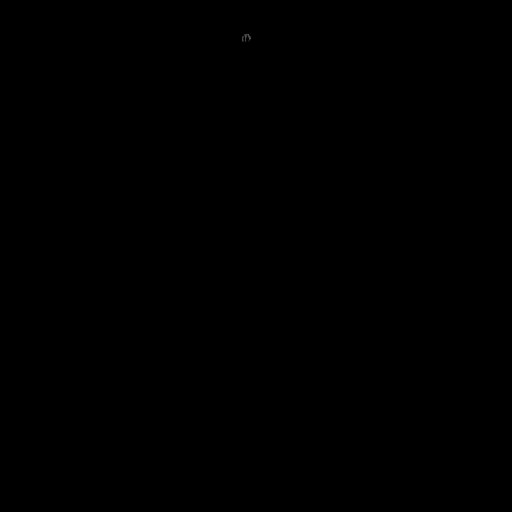
[im 63/63]
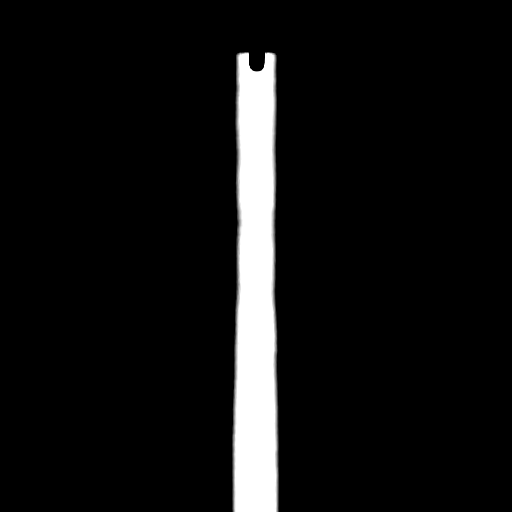

[Series 1032: results mm oncology reading · 1.05mm/px · 1 of 3 slices shown]
[im 1/3]
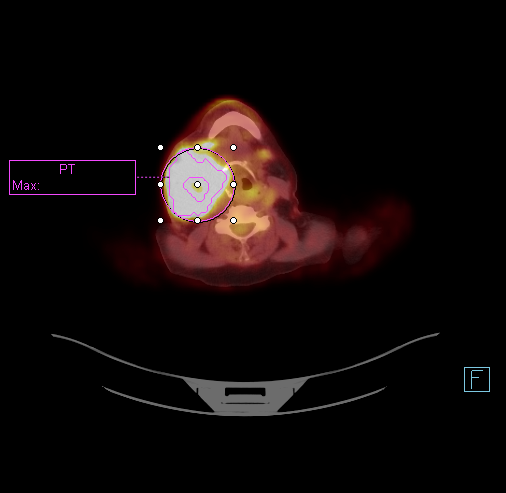

[25 of 25 positions shown; findings below may reference images not displayed]

FINDINGS: NECK

The dominant right neck mass is markedly hypermetabolic with an SUV
max of 20.7. This mass measures approximately 7.0 x 6.8 cm on image
36. There are multiple additional hypermetabolic cervical lymph
nodes bilaterally. These include a right level 4 node measuring 14
mm on image 43 (SUV max 9.9), and small left-sided level 2 (10 mm on
image 28) and level 4 nodes on image 41 (measuring 7 and 10 mm, SUV
max 11.1). The dominant right neck mass appears contiguous with the
right lobe of the thyroid and maybe invading it.No lesions of the
pharyngeal mucosal space are identified. There is stable mass effect
on the larynx which is displaced to the left.

CHEST

There are no hypermetabolic mediastinal, hilar or axillary lymph
nodes. There is no suspicious pulmonary activity. Emphysema is
noted. The small ground-glass nodule previously noted in the right
upper lobe measures 6 mm on image number 6, too small to evaluate by
PET-CT.

ABDOMEN/PELVIS

There is no hypermetabolic activity within the liver, adrenal
glands, spleen or pancreas. There is no hypermetabolic nodal
activity. There is physiologic activity within the left anterior
abdominal wall surrounding a percutaneous G-tube. There is a small
amount of extraluminal air within the left upper quadrant of the
abdomen and in the anterior abdominal wall, attributed to this
G-tube which was placed 2 days ago. Cholelithiasis, mild aortoiliac
atherosclerosis and sigmoid diverticulosis are noted.

SKELETON

There is no hypermetabolic activity to suggest osseous metastatic
disease.
IMPRESSION: 1. The dominant right neck mass is hypermetabolic. In addition,
there are hypermetabolic smaller cervical lymph nodes bilaterally.
2. No discrete lesion of the pharyngeal mucosal space identified.
The dominant right neck mass and its hypermetabolic activity are
contiguous with the right thyroid lobe and may be invading it.
3. No suspicious activity within the chest, abdomen or pelvis.
4. Small ground-glass nodule in the right upper lobe is too small to
evaluate by PET-CT. Attention on follow-up recommended.
5. Small amount of extraluminal air in the upper abdomen attributed
to recent G-tube placement.

## 2022-02-24 ENCOUNTER — Other Ambulatory Visit: Payer: Self-pay | Admitting: Nurse Practitioner
